# Patient Record
Sex: Female | Born: 1950 | Race: White | Hispanic: No | Marital: Married | Smoking: Never smoker
Health system: Southern US, Community
[De-identification: ages and names within clinical notes are randomized; demographics above are authoritative.]

## PROBLEM LIST (undated history)

## (undated) DIAGNOSIS — I4891 Unspecified atrial fibrillation: Secondary | ICD-10-CM

## (undated) DIAGNOSIS — I10 Essential (primary) hypertension: Secondary | ICD-10-CM

## (undated) DIAGNOSIS — I639 Cerebral infarction, unspecified: Secondary | ICD-10-CM

## (undated) DIAGNOSIS — C55 Malignant neoplasm of uterus, part unspecified: Secondary | ICD-10-CM

## (undated) DIAGNOSIS — E0590 Thyrotoxicosis, unspecified without thyrotoxic crisis or storm: Secondary | ICD-10-CM

## (undated) HISTORY — PX: CHOLECYSTECTOMY: SHX55

## (undated) HISTORY — PX: ABDOMINAL HYSTERECTOMY: SHX81

## (undated) HISTORY — DX: Thyrotoxicosis, unspecified without thyrotoxic crisis or storm: E05.90

## (undated) HISTORY — DX: Cerebral infarction, unspecified: I63.9

## (undated) HISTORY — PX: REPLACEMENT TOTAL KNEE BILATERAL: SUR1225

## (undated) SURGICAL SUPPLY — 1 items: CATH 8FR REPROCESSED SOUNDSTAR (CATHETERS) ×1 IMPLANT

---

## 2003-12-22 ENCOUNTER — Inpatient Hospital Stay (HOSPITAL_COMMUNITY): Admission: RE | Admit: 2003-12-22 | Discharge: 2003-12-26 | Payer: Self-pay | Admitting: Orthopedic Surgery

## 2004-07-14 ENCOUNTER — Encounter (INDEPENDENT_AMBULATORY_CARE_PROVIDER_SITE_OTHER): Payer: Self-pay | Admitting: Specialist

## 2004-07-14 ENCOUNTER — Ambulatory Visit (HOSPITAL_COMMUNITY): Admission: RE | Admit: 2004-07-14 | Discharge: 2004-07-15 | Payer: Self-pay | Admitting: Surgery

## 2004-07-27 ENCOUNTER — Inpatient Hospital Stay (HOSPITAL_COMMUNITY): Admission: EM | Admit: 2004-07-27 | Discharge: 2004-07-28 | Payer: Self-pay | Admitting: Emergency Medicine

## 2004-08-02 ENCOUNTER — Ambulatory Visit: Payer: Self-pay | Admitting: Internal Medicine

## 2004-08-14 ENCOUNTER — Ambulatory Visit: Payer: Self-pay | Admitting: Internal Medicine

## 2004-08-16 ENCOUNTER — Ambulatory Visit: Payer: Self-pay | Admitting: Endocrinology

## 2004-08-21 ENCOUNTER — Ambulatory Visit: Payer: Self-pay | Admitting: Internal Medicine

## 2004-08-30 ENCOUNTER — Ambulatory Visit: Payer: Self-pay

## 2004-09-12 ENCOUNTER — Ambulatory Visit: Payer: Self-pay | Admitting: Internal Medicine

## 2004-10-06 ENCOUNTER — Encounter: Admission: RE | Admit: 2004-10-06 | Discharge: 2004-10-06 | Payer: Self-pay | Admitting: Internal Medicine

## 2004-10-16 ENCOUNTER — Ambulatory Visit: Payer: Self-pay | Admitting: Internal Medicine

## 2004-10-18 ENCOUNTER — Ambulatory Visit: Payer: Self-pay | Admitting: Internal Medicine

## 2004-11-08 ENCOUNTER — Inpatient Hospital Stay (HOSPITAL_COMMUNITY): Admission: RE | Admit: 2004-11-08 | Discharge: 2004-11-12 | Payer: Self-pay | Admitting: Orthopedic Surgery

## 2004-12-27 ENCOUNTER — Ambulatory Visit: Payer: Self-pay | Admitting: Internal Medicine

## 2005-07-18 ENCOUNTER — Ambulatory Visit: Payer: Self-pay | Admitting: Internal Medicine

## 2005-11-20 ENCOUNTER — Ambulatory Visit: Payer: Self-pay | Admitting: Internal Medicine

## 2005-11-26 ENCOUNTER — Encounter (INDEPENDENT_AMBULATORY_CARE_PROVIDER_SITE_OTHER): Payer: Self-pay | Admitting: Specialist

## 2005-11-26 ENCOUNTER — Inpatient Hospital Stay (HOSPITAL_COMMUNITY): Admission: RE | Admit: 2005-11-26 | Discharge: 2005-11-30 | Payer: Self-pay | Admitting: Orthopedic Surgery

## 2006-06-27 ENCOUNTER — Ambulatory Visit: Payer: Self-pay | Admitting: Internal Medicine

## 2006-08-09 ENCOUNTER — Ambulatory Visit (HOSPITAL_COMMUNITY): Admission: RE | Admit: 2006-08-09 | Discharge: 2006-08-09 | Payer: Self-pay | Admitting: Internal Medicine

## 2006-08-28 ENCOUNTER — Ambulatory Visit: Payer: Self-pay | Admitting: Internal Medicine

## 2006-10-28 ENCOUNTER — Inpatient Hospital Stay (HOSPITAL_COMMUNITY): Admission: RE | Admit: 2006-10-28 | Discharge: 2006-10-31 | Payer: Self-pay | Admitting: Orthopedic Surgery

## 2006-10-28 ENCOUNTER — Encounter (INDEPENDENT_AMBULATORY_CARE_PROVIDER_SITE_OTHER): Payer: Self-pay | Admitting: Specialist

## 2007-02-13 ENCOUNTER — Ambulatory Visit: Payer: Self-pay | Admitting: Internal Medicine

## 2007-02-13 LAB — CONVERTED CEMR LAB
Free T4: 0.9 ng/dL (ref 0.6–1.6)
T3 Uptake Ratio: 37 % (ref 22.5–37.0)
T3, Free: 2.8 pg/mL (ref 2.3–4.2)
T4, Total: 7.9 ug/dL (ref 5.0–12.5)

## 2007-03-31 ENCOUNTER — Telehealth: Payer: Self-pay | Admitting: *Deleted

## 2007-05-16 DIAGNOSIS — M1990 Unspecified osteoarthritis, unspecified site: Secondary | ICD-10-CM | POA: Insufficient documentation

## 2007-05-16 DIAGNOSIS — I10 Essential (primary) hypertension: Secondary | ICD-10-CM | POA: Insufficient documentation

## 2007-05-16 DIAGNOSIS — Z8639 Personal history of other endocrine, nutritional and metabolic disease: Secondary | ICD-10-CM

## 2007-08-06 ENCOUNTER — Ambulatory Visit: Payer: Self-pay | Admitting: Internal Medicine

## 2007-08-06 LAB — CONVERTED CEMR LAB
CA 125: 12.3 units/mL (ref 0.0–30.2)
CEA: 0.8 ng/mL (ref 0.0–5.0)

## 2007-08-11 ENCOUNTER — Encounter: Payer: Self-pay | Admitting: *Deleted

## 2007-08-11 DIAGNOSIS — C549 Malignant neoplasm of corpus uteri, unspecified: Secondary | ICD-10-CM | POA: Insufficient documentation

## 2007-08-15 ENCOUNTER — Ambulatory Visit: Payer: Self-pay | Admitting: Internal Medicine

## 2007-08-15 LAB — CONVERTED CEMR LAB
BUN: 24 mg/dL — ABNORMAL HIGH (ref 6–23)
Creatinine, Ser: 1.3 mg/dL — ABNORMAL HIGH (ref 0.4–1.2)

## 2007-08-18 ENCOUNTER — Ambulatory Visit: Payer: Self-pay | Admitting: Cardiology

## 2007-08-20 ENCOUNTER — Ambulatory Visit: Admission: RE | Admit: 2007-08-20 | Discharge: 2007-08-20 | Payer: Self-pay | Admitting: Gynecologic Oncology

## 2007-08-22 ENCOUNTER — Ambulatory Visit: Payer: Self-pay | Admitting: Internal Medicine

## 2007-09-09 ENCOUNTER — Encounter (INDEPENDENT_AMBULATORY_CARE_PROVIDER_SITE_OTHER): Payer: Self-pay | Admitting: Gynecologic Oncology

## 2007-09-09 ENCOUNTER — Inpatient Hospital Stay (HOSPITAL_COMMUNITY): Admission: RE | Admit: 2007-09-09 | Discharge: 2007-09-11 | Payer: Self-pay | Admitting: Obstetrics and Gynecology

## 2007-10-29 ENCOUNTER — Ambulatory Visit: Admission: RE | Admit: 2007-10-29 | Discharge: 2007-10-29 | Payer: Self-pay | Admitting: Gynecologic Oncology

## 2008-02-11 ENCOUNTER — Ambulatory Visit: Admission: RE | Admit: 2008-02-11 | Discharge: 2008-02-11 | Payer: Self-pay | Admitting: Gynecologic Oncology

## 2008-02-11 ENCOUNTER — Other Ambulatory Visit: Admission: RE | Admit: 2008-02-11 | Discharge: 2008-02-11 | Payer: Self-pay | Admitting: Gynecologic Oncology

## 2008-02-11 ENCOUNTER — Encounter: Payer: Self-pay | Admitting: Gynecologic Oncology

## 2010-10-21 ENCOUNTER — Encounter: Payer: Self-pay | Admitting: Internal Medicine

## 2011-02-13 NOTE — Consult Note (Signed)
NAME:  Melissa, [name]             ACCOUNT NO.:  [id number]   MEDICAL RECORD NO.:  [id number]          PATIENT TYPE:  OUT   LOCATION:  GYN                          FACILITY:  [facility]   PHYSICIAN:  Paola A. Duard Brady, MD    DATE OF BIRTH:  01-Mar-1951   DATE OF CONSULTATION:  10/29/2007  DATE OF DISCHARGE:                                 CONSULTATION   REFERRING PHYSICIAN:  Paola A. Duard Brady, M.D.   The patient is a 60 year old gravida 1, para 1 who really never stopped  having her period.  She had 36 hours of heavy bleeding with clots in  September 2008.  She underwent an endometrial biopsy by Dr. Henderson Cloud that  revealed a grade 1 endometrioid adenocarcinoma.  On September 09, 2007 she  underwent diagnostic laparoscopy which was converted to exploratory  laparotomy as the patient could not tolerate deep Trendelenburg.  She  underwent a TAH-BSO and bilateral selective pelvic lymphadenectomy.  Frozen section revealed a grade 1 endometrioid adenocarcinoma with  minimal to no myometrial invasion.  Due to those findings and the  patient's obesity, it was elected to not perform a pelvic lymph node  dissection.  Final pathology was consistent with a superficially  invasive 5 cm grade 1 endometrioid adenocarcinoma with 0.2 cm out of 2.3  cm, or less than 10% myometrial invasion with no lymphovascular space  involvement.  Her cervix and bilateral tubes and ovaries were negative,  0/21 lymph nodes were involved, and her washings were negative.  She  comes in for her postoperative check.   She has been back at work for the past 3 weeks.  She did have a little  bit of vaginal drainage and discharge which was slightly pink but that  has now resolved.  She has no abdominal pain and is otherwise feeling  quite well.   PHYSICAL EXAMINATION:  Weight 270 pounds, well-nourished, well-developed  female in no acute distress.  ABDOMEN:  Shows well-healed vertical skin incision.  Abdomen is soft,  nontender,  nondistended, there are no palpable masses or  hepatosplenomegaly, though exam is limited by habitus.  PELVIC:  External genitalia is within normal limits.  The vagina is  slightly atrophic.  The vaginal cuff is visualized, somewhat limited due  to habitus, there are no gross visible lesions.  The vaginal cuff looks  well healed.  BIMANUAL EXAMINATION:  The vaginal cuff is nontender.   ASSESSMENT:  A 60 year old stage IB grade 1 endometrioid adenocarcinoma.  Preoperatively she had papillary features.  There was no evidence of a  serous component so she can be dispositioned to close followup.  She  will return to see [place] in 4 months for examination, Pap smear, interval  cancer surveillance.  We will then alternate with Dr. Henderson Cloud every 4  months.      Paola A. Duard Brady, MD  Electronically Signed     PAG/MEDQ  D:  10/29/2007  T:  10/30/2007  Job:  [id number]   cc:   Carrington Clamp, M.D.  Fax: [phone]   Stacie Glaze, MD  [address] [place]  [place]  [address]   Telford Nab, R.N.  [address]  [place], [place] [address]

## 2011-02-13 NOTE — Consult Note (Signed)
NAME:  Melissa [name], Melissa [name]             ACCOUNT NO.:  [id number]   MEDICAL RECORD NO.:  [id number]          PATIENT TYPE:  OUT   LOCATION:  GYN                          FACILITY:  [facility]   PHYSICIAN:  Paola A. Duard Brady, MD    DATE OF BIRTH:  July 13, 1951   DATE OF CONSULTATION:  02/11/2008  DATE OF DISCHARGE:                                 CONSULTATION   Melissa [name] is a very pleasant 60 year old gravida 1, para 1, who never  really went through menopause.  She had 36 hours of heavy bleeding in  September of 2008 that prompted an endometrial biopsy by Dr. Henderson [name]  that revealed a grade 1 endometrioid adenocarcinoma.  In December of  2008, she underwent laparoscopy which converted to exploratory  laparotomy, TAH/BSO and bilateral selective pelvic lymphadenectomy.  Final pathology was consistent with a superficially invasive grade 1  endometrioid adenocarcinoma with less than 10% myometrial invasion.  No  lymphovascular space involvement.  Cervix, adnexa washings were  negative.  Zero out of 21 lymph nodes were involved.  I saw her for a  postoperative check in January, and she comes in today for a follow up.  She is overall doing very well.  She is attempting weight loss efforts  with [employer].  She does not wish to have her weight done today.  She does occasionally have some twinging pains which happen about once a  month.  She never has them at night.  They are fleeting.  She denies any  significant change in her bowel habit.  She occasionally has some  episodes of stress urinary incontinence.  It sounds like if she will  empty her bladder may not empty it completely, then she gets up and  moves around and may dribble a small amount of urine.  She is attempting  to lose weight and thinks that that will help.  She has had both knees  replaced twice and that is limiting her ability to exercise, but she is  feeling better and better at this time.   MEDICATIONS:  Medication list reviewed  and is unchanged.   HEALTH MAINTENANCE:  She is up-to-date on her mammograms.   PHYSICAL EXAMINATION:  GENERAL:  Well-nourished, well-developed female  in no acute distress.  NECK:  Supple.  There is no lymphadenopathy, no thyromegaly.  LUNGS:  Clear to auscultation bilaterally.  CARDIOVASCULAR:  Regular rate and rhythm.  ABDOMEN:  A well-healed vertical midline skin incision.  Abdomen is  soft, nontender, nondistended.  There are no palpable masses or  hepatosplenomegaly.  There are no incisional hernias.  Groins are  negative for adenopathy.  EXTREMITIES:  She has got well-healed surgical incisions over bilateral  knees.  She has an area of a bug bite on her left anterior shin.  She  has 1+ nonpitting edema on the left leg greater than the right.  PELVIC:  External genitalia is within normal limits.  The vagina is  atrophic.  The vaginal cuff is visualized.  There are no visible  lesions.  ThinPrep Pap was submitted without difficulty.  Bimanual  examination was limited secondary  to habitus; however, there are no  masses or nodularity.  RECTAL:  Confirms she had significant perianal hemorrhoids.   ASSESSMENT:  A 60 year old with a stage IB, grade 1 endometrioid  adenocarcinoma who clinically has no evidence of recurrent disease.  Plan to follow up the results for Pap smear from today.  She will see  Dr. Henderson [name] in 4  months and return to see [place] in 8 months.  At that point, it will be her  1-year anniversary.  We can alternate to q6 month visits.  She knows to  contact us should she have any issues, particularly bleeding.  She was  given precautions and indications to contact us prior to her next visit.      Paola A. Duard Brady, MD  Electronically Signed     PAG/MEDQ  D:  02/11/2008  T:  02/11/2008  Job:  [id number]   cc:   Carrington Clamp, M.D.  Fax: [phone]   Stacie Glaze, MD  [address] [place]  [place] [address]   Telford Nab, R.N.  [address]  [place], [place] [address]

## 2011-02-13 NOTE — Consult Note (Signed)
NAME:  Melissa [name], Melissa [name]             ACCOUNT NO.:  [id number]   MEDICAL RECORD NO.:  [id number]          PATIENT TYPE:  OUT   LOCATION:  GYN                          FACILITY:  [facility]   PHYSICIAN:  Paola A. Duard Brady, MD    DATE OF BIRTH:  Aug 30, 1951   DATE OF CONSULTATION:  08/20/2007  DATE OF DISCHARGE:                                 CONSULTATION   Patient is seen today in consultation at the request of Dr. Henderson Cloud.  Melissa [name] is a 60 year old gravida 1, para 1 who has really never  stopped bleeding and has never fully gone to menopause.  In September  2008, she had about 36 hours of heavy bleeding and clots which prompted  a visit to see Dr. Henderson Cloud.  At that time, evaluation was performed.  Ultrasound revealed a thickened endometrial stripe, questionable polyp  and 2 simple cysts.  The right ovary measured 4 x 4 x 3.5 cm with a  simple cyst, and the left ovary measured 2 x 2 with a small  approximately 7-cm uterus.  Endometrial biopsy was performed November 5.  It revealed a grade 1 endometrioid adenocarcinoma.  There are few foci  of papillary architecture and a focal serous component could not be  ruled out.  For this reason, she was referred to [place] for further  evaluation.  CA-125 was performed at 12.3 and a CEA of 0.8.  The patient  did have a CT scan of the abdomen and pelvis ordered by her primary  physician.  Within the pelvis, there was a right adnexal cystic lesion  measuring 2.8 x 3.2 cm.  The uterus and left ovary were visualized and  were normal in appearance.  There was no enlarged lymphadenopathy.  The  liver and upper abdomen were unremarkable.  Since the episode of heavy  bleeding with clot, she has had some bleeding but no further clots.  She  does feel that she might be passing some tissue.  For about the past  week or so, she has had some lower abdominal pain and cramping that  happens about 9:30 to 10:30 every day.  She states this has been since  she has found  out about biopsy results.  The pain does decrease with  Tylenol.  She denies any significant change in her bowel or bladder  habits.  She denies any chest pain or shortness of breath.   PAST MEDICAL HISTORY:  Significant hypertension and osteoarthritis.   MEDICATIONS:  Micardis 80/25 one p.o. daily, atenolol 75 mg in the  morning 50 mg in the evening.   She has no known drug allergies.   PAST SURGICAL HISTORY:  She has had bilateral knee replacements done  twice in the past 4 years.  She had a laparoscopic cholecystectomy in  2005.   FAMILY HISTORY:  She has sisters and mother's both with hypertension.  She has a 49 year old sister who just recently undergone surgery,  radiation and chemotherapy for papillary serous carcinoma of the  endometrium in [place].   SOCIAL HISTORY:  She denies the use tobacco or alcohol.  She works as a  nurse.  She is married.   HEALTH MAINTENANCE:  She is up-to-date on her mammogram.  She never had  a colonoscopy but does fecal occult blood testing and they have been  negative.   PHYSICAL EXAMINATION:  VITAL SIGNS:  Weight 272 pounds, height 5 feet  10.  GENERAL:  Well-nourished, well-developed female in no acute distress.  NECK:  Supple.  There is no lymphadenopathy, no thyromegaly.  LUNGS:  Clear to auscultation bilaterally.  CARDIOVASCULAR EXAM:  Regular rate and rhythm.  ABDOMEN:  Morbidly obese, soft, nontender, nondistended.  There are no  palpable masses, but exam is limited secondary to morbid obesity.  She  does have a moderate pannus.  Groins are negative for adenopathy.  EXTREMITIES:  There is no edema.  She has well-healed surgical incisions  along the knees.  PELVIC:  External genitalia is within normal limits.  The vagina is  somewhat atrophic.  The cervix is visualized.  There is no gross visible  lesions.  There is no menstrual bleeding.  Bimanual examination is  limited secondary to obesity.  The corpus does not appear  appreciably  enlarged.  I could not appreciate any adnexal masses.   ASSESSMENT:  A 60 year old with an endometrioid adenocarcinoma on path  report described as well-differentiated, but they cannot rule out  papillary architecture versus a component of serous papillary carcinoma.  She has negative imaging and normal CA-125.   PLAN:  1. I will discuss with the patient and her husband the need for      surgical intervention.  She does have a preoperative workup with      her primary physicians at [facility] this coming Friday.  We will need      to coordinate surgical date with our office as well as Dr.      Kittie Plater office.  I think an attempt could be made for      laparoscopic surgery, but it may be fairly limited secondary to the      patient's obesity and the distribution of her body weight.  We will      discuss the option of needing to proceed with an exploratory      laparotomy for optimal staging.  Postoperative treatment will      depend on the results of her surgical staging and final histology,      particularly if there is a component of a serous carcinoma.  2. Risks, benefits of surgery were discussed with the patient and they      included, but were not limited to, bleeding, infection, injury to      surrounding organs, thromboembolic disease, and of course need for      adjuvant therapy.  These risks were discussed with the patient and      her husband.  They understand and wish to proceed.  They will be      contacted with the first available surgical date here in      [place].      Paola A. Duard Brady, MD  Electronically Signed     PAG/MEDQ  D:  08/20/2007  T:  08/20/2007  Job:  [id number]   cc:   Carrington Clamp, M.D.  Fax: [phone]   Stacie Glaze, MD  [address] [place]  [place] [address]   Telford Nab, R.N.  [address]  [place], [place] [address]

## 2011-02-13 NOTE — Discharge Summary (Signed)
NAME:  Melissa [name], Melissa [name]             ACCOUNT NO.:  [id number]   MEDICAL RECORD NO.:  [id number]          PATIENT TYPE:  INP   LOCATION:  1522                         FACILITY:  [facility]   PHYSICIAN:  Carrington Clamp, M.D. DATE OF BIRTH:  12-11-1950   DATE OF ADMISSION:  09/09/2007  DATE OF DISCHARGE:  09/11/2007                               DISCHARGE SUMMARY   ADMISSION DIAGNOSIS:  Endometrial cancer.   DISCHARGE DIAGNOSIS:  Endometrial cancer stage I.   PERTINENT PROCEDURES PERFORMED:  Total abdominal hysterectomy with  bilateral salpingo-oophorectomy and staging and pelvic lymph node  dissection.   PERTINENT TEST RESULTS:  A postop H&H of 10 and 20.   HISTORY AND PHYSICAL:  Please refer to dictated history and physical on  the chart from Dr. Cleda Mccreedy.  Briefly, this is a 60 year old G1, P1  who had been having regular periods and found to have low-grade  endometrial cancer on endometrial biopsy.  The patient was admitted on  September 09, 2007 for the above named procedures and underwent them  without complications.  By postoperative day #2, she was eating,  ambulating, voiding without complication and was discharged with the  following:   DISCHARGE MEDICATIONS:  Pain medication:  Percocet 1 p.o. q. 4-6 hours p.r.n. pain.   DISCHARGE DIET:  Increased water and fiber.   DISCHARGE ACTIVITIES:  Increase activity slowly.  No lifting for 6  weeks, no driving for 2 weeks.  No sexual activity for 6 weeks.  May  shower but no bath.   FOLLOWUP:  In 1 week for staples out.      Carrington Clamp, M.D.  Electronically Signed     MH/MEDQ  D:  10/15/2007  T:  10/15/2007  Job:  [id number]

## 2011-02-13 NOTE — Op Note (Signed)
NAME:  Melissa [name], Melissa [name]             ACCOUNT NO.:  [id number]   MEDICAL RECORD NO.:  [id number]          PATIENT TYPE:  INP   LOCATION:  0008                         FACILITY:  [facility]   PHYSICIAN:  John T. Kyla Balzarine, M.D.    DATE OF BIRTH:  02-05-1951   DATE OF PROCEDURE:  09/09/2007  DATE OF DISCHARGE:                               OPERATIVE REPORT   PREOPERATIVE DIAGNOSIS:  Clinical stage I endometrial cancer.   POSTOPERATIVE DIAGNOSIS:  Clinical stage I endometrial cancer, with  minimal invasion and low-grade histology on frozen section.   PROCEDURE:  Laparoscopy with conversion to total abdominal hysterectomy  with bilateral salpingo-oophorectomy, bilateral pelvic lymphadenopathy.   SURGEON:  John T. Kyla Balzarine, M.D.   ASSISTANT:  Carrington Clamp, M.D.   ANESTHESIA:  General endotracheal.   DESCRIPTION OF FINDINGS AND INDICATIONS FOR SURGERY:  This 60 year old  patient presented with an endometrial biopsy diagnosis of grade 1  endometrioid adenocarcinoma. She is morbidly obese with weight greater  than 270 pounds. Examination of anesthesia confirmed no cervical gross  involvement or parametrial nodularity. After the ZUMI manipulator and co-  ring were placed, the peritoneal cavity was accessed using the OptiVu  direct placement technique through a vertical periumbilical incision.  Unfortunately, the patient was unable to tolerate steep Trendelenburg,  and with the extent of Trendelenburg that could be accomplished, on a  small portion of the uterine fundus could be visualized. It was elected  to open the patient. TAH/BSO with bilateral selective pelvic  lymphadenectomy was performed. Frozen section of the uterine specimen  revealed a grade 1 endometrioid adenocarcinoma with minimal to no  myometrial invasion. On the basis of these findings and the patient's  obesity, we elected not to perform aortic node dissection.   DESCRIPTION OF PROCEDURE:  The patient was prepped and draped in  the low  lithotomy position after placement in the [facility] stirrups while the  patient was awake. Both arms were tucked in the military position with  appropriate padding and shoulder blocks placed. Foley catheter was  sterilely placed. The peritoneal cavity was accessed through a left  periumbilical incision using the direct insertion technique with an  OptiVu 10-mm scope. Pneumoperitoneum was established, and there was no  obvious evidence of metastatic disease in the upper abdomen. The patient  was placed in Trendelenburg. However, she was unable to be ventilated in  more than moderate Trendelenburg, and we were only able to expose the  fundus of the uterus. Therefore, we elected to perform an open  procedure.   The pneumoperitoneum was deflated, and a midline incision was carried  through skin and subcutaneous adipose tissue with a scalpel,  incorporating the left periumbilical incision. The fascia was incised  and abdominal wall elevated. The peritoneum was entered sharply and the  incision extended vertically with electrocautery. Bowel was manually and  visually inspected with no evidence of metastatic disease. Washings were  obtained from the pelvis with normal saline. Bookwalter retractor was  placed, and the bowel packed out of the pelvis. At this time, the co-  ring and the ZUMI uterine manipulator were removed.  Total abdominal hysterectomy with bilateral salpingo-oophorectomy was  performed using sharp and blunt dissection with electrocautery for  hemostasis. The infundibulopelvic ligaments were cauterized and divided  and using the harmonic scalpel after partially opening the perirectal  spaces and identifying the ureter below the IP ligaments. The medial  leaf of the broad ligament was opened and ovaries elevated. The anterior  peritoneal reflection of the uterus was opened and the uterine arteries  skeletonized bilaterally. These were cross clamped, divided and suture   ligated with all sutures 2-0 Vicryl unless other specified. Alternating  on the right and left side, additional pedicles were developed by  clamping adjacent uterus and cervix, dividing and suture ligating. The  final pedicles entered the vaginal fornices, and the uterine specimen  was amputated and handed off for frozen section histology. The vaginal  cuff was closed with interrupted and locked running figure-of-eight  suture of 0 Vicryl. Pelvis was irrigated.   Bilateral pelvic lymphadenectomies were performed in the following  manner:  Perirectal and perivesical spaces were completely developed  with sharp and blunt dissection. All lymph nodes and adipose tissue  anterior and medial to the external iliac vessels were removed using  sharp and blunt dissection from the region of the bifurcation of the  common iliac artery to the deep circumflex iliac vein. The dissection  continued posteriorly on each side into the obturator space. All  obturator lymph nodes medial to the psoas muscle and anterior to the  obturator nerve were removed in a similar fashion. Obturator nerves were  identified and preserved throughout. The operative sites were copiously  irrigated and additional hemostasis achieved where needed with  electrocautery. Sponges and retractors were removed. Then, the abdominal  wall was closed using a running masked closure of 0 PDS to close fascia,  rectus muscles and peritoneum, running 3-0 Vicryl to close the  subcutaneous adipose tissue and clips to close the skin, irrigating  between layers. The patient tolerated the procedure well and was  returned to the recovery room in stable condition.   ESTIMATED BLOOD LOSS:  400 mL.   TRANSFUSIONS:  None.   DRAINS, PACKS, ETC.:  Foley to independent drainage.   Sponge and instrument counts correct x2.   PATHOLOGY SPECIMENS:  Peritoneal washings for cytology, uterus with  tubes and ovaries for frozen section histology, bilateral  pelvic lymph  nodes for permanent pathology.      John T. Kyla Balzarine, M.D.  Electronically Signed     JTS/MEDQ  D:  09/09/2007  T:  09/09/2007  Job:  [id number]   cc:   Carrington Clamp, M.D.  Fax: [phone]   Stacie Glaze, MD  [address] [place]  [place] [address]   Telford Nab, R.N.  [address]  [place], [place] [address]

## 2011-02-16 NOTE — Op Note (Signed)
NAME:  Melissa [name], Melissa [name]             ACCOUNT NO.:  [id number]   MEDICAL RECORD NO.:  [id number]          PATIENT TYPE:  OIB   LOCATION:  2899                         FACILITY:  [facility]   PHYSICIAN:  Thornton Park. Daphine Deutscher, M.D.DATE OF BIRTH:  11/04/1950   DATE OF PROCEDURE:  07/14/2004  DATE OF DISCHARGE:                                 OPERATIVE REPORT   PREOPERATIVE DIAGNOSIS:  Chronic cholecystitis and cholelithiasis, possible  stone in common duct.   POSTOPERATIVE DIAGNOSIS:  Markedly shrunken gallbladder with stones and  marked chronic chronic cholecystitis, normal interoperative cholangiogram  except slight dilatation of intrahepatic ducts with nice tapering of the  distal duct and free flow into the duodenum.   PROCEDURE:  Laparoscopic cholecystectomy with interoperative cholangiogram.   SURGEON:  Thornton Park. Daphine Deutscher, M.D.   ASSISTANT:  Vikki Ports, M.D.   DESCRIPTION OF PROCEDURE:  [name] is a 60 year old lady taken to  room 16 at [facility] on October 14 and given general anesthesia.  The abdomen was  prepped with Betadine and draped sterilely.  A longitudinal incision was  made down to the umbilicus through which I was able to enter the abdomen and  insert the Hasson cannula without difficulty.  The abdomen was insufflated.  The other ports were injected with Marcaine initially and the Hasson site  injected again.  After these were placed, the gallbladder could not be  visualized as it was walled off.  I used sharp dissection to free up the  omental adhesions that were stuck to the gallbladder and then I grasped the  gallbladder, elevated it, and took sharp dissection to take it down and free  it up.  It was short, intrahepatic, and had some fatty infiltration about  it, as well.  I incised the fat overlying what appeared to be the cystic  duct and dissected these free.  I then identified the junction between the  gallbladder and the cystic duct and then saw the  cystic duct going down  toward the common duct.  I put a clip on the cystic duct and on the cystic  artery and took a cholangiogram after making an incision in the cystic duct  and putting a Reddick catheter in it.  This showed good filling of the  common bile duct with free flow into the duodenum.  The proximal proper  hepatic duct was somewhat more plump than the distal duct.  After triply  clipping the cystic duct and dividing it, the cystic artery was triply  clipped and divided and the gallbladder was removed from the gallbladder bed  without entering it, taking right along the gallbladder which was quite  sclerotic and, again, had a lot of fatty infiltration about it.  Hemostasis  was achieved in the gallbladder bed and the gallbladder was placed in a bag  and brought out through the umbilicus without  difficulty.  The umbilical defect was repaired with three sutures of 0  Vicryl.  The ports were all injected and then the skin was closed with 4-0  Vicryl, Benzoin, and Steri-Strips.  The patient tolerated the procedure well  and was taken to the recovery room in satisfactory condition.       MBM/MEDQ  D:  07/14/2004  T:  07/14/2004  Job:  [id number]   cc:   Stacie Glaze, M.D. [facility]

## 2011-02-16 NOTE — H&P (Signed)
NAME:  Melissa [name], Melissa [name]             ACCOUNT NO.:  [id number]   MEDICAL RECORD NO.:  [id number]          PATIENT TYPE:  INP   LOCATION:  NA                           FACILITY:  [facility]   PHYSICIAN:  Ollen Gross, M.D.    DATE OF BIRTH:  1951-01-18   DATE OF ADMISSION:  11/08/2004  DATE OF DISCHARGE:                                HISTORY & PHYSICAL   CHIEF COMPLAINT:  Left knee pain.   HISTORY OF PRESENT ILLNESS:  The patient is a 60 year old female seen by Dr.  Homero Fellers Aluisio for ongoing left knee pain. She has previously undergone a  right total knee arthroplasty and has done well with her right knee. She is  known to have significant arthritis with the left knee which has been  progressive in nature. She has reached a point where she would like to have  something done about it. Risks and benefits have been discussed with the  patient who elects to proceed with surgery.   ALLERGIES:  No known drug allergies.   CURRENT MEDICATIONS:  1.  Toprol XL 100 mg.  2.  Micardis 80/25 mg q.d.  3.  Tapazole 10 mg b.i.d.  4.  Tylenol p.r.n.  5.  Voltaren stop prior to surgery.   PAST MEDICAL HISTORY:  1.  Past history of MRSA infection.  2.  Hypertension.  3.  Osteoarthritis.  4.  Postmenopausal.  5.  Hyperthyroidism.   PAST SURGICAL HISTORY:  1.  Right total knee arthroplasty.  2.  Cholecystectomy.   FAMILY HISTORY:  Mother with a history of hypertension. She also has two  sisters with hypertension and one sister with rheumatoid arthritis.   SOCIAL HISTORY:  Married.  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  Nonsmoker. Social intake of alcohol. Has one child. Her husband will  assisting with care after surgery.   REVIEW OF SYMPTOMS:  GENERAL:  She has had some slight weight loss recently.  No fever, chills, night sweats. NEUROLOGICAL:  Recent sinus congestion which  has improved. No seizures, syncope, or paralysis. RESPIRATORY:  No shortness  of breath, productive cough, or  hemoptysis. CARDIOVASCULAR:  No chest pain,  angina, or orthopnea. GASTROINTESTINAL:  No nausea, vomiting, diarrhea, or  constipation. GENITOURINARY:  No dysuria, hematuria, or discharge.  MUSCULOSKELETAL:  Left knee found in the history of present illness.   PHYSICAL EXAMINATION:  VITAL SIGNS:  Pulse 80, respirations 12, blood  pressure 152/70.  GENERAL:  A 60 year old white female well-developed, well-nourished, in no  acute distress. She is alert, oriented and cooperative. Very pleasant at the  time of exam. Appears to be an excellent historian.  HEENT:  Normocephalic and atraumatic. Pupils are round and reactive.  Oropharynx clear. EOMs intact.  NECK:  Supple. No carotid bruits are appreciated.  CHEST:  Clear anterior and posterior chest wall. No rhonchi, rales, or  wheezing.  HEART:  Regular rate and rhythm without murmurs.  ABDOMEN:  Soft and nontender. Bowel sounds are present.  RECTAL:  Not done, not pertinent to present illness.  BREASTS:  Not done, not pertinent to present illness.  GENITALIA:  Not done, not pertinent to present illness.  EXTREMITIES:  Left knee shows a significant varus deformity malalignment.  Range of motion of 10 up to 110 degrees. Marked crepitus is noted. She is  moderately tender medially.   IMPRESSION:  1.  Osteoarthritis left knee.  2.  History of methicillin-resistant Staphylococcus positive infection.  3.  Hypertension.  4.  Hyperthyroidism.  5.  Osteoarthritis.  6.  Postmenopausal.  7.  Status post right total knee arthroplasty.   PLAN:  The patient will be admitted to [facility] to undergo a  left total knee arthroplasty, computer navigation assisted with her MRSA  infection. It has been discussed that we may use antibiotics in the cement.  Dr. Valetta Mole. Swords is her medical physician. He will be notified on the  room on admission and will be consulted if needed for any medical assistance  for the patient throughout the hospital  course. It was noted that  preoperatively she did have urinary tract infection. She was treated with  Septra DS prior to her surgery.      ALP/MEDQ  D:  11/07/2004  T:  11/07/2004  Job:  [id number]   cc:   Valetta Mole. Swords, M.D. [facility]   Ollen Gross, M.D.  [facility]  [address]  [address]  [place]  [place] [address]  Fax: [phone]

## 2011-02-16 NOTE — Op Note (Signed)
NAME:  Melissa [name], Melissa [name] NO.:  [id number]   MEDICAL RECORD NO.:  [id number]         PATIENT TYPE:  LINP   LOCATION:                               FACILITY:  [facility]   PHYSICIAN:  Ollen Gross, M.D.    DATE OF BIRTH:  January 22, 1951   DATE OF PROCEDURE:  10/28/2006  DATE OF DISCHARGE:                               OPERATIVE REPORT   PREOPERATIVE DIAGNOSIS:  Loose tibial component left total knee.   POSTOP DIAGNOSIS:  Loose tibial component left total knee.   PROCEDURE:  Left foot tibial revision.   SURGEON:  Ollen Gross, M.D.   ASSISTANT:  Alexzandrew L. Perkins, P.A.-C   ANESTHESIA:  General with postop Marcaine pain pump.   ESTIMATED BLOOD LOSS:  Minimal.   DRAIN:  Hemovac x 1.   TOURNIQUET TIME:  65 minutes at 300 mmHg.   COMPLICATIONS:  None.   CONDITION:  Stable to recovery.   BRIEF CLINICAL NOTE:  Melissa [name] is a 60 year old female who has had  multiple problems with regards to her knees.  She had an uncomplicated  total knee arthroplasty on the left; but then unfortunately has gone to  develop a stress fracture medially; and then the component has subsided  and loosened.  She presents now for left tibial revision.   PROCEDURE IN DETAIL:  After the successful administration of general  anesthetic, the tourniquet was placed high in the left thigh and the  left lower extremity prepped and draped in the usual sterile fashion.  Extremity was wrapped in Esmarch, knee flexed, tourniquet inflated to  300 mmHg.  A standard midline incision was made with a 10-blade through  subcutaneous tissue to the level of the extensor mechanism.  A fresh  blade was used to make a medial parapatellar arthrotomy.  Soft tissue  over the proximal medial tibia was subperiosteally elevated to the joint  line with the knife; and into the semimembranosus bursa with a Cobb  elevator.  Soft tissue laterally was elevated with attention being paid  to avoid the patellar tendon on  tibial tubercle.  I was able to sublux  the patella.  There was minimal joint fluid; we sent that for Gram stain  which was negative.  The tibia was then subluxed forward; and I removed  the tibial polyethylene.  There was evidence of a fracture of the medial  aspect of the proximal tibia.  A small amount of bone had fragmented  from the main portion of tibia and this was removed.  I was then easily  able to remove the tibial tray; it was grossly loose.  There was a fair  amount of bone loss on the medial side.  We removed all of the fibrous  tissue.  Synovium specimens x2 were sent for frozen section which showed  no acute inflammation.  Once we cleaned up the bone, then I placed the  introducer drill into the tibial canal and thoroughly irrigated the  canal.  I reamed up to 15 mm.  The 15 mm intramedullary cutting guide  was placed; and we did this to remove about 2  mm of bone off the lateral  side.  The resection was made with an oscillating saw.  There was still  a defect medial, but it was a contained defect.  I wanted to use a  proximal sleeve in order to gain more rotational stability.  We broached  for the 29 sleeve.  We then placed a trial together with the 29 sleeve.  A size 4 MVT revision tray with a 13 x 60 stem extension, and a 5 mm  augment laterally, and a 15 mm augment medially.  This actually had a  great fit on the bone surface.  We then placed a trial and a 12.5 to  allow for full extension with excellent balance throughout.   The components were then assembled on the back table with the size 4 MVT  revision tray, 29 sleeve, proximal porous coated, with a 15-mm, size 3  augment medial and a 5-mm, size 3 augment lateral.  I went to a 3 so  that it would taper down to a more normal tibial taper and avoid  overhang of the augment.  There were assembled on the back table; and we  sized for a cement restricter, and a size 4 was most appropriate.  Of  note, the femoral  component and patellar component were intact and  solid.  Then I mixed two batches of cement with 2 grams of vancomycin.  The bone was prepared with pulsatile lavage.  The cement was injected  into the canal and pressurized.  The component was then cemented into  position.  A 12.5 trial insert was placed, and the knee held in full  extension.  There was a tiny bit of varus-valgus placed.  I went to a  15, which allowed for full extension with excellent balance throughout.   Once the cement was hardened, then we put the 15 mm , posterior  stabilized, rotating platform inserted into the tibial tray.  The wound  was thoroughly irrigated with saline solution; and then the extensor  mechanism closed over a Hemovac drain with interrupted #1 PDS.  Flexion  against gravity was 130 degrees.  Tourniquet was released with a total  time of 65 minutes.  Subcu was closed with interrupted 2-0 Vicryl and  the skin with staples.  Catheter for the Marcaine pain pump is placed  and the pump initiated.  A drain was hooked to suction.  A bulky sterile  dressing applied; and she was awakened and transported to recovery in  stable condition.      Ollen Gross, M.D.  Electronically Signed     FA/MEDQ  D:  10/28/2006  T:  10/28/2006  Job:  [id number]

## 2011-02-16 NOTE — Discharge Summary (Signed)
NAME:  Melissa [name], Melissa [name]             ACCOUNT NO.:  [id number]   MEDICAL RECORD NO.:  [id number]          PATIENT TYPE:  INP   LOCATION:  0484                         [facility]ACILITY:  [facility]   PHYSICIAN:  Ollen Gross, M.D.    DATE OF BIRTH:  Aug 07, 1951   DATE OF ADMISSION:  11/08/2004  DATE OF DISCHARGE:  11/12/2004                                 DISCHARGE SUMMARY   ADMITTING DIAGNOSES:  1.  Osteoarthritis, left knee.  2.  History of methicillin-resistant Staphylococcal infection.  3.  Hypertension.  4.  Hyperthyroidism.  5.  Osteoarthritis.  6.  Postmenopausal.  7.  Status post right total knee.  8.  Postoperative blood loss anemia.  9.  Postoperative hyponatremia, improved.  10. Hyperkalemia, improved.  11. Hypokalemia, improved.  12. Preoperative urinary tract infection, treated preoperatively.  13. Blood group type O positive.   DISCHARGE DIAGNOSES:  1.  Osteoarthritis, left knee, status post left total knee arthroplasty,      computer navigation assisted.  2.  History of methicillin-resistant Staphylococcal infection.  3.  Hypertension.  4.  Hyperthyroidism.  5.  Osteoarthritis.  6.  Postmenopausal.  7.  Status post right total knee.  8.  Postoperative blood loss anemia.  9.  Postoperative hyponatremia, improved.  10. Hyperkalemia, improved.  11. Hypokalemia, improved.  12. Preoperative urinary tract infection, treated preoperatively.  13. Blood group type O positive.   An EKG dated 10/18/2004:  Normal sinus rhythm confirmed by Dr. Cato Mulligan.   HOSPITAL COURSE:  The patient was admitted to [facility], taken to  the OR, underwent the above procedure, utilized vancomycin in the cement due  to the previous history of infection.  The patient tolerated the procedure  well, was started on PCA and p.o. analgesics up on the orthopedic floor for  postoperative care.  Hemovac drain pulled on day #1 without difficulty.  She  had a little bit of slight bump in creatinine  from 1.1 preoperatively to  1.5, given small bolus of fluid, especially due to some decreased urinary  output since midnight of the evening of surgery.  Got up out of bed by day  #2.  She did experience some nausea on the evening of day #1 and in the  morning, but that was improved by day #2.  The PCA was discontinued.  The IV  was Hep-Locked.  Hemoglobin was a little bit lower, down to 8.1, felt to be  dilutional.  Sodium was down to 130, and the BMET was rechecked.  The  dressing was changed on day #2.  Incision was healing well.  She did get up  and ambulate approximately 40 feet by day #3.  Hemoglobin had dropped down  to 7.3 that afternoon on day #2, but it was back up to 8.  The anemia looked  like it was improving.  She did have it from postoperative blood loss and  also dilution.  She was asymptomatic with it, placed on trinsicon.  She did  not have any headache, no dyspnea, no shortness of breath, or  lightheadedness.  Her potassium initially went up postoperatively from  4.8  to 5.5, but it dropped back down with all the fluid bolus and dilution.  It  dropped down to 3.1.  It did come back up after supplementation back up to  4.9.  She continued to progress with physical therapy and was ambulating  approximately 80 feet on day #3.  She continued to improve overall and by  day #4, she was feeling better and wanting to go home.  Hemoglobin  stabilized at 7.9.  She was asymptomatic.  Sodium came back up to a normal  limit.  Potassium was back up to a normal limit.  She was tolerating her  medications and was discharged home.   DISCHARGE PLAN:  1.  The patient was discharged home on 11/12/2004.  2.  Discharge diagnoses:  Please see above.  3.  Discharge medications:  Coumadin, Percocet, Robaxin.  4.  Diet:  Resume previous home diet.  5.  Activity:  Total knee protocol, weightbearing as tolerated, home PT,      home health nursing through [facility].  6.  Followup:  Two  weeks from surgery.   DISPOSITION:  Home.   CONDITION UPON DISCHARGE:  Improved.      ALP/MEDQ  D:  12/06/2004  T:  12/06/2004  Job:  [id number]   cc:   Valetta Mole. Swords, M.D. [facility]

## 2011-02-16 NOTE — Op Note (Signed)
NAME:  Melissa, [name]                         ACCOUNT NO.:  [id number]   MEDICAL RECORD NO.:  [id number]                   PATIENT TYPE:  INP   LOCATION:  X002                                 FACILITY:  [facility]   PHYSICIAN:  Ollen Gross, M.D.                 DATE OF BIRTH:  1951/01/26   DATE OF PROCEDURE:  12/22/2003  DATE OF DISCHARGE:                                 OPERATIVE REPORT   PREOPERATIVE DIAGNOSIS:  Osteoarthritis of the right  knee.   POSTOPERATIVE DIAGNOSES:  Osteoarthritis of the right knee.   OPERATION/PROCEDURE:  Right total knee arthroplasty.   SURGEON:  Ollen Gross, M.D.   ASSISTANT:  Alexzandrew L. Perkins, P.A.-C.   ANESTHESIA:  General.  Postoperative pain pump.   ESTIMATED BLOOD LOSS:  Minimal.   DRAINS:  Hemovac x1.   COMPLICATIONS:  None.   TOURNIQUET TIME:  57 minutes at 300 mmHg.   DISPOSITION:  Stable to recovery.   BRIEF CLINICAL NOTE:  Melissa [name] is a 60 year old female with end-stage  osteoarthritis of the right knee with severe varus deformity and pain  refractory to nonoperative management.  She presents now for right total  knee arthroplasty.   DESCRIPTION OF PROCEDURE:  After the successful administration of general  anesthesia, tourniquet was placed high on the right thigh and right lower  extremity prepped and draped in the usual sterile fashion. The extremity was  wrapped in Esmarch, knee flexed, tourniquet inflated to 300 mmHg.   Standard midline incision was made with a 10-blade through the subcutaneous  tissue to the level of the extensor mechanism.  A fresh blade was used to  make a medial parapatellar arthrotomy and the soft tissue of proximal medial  tibia subperiosteally elevated to the joint line with the knife into the  semimembranosus bursa with a curved osteotome.  Soft tissue of the proximal  lateral tibia was also elevated with attention being paid to avoiding the  patellar tendon on the tibial tubercle.  Patella was  then everted and knee  flexed 90 degrees and the ACL and PCL removed.  Drill was used to create a  starting hole at the distal femur.  The canal was irrigated.  A five-degree  right valgus alignment guide was placed.  Referencing off the posterior  condyles, rotation was marked and the block pins were moved 10 mm off the  distal femur.  Distal femoral resection was made with an oscillating saw.  Size 4  was the most appropriate femoral size.  Rotation was marked off the  epicondylar axis.  The AB cutting block was placed for a size 4 and the  anterior and posterior cuts made.   Tibia was subluxed forward and the menisci removed.  Extra medullary tibial  alignment guide placed surfacing proximally at the medial aspect of the  tibial tubercle and distally along the second metatarsal axis and tibial  crest.  A block  was pinned to remove 10 mm of the nondeficient lateral side.  This would not take barely anything off the medial side; thus we readjusted  to take 2 mm off the medial side. Tibial resection was made with an  oscillating saw.  The size 3 was most appropriate tibial component.  Then  the proximal tibia was prepared with a modular drill and keel punch for a  size 3.  Femoral preparation was then completed with the intercondylar and  chamfer cuts.   Trial size 4 posterior stabilized femur, size 3 mobile bearing tibial tray  and a 15 mm posterior stabilized rotating platform insert were placed.  With  the 15, there was a tiny bit of play with the varus and valgus stressing  with the 17.5 and it was set with perfect balance varus and valgus  throughout the full range of motion.  The patella was then everted and its  thickness measured to be 24 mm, free hand resection taken down to 12 mm, a  41 template was placed, lug holes were drilled, trial patella was placed and  it tracks normally.  Osteophytes were removed off the posterior femur with  the trials in place.  All trials were  removed.  The cut bone surfaces were  then prepared with pulsatile lavage.  She had a cyst on the lateral femoral  condyle which I curetted out and then packed with bone graft from her  femoral cut.  We then mixed the cement and once ready for implantation, the  size 3 mobile bearing tibial tray, size 4 posterior stabilized femur and 41  patella cemented into place and patella held with a clamp.  The trial insert  was placed and the knee held in full extension and all extruded cement  removed.  Once the cement had fully hardened and the permanent size 17.5 mm  posterior stabilized rotating platform insert was placed into the tibial  tray.  Once again there was excellent varus and valgus balance throughout  full range of motion. The wounds were then copiously irrigated with  antibiotic solution and the extensor mechanism closed over a Hemovac drain  with interrupted #1 PDS.  The tourniquet released after a total time of 39  minutes.  Subcutaneous tissues were then closed with interrupted 2-0 Vicryl,  subcuticular running 4-0 Monocryl.  Incision was cleaned and dried and Steri-  Strips and a bulky sterile dressing applied.  Prior to placing the dressing,  the catheter for the Marcaine pain pump was placed and the pain pump was  initiated.  Once the dressing is applied, then the knee immobilizer was  placed and she was awakened and transported to recovery in stable condition.                                               Ollen Gross, M.D.    FA/MEDQ  D:  12/22/2003  T:  12/22/2003  Job:  [id number]

## 2011-02-16 NOTE — Consult Note (Signed)
NAME:  Melissa [name], Melissa [name]             ACCOUNT NO.:  [id number]   MEDICAL RECORD NO.:  [id number]          PATIENT TYPE:  INP   LOCATION:  0364                         FACILITY:  [facility]   PHYSICIAN:  Rene Paci, M.D. LHCDATE OF BIRTH:  [birth year]   DATE OF CONSULTATION:  07/28/2004  DATE OF DISCHARGE:  07/28/2004                                   CONSULTATION   I have been asked by GI to see patient regarding low TSH.   Melissa [name] is a 60 year old white female admitted yesterday by GI service  for evaluation of nausea, vomiting, and weight loss, persistent and  increasing over the past several weeks. CBC, CMET including LFTs, amylase,  lipase were negative. The patient had increased tremor with administration  of Reglan and is therefore referred at this time for further GI evaluation.  She underwent EGD the day of admission which showed nonspecific gastritis  without other changes. She also had a CT of abdomen and pelvis with contrast  which showed acute abnormalities. The patient is status post cholecystectomy  and has a 2 x 3 cm right ovarian simple cyst. Among other laboratory tests  done, a TSH was sent which returned abnormally low at 0.012. Because of this  and other symptoms concerning for hyperthyroidism, we have been asked to  further evaluate this patient.   The patient does have approximately 4 to 6 weeks history of weight loss  associated with anorexia, nausea, vomiting, and loose stools 3 to 4 daily.  She has been tremulous. She has had palpitations and difficulty sleeping,  increased level of anxiety. She does note approximately three weeks ago  perhaps a slight nodule type swelling in the right mid neck which has since  resolved. The patient denies fevers, chills. No family history of autoimmune  diseases. No sick contacts or neck soreness. No chest pain, shortness of  breath. No headaches or vision changes. No other noted neck swelling. No  edema.   PAST MEDICAL  HISTORY:  1.  Hypertension.  2.  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  3.  Cholecystectomy October 2005.   MEDICATIONS:  1.  Toprol-XL 100 mg daily.  2.  Micardis 80/12.5 q.d.   ALLERGIES:  None known.   FAMILY HISTORY:  Positive for hypertension.   SOCIAL HISTORY:  She is married. No tobacco or alcohol use. She is a Charity fundraiser at  [employer]   REVIEW OF SYSTEMS:  Positives described above, otherwise negative 9-point  review.   PHYSICAL EXAMINATION:  VITAL SIGNS:  Temperature 98.2, blood pressure  159/75, pulse 110 resting, respirations 20, saturating 97% on room air.  GENERAL:  Melissa [name] is a pleasant, slightly overweight, very tremulous  and anxious appearing white female that is in no acute distress.  HEENT:  Unremarkable. Wears corrective lenses.  NECK:  Shows no detectable goiter or nodules by palpation. No JVD. Supple.  LUNGS:  Clear to auscultation without wheeze or crackle.  CARDIOVASCULAR:  Tachy but regular. No murmurs.  ABDOMEN:  Soft, nontender, nondistended with good bowel sounds throughout.  No guarding or rebound.  EXTREMITIES:  Showed no edema,  swelling, or erythema.  NEUROLOGICAL:  Shows diffuse tremor, resting and active, with slightly  hyperactive reflexes, no focal deficits on cranial nerves or motor exam.  Gait not tested.   LABORATORY DATA:  Significant for TSH of 0.012. Sed rate of 52. Iron of 49.  Normal white count, hemoglobin of 9.9, normal platelet count. Potassium of  3.2, glucose 115, alkaline phosphatase 130. UA negative. EGD October 27:  Nonspecific gastritis. CT of abdomen and pelvis with contrast October 27  without acute abnormality status post cholecystectomy; right ovarian cyst is  noted.   ASSESSMENT/PLAN:  1.  Depressed TSH with symptoms consistent of hyperthyroid disease. Question      Grave's versus other thyroiditis. Will check free T4 and T3 to confirm      diagnosis and likely begin medication treatment with Tapazole 10  mg      t.i.d. until symptoms controlled and then continue 10 mg daily      maintanence dose or as per direction of endocrinologist, Dr. Romero Belling. The patient will schedule appointment with Dr. Everardo All in the      next 2 to 4 weeks to review this. The patient will also need scheduling      for a thyroid nuclear uptake scan to help determine cause of probable      hyperthyroidism; however, because of contrast received on the CAT scan      yesterday, this will need to be delayed 6 weeks, therefore scheduled for      the week of December 12. T3 and T4 will be drawn today. The patient is      anxious for discharge and will be given a prescription for Tapazole to      be begun once confirmation of elevated thyroid levels are received by me      in the next 24 to 48 hours. The patient understands these directions and      plans for follow up.  2.  Elevated sed rate. This is likely secondary to inflammatory process      related to hyperthyroidism. No further workup at this time.  3.  Normocytic anemia. No signs or symptoms of bleeding. Normal iron. No      further workup.  4.  History of hypertension. Continue home medications.  5.  Hypokalemia, likely secondary to diuretics. Replace prior to discharge.      Followup p.r.n.  6.  Nausea, vomiting, weight loss. All likely secondary to hyperthyroidism.      No GI abnormalities found. Continue Zofran p.r.n. post discharge.     [place]ale   VL/MEDQ  D:  07/28/2004  T:  07/28/2004  Job:  [id number]

## 2011-02-16 NOTE — Discharge Summary (Signed)
NAME:  Melissa [name], Melissa [name]             ACCOUNT NO.:  [id number]   MEDICAL RECORD NO.:  [id number]          PATIENT TYPE:  INP   LOCATION:  1511                         FACILITY:  [facility]   PHYSICIAN:  Ollen Gross, M.D.    DATE OF BIRTH:  [birth year]   DATE OF ADMISSION:  11/26/2005  DATE OF DISCHARGE:  11/30/2005                                 DISCHARGE SUMMARY   ADMISSION DIAGNOSES:  1.  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  2.  Past history of methicillin-resistant Staphylococcus aureus infection.  3.  Hypertension.  4.  Postmenopausal.  5.  Hypothyroidism.   DISCHARGE DIAGNOSES:  1.  Loose right tibial component total knee, status post right tibial      revision.  2.  Past history of methicillin-resistant Staphylococcus aureus infection.  3.  Hypertension.  4.  Postmenopausal.  5.  Hypothyroidism.   PROCEDURE:  November 26, 2005, right tibial component revision.   SURGEON:  Dr. Ollen Gross, M.D.   ASSISTANT:  Alexzandrew L. Perkins, P.A.C.   ANESTHESIA:  General.  Postoperative PCA pain pump.   CONSULTATIONS:  None.   BRIEF HISTORY:  The patient is a 60 year old female, well known to Dr. Ollen Gross, having done a right total knee approximately two years ago.  Did  well until a few weeks ago.  She had significant medial side pain.  X-rays  showed collapse of the tibial component medially with significant bone loss.  Preoperative CBC and sed rate were unremarkable.  Felt to be mechanical  failure.  Presented for revision.   Preoperative CBC had hemoglobin 13.9, hematocrit 40.3, white cell count  normal at 9.2.  Postoperative hemoglobin 12.2, white count went up to 13.3  but came back down to 9.4.  Postoperative hemoglobin down to 10.2 and back  up to 10.6.  PT/PTT preoperative 13.6 and 28 respectively.  INR 1.0.  Serial  pro times as follows PT/INR 26.5 and 2.4.  Chems done on admission elevated  BUN and creatinine of 28 and 1.4, elevated ALP of  221, remaining Chem panel  within normal limits.  Serial Chems were followed.  A BUN came down to 24,  creatinine came down 1.2.  Electrolytes remained within normal limits.  Preoperative UA 7 to 10 white cells, 0 to 2 red cells, moderate leukocyte  esterase, few epithelial cells, moderate hemoglobin.  Blood type O positive.  Fluid culture:  Stat Gram-stain no organisms seen.  Cultures showed no  growth.  Anaerobic culture no growth.  No anaerobes isolated.  EKG November 23, 2005 normal sinus rhythm, low voltage QRS, no previous EKGs, confirmed  by Dr. Nicki Guadalajara.  Right knee films November 26, 2005, postoperative  findings as above, status post revision of right total knee.  No  radiographic evidence of hardware failure.   HOSPITAL COURSE:  Admitted to [facility].  Tolerated all  procedures well.  Later transferred to recovery room and then orthopedic  floor.  Started on PCA and p.o. analgesics.  Day one, doing pretty well.  No  signs of infection in the knee.  Hemovac drain  was pulled.  Started getting  up with physical therapy.  Fluids were KVO.  Hemoglobin was 12.2.   By day two, doing a little bit better.  Pain continued each day.  Dressing  was changed.  The incision looked good.  Hemoglobin stabilized at 10.2.  Started getting up with physical therapy and ambulated approximately 55  feet.   By day three, still a little sore but continued improved pain control.  Once  she progressed well with therapy, she was ready to go home the following day  on November 30, 2005.   DISCHARGE PLAN:  1.  The patient was discharged home on November 30, 2005.  2.  Discharge diagnoses, please see above.  3.  Discharge medications:  Coumadin, Percocet, Robaxin, Zofran.  4.  Diet:  Resume previous home diet.  5.  Activity:  Weightbearing as tolerated.  Home PT and home health nursing.  6.  Follow up in two weeks.   DISPOSITION:  Home.   CONDITION ON DISCHARGE:  Improved.       Alexzandrew L. Julien Girt, P.A.      Ollen Gross, M.D.  Electronically Signed    ALP/MEDQ  D:  01/16/2006  T:  01/16/2006  Job:  [id number]   cc:   Ollen Gross, M.D.  Fax: [phone]   Valetta Mole. Swords, M.D. [facility]  [address] [place]  [place] [address]

## 2011-02-16 NOTE — Discharge Summary (Signed)
NAME:  Melissa [name], Melissa [name]                         ACCOUNT NO.:  [id number]   MEDICAL RECORD NO.:  [id number]                   PATIENT TYPE:  INP   LOCATION:  0480                                 FACILITY:  [facility]   PHYSICIAN:  Ollen Gross, M.D.                 DATE OF BIRTH:  1951-09-22   DATE OF ADMISSION:  12/22/2003  DATE OF DISCHARGE:  12/26/2003                                 DISCHARGE SUMMARY   ADMISSION DIAGNOSES:  1. Bilateral knee osteoarthritis, right more symptomatic than left.  2. Hypertension.  3. Postmenopausal.  4. History of methicillin-resistant Staphylococcus aureus infection.   DISCHARGE DIAGNOSES:  1. Osteoarthritis, right knee, status post right total knee arthroplasty.  2. Osteoarthritis, left knee.  3. Mild postoperative blood loss anemia.  4. Hypertension.  5. Postmenopausal.  6. History of methicillin-resistant Staphylococcus aureus infection.   PROCEDURE:  Date of surgery, December 22, 2003, right total knee arthroplasty;  surgeon, Ollen Gross, M.D.; assistant, Alexzandrew L. Perkins, PA-C;  anesthesia--general, postoperative Marcaine pain pump; blood loss minimal;  Hemovac drain times one; tourniquet time 57 minutes of 300 mmHg.   CONSULTATIONS:  None.   BRIEF HISTORY:  Justiss is a 59 year old female with end-stage osteoarthritis  of both knees, right more symptomatic than left. She has a severe varus  deformity on the right. Pain has been refractory to nonoperative management  and now presents for total knee arthroplasty.   LABORATORY DATA:  CBC on admission revealed hemoglobin of 14.5, hematocrit  42.4, white cell count 7.8, red cell count 4.77, and differential within  normal limits. Postoperative H&H 12.3 and 36.3; last known H&H 10.0 and  29.0. PT and PTT on admission 12.8 and 28 respectively and INR 0.9.  Serial  protimes followed. PT-INR 22.1 and 2.6. Chem panel on admission revealed  glucose of 101, elevated ALP of 123. Remaining chem panel  within normal  limits.  Serial BMETs were followed. Sodium dropped from to 143 down to 133  and back up to 135. The remaining electrolytes remained within normal  limits. Glucose went up from 101 to 167 and back down to 130. Urinalysis  revealed moderate leukocyte esterase, a few epithelial cells, 11-20 white  cells, 0-2 red cells, rare bacteria. This was treated preoperatively. Blood  group type O positive.   EKG dated December 09, 2003, sinus rhythm, poor R-wave progression, probable  normal variant, low QRS voltages, borderline EKG confirmed. Cannot read  signature. Chest x-ray dated December 15, 2003, revealed no acute  cardiopulmonary disease.   HOSPITAL COURSE:  The patient admitted to [facility], taken to the  operating room, and underwent the above-stated procedure without  complications. The patient tolerated the procedure well, later transferred  to the recovery room, and then to the orthopedic floor for continued  postoperative care. Vital signs were followed. The patient was placed on PCA  and p.o. analgesia for pain control following surgery,  given 24 hours of  postoperative antibiotics in the form of Ancef, Coumadin for DVT  prophylaxis. PT and OT were consulted, weightbearing as tolerated. Hemovac  drain placed at the time of surgery was removed on postoperative day one.  Hemoglobin was 12.3 on that day. Fluids were KVO. She did have a little bit  of nausea and vomiting, and low urinary output over that night. She  underwent a fluid bolus, given antiemetics for the nausea. By day two  urinary output improved.  PCA and IVs were discontinued. Dressings changed,  incision was healing well. Marcaine pain pump removed. She started getting  up with therapy. She was already ambulating approximately 80 feet by day  two. By day three she was doing much better, progressing well, felt she  would be ready to go home over the weekend. By day four she was doing quite  well,  progressing with therapy and was discharged home.   DISCHARGE MEDICATIONS AND PLAN:  1. The patient discharged home on December 26, 2003.  2. For discharge diagnoses, please see above.  3. Discharge medications are Percocet, Robaxin, and Coumadin.  4. Diet--low sodium diet.  5. Activity--weightbearing as tolerated; home health PT and home health     nursing; total knee protocol.  6. Followup--two weeks from surgery.   DISPOSITION:  Home.   CONDITION ON DISCHARGE:  Improved.     Alexzandrew L. Julien Girt, P.A.              Ollen Gross, M.D.    ALP/MEDQ  D:  01/26/2004  T:  01/26/2004  Job:  [id number]   cc:   Ollen Gross, M.D.  [facility]  [address]  [address]  [place]  [place] [address]  Fax: [phone]   Stacie Glaze, M.D. [facility]

## 2011-02-16 NOTE — Op Note (Signed)
NAME:  Melissa [name] NO.:  [id number]   MEDICAL RECORD NO.:  [id number]          PATIENT TYPE:  INP   LOCATION:  X002                         FACILITY:  [facility]   PHYSICIAN:  Ollen Gross, M.D.    DATE OF BIRTH:  Sep 14, 1951   DATE OF PROCEDURE:  11/26/2005  DATE OF DISCHARGE:                                 OPERATIVE REPORT   PREOPERATIVE DIAGNOSIS:  Loose right tibial component, total knee  arthroplasty.   POSTOPERATIVE DIAGNOSIS:  Loose right tibial component, total knee  arthroplasty.   PROCEDURE:  Right tibial revision.   SURGEON:  Ollen Gross, M.D.   ASSISTANT:  Alexzandrew L. Julien Girt, P.A.   ANESTHESIA:  General with postoperative Marcaine pain pump.   ESTIMATED BLOOD LOSS:  None.   DRAINS:  Hemovac x1.   TOURNIQUET TIME:  Seventy-five minutes at 300 mmHg.   COMPLICATIONS:  None.   CONDITION:  Stable to the recovery room.   CLINICAL NOTE:  Melissa [name] is a 59 year old female who had a right total knee  arthroplasty done approximately two years ago.  She did very well until the  past few weeks, when she has had significant medial-sided pain.  X-rays last  week in the office show collapse of the tibial component medially with  significant bone loss.  Her preoperative CBC and sed rate were unremarkable.  Given her significant pain and mechanical failure, she presents now for  tibial versus total knee revision with intraoperative cultures.   PROCEDURE IN DETAIL:  After successful administration of general anesthetic,  a tourniquet was placed high on her right thigh, and right lower extremity  prepped and draped in the usual sterile fashion.  Extremity was wrapped in  esmarch.  Knee flexed.  Tourniquet inflated to 300 mmHg.  A midline incision  was made with a 10 blade through the subcutaneous tissue through the level  of the extensor mechanism.  A fresh blade is used to make a medial  parapatellar arthrotomy.  We entered the joint.  There was  minimal clear  synovial fluid.  It was sent for a stat Gram's stain, which showed no  organisms and no white cells.  I then completed the arthrotomy.  She had  some scar tissue under the quadriceps tendon and that is removed.  There is  some area that appears inflamed, but it appears more consistent with  mechanical debris.  This is sent for frozen section, which showed no acute  inflammation.  The femoral components were inspected and appear solid.  No  evidence of any loosening.  I attempted to rotate it and move it, and it was  very solid.  The same was true for the patellar component.  On the tibial  side, however, it had collapsed into significant varus and was grossly  loose.  I removed any abnormal-appearing tissue.  There was no evidence of  any infection based on the gross appearance of the tissue.  The tibia was  then subluxed forward, and the tibial polyethylene is removed.  The tibial  component is easily removed.  All cement and tissue underlying the component  is removed.  I sent this tissue again for frozen section, and there is no  acute inflammation.  The bone had a somewhat of an osteonecrotic appearance  medially in the area where the collapse was.  I resected down to normal-  appearing bone.  On the lateral side, the bone looked normal.  The spacer  she had in previously was a 17.5.  We then removed all of the cement from  the tibial canal.  The canal was thoroughly irrigated.  I reamed the canal  to 15 mm.  We then did a proximal sleeve but broaching the sleeve up to a 29  mm sleeve.  We left the sleeve in and did our resection laterally off the  top of the sleeve.  Medially, I cut down to get to normal bone.  This was  about 5 mm below the resection level laterally.  Since she already had a  large spacer, I put a 5 mm augment laterally and a 10 mm augment medially to  raise the level of the tibia.  This got [place] to a level right at or slightly  above the fibular head.  A  trial was placed, size 4, with the augments, as  above.  We had a great fit with this.  We then placed a trial 17.5 spacer,  and there was a tiny bit of laxity with that, so we went up to a 20, which  had great stability with full extension through full range of motion.  The  trials were removed, and we sized the canal restrictor for the tibia, and  size 4 was most appropriate.  We thoroughly irrigated it again and mixed the  two bags of cement with 2 gm of vancomycin.  It was then injected into the  tibial canal, pressurized.  The tibial component was placed.  Once again, a  13 x 60 stone extension with a size 4 MBT revision tray.  We put the size 3  5 mm augment lateral, 10 mm augment medially.  We did the size 3 because of  the way the tibia had tapered down, and we did not want any overhang.  The  component is impacted, and all extruded cement removed.  The trial insert is  placed, and the knee held in full extension.  Further extruded cement  removed.  The permanent 20 mm posterior stabilized rotating platform insert  is then placed into the tibial tray.  We had excellent stability through a  full range of motion, and her alignment was corrected back to neutral.  The  wound is copiously irrigated with saline solution, and then we closed the  quadriceps snip with interrupted #1 PDS.  The tourniquet was then released  for a total time of 75 minutes.  Minor bleeding stopped with cautery.  The  extensor mechanism is closed over a Hemovac drain with interrupted #1 PDS.  Flexion against gravity was about 95 degrees.  Subcu was closed with  interrupted 2-0 Vicryl and the skin with staples.  The incision was cleaned  and dried.  A bulky sterile dressing applied.  The drain was hooked to  suction.  She was placed into a knee immobilizer, awakened, and transported  to recovery in stable condition.      Ollen Gross, M.D.  Electronically Signed    FA/MEDQ  D:  11/26/2005  T:  11/27/2005   Job:  [id number]

## 2011-02-16 NOTE — Op Note (Signed)
NAME:  Melissa [name], Melissa [name]             ACCOUNT NO.:  [id number]   MEDICAL RECORD NO.:  [id number]          PATIENT TYPE:  INP   LOCATION:  0005                         FACILITY:  [facility]   PHYSICIAN:  Ollen Gross, M.D.    DATE OF BIRTH:  [birth year]   DATE OF PROCEDURE:  11/08/2004  DATE OF DISCHARGE:                                 OPERATIVE REPORT   PREOPERATIVE DIAGNOSES:  Osteoarthritis of the left knee.   POSTOPERATIVE DIAGNOSES:  Osteoarthritis of the left knee.   PROCEDURE:  Left total knee arthroplasty computer navigated.   SURGEON:  Ollen Gross, M.D.   ASSISTANT:  Alexzandrew L. Julien Girt, P.A.   ANESTHESIA:  General with postop Marcaine pain pump.   ESTIMATED BLOOD LOSS:  Minimal.   DRAIN:  Hemovac x1.   TOURNIQUET TIME:  62 minutes at 300 mmHg.   COMPLICATIONS:  None.   CONDITION:  Stable to recovery.   BRIEF CLINICAL NOTE:  Melissa [name] is a 60 year old female with end-stage  arthritis of the left knee with intractable pain. She has had a previous  successful right total knee arthroplasty and presents now for left total  knee arthroplasty.   DESCRIPTION OF PROCEDURE:  After successful administration of general  anesthetic, a tourniquet was placed high on the left thigh and left lower  extremity prepped and draped in the usual sterile fashion.  Extremities  wrapped in Esmarch, knee flexed and tourniquet inflated to 300 mmHg. A  standard midline was made with a 10 blade through the subcutaneous tissue to  the level of the extensor mechanism. A fresh blade was used to make a medial  parapatellar arthrotomy and the soft tissue over the proximal medial tibia  subperiosteally elevated to the joint line with a knife and into the  semimembranosus bursa with a Cobb elevator. The soft tissue over the  proximal lateral tibia is also elevated with attention being paid to  avoiding the patellar tendon on tibial tubercle. The patella was then  everted, knee flexed to 90 degrees,  ACL and PCL removed. The 4 mm Schanz  pins were then placed, two in the tibia and two in the femur for the  computer navigation. The computer arrays are then hooked up and the anatomic  data entered into the computer to generate the computerized models of the  femur and tibia.  Her alignment preoperatively is approximately 7-8 degrees  of varus with about a 10 degree flexion contracture.   We then began preparing for the bone cuts. The distal femoral cuts made  first under computer guidance. We planned it for a neutral varus valgus and  to take 10 mm off the distal femur. Distal femoral resection is made with an  oscillating saw and then the verifying device is placed on the bone cut to  verify that the cut is made as planned.  The rotational guide is then placed  and we used the epicondylar axis as our reference point. The sizing is for a  size 4 and the rotation is off the epicondylar axis.  The size 4 cutting  block is placed and then the  anterior, posterior and chamfer cuts are made.  Again verification confirms that the cut was made as planned.   The tibia is then subluxed forward and the menisci are removed. The tibial  cutting guide is placed with the computer navigation to remove 10 mm off the  nondeficient lateral side.  This barely got down to the base of the tibial  defect thus we took an additional 2 mm.  The cut is made in neutral with  respect to varus and valgus and in about 2-3 degrees of posterior slope.  Resection is then made with the oscillating saw.  The verifier is placed and  confirms that the cut was made as planned. A size 3 is the most appropriate  tibial component and the proximal tibia is prepared with the modular drill  and the keel punch for a size 3.  Femoral preparation is completed with the  intercondylar cut for a size 4.  The trial size 3 mobile bearing tibial  tray, size 4 posterior stabilized femur and a 10 mm posterior stabilized  rotating platform  insert trial are placed. With a 10, there is a little bit  of varus and valgus placed so we went up to a 12.5 which allowed for full  extension with excellent varus and valgus balance throughout full range of  motion.  Overall alignment was about 1 degree varus and there was less than  3 degrees flexion contracture. Once I released the tissues and the  osteophytes from the posterior femur, we were able to get out to full  extension. The patella was then everted, thickness measured to be 24 mm.  Free hand resection is taken down to 13 mm, 41 template is placed, lug holes  are drilled, trial patella is placed and it tracks normally. All trials are  then removed and the cut bone surfaces are prepared with pulsatile lavage.  Cement is mixed and once ready for implantation, the size 3 mobile bearing  tibial tray, size 3 posterior stabilized femur and 41 patella are cemented  into place and the patella is held with the clamp. A trial 12.5 insert is  placed, knee held in full extension and all extruded cement removed. Once  the cement is fully hardened then the permanent 12.5 mm posterior stabilized  rotating platform insert is placed into the tibial tray. The wound is  copiously irrigated with saline solution and the extensor mechanism closed  over a Hemovac drain with interrupted #1 PDS.  Flexion against gravity is  140 degrees. The tourniquet is then released with a total time of 62  minutes. We had done a superficial lateral release during the procedure and  the lateral geniculate superiorly was not bleeding. The subcu was closed  with interrupted 2-0 Vicryl, subcuticular with running 4-0 Monocryl. The  catheter for the Marcaine pain pump is placed and the pump initiated.  Hemovac is hooked to suction, bulky sterile dressing applied, she is placed  into a knee immobilizer, awakened and transported to recovery in stable  condition.      FA/MEDQ  D:  11/08/2004  T:  11/08/2004  Job:   [id number]

## 2011-02-16 NOTE — Discharge Summary (Signed)
NAMEMarland [name]  MAGDELENE, RUARK             ACCOUNT NO.:  [id number]   MEDICAL RECORD NO.:  [id number]          PATIENT TYPE:  INP   LOCATION:  0364                         [facility]ACILITY:  [facility]   PHYSICIAN:  Malcolm T. Russella Dar, M.D. [facility] OF BIRTH:  [birth year]   DATE OF ADMISSION:  07/27/2004  DATE OF DISCHARGE:  07/28/2004                                 DISCHARGE SUMMARY   ADMITTING DIAGNOSES:  101.  A 60 year old white female with refractory nausea and vomiting x 5      weeks, status post laparoscopic cholecystectomy approximately 2 weeks      ago for same, with persistent symptoms.  Etiology not clear.  2.  Weight loss secondary to above.  3.  Jitteriness, possibly secondary to Reglan.  4.  Normocytic anemia.  5.  History of hypertension.  6.  Status post cholecystectomy, October 2005.  7.  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   DISCHARGE DIAGNOSES:  1.  Nausea, vomiting, and weight loss, likely secondary to hyperthyroidism,      rule out Graves versus other thyroiditis.  2.  Elevated sed rate, likely secondary to inflammatory process related to      hyperthyroidism.  3.  Normocytic anemia.  3.  Hypertension.  4.  Hypokalemia, resolved.  5.  Status post cholecystectomy, October 2005.  6.  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   CONSULTATIONS:  Dr. Felicity Coyer, internal medicine.   PROCEDURES:  CT scan of the abdomen and pelvis.   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  She is status post right knee  replacement in March 2005.  She has otherwise been in good health.  At this  time, she has had about a 4-6 week history of nausea and intermittent  vomiting which has been recurrent or relapsing in nature.  At this point,  she is having fairly constant nausea and also has had an associated weight  loss of almost 30 pounds in that time period.  She denies any fever or  chills, no headaches or  significant abdominal pain.  She had undergone upper  endoscopy with Dr. Russella Dar which did show some gastritis, and she was started  on Zegerid.  She then had abdominal ultrasound which showed cholelithiasis  and a slightly dilated bile duct.  She was referred to surgery, underwent  laparoscopic cholecystectomy with Dr. Daphine Deutscher on October 14.  She did have a  shrunken gallbladder with stones and chronic cholecystitis.  She says she  felt better for about a week postoperatively but then had recurrent nausea  and intermittent vomiting.  She was started on Reglan a couple of days ago  but says this is making her feel very jittery.  Today, she had tried to go  to work, had recurrent vomiting, generally feeling poorly and fatigued and  was sent to the ER for GI evaluation.  She is seen and evaluated and  admitted for further diagnostic work-up.  She was noted to have  hemoglobin  of 10.8, normocytic and sed rate of 52, white  count 6.2, liver function  studies normal with the exception of an alk phos at 130.   LABORATORY STUDIES ON ADMISSION:  Again WBC 6.2, hemoglobin 10.8, hematocrit  31.5, MCV 84, platelets 224.  Follow up on October 28, WBC 4.7, hemoglobin  9.9, hematocrit 29.2, MCV 84, sed rate 52, potassium was 3.2 on admission,  BUN 15, creatinine 0.9, liver functions normal with the exception of an alk  phos at 130, amylase and lipase both normal.  Albumin 3.4.  Serum iron was  49.  UA showed a small leukocyte esterase with 0-2 WBCs.  TSH was quite low  at 0.012, free T4 elevated at 5.21, and T3 elevated at 6221.8.   X-RAY STUDIES:  Plain abdominal films showed a mild ileus.  Chest x-ray was  negative.  CT of the abdomen and pelvis showed a 2 x 3 cm right ovarian  cyst.  Otherwise, unremarkable study.   HOSPITAL COURSE:  The patient was admitted to the service of Dr. Russella Dar, who  was covering the hospital, and she was started on IV fluids, antiemetics.  We obtained baseline labs in  addition to TSH stool for WBCs, C. difficile  iron studies, and then scheduled her for a CT of the abdomen and pelvis.  This was a negative study, and her labs returned showing an elevated sed  rate at 52 and abnormal thyroid studies consistent with a hyperthyroid  state.  It was felt that this could be the etiology of her symptoms, and Dr.  Felicity Coyer was contacted for internal medicine consult.  She also felt that  this likely could explain her symptoms.  She had also been having some  palpitations, difficulty sleeping, and an increased level of anxiety.  She  was started on Tapazole 10 mg t.i.d. until her symptoms were controlled and  then 10 mg daily maintenance or as directed by Dr. Everardo All.  She was  scheduled a follow up appointment with Dr. Everardo All in 2-4 weeks.  She was to  have an outpatient thyroid nuclear scan which was to be scheduled on an  outpatient basis.  The patient was anxious for discharge and therefore was  discharged on the 28th per Dr. Felicity Coyer in a stable condition.   MEDICATIONS ON DISCHARGE:  1.  Tapazole 10 mg t.i.d. initially and then as directed per internist/Dr.      Everardo All.  2.  Zofran 4 mg q.6-8h. p.r.n. nausea.  3.  She was to continue her Toprol and Micardis as previous and, again, to      arrange thyroid nuclear uptake scan in approximately 6 weeks.      AE/MEDQ  D:  08/09/2004  T:  08/09/2004  Job:  [id number]   cc:   Venita Lick. Russella Dar, M.D. [facility]   [place]. Daphine Deutscher, MD  [address], [address]  [place]  [place] [address]  Fax: [phone]   Gregary Signs A. Everardo All, M.D. [facility]   Dr. Lovell Sheehan

## 2011-02-16 NOTE — H&P (Signed)
NAME:  Melissa [name], Melissa [name]                         ACCOUNT NO.:  [id number]   MEDICAL RECORD NO.:  [id number]                   PATIENT TYPE:  INP   LOCATION:  0480                                 FACILITY:  [facility]   PHYSICIAN:  Ollen Gross, M.D.                 DATE OF BIRTH:  1951-03-24   DATE OF ADMISSION:  12/22/2003  DATE OF DISCHARGE:                                HISTORY & PHYSICAL   DATE OF OFFICE VISIT HISTORY AND PHYSICAL:  December 16, 2003   CHIEF COMPLAINT:  Right knee pain.   HISTORY OF PRESENT ILLNESS:  The patient is a 60 year old female known to  Dr. Homero Fellers Aluisio.  She has a longstanding history of bilateral knee pain  and the right is more symptomatic than the left.  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  This  does cause pain her knees and causes her to sit down and rest several times  throughout the day.  It is starting to interfere with her mobility and pain  is increasing.  X-rays in the office show severe end-stage arthritis of both  knees.  Medial compartments bilaterally are bone-on-bone.  The right seems  to be more symptomatic and problematic than the left.  It is felt she would  be an appropriate candidate for knee replacement.  Risks and benefits of the  procedure have been discussed with the patient and she elects to proceed  with the surgery.   ALLERGIES:  No known drug allergies.   CURRENT MEDICATIONS:  1. Toprol-XL 100 mg.  2. Micardis 80/12.5 mg.  3. Tylenol as needed.   PAST MEDICAL HISTORY:  1. Past history of MRSA infection.  2. Hypertension.  3. Osteoarthritis.  4. Postmenopausal.   PAST SURGICAL HISTORY:  Negative.   FAMILY HISTORY:  Mother living, age 68, with a history of hypertension.  She  also has two sisters with hypertension.  One of her sisters has rheumatoid  arthritis.   SOCIAL HISTORY:  Married.  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  Nonsmoker.  Social  intake of alcohol.  Has one child.  Her husband will be  assisting with her care after surgery.   REVIEW OF SYSTEMS:  GENERAL:  No fevers, chills, or night sweats.  NEURO:  No seizures, syncope, or paralysis.  RESPIRATORY:  No shortness of breath,  productive cough, or hemoptysis.  CARDIOVASCULAR:  No chest pain, angina, or  orthopnea.  GI:  No nausea, vomiting, diarrhea, or constipation.  GU:  No  dysuria, hematuria, or discharge.  MUSCULOSKELETAL:  Pertinent to the knee,  found in the history of present illness.   PHYSICAL EXAMINATION:  VITAL SIGNS:  Pulse 68, respirations 16, blood  pressure 154/92.  GENERAL:  A 60 year old female well-nourished, well-developed, no acute  distress.  She is alert, oriented, and cooperative.  Appears  to be an  excellent historian.  HEENT:  Normocephalic, atraumatic.  Pupils are round and reactive.  Oropharynx clear.  EOMs are intact.  NECK:  Supple.  No carotid bruits are appreciated.  CHEST:  Clear anterior posterior chest walls.  No rhonchi, rales, or  wheezing.  HEART:  Regular rate and rhythm, no murmurs.  ABDOMEN:  Soft, nontender, bowel sounds are present.  RECTAL, BREASTS, GENITALIA:  Not done, not pertinent to present illness.  EXTREMITIES:  Significant to the right knee.  Shows range of motion  passively lacking 5 degrees of full extension with flexion up to 115  degrees, moderate crepitus is noted.  She does ambulate with an antalgic  gait.   IMPRESSION:  1. Bilateral knee osteoarthritis, right more symptomatic than left.  2. Hypertension.  3. Postmenopausal.  4. History of methicillin-resistant Staphylococcus aureus infection.   PLAN:  The patient will be admitted to [facility] and undergo  right total knee arthroplasty.  The surgery will be performed by Dr. Ollen Gross.  Dr. Darryll Capers will be notified of the room number on admission  and will be consulted if needed for any medical assistance with the patient   throughout the hospital course.     Melissa [name], P.A.              Ollen Gross, M.D.    ALP/MEDQ  D:  12/25/2003  T:  12/25/2003  Job:  [id number]   cc:   Stacie Glaze, M.D. [facility]

## 2011-02-16 NOTE — H&P (Signed)
NAMEMarland Kitchen  Melissa, [name]             ACCOUNT NO.:  [id number]   MEDICAL RECORD NO.:  [id number]          PATIENT TYPE:  INP   LOCATION:  0364                         [facility]ACILITY:  [facility]   PHYSICIAN:  Malcolm T. Russella Dar, M.D. [facility] OF BIRTH:  [birth year]   DATE OF ADMISSION:  07/27/2004  DATE OF DISCHARGE:                                HISTORY & PHYSICAL   CHIEF COMPLAINT:  Recurrent nausea, vomiting, and weight loss.   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  She has a history of hypertension and is status post right knee  replacement in March 2005.  Otherwise, she has been generally healthy.  She  presents at this time with approximately one month to six-week history of  nausea and intermittent vomiting which has been recurrent or relapsing in  nature.  At this time, she says she is now having fairly constant nausea.  She has had an associated weight loss of approximately 30 pounds in that  time period.  She denies any fever or chills, has not been having any  headaches, and no significant abdominal pain.  She initially had undergone  upper endoscopy with Dr. Russella Dar on July 06, 2004, with findings of  gastritis, and was started on Zegerid b.i.d.  She then had undergone upper  abdominal ultrasound which did show evidence of cholelithiasis and slightly  dilated bile duct.  She was referred on to surgery and underwent  laparoscopic cholecystectomy with Dr. Daphine Deutscher on July 14, 2004.  She was  found to have a markedly shrunken gallbladder with stones and marked chronic  cholecystitis, with slight intrahepatic ductal dilation.  The patient says  she felt better for about a week but did vomit once during that period of  time.  She then started back with nausea and then vomiting off and on,  though not on a daily basis.  She has no current complaints of pain, says  her bowel movements have been loose, and is currently having three to four  stools per day, with no melena or hematochezia.  She was started on Reglan  two days ago, but says she feels jittery.  Today, she had tried to go to  work, had recurrent vomiting, stated that she had felt bad, and was sent to  the ER for evaluation.  She was seen and evaluated in the emergency room,  was noted to be hemodynamically stable, but with the persistent nature of  her symptoms she is admitted for further diagnostic evaluation.  Prior  laboratory studies from July 15, 2004 had shown a potassium of 2.9,  hemoglobin 9.2, hematocrit 25.7, MCV 8.4, and platelets 147.  Repeat today  shows WBC 6.2, hemoglobin 10.8, hematocrit 31.5, MCV 84, platelet 224.  Potassium 3.2, glucose 115, BUN 15, creatinine 0.9, amylase 116, lipase 40.  Alk phos is slightly elevated at 130.  Albumin is 3.4.  Sed rate is 52.   CURRENT MEDICATIONS:  1.  Toprol 100 mg daily.  2.  Micardis 80/12.5 daily.  3.  Tylenol p.r.n.   ALLERGIES:  No known  drug allergies.   PAST HISTORY:  1.  Hypertension.  2.  Right knee replacement in March 2005.   FAMILY HISTORY:  Negative, with the exception of hypertension.   SOCIAL HISTORY:  The patient is married.  She is employed as a Engineer, civil (consulting) at  [employer].  She has one grown daughter.  She is a nonsmoker,  nondrinker.   REVIEW OF SYSTEMS:  CARDIOVASCULAR:  Denies any chest pain or anginal  symptoms.  PULMONARY:  Negative for cough, shortness of breath, or sputum  production.  GENITOURINARY:  Denies any dysuria, urgency, or frequency.  GASTROINTESTINAL:  As outlined above.   PHYSICAL EXAMINATION:  GENERAL:  Well-developed white female in no acute  distress.  PULSE:  100 and regular.  RESPIRATIONS:  16.  TEMPERATURE:  She is afebrile.  HEENT:  Atraumatic, normocephalic.  EOMI.  PERRLA.  Sclerae anicteric.  Buccal mucosa is normal.  She does have darkened skin periorbitally.  CARDIOVASCULAR:  Slightly tachy.  Regular rhythm, with S1 and S2.  PULMONARY:  Clear  to A&P.  ABDOMEN:  Soft.  Bowel sounds are active.  She is tender in the right upper  quadrant, right midquadrant, right lower quadrant.  There is no guarding or  rebound, no mass or hepatosplenomegaly.  RECTAL:  Heme negative, and without masses.  EXTREMITIES:  Without clubbing, cyanosis, or edema.  NEUROLOGIC:  Grossly nonfocal.   IMPRESSION:  71.  A 60 year old white female with refractory nausea and intermittent      vomiting x 5 weeks, status post laparoscopic cholecystectomy two weeks      ago for same, with persistent symptoms.  Etiology is not clear.  2.  Weight loss secondary to above.  3.  Normocytic anemia.  4.  History of hypertension.  5.  Status post cholecystectomy in October 2005.  6.  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   PLAN:  The patient is admitted to the service of Dr. Claudette Head for IV  fluid hydration, IV PPI coverage, antiemetics, plain abdominal films, and  will proceed with CT scan of the abdomen and pelvis.  If this is  unrevealing, may need CT scan of the head.  Will also check iron studies.  For details, please see the orders.      AE/MEDQ  D:  07/27/2004  T:  07/27/2004  Job:  [id number]   cc:   Venita Lick. Russella Dar, M.D. [facility]   [place]. Daphine Deutscher, MD  [address]  [place]  [place] [address]  Fax: [phone]   Stacie Glaze, M.D. [facility]

## 2011-02-16 NOTE — H&P (Signed)
NAME:  Melissa [name], Melissa [name] NO.:  [id number]   MEDICAL RECORD NO.:  [id number]           PATIENT TYPE:   LOCATION:                               FACILITY:  [facility]   PHYSICIAN:  Ollen Gross, M.D.         DATE OF BIRTH:   DATE OF ADMISSION:  DATE OF DISCHARGE:                              HISTORY & PHYSICAL   CHIEF COMPLAINT:  Left knee pain.   HISTORY OF PRESENT ILLNESS:  The patient is a 60 year old female who has  been seen by Dr. Lequita Halt for progressive left knee pain.  She has  previously undergone a left total knee back in February of 2006.  Unfortunately, she started developing knee pain.  She was sent for a  bone scan which showed stress reaction in the medial proximal tibia.  It  is felt that she has started to have some loosening or failure of the  tibial component of the left knee, as it previously happened on the  right knee.  It is felt she would best be served by undergoing revision  of the tibial component.  Risks and benefits have been discussed and she  elects to proceed with surgery.   ALLERGIES:  No known drug allergies.   CURRENT MEDICATIONS:  1. Toprol XL.  2. Micardis 80/25.  3. Tapazole.  4. Tylenol.  5. Voltaren.   PAST MEDICAL HISTORY:  1. Past history of MRSA infection.  2. Hypertension.  3. Osteoarthritis.  4. Postmenopausal.  5. Hyperthyroidism.   PAST SURGICAL HISTORY:  1. Right total knee with subsequent right total knee tibial revision.  2. Left total knee.  3. Cholecystectomy.   FAMILY HISTORY:  Mother with history of hypertension, also two sisters  with hypertension, one sister with rheumatoid arthritis.   SOCIAL HISTORY:  Married.  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  Nonsmoker.  Social intake of alcohol.  One child.  Husband will be  assisting with care after surgery.   REVIEW OF SYSTEMS:  GENERAL:  No fevers, chills, or night sweats.  NEUROLOGIC:  No seizures, syncope, paralysis.  RESPIRATORY:  No  shortness  of breath, productive cough, or hemoptysis.  CARDIOVASCULAR:  No chest pain, angina, or orthopnea.  GASTROINTESTINAL:  No nausea,  vomiting, diarrhea, or constipation.  GENITOURINARY:  No dysuria,  hematuria, or discharge.  MUSCULOSKELETAL:  Left knee.   PHYSICAL EXAMINATION:  VITAL SIGNS:  Pulse 64, respirations 12, blood  pressure 142/82.  GENERAL:  A 60 year old female, well-nourished, well-developed, no acute  distress.  Anxious, slightly overweight.  She is alert and oriented,  cooperative, pleasant, accompanied by her husband.  HEENT:  Normocephalic, atraumatic.  Pupils are round and reactive.  Oropharynx clear.  EOMs intact.  NECK:  Supple.  No bruits.  CHEST:  Clear.  HEART:  Regular rhythm.  No murmurs.  S1/S2.  ABDOMEN:  Soft, slightly round.  Bowel sounds present.  RECTAL/BREASTS/GENITALIA:  Not done, not pertinent to present illness.  EXTREMITIES:  Left previous incision well-healed.  Range of motion 0-  120.  Painful medially.  There is malalignment deformity.  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   PLAN:  Patient is admitted to [facility] to undergo revision,  left tibial component of the left knee.  Surgery will be performed by  Dr. Ollen Gross.      Alexzandrew L. Julien Girt, P.A.      Ollen Gross, M.D.  Electronically Signed    ALP/MEDQ  D:  10/27/2006  T:  10/27/2006  Job:  [id number]   cc:   Ollen Gross, M.D.  Fax: [phone]   Valetta Mole. Swords, MD  [address] [place]  [place] [address]

## 2011-02-16 NOTE — H&P (Signed)
NAME:  Melissa [name], Melissa [name]             ACCOUNT NO.:  [id number]   MEDICAL RECORD NO.:  [id number]          PATIENT TYPE:  INP   LOCATION:  NA                           FACILITY:  [facility]   PHYSICIAN:  Ollen Gross, M.D.    DATE OF BIRTH:  1951-08-31   DATE OF ADMISSION:  11/26/2005  DATE OF DISCHARGE:                                HISTORY & PHYSICAL   CHIEF COMPLAINT:  Right knee pain.   PRIMARY CARE PHYSICIAN:  Valetta Mole. Swords, M.D.   HISTORY OF PRESENT ILLNESS:  The patient is a 60 year old female well known  to Dr. Ollen Gross, having previously undergone bilateral total knee  replacement arthroplasties in the past.  The left knee was done most  recently in February of 2006.  The right knee was put in a couple of years  ago (almost 2 years ago).  Unfortunately, she developed some increasing pain  over the past several weeks. She was brought in to the clinic and found that  she had some subsiding of the right tibial tray and it started to fall into  varus with notable loosening.  Due to these significant findings it was  discussed with her about revision procedure. She had some laboratory work  including CBC and sed rate done.  Her white count was normal and her sed  rate was only 24.  She did not have any clinical signs indicating infection.  It was felt to be just more of a loosening.  It was felt she would require  revision surgery.  The risks and benefits were discussed and the patient is  subsequently admitted to the hospital.   ALLERGIES:  No known drug allergies.   CURRENT MEDICATIONS:  1.  Toprol XL 100 mg.  2.  Micardis 80/25 daily.  3.  Tapazole 10 mg daily.  4.  Tylenol PRN.   PAST MEDICAL HISTORY:  Past history of methicillin resistant Staphylococcus  aureus infection, hypertension, osteoarthritis, postmenopausal,  hyperthyroidism.   PAST SURGICAL HISTORY:  Left total knee arthroplasty, right total knee  arthroplasty, cholecystectomy.   FAMILY HISTORY:  Mother  with history of hypertension, also two sisters with  hypertension and one sister with rheumatoid arthritis.   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  She is a nonsmoker.  Social intake of alcohol, one child, husband  will be assisting with her care after surgery.   REVIEW OF SYSTEMS:  GENERAL:  No fever, chills, night sweats. NEUROLOGICAL:  No seizure, syncope, paralysis.  RESPIRATORY:  No shortness of breath,  productive cough or hemoptysis.  CARDIOVASCULAR:  No chest pain, angina or  orthopnea.  GASTROINTESTINAL:  No nausea, vomiting, diarrhea or  constipation. GENITOURINARY:  No dysuria, hematuria or discharge.  MUSCULOSKELETAL:  Right knee as above.   PHYSICAL EXAMINATION:  VITAL SIGNS:  Pulse 76, respirations 12, blood  pressure 154/80.  GENERAL APPEARANCE:  60 year old white female well-nourished, well-  developed, mildly anxious in no acute distress.  She is accompanied by her  husband.  HEENT:  Normocephalic, atraumatic.  Pupils equal, round, reactive.  Oropharynx clear. Extraocular muscles intact.  NECK:  Supple.  CHEST:  Clear anterior and posterior chest with no rhonchi, rales or  wheezing.  HEART:  Regular rate and rhythm with no murmurs.  S1, S2 normal.  ABDOMEN:  Soft, slightly round, nontender.  Bowel sounds present.  RECTAL, BREASTS: GENITOURINARY:  Not done, not pertinent to present illness.  EXTREMITIES:  Right knee shows she does have some moderate tenderness noted  over the medial tibial plateau region with some pain over the lateral  structures of the knee.  She does have full extension, flexion back up to  about 110 to 115 degrees.  There is some swelling noted.   IMPRESSION:  Loose right total knee arthroplasty tibial component.   PLAN:  Patient is admitted to [facility] to undergo a right tibial  revision versus a right total knee arthroplasty revision.  Surgery will be  performed by Dr. Ollen Gross.      Alexzandrew L. Julien Girt, P.A.      Ollen Gross, M.D.  Electronically Signed    ALP/MEDQ  D:  11/25/2005  T:  11/26/2005  Job:  [id number]   cc:   Valetta Mole. Swords, M.D. [facility]  [address] [place]  [place] [address]

## 2011-07-09 LAB — CBC
HCT: 35.8 — ABNORMAL LOW
Hemoglobin: 12.2
MCHC: 34.1
MCHC: 34.5
MCV: 89.4
MCV: 90.1
Platelets: 214
Platelets: 217
RBC: 3.97
RDW: 13.4
WBC: 14.1 — ABNORMAL HIGH
WBC: 6.3

## 2011-07-09 LAB — COMPREHENSIVE METABOLIC PANEL
ALT: 16
AST: 19
Albumin: 3.6
Alkaline Phosphatase: 126 — ABNORMAL HIGH
BUN: 17
CO2: 31
Calcium: 9.6
Chloride: 100
Creatinine, Ser: 1.21 — ABNORMAL HIGH
GFR calc Af Amer: 55 — ABNORMAL LOW
GFR calc non Af Amer: 46 — ABNORMAL LOW
Glucose, Bld: 126 — ABNORMAL HIGH
Potassium: 3.9
Sodium: 139
Total Bilirubin: 1.1
Total Protein: 6.8

## 2011-07-09 LAB — BASIC METABOLIC PANEL
BUN: 14
Chloride: 108
Creatinine, Ser: 1.2

## 2011-07-09 LAB — DIFFERENTIAL
Basophils Absolute: 0
Basophils Relative: 1
Eosinophils Absolute: 0.2
Eosinophils Relative: 4
Lymphocytes Relative: 17
Lymphs Abs: 1.1
Monocytes Absolute: 0.5
Monocytes Relative: 8
Neutro Abs: 4.5
Neutrophils Relative %: 71

## 2011-07-09 LAB — URINALYSIS, DIPSTICK ONLY
Bilirubin Urine: NEGATIVE
Glucose, UA: 250 — AB
Ketones, ur: NEGATIVE
Leukocytes, UA: NEGATIVE
Nitrite: NEGATIVE
Protein, ur: 30 — AB
Specific Gravity, Urine: 1.029
Urobilinogen, UA: 0.2
pH: 5.5

## 2011-07-09 LAB — TYPE AND SCREEN
ABO/RH(D): O POS
Antibody Screen: NEGATIVE

## 2011-07-09 LAB — PREGNANCY, URINE: Preg Test, Ur: NEGATIVE

## 2013-10-01 LAB — HM MAMMOGRAPHY

## 2013-11-29 DIAGNOSIS — I639 Cerebral infarction, unspecified: Secondary | ICD-10-CM

## 2013-11-29 HISTORY — DX: Cerebral infarction, unspecified: I63.9

## 2013-12-02 ENCOUNTER — Emergency Department (HOSPITAL_COMMUNITY): Payer: 59

## 2013-12-02 ENCOUNTER — Inpatient Hospital Stay (HOSPITAL_COMMUNITY)
Admission: EM | Admit: 2013-12-02 | Discharge: 2013-12-08 | DRG: 064 | Disposition: A | Discharge status: Rehabilitation Facility | Payer: 59 | Attending: Neurology | Admitting: Neurology

## 2013-12-02 ENCOUNTER — Encounter (HOSPITAL_COMMUNITY): Payer: Self-pay | Admitting: Emergency Medicine

## 2013-12-02 DIAGNOSIS — Z79899 Other long term (current) drug therapy: Secondary | ICD-10-CM

## 2013-12-02 DIAGNOSIS — Z96659 Presence of unspecified artificial knee joint: Secondary | ICD-10-CM

## 2013-12-02 DIAGNOSIS — I509 Heart failure, unspecified: Secondary | ICD-10-CM | POA: Diagnosis present

## 2013-12-02 DIAGNOSIS — I10 Essential (primary) hypertension: Secondary | ICD-10-CM | POA: Diagnosis present

## 2013-12-02 DIAGNOSIS — I619 Nontraumatic intracerebral hemorrhage, unspecified: Principal | ICD-10-CM | POA: Diagnosis present

## 2013-12-02 DIAGNOSIS — R4701 Aphasia: Secondary | ICD-10-CM | POA: Diagnosis present

## 2013-12-02 DIAGNOSIS — G936 Cerebral edema: Secondary | ICD-10-CM | POA: Diagnosis present

## 2013-12-02 DIAGNOSIS — Z8542 Personal history of malignant neoplasm of other parts of uterus: Secondary | ICD-10-CM

## 2013-12-02 DIAGNOSIS — I639 Cerebral infarction, unspecified: Secondary | ICD-10-CM

## 2013-12-02 DIAGNOSIS — Z823 Family history of stroke: Secondary | ICD-10-CM

## 2013-12-02 DIAGNOSIS — Z6841 Body Mass Index (BMI) 40.0 and over, adult: Secondary | ICD-10-CM

## 2013-12-02 HISTORY — DX: Malignant neoplasm of uterus, part unspecified: C55

## 2013-12-02 HISTORY — DX: Essential (primary) hypertension: I10

## 2013-12-02 LAB — URINALYSIS, ROUTINE W REFLEX MICROSCOPIC
Glucose, UA: NEGATIVE mg/dL
KETONES UR: NEGATIVE mg/dL
Leukocytes, UA: NEGATIVE
Nitrite: NEGATIVE
Protein, ur: >300 mg/dL — AB
UROBILINOGEN UA: 0.2 mg/dL (ref 0.0–1.0)
pH: 5.5 (ref 5.0–8.0)

## 2013-12-02 LAB — URINE MICROSCOPIC-ADD ON

## 2013-12-02 LAB — CBC WITH DIFFERENTIAL/PLATELET
BASOS PCT: 0 % (ref 0–1)
Basophils Absolute: 0 10*3/uL (ref 0.0–0.1)
EOS ABS: 0.2 10*3/uL (ref 0.0–0.7)
EOS PCT: 3 % (ref 0–5)
HCT: 40.3 % (ref 36.0–46.0)
HEMOGLOBIN: 13.7 g/dL (ref 12.0–15.0)
LYMPHS ABS: 1.1 10*3/uL (ref 0.7–4.0)
Lymphocytes Relative: 16 % (ref 12–46)
MCH: 31.4 pg (ref 26.0–34.0)
MCHC: 34 g/dL (ref 30.0–36.0)
MCV: 92.4 fL (ref 78.0–100.0)
MONO ABS: 0.6 10*3/uL (ref 0.1–1.0)
MONOS PCT: 8 % (ref 3–12)
Neutro Abs: 5.1 10*3/uL (ref 1.7–7.7)
Neutrophils Relative %: 73 % (ref 43–77)
Platelets: 149 10*3/uL — ABNORMAL LOW (ref 150–400)
RBC: 4.36 MIL/uL (ref 3.87–5.11)
RDW: 12.7 % (ref 11.5–15.5)
WBC: 7 10*3/uL (ref 4.0–10.5)

## 2013-12-02 LAB — HEPATIC FUNCTION PANEL
ALK PHOS: 105 U/L (ref 39–117)
ALT: 21 U/L (ref 0–35)
AST: 20 U/L (ref 0–37)
Albumin: 3.5 g/dL (ref 3.5–5.2)
BILIRUBIN TOTAL: 0.8 mg/dL (ref 0.3–1.2)
Bilirubin, Direct: <0.2 mg/dL (ref 0.0–0.3)
TOTAL PROTEIN: 7.2 g/dL (ref 6.0–8.3)

## 2013-12-02 LAB — BASIC METABOLIC PANEL
BUN: 18 mg/dL (ref 6–23)
BUN: 19 mg/dL (ref 6–23)
CALCIUM: 7.3 mg/dL — AB (ref 8.4–10.5)
CALCIUM: 9.4 mg/dL (ref 8.4–10.5)
CO2: 32 mEq/L (ref 19–32)
CO2: 30 meq/L (ref 19–32)
CREATININE: 0.97 mg/dL (ref 0.50–1.10)
CREATININE: 1.08 mg/dL (ref 0.50–1.10)
Chloride: 99 mEq/L (ref 96–112)
Chloride: 100 mEq/L (ref 96–112)
GFR calc non Af Amer: 53 mL/min — ABNORMAL LOW (ref >90)
GFR, EST AFRICAN AMERICAN: 71 mL/min — AB (ref >90)
GFR, EST AFRICAN AMERICAN: 62 mL/min — AB (ref >90)
GFR, EST NON AFRICAN AMERICAN: 61 mL/min — AB (ref >90)
GLUCOSE: 101 mg/dL — AB (ref 70–99)
Glucose, Bld: 117 mg/dL — ABNORMAL HIGH (ref 70–99)
POTASSIUM: 4.1 meq/L (ref 3.7–5.3)
Potassium: 4 mEq/L (ref 3.7–5.3)
Sodium: 142 mEq/L (ref 137–147)
Sodium: 143 mEq/L (ref 137–147)

## 2013-12-02 LAB — RAPID URINE DRUG SCREEN, HOSP PERFORMED
Amphetamines: NOT DETECTED
BARBITURATES: NOT DETECTED
BENZODIAZEPINES: NOT DETECTED
COCAINE: NOT DETECTED
Opiates: NOT DETECTED
TETRAHYDROCANNABINOL: NOT DETECTED

## 2013-12-02 LAB — PROTIME-INR
INR: 1.01 (ref 0.00–1.49)
Prothrombin Time: 13.1 seconds (ref 11.6–15.2)

## 2013-12-02 LAB — PROTEIN / CREATININE RATIO, URINE
Creatinine, Urine: 221.82 mg/dL
Protein Creatinine Ratio: 0.18 — ABNORMAL HIGH (ref 0.00–0.15)
Total Protein, Urine: 39.4 mg/dL

## 2013-12-02 LAB — MRSA PCR SCREENING: MRSA BY PCR: NEGATIVE

## 2013-12-02 LAB — TROPONIN I: Troponin I: <0.3 ng/mL (ref <0.30)

## 2013-12-02 MED ORDER — LABETALOL HCL 5 MG/ML IV SOLN
INTRAVENOUS | Status: AC
  Filled 2013-12-02: qty 4

## 2013-12-02 MED ORDER — PANTOPRAZOLE SODIUM 40 MG IV SOLR
40.0000 mg | Freq: Every day | INTRAVENOUS | Status: DC
  Administered 2013-12-02 – 2013-12-03 (×2): 40 mg via INTRAVENOUS
  Filled 2013-12-02 (×3): qty 40

## 2013-12-02 MED ORDER — SODIUM CHLORIDE 0.9 % IV SOLN
INTRAVENOUS | Status: DC
  Administered 2013-12-02 – 2013-12-03 (×2): via INTRAVENOUS
  Administered 2013-12-04: 1 mL via INTRAVENOUS
  Administered 2013-12-05 – 2013-12-07 (×4): via INTRAVENOUS

## 2013-12-02 MED ORDER — ACETAMINOPHEN 650 MG RE SUPP: 650.0000 mg | RECTAL | Status: DC | PRN

## 2013-12-02 MED ORDER — LABETALOL HCL 5 MG/ML IV SOLN
20.0000 mg | Freq: Once | INTRAVENOUS | Status: AC
  Administered 2013-12-02: 20 mg via INTRAVENOUS

## 2013-12-02 MED ORDER — LABETALOL HCL 5 MG/ML IV SOLN
10.0000 mg | INTRAVENOUS | Status: DC | PRN
Start: 2013-12-02 — End: 2013-12-08
  Administered 2013-12-03 (×4): 20 mg via INTRAVENOUS
  Administered 2013-12-03: 10 mg via INTRAVENOUS
  Administered 2013-12-03 (×2): 20 mg via INTRAVENOUS
  Administered 2013-12-03: 10 mg via INTRAVENOUS
  Administered 2013-12-03 – 2013-12-06 (×13): 20 mg via INTRAVENOUS
  Administered 2013-12-06: 40 mg via INTRAVENOUS
  Administered 2013-12-06: 20 mg via INTRAVENOUS
  Administered 2013-12-07 (×2): 40 mg via INTRAVENOUS
  Administered 2013-12-08: 20 mg via INTRAVENOUS
  Filled 2013-12-02: qty 4
  Filled 2013-12-02: qty 8
  Filled 2013-12-02: qty 4
  Filled 2013-12-02: qty 8
  Filled 2013-12-02: qty 4
  Filled 2013-12-02: qty 8
  Filled 2013-12-02: qty 4
  Filled 2013-12-02: qty 8
  Filled 2013-12-02 (×4): qty 4
  Filled 2013-12-02: qty 8
  Filled 2013-12-02 (×2): qty 4
  Filled 2013-12-02: qty 8
  Filled 2013-12-02 (×2): qty 4
  Filled 2013-12-02: qty 8

## 2013-12-02 MED ORDER — SENNOSIDES-DOCUSATE SODIUM 8.6-50 MG PO TABS
1.0000 | ORAL_TABLET | Freq: Two times a day (BID) | ORAL | Status: DC
  Administered 2013-12-03 – 2013-12-06 (×5): 1 via ORAL
  Filled 2013-12-02 (×5): qty 1

## 2013-12-02 MED ORDER — ACETAMINOPHEN 325 MG PO TABS
650.0000 mg | ORAL_TABLET | ORAL | Status: DC | PRN
  Filled 2013-12-02: qty 2

## 2013-12-02 MED ORDER — LABETALOL HCL 5 MG/ML IV SOLN
40.0000 mg | Freq: Once | INTRAVENOUS | Status: AC
  Administered 2013-12-02: 40 mg via INTRAVENOUS
  Filled 2013-12-02: qty 8

## 2013-12-02 NOTE — ED Provider Notes (Addendum)
CSN: [id number]     Arrival date & time 12/02/13  1610 History  This chart was scribed for Maudry Diego, MD by Rolanda Lundborg, ED Scribe. This patient was seen in room APA02/APA02 and the patient's care was started at 4:35 PM.    Chief Complaint  Patient presents with  . Near Syncope  . Altered Mental Status   Level 5 Caveat - condition of the pt  The history is provided by the EMS personnel. The history is limited by the condition of the patient. No language interpreter was used.   HPI Comments: Melissa [name] is a 63 y.o. female who presents to the Emergency Department complaining of altered mental status. Per EMS, pt left the dentist at 3:30 and went into [employer] to use the restroom. She came back out and was trying to get in her car. Per [facility] staff, pt fell to a seated position. Upon EMS arrival, pt was confused and having slurred speech. Per dentist, pt was fine when she left the office. They did not give any medications.    No past medical history on file. No past surgical history on file. No family history on file. History  Substance Use Topics  . Smoking status: Not on file  . Smokeless tobacco: Not on file  . Alcohol Use: Not on file   OB History   No data available     Review of Systems  Unable to perform ROS: Mental status change      Allergies  Review of patient's allergies indicates not on file.  Home Medications  No current outpatient prescriptions on file. BP 161/75  Pulse 57  Temp(Src) 97.6 F (36.4 C) (Oral)  Resp 18  Ht 5\' 10"  (1.778 m)  Wt 200 lb (90.719 kg)  BMI 28.70 kg/m2  SpO2 93% Physical Exam  Constitutional: She appears well-developed.  HENT:  Head: Normocephalic.  Eyes: Conjunctivae and EOM are normal. No scleral icterus.  Neck: Neck supple. No thyromegaly present.  Cardiovascular: Normal rate and regular rhythm.  Exam reveals no gallop and no friction rub.   No murmur heard. Pulmonary/Chest: No stridor. She has no wheezes.  She has no rales. She exhibits no tenderness.  Abdominal: She exhibits no distension. There is no tenderness. There is no rebound.  Musculoskeletal: Normal range of motion. She exhibits no edema.  Lymphadenopathy:    She has no cervical adenopathy.  Neurological: She is alert. She exhibits normal muscle tone. Coordination normal.  Profound weakness in the right arm. Will follow some commands. Alert but not oriented to anything.  Skin: No rash noted. No erythema.  Psychiatric: She has a normal mood and affect. Her behavior is normal.    ED Course  Procedures (including critical care time) Medications - No data to display  DIAGNOSTIC STUDIES: Oxygen Saturation is 93% on RA, adequate by my interpretation.    COORDINATION OF CARE: 4:40 PM- Discussed treatment plan with pt. Pt agrees to plan.    Labs Review Labs Reviewed  CBC WITH DIFFERENTIAL  BASIC METABOLIC PANEL   Imaging Review No results found.   EKG Interpretation   Date/Time:  Wednesday December 02 2013 16:17:22 EST Ventricular Rate:  56 PR Interval:  148 QRS Duration: 76 QT Interval:  446 QTC Calculation: 430 R Axis:   -9 Text Interpretation:  Sinus bradycardia Low voltage QRS Septal infarct  (cited on or before 23-Nov-2005) Inferior infarct , age undetermined  Abnormal ECG When compared with ECG of 23-Nov-2005 14:33, Questionable  change in initial forces of Anteroseptal leads Confirmed by Jhamal Plucinski  MD,  Churchill Grimsley [phone]) on 12/02/2013 5:27:25 PM     .edcr CRITICAL CARE Performed by: Adali Pennings L Total critical care time: 40 Critical care time was exclusive of separately billable procedures and treating other patients. Critical care was necessary to treat or prevent imminent or life-threatening deterioration. Critical care was time spent personally by me on the following activities: development of treatment plan with patient and/or surrogate as well as nursing, discussions with consultants, evaluation of patient's  response to treatment, examination of patient, obtaining history from patient or surrogate, ordering and performing treatments and interventions, ordering and review of laboratory studies, ordering and review of radiographic studies, pulse oximetry and re-evaluation of patient's condition.  MDM  I spoke with neurology.  Will transfer pt to [place] Final diagnoses:  None   The chart was scribed for me under my direct supervision.  I personally performed the history, physical, and medical decision making and all procedures in the evaluation of this patient.Maudry Diego, MD 12/10/13 1538  Maudry Diego, MD 12/10/13 [phone]

## 2013-12-02 NOTE — H&P (Addendum)
Neurology H&P  CC: Confusion  History is obtained from:PAtient, husband  HPI: Melissa [name] is a 63 y.o. female who was in her normal state of health earlier today. She had a dentist appt at 2:30 pm and then left to go home. She stopped by [employer] to use the restorrom, and after using it, sat down and became confused. EMS was called and she was taken to [facility] where a CT head showed a left thalamic hemorrhage with IVH.   Of note, she has also noticed swelling in her legs for the past couple of days and her urine has proteinuria.    LKW: 2:30 pm tpa given?: no, hemorrhage  ROS: Difficult to obtain due to limited responses.    Past Medical History  Diagnosis Date  . Hypertension   . Uterine cancer     Family History: Grandfather - stroke  Social History: Tob: denies  Exam: Current vital signs: BP 144/60  Pulse 62  Temp(Src) 97.6 F (36.4 C) (Oral)  Resp 13  Ht 5\' 10"  (1.778 m)  Wt 133.2 kg (293 lb 10.4 oz)  BMI 42.13 kg/m2  SpO2 98% Vital signs in last 24 hours: Temp:  [97.6 F (36.4 C)] 97.6 F (36.4 C) (03/04 1613) Pulse Rate:  [42-67] 62 (03/04 1900) Resp:  [12-19] 13 (03/04 1900) BP: (123-164)/(50-79) 144/60 mmHg (03/04 1900) SpO2:  [93 %-99 %] 98 % (03/04 1900) Weight:  [90.719 kg (200 lb)-133.2 kg (293 lb 10.4 oz)] 133.2 kg (293 lb 10.4 oz) (03/04 1830)  General: in bed, NAD CV: RRR Resp: non-laboured breathing.  Abd: NT, ND Ext: 1+ ptting edema Mental Status: Patient is awake, alert, oriented to person, place, month, year,  She has difficulty followign commands and needs prompting with physical cues. She has difficulty with repetition, and makes frequent word substitutions.  Cranial Nerves: II: Visual Fields are full. Pupils are equal, round, and reactive to light.   III,IV, VI: EOMI without ptosis or diploplia.  V: Facial sensation is symmetric to temperature VII: Facial movement is notable for mild right weakness. VIII: hearing is intact  to voice X: Uvula elevates symmetrically XI: Shoulder shrug is symmetric. XII: tongue is midline without atrophy or fasciculations.  Motor: Tone is normal. Bulk is normal. 5/5 strength on the left. 4/5 in the right arm and leg.  Sensory: Sensation is decreased on the right arm to temp, intact in legs and left arm.  Deep Tendon Reflexes: Depressed throughout Cerebellar: FNF and HKS are intact on right, consistetn with weakness on left.    I have reviewed labs in epic and the results pertinent to this consultation are: INR 1 nml albumin.  Elevated urine protein  I have reviewed the images obtained:CT head - left thalamic hemorrhage.   Impression: 63 yo F with thalamic hemorrhage. I suspect hypertensive hemorrhage given the appearance. She will need close ICU observation and hypertension control. No indication for surgical consultation at this time.   I suspect her proteinuria is new given that she has only had leg swelling for a couple of days.   Recommendations: 1) ICH - BP goal < 269 systolic, will use IV labetalol. Repeat head CT in the AM or earlier if any decline.  Avoid anticoagulatn/antithrombotics.  2) Proteinuria - could consider nephrology consult in the AM. Will send urine protein/creatinien ratio. Check A1C. 3) Hypocalcemia - will recheck given multiple lab inconsistencies at the time of that lab check, if continues to be low will replete.  4)  Will need PT, OT, ST evaluation. NPO pending eval.  5) SCDs for prophylaxis.    This patient is critically ill and at significant risk of neurological worsening, death and care requires constant monitoring of vital signs, hemodynamics,respiratory and cardiac monitoring, neurological assessment, discussion with family, other specialists and medical decision making of high complexity. I spent 60 minutes of neurocritical care time  in the care of  this patient.  Roland Rack, MD [facility] [phone]  If 7pm-  7am, please page neurology on call at [phone].  12/02/2013  9:06 PM

## 2013-12-02 NOTE — ED Provider Notes (Deleted)
CSN: [id number]     Arrival date & time 12/02/13  1610 History   First MD Initiated Contact with Patient 12/02/13 1635     Chief Complaint  Patient presents with  . Near Syncope  . Altered Mental Status     (Consider location/radiation/quality/duration/timing/severity/associated sxs/prior Treatment) Patient is a 63 y.o. female presenting with altered mental status. The history is provided by the EMS personnel (pt found confused and brought to er).  Altered Mental Status Severity:  Severe Most recent episode:  Today Timing:  Constant Chronicity:  New Context: not alcohol use and not dementia     No past medical history on file. No past surgical history on file. No family history on file. History  Substance Use Topics  . Smoking status: Not on file  . Smokeless tobacco: Not on file  . Alcohol Use: Not on file   OB History   No data available     Review of Systems    Allergies  Review of patient's allergies indicates not on file.  Home Medications  No current outpatient prescriptions on file. BP 154/79  Pulse 58  Temp(Src) 97.6 F (36.4 C) (Oral)  Resp 14  Ht 5\' 10"  (1.778 m)  Wt 200 lb (90.719 kg)  BMI 28.70 kg/m2  SpO2 97% Physical Exam  ED Course  Procedures (including critical care time) Labs Review Labs Reviewed  CBC WITH DIFFERENTIAL - Abnormal; Notable for the following:    Platelets 149 (*)    All other components within normal limits  BASIC METABOLIC PANEL - Abnormal; Notable for the following:    Chloride 114 (*)    Glucose, Bld 101 (*)    Calcium 7.3 (*)    GFR calc non Af Amer 61 (*)    GFR calc Af Amer 71 (*)    All other components within normal limits  PROTIME-INR  HEPATIC FUNCTION PANEL  TROPONIN I  URINALYSIS, ROUTINE W REFLEX MICROSCOPIC  URINE RAPID DRUG SCREEN (HOSP PERFORMED)   Imaging Review No results found.   EKG Interpretation   Date/Time:  Wednesday December 02 2013 16:17:22 EST Ventricular Rate:  56 PR Interval:   148 QRS Duration: 76 QT Interval:  446 QTC Calculation: 430 R Axis:   -9 Text Interpretation:  Sinus bradycardia Low voltage QRS Septal infarct  (cited on or before 23-Nov-2005) Inferior infarct , age undetermined  Abnormal ECG When compared with ECG of 23-Nov-2005 14:33, Questionable  change in initial forces of Anteroseptal leads Confirmed by Nataliah Hatlestad  MD,  Lissete Maestas [phone]) on 12/02/2013 5:27:25 PM     CRITICAL CARE Performed by: Addam Goeller L Total critical care time: 45 Critical care time was exclusive of separately billable procedures and treating other patients. Critical care was necessary to treat or prevent imminent or life-threatening deterioration. Critical care was time spent personally by me on the following activities: development of treatment plan with patient and/or surrogate as well as nursing, discussions with consultants, evaluation of patient's response to treatment, examination of patient, obtaining history from patient or surrogate, ordering and performing treatments and interventions, ordering and review of laboratory studies, ordering and review of radiographic studies, pulse oximetry and re-evaluation of patient's condition.   MDM   Final diagnoses:  Stroke    Cerebral hem.   I spoke with dr. Nicole Kindred and [facility] The chart was scribed for me under my direct supervision.  I personally performed the history, physical, and medical decision making and all procedures in the  evaluation of this patient.Maudry Diego, MD 12/02/13 1728

## 2013-12-02 NOTE — ED Notes (Signed)
Per [employer], keep pt BP below 098 systolic. Pt bp not below 119 systolic after 20mg  of labetolol admin. EDP aware and give verbal order to admin 40 mg of labetolol.

## 2013-12-02 NOTE — ED Notes (Addendum)
Critical Value Received after pt left [facility] ED. Receiving RN,Sydney, at 17M-04 called and notified of labs (Potassium 4.1, Chloride 99)

## 2013-12-02 NOTE — ED Notes (Signed)
Could not finish triage. Pt not able to answer questions at this time.

## 2013-12-02 NOTE — ED Notes (Addendum)
Per EMS, pt left her dentist and went in to [employer] to use the restroom. Pt came back out and was trying to get in her car. Per [facility] staff, pt fell to a sitted position. Upon EMS arrival, pt confused,slurred speech. EMS reports pt CBG en route 103. Pt lethargic, easily arousable but not answering orientation questions appropriately.

## 2013-12-03 ENCOUNTER — Inpatient Hospital Stay (HOSPITAL_COMMUNITY): Payer: 59

## 2013-12-03 DIAGNOSIS — I6350 Cerebral infarction due to unspecified occlusion or stenosis of unspecified cerebral artery: Secondary | ICD-10-CM

## 2013-12-03 LAB — HEMOGLOBIN A1C
HEMOGLOBIN A1C: 6.9 % — AB (ref <5.7)
Mean Plasma Glucose: 151 mg/dL — ABNORMAL HIGH (ref <117)

## 2013-12-03 MED ORDER — HYDROCHLOROTHIAZIDE 12.5 MG PO CAPS
12.5000 mg | ORAL_CAPSULE | Freq: Every day | ORAL | Status: DC
  Administered 2013-12-03 – 2013-12-08 (×6): 12.5 mg via ORAL
  Filled 2013-12-03 (×6): qty 1

## 2013-12-03 MED ORDER — OLMESARTAN MEDOXOMIL-HCTZ 40-12.5 MG PO TABS: 1.0000 | ORAL_TABLET | Freq: Every day | ORAL | Status: DC

## 2013-12-03 MED ORDER — IRBESARTAN 300 MG PO TABS
300.0000 mg | ORAL_TABLET | Freq: Every day | ORAL | Status: DC
  Administered 2013-12-03 – 2013-12-08 (×6): 300 mg via ORAL
  Filled 2013-12-03 (×6): qty 1

## 2013-12-03 MED ORDER — METOPROLOL TARTRATE 100 MG PO TABS
100.0000 mg | ORAL_TABLET | Freq: Two times a day (BID) | ORAL | Status: DC
  Administered 2013-12-03 – 2013-12-08 (×11): 100 mg via ORAL
  Filled 2013-12-03 (×13): qty 1

## 2013-12-03 MED ORDER — BIOTENE DRY MOUTH MT LIQD
15.0000 mL | Freq: Two times a day (BID) | OROMUCOSAL | Status: DC
  Administered 2013-12-03 – 2013-12-08 (×11): 15 mL via OROMUCOSAL

## 2013-12-03 NOTE — Evaluation (Signed)
Speech Language Pathology Evaluation Patient Details Name: Melissa [name] MRN: [id number] DOB: [birth year] Today's Date: 12/03/2013 Time: 0940-1000 SLP Time Calculation (min): 20 min  Problem List:  Patient Active Problem List   Diagnosis Date Noted  . Cerebral hemorrhage, acute 12/02/2013  . 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 ADENOCARCINOMA, ENDOMETRIUM 08/11/2007  . HYPERTHYROIDISM 05/16/2007  . HYPERTENSION 05/16/2007  . OSTEOARTHRITIS 05/16/2007   Past Medical History:  Past Medical History  Diagnosis Date  . Hypertension   . Uterine cancer    Past Surgical History:  Past Surgical History  Procedure Laterality Date  . Replacement total knee bilateral    . Cholecystectomy    . Abdominal hysterectomy     HPI:  63 y.o. female who was in her normal state of health earlier today. She had a dentist appt at 2:30 pm and then left to go home. She stopped by a [employer] to use the restroom, and after using it, sat down and became confused. EMS was called and she was taken to [place] where a CT head showed a left thalamic hemorrhage with IVH.  CXR findings consistent with low-grade CHF with mild pulmonary interstitial edema.    Assessment / Plan / Recommendation Clinical Impression  Pt. exhibiting moderate aphasia (expressive >receptive) with transient emergent awareness of errors.  Expression consists of semantic paraphasias, peseveration and occassional neologisms.  Pt. lethargic with challenges with sustained attention, higher level problem solving.  SLP demonstrated use of strategies for dysnomia and ways to facilitate verbal expression.  ST will continue to work with pt. to facilitate communication and cognition.     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 office)   Cognition  Overall Cognitive Status: Within Functional Limits for tasks assessed Arousal/Alertness: Lethargic Orientation Level: Oriented to place;Oriented to time;Oriented to situation (disoriented to birthdate) Attention: Sustained Sustained Attention: Impaired Sustained Attention Impairment: Verbal basic;Functional basic Memory: Impaired Memory Impairment: Prospective memory;Decreased short term memory Awareness: Impaired Awareness Impairment: Emergent impairment;Anticipatory impairment Problem Solving: Impaired Problem Solving Impairment: Verbal basic Safety/Judgment:  (TBA)    Comprehension  Auditory Comprehension Overall Auditory Comprehension: Impaired Yes/No Questions: Impaired Basic Biographical Questions: 26-50% accurate Commands: Impaired Two Step Basic Commands: 25-49% accurate Interfering Components: Attention Visual Recognition/Discrimination Discrimination: Not tested Reading Comprehension Reading Status: Impaired (visualizes words, decr comprehension) Interfering Components: Attention    Expression Expression Primary Mode of Expression: Verbal Verbal Expression Overall Verbal Expression: Impaired Initiation: Impaired Automatic Speech: Month of year (cues required for months) Level of Generative/Spontaneous Verbalization: Sentence Repetition:  (TBA) Naming: Impairment Confrontation: Impaired Verbal Errors: Semantic paraphasias;Not aware of errors;Aware of errors;Neologisms;Perseveration Pragmatics: No impairment Written Expression Dominant Hand: Right Written Expression: Not tested (TBA)   Oral / Motor Oral Motor/Sensory Function Overall Oral Motor/Sensory Function: Impaired Labial ROM: Reduced  right Labial Symmetry: Abnormal symmetry right Labial Strength: Reduced Lingual ROM: Within Functional Limits Lingual Symmetry: Within Functional Limits Lingual Strength: Reduced Facial ROM: Reduced right Facial Symmetry: Within Functional Limits Facial Strength: Reduced Velum: Within Functional Limits Mandible: Within Functional Limits Motor Speech Overall Motor Speech: Impaired Respiration: Impaired  Level of Impairment: Sentence Phonation: Low vocal intensity Resonance: Within functional limits Articulation: Within functional limitis Intelligibility: Intelligibility reduced Word: 75-100% accurate Motor Planning: Witnin functional limits   GO     Melissa [name] [employer] [phone]  12/03/2013

## 2013-12-03 NOTE — Evaluation (Signed)
Clinical/Bedside Swallow Evaluation Patient Details  Name: Melissa [name] MRN: [id number] Date of Birth: [birth year]  Today's Date: 12/03/2013 Time: 0940-1000 SLP Time Calculation (min): 20 min  Past Medical History:  Past Medical History  Diagnosis Date  . Hypertension   . Uterine cancer    Past Surgical History:  Past Surgical History  Procedure Laterality Date  . Replacement total knee bilateral    . Cholecystectomy    . Abdominal hysterectomy     HPI:  63 y.o. female who was in her normal state of health earlier today. She had a dentist appt at 2:30 pm and then left to go home. She stopped by a [employer] to use the restroom, and after using it, sat down and became confused. EMS was called and she was taken to [place] where a CT head showed a left thalamic hemorrhage with IVH.  CXR findings consistent with low-grade CHF with mild pulmonary interstitial edema.    Assessment / Plan / Recommendation Clinical Impression  Pt. with mild lethargy who consumed varoius consistencies with suspected mild sensory deficits and delayed swallow initiation.  No indications of inadequate pharyngeal mobility appearing safe with small cup sips thin liquids.  Recommend Dys 3 diet texture due to intermittent lethargy and generalized weakness with likely upgrade to regular soon, thin liquids, no straws, small sips, pills whole in applesauce.  ST to follow up for safety.    Aspiration Risk  Mild (mild-mod)    Diet Recommendation Dysphagia 3 (Mechanical Soft);Thin liquid   Liquid Administration via: Cup;No straw Medication Administration: Whole meds with puree Supervision: Patient able to self feed;Full supervision/cueing for compensatory strategies Compensations: Slow rate;Small sips/bites Postural Changes and/or Swallow Maneuvers: Seated upright 90 degrees    Other  Recommendations Oral Care Recommendations: Oral care BID   Follow Up Recommendations  Inpatient Rehab    Frequency and Duration  min 2x/week  2 weeks   Pertinent Vitals/Pain WDL         Swallow Study          Oral/Motor/Sensory Function Overall Oral Motor/Sensory Function: Impaired Labial ROM: Reduced right Labial Symmetry: Abnormal symmetry right Labial Strength: Reduced Lingual ROM: Within Functional Limits Lingual Symmetry: Within Functional Limits Lingual Strength: Reduced Facial ROM: Reduced right Facial Symmetry: Within Functional Limits Facial Strength: Reduced Velum: Within Functional Limits Mandible: Within Functional Limits   Ice Chips Ice chips: Within functional limits   Thin Liquid Thin Liquid: Impaired Pharyngeal  Phase Impairments: Suspected delayed Swallow    Nectar Thick Nectar Thick Liquid: Not tested   Honey Thick Honey Thick Liquid: Not tested   Puree     Solid       Solid: Within functional limits       Orbie Pyo [employer] [phone]  12/03/2013

## 2013-12-03 NOTE — Progress Notes (Signed)
PT Cancellation Note  Patient Details Name: Melissa [name] MRN: [id number] DOB: 02-01-1951   Cancelled Treatment:    Reason Eval/Treat Not Completed: Medical issues which prohibited therapy;Patient not medically ready. Pt on bedrest, will re-attempt to evaluate pt when activity orders are updated.    Elie Confer Melbeta , [place]  12/03/2013, 9:46 AM

## 2013-12-03 NOTE — Progress Notes (Signed)
Stroke Team Progress Note  HISTORY Melissa [name] is a 63 y.o. female who was in her normal state of health earlier today 12/02/2013. She had a dentist appt at 2:30 pm and then left to go home. She stopped by a [employer] to use the restorrom, and after using it, sat down and became confused. EMS was called and she was taken to [place] where a CT head showed a left thalamic hemorrhage with IVH. Of note, she has also noticed swelling in her legs for the past couple of days and her urine has proteinuria. Patient was not administerd TPA secondary to hemorrhage. She was admitted to the neuro ICU for further evaluation and treatment.  SUBJECTIVE Her husband is at the bedside.     OBJECTIVE Most recent Vital Signs: Filed Vitals:   12/03/13 0745 12/03/13 0800 12/03/13 0815 12/03/13 0821  BP: 165/63 170/73 168/66 166/61  Pulse: 78 77 76 74  Temp:  98.7 F (37.1 C)    TempSrc:  Oral    Resp: 18 18 18 19   Height:      Weight:      SpO2: 95% 94% 100% 100%   CBG (last 3)  No results found for this basename: GLUCAP,  in the last 72 hours  IV Fluid Intake:   . sodium chloride 75 mL/hr at 12/03/13 0800    MEDICATIONS  . pantoprazole (PROTONIX) IV  40 mg Intravenous QHS  . senna-docusate  1 tablet Oral BID   PRN:  acetaminophen, acetaminophen, labetalol  Diet:  NPO  Activity:  Bedrest DVT Prophylaxis:  SCDs   CLINICALLY SIGNIFICANT STUDIES Basic Metabolic Panel:   Recent Labs Lab 12/02/13 1735 12/02/13 2033  NA 142 143  K 4.1 4.0  CL 99 100  CO2 32 30  GLUCOSE 101* 117*  BUN 18 19  CREATININE 0.97 1.08  CALCIUM 7.3* 9.4   Liver Function Tests:   Recent Labs Lab 12/02/13 1637  AST 20  ALT 21  ALKPHOS 105  BILITOT 0.8  PROT 7.2  ALBUMIN 3.5   CBC:   Recent Labs Lab 12/02/13 1637  WBC 7.0  NEUTROABS 5.1  HGB 13.7  HCT 40.3  MCV 92.4  PLT 149*   Coagulation:   Recent Labs Lab 12/02/13 1637  LABPROT 13.1  INR 1.01   Cardiac Enzymes:   Recent  Labs Lab 12/02/13 1637  TROPONINI <0.30   Urinalysis:   Recent Labs Lab 12/02/13 1743  COLORURINE YELLOW  LABSPEC >1.030*  PHURINE 5.5  GLUCOSEU NEGATIVE  HGBUR MODERATE*  BILIRUBINUR SMALL*  KETONESUR NEGATIVE  PROTEINUR >300*  UROBILINOGEN 0.2  NITRITE NEGATIVE  LEUKOCYTESUR NEGATIVE   Lipid Panel No results found for this basename: chol,  trig,  hdl,  cholhdl,  vldl,  ldlcalc   HgbA1C  No results found for this basename: HGBA1C    Urine Drug Screen:     Component Value Date/Time   LABOPIA NONE DETECTED 12/02/2013 1743   COCAINSCRNUR NONE DETECTED 12/02/2013 1743   LABBENZ NONE DETECTED 12/02/2013 1743   AMPHETMU NONE DETECTED 12/02/2013 1743   THCU NONE DETECTED 12/02/2013 1743   LABBARB NONE DETECTED 12/02/2013 1743    Alcohol Level: No results found for this basename: ETH,  in the last 168 hours   CT of the brain   12/03/2013    1. No significant change in acute intracranial hemorrhage within the left lentiform nucleus. 2. Mild rightward midline shift is stable. 3. Small amount of intraventricular hemorrhage is stable.  12/02/2013    Study is positive for a left laminectomy scratch the study is positive for acute left limit hemorrhage with associated intraventricular blood. Negative for hydrocephalus or midline shift. Mild prominence of left temporal tip is noted.    MRI of the brain    MRA of the brain    2D Echocardiogram    Carotid Doppler    CXR  12/02/2013   The findings are consistent with low-grade CHF with mild pulmonary interstitial edema.     EKG  sinus bradycardia. For complete results please see formal report.   Therapy Recommendations   Physical Exam   Pleasant middle aged obese caucasian lady not in distress.Awake alert. Afebrile. Head is nontraumatic. Neck is supple without bruit. Hearing is normal. Cardiac exam no murmur or gallop. Lungs are clear to auscultation. Distal pulses are well felt. Neurological Exam :  Awake alert oriented x3. Nonfocal and  hesitant speech which marked dysarthria and word finding difficulties. Good comprehension. Impaired naming and repetition. Extraocular moments are full range without nystagmus. Blinks to threat bilaterally. Fundi were not visualized. Vision recent adequate. There is mild right lower facial weakness. Tongue midline. Portal system exam no upper or lower eczema to drift. Mild weakness of right grip and intrinsic hand muscles. Fine finger movements are diminished right. Orbits lefty right upper extremity. The temperature sensation are preserved. Gait was not tested. Plantars are both downgoing. ASSESSMENT Ms. Melissa [name] is a 63 y.o. female presenting with sudden onset confusion. Imaging confirms a left lentiform nucleus hemorrhage with intraventricular extension and cytotoxic cerebral edema. Hemorrhage felt to be secondary to possible accelerated hypertension, unable to confirm BPs obtained by EMS,  workup underway.  On no antithrombotics prior to admission. Patient with resultant expressive aphasia. Stroke work up underway.  Accelerated Hypertension, controlled using prn labetalol for now, hope to resume home meds today, cardene prn proteinuria Uterine cancer Family hx stroke (grandfather)  Hospital day # 1  TREATMENT/PLAN  Continue ICU level care  Maintain SBP < 160 with prn labetolol. Will raise goal to 180 at 24h  ST to check swallow. If passes, will resume home medications. Consider cardene if needed.   MRI and MRA head  Keep in bed today, OOB in am  Discussed with patient and husband and answered questions  Burnetta Sabin, MSN, RN, ANVP-BC, AGPCNP-BC [facility] Pager: [phone] 12/03/2013 8:49 AM This patient is critically ill and at significant risk of neurological worsening, death and care requires constant monitoring of vital signs, hemodynamics,respiratory and cardiac monitoring,review of multiple databases, neurological assessment, discussion with family, other  specialists and medical decision making of high complexity. I spent 30 minutes of neurocritical care time  in the care of  this patient. I have personally obtained a history, examined the patient, evaluated imaging results, and formulated the assessment and plan of care. I agree with the above. Antony Contras, MD

## 2013-12-03 NOTE — Progress Notes (Signed)
UR completed.  Larri Brewton, RN BSN [facility] CCM Trauma/Neuro ICU Case Manager [phone]  

## 2013-12-03 NOTE — Progress Notes (Signed)
Pt's blood pressure remains greater than 160 despite giving labetalol. Notified Burnetta Sabin, NP. Order to change blood pressure goal to Systolic <696. Will continue to monitor.

## 2013-12-03 NOTE — Progress Notes (Signed)
Gave the patient two pills in apple sauce and a few minutes later the patient vomited a small amount. The vomit appeared to be the apple sauce she had previously eaten. Pt's neuro status has not changed. Pt is alert, pupils 2 round reactive and oriented x4 (Yes or no questions due to aphasia). Speech therapist suggested to crush pills next time.  Notified Burnetta Sabin, NP. No new orders received. Will continue to monitor.

## 2013-12-04 ENCOUNTER — Inpatient Hospital Stay (HOSPITAL_COMMUNITY): Payer: 59

## 2013-12-04 LAB — GLUCOSE, CAPILLARY: Glucose-Capillary: 129 mg/dL — ABNORMAL HIGH (ref 70–99)

## 2013-12-04 MED ORDER — PANTOPRAZOLE SODIUM 40 MG PO TBEC
40.0000 mg | DELAYED_RELEASE_TABLET | Freq: Every day | ORAL | Status: DC
  Administered 2013-12-04 – 2013-12-06 (×3): 40 mg via ORAL
  Filled 2013-12-04 (×3): qty 1

## 2013-12-04 NOTE — Progress Notes (Signed)
Rehab Admissions Coordinator Note:  Patient was screened by Leonidus Rowand L for appropriateness for an Inpatient Acute Rehab Consult.  At this time, we are recommending Inpatient Rehab consult.  Fantasha Daniele L 12/04/2013, 9:44 AM  I can be reached at [phone].

## 2013-12-04 NOTE — Progress Notes (Signed)
Stroke Team Progress Note  HISTORY Melissa [name] is a 63 y.o. female who was in her normal state of health earlier today 12/02/2013. She had a dentist appt at 2:30 pm and then left to go home. She stopped by a [employer] to use the restorrom, and after using it, sat down and became confused. EMS was called and she was taken to [place] where a CT head showed a left thalamic hemorrhage with IVH. Of note, she has also noticed swelling in her legs for the past couple of days and her urine has proteinuria. Patient was not administerd TPA secondary to hemorrhage. She was admitted to the neuro ICU for further evaluation and treatment.  SUBJECTIVE Her husband is at the bedside.   Speech yet hesistant. Passed swallow eval  OBJECTIVE Most recent Vital Signs: Filed Vitals:   12/04/13 0500 12/04/13 0600 12/04/13 0700 12/04/13 0800  BP: 164/94 160/86 143/90 168/53  Pulse: 65 69 63 68  Temp:   97.8 F (36.6 C)   TempSrc:   Oral   Resp: 16 16 15 16   Height:      Weight:      SpO2: 100% 100% 92% 98%   CBG (last 3)  No results found for this basename: GLUCAP,  in the last 72 hours  IV Fluid Intake:   . sodium chloride 75 mL/hr at 12/04/13 0800    MEDICATIONS  . antiseptic oral rinse  15 mL Mouth Rinse BID  . irbesartan  300 mg Oral Daily   And  . hydrochlorothiazide  12.5 mg Oral Daily  . metoprolol  100 mg Oral BID  . pantoprazole (PROTONIX) IV  40 mg Intravenous QHS  . senna-docusate  1 tablet Oral BID   PRN:  acetaminophen, acetaminophen, labetalol  Diet:  Dysphagia  Activity:  Bedrest DVT Prophylaxis:  SCDs   CLINICALLY SIGNIFICANT STUDIES Basic Metabolic Panel:   Recent Labs Lab 12/02/13 1735 12/02/13 2033  NA 142 143  K 4.1 4.0  CL 99 100  CO2 32 30  GLUCOSE 101* 117*  BUN 18 19  CREATININE 0.97 1.08  CALCIUM 7.3* 9.4   Liver Function Tests:   Recent Labs Lab 12/02/13 1637  AST 20  ALT 21  ALKPHOS 105  BILITOT 0.8  PROT 7.2  ALBUMIN 3.5   CBC:   Recent  Labs Lab 12/02/13 1637  WBC 7.0  NEUTROABS 5.1  HGB 13.7  HCT 40.3  MCV 92.4  PLT 149*   Coagulation:   Recent Labs Lab 12/02/13 1637  LABPROT 13.1  INR 1.01   Cardiac Enzymes:   Recent Labs Lab 12/02/13 1637  TROPONINI <0.30   Urinalysis:   Recent Labs Lab 12/02/13 1743  COLORURINE YELLOW  LABSPEC >1.030*  PHURINE 5.5  GLUCOSEU NEGATIVE  HGBUR MODERATE*  BILIRUBINUR SMALL*  KETONESUR NEGATIVE  PROTEINUR >300*  UROBILINOGEN 0.2  NITRITE NEGATIVE  LEUKOCYTESUR NEGATIVE   Lipid Panel No results found for this basename: chol,  trig,  hdl,  cholhdl,  vldl,  ldlcalc   HgbA1C  Lab Results  Component Value Date   HGBA1C 6.9* 12/03/2013    Urine Drug Screen:     Component Value Date/Time   LABOPIA NONE DETECTED 12/02/2013 1743   COCAINSCRNUR NONE DETECTED 12/02/2013 1743   LABBENZ NONE DETECTED 12/02/2013 1743   AMPHETMU NONE DETECTED 12/02/2013 1743   THCU NONE DETECTED 12/02/2013 1743   LABBARB NONE DETECTED 12/02/2013 1743    Alcohol Level: No results found for this basename: ETH,  in the last 168 hours   CT of the brain   12/03/2013    1. No significant change in acute intracranial hemorrhage within the left lentiform nucleus. 2. Mild rightward midline shift is stable. 3. Small amount of intraventricular hemorrhage is stable.    12/02/2013    Study is positive for a left laminectomy scratch the study is positive for acute left limit hemorrhage with associated intraventricular blood. Negative for hydrocephalus or midline shift. Mild prominence of left temporal tip is noted.    MRI of the brain    MRA of the brain    2D Echocardiogram    Carotid Doppler    CXR  12/02/2013   The findings are consistent with low-grade CHF with mild pulmonary interstitial edema.     EKG  sinus bradycardia. For complete results please see formal report.   Therapy Recommendations   Physical Exam   Pleasant middle aged obese caucasian lady not in distress.Awake alert. Afebrile. Head  is nontraumatic. Neck is supple without bruit. Hearing is normal. Cardiac exam no murmur or gallop. Lungs are clear to auscultation. Distal pulses are well felt. Neurological Exam :  Awake alert oriented x3. Nonfluent and hesitant speech which marked dysarthria and word finding difficulties. Good comprehension. Impaired naming and repetition.Able to read well. Extraocular moments are full range without nystagmus. Blinks to threat bilaterally. Fundi were not visualized. Vision  seems adequate. There is mild right lower facial weakness. Tongue midline.Motor system exam no upper or lower extremity drift. Mild weakness of right grip and intrinsic hand muscles. Fine finger movements are diminished right. Orbits left over right upper extremity. Touch, pinprick sensation are preserved. Gait was not tested. Plantars are both downgoing. ASSESSMENT Melissa [name] is a 63 y.o. female presenting with sudden onset confusion. Imaging confirms a left lentiform nucleus hemorrhage with intraventricular extension and cytotoxic cerebral edema. Hemorrhage felt to be secondary to possible accelerated hypertension, unable to confirm BPs obtained by EMS,  workup underway.  On no antithrombotics prior to admission. Patient with resultant expressive aphasia. Stroke work up underway.  Accelerated Hypertension, controlled using prn labetalol for now, hope to resume home meds today, cardene prn proteinuria Uterine cancer Family hx stroke (grandfather)  Hospital day # 2  TREATMENT/PLAN  Resume home medications  Maintain SBP < 180 with prn labetolol. Will raise goal to 180    Mobilize out of bed, PT/OT   MRI and MRA head  Transfer to floor bed  Discussed with patient and husband and answered questions  Burnetta Sabin, MSN, RN, ANVP-BC, AGPCNP-BC [facility] Pager: [phone] 12/04/2013 8:50 AM  . I have personally obtained a history, examined the patient, evaluated imaging results, and formulated  the assessment and plan of care. I agree with the above. Antony Contras, MD

## 2013-12-04 NOTE — Progress Notes (Signed)
Inpatient Diabetes Program Recommendations  AACE/ADA: New Consensus Statement on Inpatient Glycemic Control (2013)  Target Ranges:  Prepandial:   less than 140 mg/dL      Peak postprandial:   less than 180 mg/dL (1-2 hours)      Critically ill patients:  140 - 180 mg/dL     Results for CRISOL, MUECKE (MRN [id number]) as of 12/04/2013 07:15  Ref. Range 12/03/2013 02:15  Hemoglobin A1C Latest Range: <5.7 % 6.9 (H)     **Patient admitted with left thalamic hemorrhage.  History of HTN.  No documented history of DM.  **Patient is obese.  Current weight 133 kg.  **Current A1c is 6.9%.  According to ADA Standards of Care, A1c of 6.5% or greater is indicative of positive diagnosis of DM.  Do not see that the MD has addressed patient's A1c with her as of yet.   **MD- Is this a new diagnosis of DM?  If so, please have RNs begin basic DM education with patient before d/c.   Will follow. Wyn Quaker RN, MSN, CDE Diabetes Coordinator Inpatient Diabetes Program Team Pager: [phone] (8a-10p)

## 2013-12-04 NOTE — Evaluation (Signed)
Occupational Therapy Evaluation Patient Details Name: Melissa [name] MRN: [id number] DOB: 03-Sep-1951 Today's Date: 12/04/2013 Time: 2725-3664 OT Time Calculation (min): 42 min  OT Assessment / Plan / Recommendation History of present illness Melissa [name] is a 63 y.o. female who was in her normal state of health earlier today 12/02/2013. She had a dentist appt at 2:30 pm and then left to go home. She stopped by a [employer] to use the restorrom, and after using it, sat down and became confused. EMS was called and she was taken to  where a CT head showed a left thalamic hemorrhage with IVH.    Clinical Impression   Pt admitted with the above diagnosis and has the deficits listed below. Pt would benefit from intense continued OT to increase I with basic adls, increase I with functional mobility and attempt to restore function into the RUE so she can use it to assist with adls and eventually return home with her husband.     OT Assessment  Patient needs continued OT Services    Follow Up Recommendations  CIR;Supervision/Assistance - 24 hour    Barriers to Discharge   husband available 24/7  Equipment Recommendations  3 in 1 bedside comode;Tub/shower bench    Recommendations for Other Services Rehab consult  Frequency  Min 3X/week    Precautions / Restrictions Precautions Precautions: Fall Precaution Comments: difficulty motor planning Rt LE and UE; expressive aphasia  Restrictions Weight Bearing Restrictions: No   Pertinent Vitals/Pain Pt did not c/o pain.  Vitals were stable during OT session    ADL  Eating/Feeding: Performed;Minimal assistance Where Assessed - Eating/Feeding: Chair Grooming: Performed;Minimal assistance Where Assessed - Grooming: Supported sitting Upper Body Bathing: Simulated;Minimal assistance Where Assessed - Upper Body Bathing: Supported sitting Lower Body Bathing: Performed;Maximal assistance Where Assessed - Lower Body Bathing: Supported sit  to stand Upper Body Dressing: Simulated;Minimal assistance Where Assessed - Upper Body Dressing: Supported sitting Lower Body Dressing: Performed;Maximal assistance Where Assessed - Lower Body Dressing: Supported sit to Pharmacist, hospital: Performed;Maximal Dentist Method: Surveyor, minerals: Materials engineer and Hygiene: Performed;+1 Total assistance Where Assessed - Engineer, mining and Hygiene: Standing Transfers/Ambulation Related to ADLs: Pt with R lean when standing and very ataxic with RLE therefore requiring +2 for safety and assist guiding and moving RLE. ADL Comments: Pt using LUE for most adls.  Talked to husband about encouraging pt to use RUE as much as she can for adls if it is safe for her to do so.  Pt with decreased insight to severity of what is going on.  Feel pt is excellent rehab candidate.    OT Diagnosis: Generalized weakness;Cognitive deficits;Hemiplegia dominant side;Ataxia;Apraxia  OT Problem List: Impaired balance (sitting and/or standing);Decreased coordination;Decreased cognition;Decreased safety awareness;Decreased knowledge of use of DME or AE;Impaired sensation;Impaired UE functional use OT Treatment Interventions: Self-care/ADL training;DME and/or AE instruction;Therapeutic activities   OT Goals(Current goals can be found in the care plan section) Acute Rehab OT Goals Patient Stated Goal: none stated OT Goal Formulation: With patient/family Time For Goal Achievement: 12/18/13 Potential to Achieve Goals: Good ADL Goals Pt Will Perform Grooming: with min guard assist;standing (using RUE as much as possible) Pt Will Perform Upper Body Bathing: with supervision;sitting Pt Will Perform Lower Body Bathing: with mod assist;sit to/from stand (assist only when standing) Pt Will Perform Upper Body Dressing: with set-up;sitting Pt Will Perform Lower Body Dressing: with min  assist;sit to/from stand;with caregiver independent in  assisting Pt Will Transfer to Toilet: with min assist;ambulating;bedside commode;regular height toilet Pt Will Perform Toileting - Clothing Manipulation and hygiene: with min assist;sit to/from stand Additional ADL Goal #1: Pt will feed self 50% of meal using RUE with S. Additional ADL Goal #2: Pt will attempt to use RUE during all basic adls tasks with min cues.  Visit Information  Last OT Received On: 12/04/13 Assistance Needed: +2 History of Present Illness: Melissa [name] is a 63 y.o. female who was in her normal state of health earlier today 12/02/2013. She had a dentist appt at 2:30 pm and then left to go home. She stopped [facility] a [employer] to use the restorrom, and after using it, sat down and became confused. EMS was called and she was taken to [place] where a CT head showed a left thalamic hemorrhage with IVH.        Prior [place] expects to be discharged to:: Private residence Living Arrangements: Spouse/significant other Available Help at Discharge: Family;Available 24 hours/day Type of Home: House Home Access: Stairs to enter [employer] of Steps: 2 Entrance Stairs-Rails: None Home Layout: One level Home Equipment: None Additional Comments: pt has walk in shower; has had previous bil TKAs; husband does not believe they have any equipment left from it at this time   Lives With: Spouse Prior Function Level of Independence: Independent Comments: Pt works full time as a Research scientist (life sciences): Receptive difficulties;Expressive difficulties Dominant Hand: Right         Vision/Perception Vision - History Baseline Vision: No visual deficits Patient Visual Report: No change from baseline Vision - Assessment Eye Alignment: Within Functional Limits Vision Assessment: Vision tested Ocular Range of Motion: Within Functional Limits Alignment/Gaze Preference:  Within Defined Limits Tracking/Visual Pursuits: Able to track stimulus in all quads without difficulty Visual Fields: No apparent deficits Perception Perception: Not tested Praxis Praxis: Impaired Praxis Impairment Details: Motor planning   Cognition  Cognition Arousal/Alertness: Awake/alert Behavior During Therapy: Impulsive Overall Cognitive Status: Impaired/Different from baseline Area of Impairment: Following commands;Safety/judgement;Problem solving Following Commands: Follows one step commands inconsistently;Follows one step commands with increased time Safety/Judgement: Decreased awareness of deficits;Decreased awareness of safety Problem Solving: Slow processing;Decreased initiation;Difficulty sequencing;Requires verbal cues;Requires tactile cues General Comments: pt inconsistent with yes/no quesitons.   Difficult to assess due to: Impaired communication    Extremity/Trunk Assessment Upper Extremity Assessment Upper Extremity Assessment: RUE deficits/detail RUE Deficits / Details: Full ROM and strenght WFL.  RUE very ataxic RUE Sensation:  (unable to assess.  LT appears decreased by observation) RUE Coordination: decreased fine motor;decreased gross motor Lower Extremity Assessment Lower Extremity Assessment: Defer to PT evaluation Cervical / Trunk Assessment Cervical / Trunk Assessment: Kyphotic     Mobility Bed Mobility Overal bed mobility: Needs Assistance;+2 for physical assistance Bed Mobility: Supine to Sit Supine to sit: +2 for physical assistance;Min assist;HOB elevated General bed mobility comments: ataxic like movements noted in Rt LE/UE; (A) to bring hips around to EOB and to elevate trunk; pt attempting to (A) with Rt UE but has dificulty grabbing handrail, could grip handrail with hand over hand (A)  Transfers Overall transfer level: Needs assistance Equipment used: 2 person hand held assist Transfers: Sit to/from Stand Sit to Stand: +2 physical  assistance;Mod assist Stand pivot transfers: +2 physical assistance;Max assist General transfer comment: Pt has hard time coordinating any movement in RLE.  Pt with mild R lean when standing.     Exercise  Balance Balance Overall balance assessment: Needs assistance Sitting-balance support: Bilateral upper extremity supported;Feet supported Sitting balance-Leahy Scale: Fair Sitting balance - Comments: initial posterior lean; progressed to sitting EOB at S level Standing balance support: Bilateral upper extremity supported;During functional activity Standing balance-Leahy Scale: Zero Standing balance comment: required max assist to maintain standing.   End of Session OT - End of Session Activity Tolerance: Patient tolerated treatment well Patient left: in chair;with call bell/phone within reach;with family/visitor present Nurse Communication: Mobility status  GO     Glenford Peers 12/04/2013, 1:21 PM [phone]

## 2013-12-04 NOTE — Evaluation (Signed)
Physical Therapy Evaluation Patient Details Name: Melissa [name] MRN: [id number] DOB: [birth year] Today's Date: 12/04/2013 Time: 9371-6967 PT Time Calculation (min): 20 min  PT Assessment / Plan / Recommendation History of Present Illness  RUDI KNIPPENBERG is a 63 y.o. female who was in her normal state of health earlier today 12/02/2013. She had a dentist appt at 2:30 pm and then left to go home. She stopped [facility] a [employer] to use the restorrom, and after using it, sat down and became confused. EMS was called and she was taken to [place] where a CT head showed a left thalamic hemorrhage with IVH.   Clinical Impression  Pt adm due to the above. Presents with limitations in functional mobility secondary to deficits indicated below. Pt to benefit from skilled acute PT to address deficits listed below and maximize functional mobility. Pt to be a great candidate for CIR. Pt with great family support, husband at bedside during PT evaluation. 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     PT Assessment  Patient needs continued PT services    Follow Up Recommendations  CIR    Does the patient have the potential to tolerate intense rehabilitation      Barriers to Discharge        Equipment Recommendations  Rolling walker with 5" wheels    Recommendations for Other Services Rehab consult;OT consult   Frequency Min 4X/week    Precautions / Restrictions Precautions Precautions: Fall Precaution Comments: difficulty motor planning Rt LE and UE; expressive aphasia  Restrictions Weight Bearing Restrictions: No   Pertinent Vitals/Pain Vitals stable t/o session      Mobility  Bed Mobility Overal bed mobility: Needs Assistance;+2 for physical assistance Bed Mobility: Supine to Sit Supine to sit: +2 for physical assistance;Min assist;HOB elevated General bed mobility comments: ataxic like movements noted in Rt LE/UE; (A) to bring hips around to EOB and to elevate trunk;  pt attempting to (A) with Rt UE but has dificulty grabbing handrail, could grip handrail with hand over hand (A)  Transfers Overall transfer level: Needs assistance Equipment used: 2 person hand held assist Transfers: Sit to/from [facility] Sit to Stand: +2 physical assistance;Mod assist Stand pivot transfers: +2 physical assistance;Max assist General transfer comment: performed sit to stand from EOB x2; pt had +loose BM upon initial stand; pt with ataxic movements in Rt LE and UE that prevented her from being able to properly use/sequencing with RW: 2 person (A) to maintain balance and perform pivotal steps; requires max facilitation to advance Rt LE in pivotal steps towards chair Ambulation/Gait General Gait Details: not assessed at this time  Modified Rankin (Stroke Patients Only) Pre-Morbid Rankin Score: No symptoms Modified Rankin: Severe disability    Exercises General Exercises - Lower Extremity Ankle Circles/Pumps: AROM;Both;10 reps;Supine   PT Diagnosis: Difficulty walking  PT Problem List: Decreased activity tolerance;Decreased balance;Decreased mobility;Decreased cognition;Decreased knowledge of use of DME;Decreased safety awareness;Obesity PT Treatment Interventions: DME instruction;Gait training;Functional mobility training;Therapeutic activities;Therapeutic exercise;Balance training;Neuromuscular re-education;Patient/family education     PT Goals(Current goals can be found in the care plan section) Acute Rehab PT Goals Patient Stated Goal: none stated PT Goal Formulation: With patient Time For Goal Achievement: 12/18/13 Potential to Achieve Goals: Good  Visit Information  Last PT Received On: 12/04/13 Assistance Needed: +2 History of Present Illness: Melissa [name] is a 63 y.o. female who was in her normal state of health earlier today 12/02/2013. She had a dentist appt  at 2:30 pm and then left to go home. She stopped by a [facility] to use the restorrom,  and after using it, sat down and became confused. EMS was called and she was taken to [place] where a CT head showed a left thalamic hemorrhage with IVH.        Prior [place] expects to be discharged to:: Private residence Living Arrangements: Spouse/significant other Available Help at Discharge: Family;Available 24 hours/day Type of Home: House Home Access: Stairs to enter [employer] of Steps: 2 Entrance Stairs-Rails: None Home Layout: One level Home Equipment: None Additional Comments: pt has walk in shower; has had previous bil TKAs; husband does not believe they have any equipment left from it at this time   Lives With: Spouse Prior Function Level of Independence: Independent Comments: Pt works full time as a Research scientist (life sciences): Expressive difficulties Dominant Hand: Right    Cognition  Cognition Arousal/Alertness: Awake/alert Behavior During Therapy: Impulsive Overall Cognitive Status: Difficult to assess Area of Impairment: Safety/judgement;Problem solving;Following commands Following Commands: Follows one step commands inconsistently;Follows one step commands with increased time Safety/Judgement: Decreased awareness of deficits;Decreased awareness of safety Problem Solving: Slow processing;Decreased initiation;Difficulty sequencing;Requires verbal cues;Requires tactile cues Difficult to assess due to: Impaired communication    Extremity/Trunk Assessment Upper Extremity Assessment Upper Extremity Assessment: RUE deficits/detail RUE Deficits / Details: Rt UE ataxia noted  RUE Coordination: decreased gross motor Lower Extremity Assessment Lower Extremity Assessment: RLE deficits/detail RLE Deficits / Details: strength is WFL; ataxic like movements   RLE Coordination: decreased gross motor Cervical / Trunk Assessment Cervical / Trunk Assessment: Kyphotic   Balance Balance Overall balance assessment: Needs  assistance Sitting-balance support: Feet supported;Bilateral upper extremity supported Sitting balance-Leahy Scale: Fair Sitting balance - Comments: initial posterior lean; progressed to sitting EOB at S level Postural control: Posterior lean;Right lateral lean Standing balance support: During functional activity;Bilateral upper extremity supported Standing balance-Leahy Scale: Zero Standing balance comment: pt leaning to Rt; requires 2 person (A) to maintain balance  General Comments General comments (skin integrity, edema, etc.): pt wears reading glasses; pt able to track pen movement; no saccades noted   End of Session PT - End of Session Equipment Utilized During Treatment: Gait belt;Oxygen Activity Tolerance: Patient tolerated treatment well Patient left: in chair;with call bell/phone within reach;with family/visitor present Nurse Communication: Mobility status;Precautions  GP     Gustavus Bryant, [place] 12/04/2013, 9:36 AM

## 2013-12-05 MED ORDER — AMLODIPINE BESYLATE 2.5 MG PO TABS
2.5000 mg | ORAL_TABLET | Freq: Every day | ORAL | Status: DC
  Administered 2013-12-06: 2.5 mg via ORAL
  Filled 2013-12-05: qty 1

## 2013-12-05 MED ORDER — AMLODIPINE BESYLATE 5 MG PO TABS
5.0000 mg | ORAL_TABLET | Freq: Every day | ORAL | Status: DC
  Administered 2013-12-05: 5 mg via ORAL
  Filled 2013-12-05: qty 1

## 2013-12-05 NOTE — Progress Notes (Signed)
Physical Therapy Treatment Patient Details Name: Melissa [name] MRN: [id number] DOB: 05/01/1951 Today's Date: 12/05/2013 Time: 8119-1478 PT Time Calculation (min): 24 min  PT Assessment / Plan / Recommendation  History of Present Illness Melissa [name] is a 63 y.o. female who was in her normal state of health earlier today 12/02/2013. She had a dentist appt at 2:30 pm and then left to go home. She stopped by a [employer] to use the restorrom, and after using it, sat down and became confused. EMS was called and she was taken to [place] where a CT head showed a left thalamic hemorrhage with IVH.    PT Comments   Pt progressing with mobility today and able to ambulate short distance with 2 person handheld (A). Pt continues to have some Rt side neglect and leans to the Rt with transfers and mobility. Pt husband present during session and continues to be great support. Pt is a fall risk due to impulsiveness and decr safety awareness. Continue to recommend CIR.   Follow Up Recommendations  CIR     Does the patient have the potential to tolerate intense rehabilitation     Barriers to Discharge        Equipment Recommendations  Rolling walker with 5" wheels    Recommendations for Other Services Rehab consult;OT consult  Frequency Min 4X/week   Progress towards PT Goals Progress towards PT goals: Progressing toward goals  Plan Current plan remains appropriate    Precautions / Restrictions Precautions Precautions: Fall Precaution Comments: difficulty motor planning Rt LE and UE; expressive aphasia  Restrictions Weight Bearing Restrictions: No   Pertinent Vitals/Pain No complaints.     Mobility  Bed Mobility Overal bed mobility: Needs Assistance Bed Mobility: Supine to Sit Supine to sit: Mod assist General bed mobility comments: pt impulsive with supine to sit and tends to neglect Rt UE during bed mobility; max multidirectional cues for sequencing and safety; (A) to achieve full  upright siting position  Transfers Overall transfer level: Needs assistance Equipment used: 2 person hand held assist Transfers: Sit to/from Stand Sit to Stand: +2 physical assistance;Min assist General transfer comment: pt has difficulty coordinating movement with Rt LE/UE; leans to Rt with transfers; cues for sequencing and upright posture; pt very impulsive with transfers and demo decr safety awareness   Ambulation/Gait Ambulation/Gait assistance: +2 physical assistance;Min assist Ambulation Distance (Feet): 12 Feet Assistive device: 2 person hand held assist Gait Pattern/deviations: Shuffle;Staggering right;Narrow base of support Gait velocity: tries to impulsively increase speed and is unsafe  General Gait Details: pt very impulsive and demo decreased safety awareness with ambulating; pt heavily leaning to Rt and unaware during ambulating; cues for safety throughout ambulating; pt fatigues quickly  Modified Rankin (Stroke Patients Only) Pre-Morbid Rankin Score: No symptoms Modified Rankin: Moderately severe disability    Exercises General Exercises - Lower Extremity Ankle Circles/Pumps: AROM;Both;10 reps;Seated Long Arc Quad: AROM;Both;Strengthening;10 reps   PT Diagnosis:    PT Problem List:   PT Treatment Interventions:     PT Goals (current goals can now be found in the care plan section) Acute Rehab PT Goals Patient Stated Goal: none stated PT Goal Formulation: With patient Time For Goal Achievement: 12/18/13 Potential to Achieve Goals: Good  Visit Information  Last PT Received On: 12/05/13 Assistance Needed: +2 History of Present Illness: Melissa [name] is a 63 y.o. female who was in her normal state of health earlier today 12/02/2013. She had a dentist appt at 2:30 pm and  then left to go home. She stopped by a [employer] to use the restorrom, and after using it, sat down and became confused. EMS was called and she was taken to [place] where a CT head showed a left  thalamic hemorrhage with IVH.     Subjective Data  Subjective: pt lying supine sleeping with husband in room; agreeable to therapy Patient Stated Goal: none stated   Cognition  Cognition Arousal/Alertness: Awake/alert Behavior During Therapy: Impulsive Overall Cognitive Status: Impaired/Different from baseline Area of Impairment: Orientation;Following commands;Safety/judgement;Problem solving Orientation Level: Disoriented to;Situation;Place Following Commands: Follows one step commands inconsistently;Follows one step commands with increased time Safety/Judgement: Decreased awareness of deficits;Decreased awareness of safety Problem Solving: Slow processing;Decreased initiation;Difficulty sequencing;Requires verbal cues;Requires tactile cues General Comments: pt inconsistent with yes/no quesitons.   Difficult to assess due to: Impaired communication    Balance  Balance Overall balance assessment: Needs assistance Sitting-balance support: Feet supported;Single extremity supported Sitting balance-Leahy Scale: Fair Sitting balance - Comments: pt at S level; has slight lean to Rt; correctable with verbal cues  Postural control: Right lateral lean Standing balance support: During functional activity;Bilateral upper extremity supported Standing balance-Leahy Scale: Poor Standing balance comment: leaning to Rt during standing; 2 person HHA to maintain balance   End of Session PT - End of Session Equipment Utilized During Treatment: Gait belt Activity Tolerance: Patient tolerated treatment well Patient left: in chair;with call bell/phone within reach;with family/visitor present;with nursing/sitter in room Nurse Communication: Mobility status;Precautions   GP     Gustavus Bryant, [place] 12/05/2013, 12:52 PM

## 2013-12-05 NOTE — Progress Notes (Signed)
Stroke Team Progress Note  HISTORY Melissa [name] is a 63 y.o. female who was in her normal state of health on 12/02/2013. She had a dentist appt at 2:30 pm and then left to go home. She stopped by a [employer] to use the restorrom, and after using it, sat down and became confused. EMS was called and she was taken to [facility] where a CT head showed a left thalamic hemorrhage with IVH. Of note, she has also noticed swelling in her legs for the past couple of days and her urine has proteinuria. Patient was not administerd TPA secondary to hemorrhage. She was admitted to the neuro ICU for further evaluation and treatment.  SUBJECTIVE Family members present. They feel she is making progress.  OBJECTIVE Most recent Vital Signs: Filed Vitals:   12/05/13 0142 12/05/13 0351 12/05/13 0552 12/05/13 0641  BP: 180/81 190/79 191/79 143/74  Pulse: 72  76 72  Temp: 97.4 F (36.3 C)  99.4 F (37.4 C)   TempSrc: Oral  Axillary   Resp: 18  18   Height:      Weight:      SpO2: 92%  94%    CBG (last 3)   Recent Labs  12/04/13 1153  GLUCAP 129*    IV Fluid Intake:   . sodium chloride 75 mL/hr at 12/05/13 0605    MEDICATIONS  . antiseptic oral rinse  15 mL Mouth Rinse BID  . irbesartan  300 mg Oral Daily   And  . hydrochlorothiazide  12.5 mg Oral Daily  . metoprolol  100 mg Oral BID  . pantoprazole  40 mg Oral QHS  . senna-docusate  1 tablet Oral BID   PRN:  acetaminophen, acetaminophen, labetalol  Diet:  Dysphagia  Activity:  Bedrest DVT Prophylaxis:  SCDs   CLINICALLY SIGNIFICANT STUDIES Basic Metabolic Panel:   Recent Labs Lab 12/02/13 1735 12/02/13 2033  NA 142 143  K 4.1 4.0  CL 99 100  CO2 32 30  GLUCOSE 101* 117*  BUN 18 19  CREATININE 0.97 1.08  CALCIUM 7.3* 9.4   Liver Function Tests:   Recent Labs Lab 12/02/13 1637  AST 20  ALT 21  ALKPHOS 105  BILITOT 0.8  PROT 7.2  ALBUMIN 3.5   CBC:   Recent Labs Lab 12/02/13 1637  WBC 7.0  NEUTROABS 5.1  HGB  13.7  HCT 40.3  MCV 92.4  PLT 149*   Coagulation:   Recent Labs Lab 12/02/13 1637  LABPROT 13.1  INR 1.01   Cardiac Enzymes:   Recent Labs Lab 12/02/13 1637  TROPONINI <0.30   Urinalysis:   Recent Labs Lab 12/02/13 1743  COLORURINE YELLOW  LABSPEC >1.030*  PHURINE 5.5  GLUCOSEU NEGATIVE  HGBUR MODERATE*  BILIRUBINUR SMALL*  KETONESUR NEGATIVE  PROTEINUR >300*  UROBILINOGEN 0.2  NITRITE NEGATIVE  LEUKOCYTESUR NEGATIVE   Lipid Panel No results found for this basename: chol,  trig,  hdl,  cholhdl,  vldl,  ldlcalc   HgbA1C  Lab Results  Component Value Date   HGBA1C 6.9* 12/03/2013    Urine Drug Screen:     Component Value Date/Time   LABOPIA NONE DETECTED 12/02/2013 1743   COCAINSCRNUR NONE DETECTED 12/02/2013 1743   LABBENZ NONE DETECTED 12/02/2013 1743   AMPHETMU NONE DETECTED 12/02/2013 1743   THCU NONE DETECTED 12/02/2013 1743   LABBARB NONE DETECTED 12/02/2013 1743    Alcohol Level: No results found for this basename: ETH,  in the last 168 hours  CT of the brain   12/03/2013     1. No significant change in acute intracranial hemorrhage within the left lentiform nucleus.  2. Mild rightward midline shift is stable.  3. Small amount of intraventricular hemorrhage is stable.    12/02/2013     Study is positive for a left laminectomy scratch the study is positive for acute left limit hemorrhage with associated intraventricular blood. Negative for hydrocephalus or midline shift. Mild prominence of left temporal tip is noted.    MRI of the brain  12/03/2013 Unchanged size of acute hemorrhage centered in the left thalamus with extension into the left lateral ventricle. No hydrocephalus.    MRA of the brain   Moderate chronic small vessel ischemic disease.  No evidence of major intracranial arterial occlusion or high-grade stenosis. Mild bilateral carotid siphon atherosclerosis.  2D Echocardiogram  not indicated  Carotid Doppler  not indicated  CXR  12/02/2013    The findings are consistent with low-grade CHF with mild pulmonary interstitial edema.     EKG  sinus bradycardia. For complete results please see formal report.   Therapy Recommendations - inpatient rehabilitation recommended  Physical Exam   Pleasant middle aged obese caucasian lady not in distress.Awake alert. Afebrile. Head is nontraumatic. Neck is supple without bruit. Hearing is normal. Cardiac exam no murmur or gallop. Lungs are clear to auscultation. Distal pulses are well felt. Neurological Exam :  Awake alert oriented x3. Nonfluent and hesitant speech which marked dysarthria and word finding difficulties. Good comprehension. Impaired naming and repetition.Able to read well. Extraocular moments are full range without nystagmus. Blinks to threat bilaterally. Fundi were not visualized. Vision  seems adequate. There is mild right lower facial weakness. Tongue midline.Motor system exam no upper or lower extremity drift. Mild weakness of right grip and intrinsic hand muscles. Fine finger movements are diminished right. Orbits left over right upper extremity. Touch, pinprick sensation are preserved. Gait was not tested. Plantars are both downgoing.  ASSESSMENT Melissa [name] is a 63 y.o. female presenting with sudden onset confusion. Imaging confirms a left lentiform nucleus hemorrhage with intraventricular extension and cytotoxic cerebral edema. Hemorrhage felt to be secondary to possible accelerated hypertension, unable to confirm BPs obtained by EMS,  workup underway.  On no antithrombotics prior to admission. Patient with resultant expressive aphasia. Stroke work up underway.  Accelerated Hypertension - home meds restarted. Proteinuria Uterine cancer Family hx stroke (grandfather) Hemoglobin A1c 6.9 CHF by admission chest x-ray  Hospital day # 3  TREATMENT/PLAN  Monitor blood pressure - currently on Avapro, hydrochlorothiazide, and metoprolol. Labetalol when necessary. Will add  amlodipine.  Maintain SBP < 180 with prn labetolol. Will raise goal to 180    Mobilize out of bed, PT/OT. Inpatient rehabilitation recommended. Will order consult.  Repeat chest x-ray for CHF. May need 2-D echo.   Mikey Bussing PA-C [facility] Pager [phone] 12/05/2013, 8:13 AM  . I have personally obtained a history, examined the patient, evaluated imaging results, and formulated the assessment and plan of care. I agree with the above.  HTN, added metoprolol. PT recomends CIR. D/C planning

## 2013-12-06 ENCOUNTER — Inpatient Hospital Stay (HOSPITAL_COMMUNITY): Payer: 59

## 2013-12-06 MED ORDER — AMLODIPINE BESYLATE 5 MG PO TABS
5.0000 mg | ORAL_TABLET | Freq: Every day | ORAL | Status: DC
  Administered 2013-12-07 – 2013-12-08 (×2): 5 mg via ORAL
  Filled 2013-12-06 (×2): qty 1

## 2013-12-06 MED ORDER — AMLODIPINE BESYLATE 5 MG PO TABS
5.0000 mg | ORAL_TABLET | Freq: Once | ORAL | Status: AC
  Administered 2013-12-06: 5 mg via ORAL
  Filled 2013-12-06: qty 1

## 2013-12-06 NOTE — Progress Notes (Signed)
Dr. Nicole Kindred aware of pt's recheck BP being 168/65, instructed no interventions at this time d/t BP possibly continuing to decrease.

## 2013-12-06 NOTE — Progress Notes (Signed)
Dr. Nicole Kindred aware of pt's BP 176/61 after Norvasc 5mg  po given.  Instructed to wait another hour and recheck BP.

## 2013-12-06 NOTE — Progress Notes (Signed)
Stroke Team Progress Note  HISTORY Melissa [name] is a 63 y.o. female who was in her normal state of health on 12/02/2013. She had a dentist appt at 2:30 pm and then left to go home. She stopped by a [employer] to use the restorrom, and after using it, sat down and became confused. EMS was called and she was taken to [facility] where a CT head showed a left thalamic hemorrhage with IVH. Of note, she has also noticed swelling in her legs for the past couple of days and her urine has proteinuria. Patient was not administerd TPA secondary to hemorrhage. She was admitted to the neuro ICU for further evaluation and treatment.  SUBJECTIVE Multiple family members present. They feel patient is improving. Inpatient rehabilitation briefly discussed.  OBJECTIVE Most recent Vital Signs: Filed Vitals:   12/05/13 1800 12/05/13 2155 12/06/13 0104 12/06/13 0501  BP: 165/83 169/77 184/66 174/64  Pulse: 68 75 70 65  Temp: 98.8 F (37.1 C) 99.1 F (37.3 C) 98.5 F (36.9 C) 98.6 F (37 C)  TempSrc: Oral Oral Oral Oral  Resp: 18 18 18 18   Height:      Weight:      SpO2: 98% 99% 93% 95%   CBG (last 3)   Recent Labs  12/04/13 1153  GLUCAP 129*    IV Fluid Intake:   . sodium chloride 75 mL/hr at 12/06/13 0111    MEDICATIONS  . amLODipine  2.5 mg Oral Daily  . antiseptic oral rinse  15 mL Mouth Rinse BID  . irbesartan  300 mg Oral Daily   And  . hydrochlorothiazide  12.5 mg Oral Daily  . metoprolol  100 mg Oral BID  . pantoprazole  40 mg Oral QHS  . senna-docusate  1 tablet Oral BID   PRN:  acetaminophen, acetaminophen, labetalol  Diet:  Dysphagia III diet with thin liquids Activity: Up with assistance.  DVT Prophylaxis:  SCDs   CLINICALLY SIGNIFICANT STUDIES Basic Metabolic Panel:   Recent Labs Lab 12/02/13 1735 12/02/13 2033  NA 142 143  K 4.1 4.0  CL 99 100  CO2 32 30  GLUCOSE 101* 117*  BUN 18 19  CREATININE 0.97 1.08  CALCIUM 7.3* 9.4   Liver Function Tests:   Recent  Labs Lab 12/02/13 1637  AST 20  ALT 21  ALKPHOS 105  BILITOT 0.8  PROT 7.2  ALBUMIN 3.5   CBC:   Recent Labs Lab 12/02/13 1637  WBC 7.0  NEUTROABS 5.1  HGB 13.7  HCT 40.3  MCV 92.4  PLT 149*   Coagulation:   Recent Labs Lab 12/02/13 1637  LABPROT 13.1  INR 1.01   Cardiac Enzymes:   Recent Labs Lab 12/02/13 1637  TROPONINI <0.30   Urinalysis:   Recent Labs Lab 12/02/13 1743  COLORURINE YELLOW  LABSPEC >1.030*  PHURINE 5.5  GLUCOSEU NEGATIVE  HGBUR MODERATE*  BILIRUBINUR SMALL*  KETONESUR NEGATIVE  PROTEINUR >300*  UROBILINOGEN 0.2  NITRITE NEGATIVE  LEUKOCYTESUR NEGATIVE   Lipid Panel No results found for this basename: chol,  trig,  hdl,  cholhdl,  vldl,  ldlcalc   HgbA1C  Lab Results  Component Value Date   HGBA1C 6.9* 12/03/2013    Urine Drug Screen:     Component Value Date/Time   LABOPIA NONE DETECTED 12/02/2013 1743   COCAINSCRNUR NONE DETECTED 12/02/2013 1743   LABBENZ NONE DETECTED 12/02/2013 1743   AMPHETMU NONE DETECTED 12/02/2013 1743   THCU NONE DETECTED 12/02/2013 1743  LABBARB NONE DETECTED 12/02/2013 1743    Alcohol Level: No results found for this basename: ETH,  in the last 168 hours   CT of the brain   12/03/2013     1. No significant change in acute intracranial hemorrhage within the left lentiform nucleus.  2. Mild rightward midline shift is stable.  3. Small amount of intraventricular hemorrhage is stable.    12/02/2013     Study is positive for a left laminectomy scratch the study is positive for acute left limit hemorrhage with associated intraventricular blood. Negative for hydrocephalus or midline shift. Mild prominence of left temporal tip is noted.    MRI of the brain  12/03/2013 Unchanged size of acute hemorrhage centered in the left thalamus with extension into the left lateral ventricle. No hydrocephalus.    MRA of the brain   Moderate chronic small vessel ischemic disease.  No evidence of major intracranial  arterial occlusion or high-grade stenosis. Mild bilateral carotid siphon atherosclerosis.  2D Echocardiogram  not indicated  Carotid Doppler  not indicated  CXR  12/02/2013   The findings are consistent with low-grade CHF with mild pulmonary interstitial edema.     EKG  sinus bradycardia. For complete results please see formal report.   Therapy Recommendations - inpatient rehabilitation recommended  Physical Exam   Pleasant middle aged obese caucasian lady not in distress.Awake alert. Afebrile. Head is nontraumatic. Neck is supple without bruit. Hearing is normal. Cardiac exam no murmur or gallop. Lungs are clear to auscultation. Distal pulses are well felt. Neurological Exam :  Awake alert oriented x3. Nonfluent and hesitant speech which marked dysarthria and word finding difficulties. Good comprehension. Impaired naming and repetition.Able to read well. Extraocular moments are full range without nystagmus. Blinks to threat bilaterally. Fundi were not visualized. Vision  seems adequate. There is mild right lower facial weakness. Tongue midline.Motor system exam no upper or lower extremity drift. Mild weakness of right grip and intrinsic hand muscles. Fine finger movements are diminished right. Orbits left over right upper extremity. Touch, pinprick sensation are preserved. Gait was not tested. Plantars are both downgoing.  ASSESSMENT Ms. Melissa [name] is a 63 y.o. female presenting with sudden onset confusion. Imaging confirms a left lentiform nucleus hemorrhage with intraventricular extension and cytotoxic cerebral edema. Hemorrhage felt to be secondary to possible accelerated hypertension, unable to confirm BPs obtained by EMS,  workup underway.  On no antithrombotics prior to admission. Patient with resultant expressive aphasia. Stroke work up underway.  Accelerated Hypertension - home meds restarted. Amlodipine added. Increased to 5 mg daily. Proteinuria Uterine cancer Family hx stroke  (grandfather) Hemoglobin A1c 6.9 CHF by admission chest x-ray - repeat chest x-ray today shows resolution of CHF.  Hospital day # 4  TREATMENT/PLAN  Monitor blood pressure - currently on Avapro, hydrochlorothiazide, and metoprolol. Labetalol when necessary. Amlodipine added.  Maintain SBP < 180 with prn labetolol. Will raise goal to 180    Mobilize out of bed, PT/OT. Inpatient rehabilitation recommended. Rehabilitation M.D. consult pending.   Mikey Bussing PA-C [facility] Pager [phone] 12/06/2013, 8:19 AM  . I have personally obtained a history, examined the patient, evaluated imaging results, and formulated the assessment and plan of care. I agree with the above.  Awaiting rehab evaluation. Leotis Pain

## 2013-12-07 DIAGNOSIS — I619 Nontraumatic intracerebral hemorrhage, unspecified: Secondary | ICD-10-CM

## 2013-12-07 MED ORDER — CLONIDINE HCL 0.1 MG PO TABS
0.1000 mg | ORAL_TABLET | Freq: Once | ORAL | Status: AC
  Administered 2013-12-07: 0.1 mg via ORAL
  Filled 2013-12-07: qty 1

## 2013-12-07 NOTE — Progress Notes (Signed)
Stroke Team Progress Note  HISTORY Melissa [name] is a 63 y.o. female who was in her normal state of health on 12/02/2013. She had a dentist appt at 2:30 pm and then left to go home. She stopped by a [employer] to use the restroom, and after using it, sat down and became confused. EMS was called and she was taken to [facility] where a CT head showed a left thalamic hemorrhage with IVH. Of note, she has also noticed swelling in her legs for the past couple of days and her urine has proteinuria. Patient was not administerd TPA secondary to hemorrhage. She was admitted to the neuro ICU for further evaluation and treatment.  SUBJECTIVE Her husband is at the bedside.   OBJECTIVE Most recent Vital Signs: Filed Vitals:   12/07/13 0051 12/07/13 0141 12/07/13 0252 12/07/13 0555  BP: 154/68 178/75 183/78 164/68  Pulse: 69 68 73 73  Temp:  97.7 F (36.5 C)  98.1 F (36.7 C)  TempSrc:  Oral  Oral  Resp:  18  18  Height:      Weight:      SpO2:  90%  96%   CBG (last 3)   Recent Labs  12/04/13 1153  GLUCAP 129*    IV Fluid Intake:   . sodium chloride 75 mL/hr at 12/07/13 0501    MEDICATIONS  . amLODipine  5 mg Oral Daily  . antiseptic oral rinse  15 mL Mouth Rinse BID  . irbesartan  300 mg Oral Daily   And  . hydrochlorothiazide  12.5 mg Oral Daily  . metoprolol  100 mg Oral BID  . pantoprazole  40 mg Oral QHS  . senna-docusate  1 tablet Oral BID   PRN:  acetaminophen, acetaminophen, labetalol  Diet:  Dysphagia III diet with thin liquids Activity: Up with assistance.  DVT Prophylaxis:  SCDs   CLINICALLY SIGNIFICANT STUDIES Basic Metabolic Panel:   Recent Labs Lab 12/02/13 1735 12/02/13 2033  NA 142 143  K 4.1 4.0  CL 99 100  CO2 32 30  GLUCOSE 101* 117*  BUN 18 19  CREATININE 0.97 1.08  CALCIUM 7.3* 9.4   Liver Function Tests:   Recent Labs Lab 12/02/13 1637  AST 20  ALT 21  ALKPHOS 105  BILITOT 0.8  PROT 7.2  ALBUMIN 3.5   CBC:   Recent Labs Lab  12/02/13 1637  WBC 7.0  NEUTROABS 5.1  HGB 13.7  HCT 40.3  MCV 92.4  PLT 149*   Coagulation:   Recent Labs Lab 12/02/13 1637  LABPROT 13.1  INR 1.01   Cardiac Enzymes:   Recent Labs Lab 12/02/13 1637  TROPONINI <0.30   Urinalysis:   Recent Labs Lab 12/02/13 1743  COLORURINE YELLOW  LABSPEC >1.030*  PHURINE 5.5  GLUCOSEU NEGATIVE  HGBUR MODERATE*  BILIRUBINUR SMALL*  KETONESUR NEGATIVE  PROTEINUR >300*  UROBILINOGEN 0.2  NITRITE NEGATIVE  LEUKOCYTESUR NEGATIVE   Lipid Panel No results found for this basename: chol,  trig,  hdl,  cholhdl,  vldl,  ldlcalc   HgbA1C  Lab Results  Component Value Date   HGBA1C 6.9* 12/03/2013    Urine Drug Screen:     Component Value Date/Time   LABOPIA NONE DETECTED 12/02/2013 1743   COCAINSCRNUR NONE DETECTED 12/02/2013 1743   LABBENZ NONE DETECTED 12/02/2013 1743   AMPHETMU NONE DETECTED 12/02/2013 1743   THCU NONE DETECTED 12/02/2013 1743   LABBARB NONE DETECTED 12/02/2013 1743    Alcohol Level: No results  found for this basename: ETH,  in the last 168 hours   CT of the brain   12/03/2013     1. No significant change in acute intracranial hemorrhage within the left lentiform nucleus.  2. Mild rightward midline shift is stable.  3. Small amount of intraventricular hemorrhage is stable.    12/02/2013     Study is positive for a left laminectomy scratch the study is positive for acute left limit hemorrhage with associated intraventricular blood. Negative for hydrocephalus or midline shift. Mild prominence of left temporal tip is noted.    MRI of the brain  12/03/2013 Unchanged size of acute hemorrhage centered in the left thalamus with extension into the left lateral ventricle. No hydrocephalus.   MRA of the brain   Moderate chronic small vessel ischemic disease. No evidence of major intracranial arterial occlusion or high-grade stenosis. Mild bilateral carotid siphon atherosclerosis.  2D Echocardiogram  not indicated  Carotid  Doppler  not indicated  CXR   12/06/2013. Resolution of previously noted mild congestive heart failure. study demonstrates only borderline cardiomegaly with mild  pulmonary venous congestion, but no frank pulmonary edema.  12/02/2013   The findings are consistent with low-grade CHF with mild pulmonary interstitial edema.     EKG  sinus bradycardia. For complete results please see formal report.   Therapy Recommendations - inpatient rehabilitation   Physical Exam   Pleasant middle aged obese caucasian lady not in distress.Awake alert. Afebrile. Head is nontraumatic. Neck is supple without bruit. Hearing is normal. Cardiac exam no murmur or gallop. Lungs are clear to auscultation. Distal pulses are well felt. Neurological Exam :  Awake alert oriented x3. Nonfluent and hesitant speech which marked dysarthria and word finding difficulties. Good comprehension. Impaired naming and repetition.Able to read well. Extraocular moments are full range without nystagmus. Blinks to threat bilaterally. Fundi were not visualized. Vision  seems adequate. There is mild right lower facial weakness. Tongue midline.Motor system exam no upper or lower extremity drift. Mild weakness of right grip and intrinsic hand muscles. Fine finger movements are diminished right. Orbits left over right upper extremity. Touch, pinprick sensation are preserved. Gait was not tested. Plantars are both downgoing.  ASSESSMENT Melissa [name] is a 63 y.o. female presenting with sudden onset confusion. Imaging confirms a left lentiform nucleus hemorrhage with intraventricular extension and cytotoxic cerebral edema. Hemorrhage felt to be secondary to accelerated hypertension, unable to confirm BPs obtained by EMS but they have been high and difficult to control in the hospital. On no antithrombotics prior to admission. Patient with resultant expressive aphasia and right hemiparesis. Stroke work up completed.  Accelerated Hypertension -  home meds restarted.  currently on Avapro, hydrochlorothiazide, and metoprolol  Amlodipine added/ Increased to 5 mg daily. BP remains elevated, receiving prn labetolol, though has not gone over 190. Proteinuria Uterine cancer Family hx stroke (grandfather) Hemoglobin A1c 6.9 CHF by admission chest x-ray, repeat CXR shows resolution of CHF.  Hospital day # 5  TREATMENT/PLAN  Continue to monitor blood pressure. Do not lower too fast. Use prn labetolol. Clarified systolic blood pressure less than 180.  Rehabilitation M.D. consult ordered. Medically ready for transfer to rehabilitation  Burnetta Sabin, MSN, RN, ANVP-BC, ANP-BC, [facility] Pager: [phone] 12/07/2013 10:42 AM  I have personally obtained a history, examined the patient, evaluated imaging results, and formulated the assessment and plan of care. I agree with the above. Antony Contras, MD

## 2013-12-07 NOTE — Progress Notes (Signed)
Physical Therapy Treatment Patient Details Name: Melissa [name] MRN: [id number] DOB: 04/23/1951 Today's Date: 12/07/2013 Time: 4270-6237 PT Time Calculation (min): 25 min  PT Assessment / Plan / Recommendation  History of Present Illness Melissa [name] is a 63 y.o. female who was in her normal state of health earlier today 12/02/2013. She had a dentist appt at 2:30 pm and then left to go home. She stopped by a [employer] to use the restorrom, and after using it, sat down and became confused. EMS was called and she was taken to [name] where a CT head showed a left thalamic hemorrhage with IVH.    PT Comments   Pt continues to need Max cueing for attending to R side and to slow down during mobility.  Pt impulsive and continues to require 2nd person A for balance and safety.    Follow Up Recommendations  CIR     Does the patient have the potential to tolerate intense rehabilitation     Barriers to Discharge        Equipment Recommendations  Rolling walker with 5" wheels    Recommendations for Other Services Rehab consult  Frequency Min 4X/week   Progress towards PT Goals Progress towards PT goals: Progressing toward goals  Plan Current plan remains appropriate    Precautions / Restrictions Precautions Precautions: Fall Precaution Comments: difficulty motor planning Rt LE and UE; expressive aphasia  Restrictions Weight Bearing Restrictions: No   Pertinent Vitals/Pain Denied pain.      Mobility  Bed Mobility Overal bed mobility: Needs Assistance Bed Mobility: Supine to Sit Supine to sit: Mod assist General bed mobility comments: pt impulsive with supine to sit and tends to neglect Rt UE during bed mobility; max multidirectional cues for sequencing and safety; (A) to achieve full upright siting position  Transfers Overall transfer level: Needs assistance Equipment used: Rolling walker (2 wheeled) Transfers: Sit to/from Stand Sit to Stand: Min assist;+2 physical  assistance General transfer comment: pt needs hand over hand cueing for use of UEs and positioning R LE.  cues for use of UEs and controlling descent to sitting.   Ambulation/Gait Ambulation/Gait assistance: Min assist;+2 physical assistance Ambulation Distance (Feet): 6 Feet (and 8) Assistive device: Rolling walker (2 wheeled) Gait Pattern/deviations: Step-to pattern;Decreased step length - right;Decreased stance time - right General Gait Details: pt impulsive and attempts to ambulate when R LE is flexed and behind L LE.  cues for movement of R LE, however pt not following all cueing for R LE.   Modified Rankin (Stroke Patients Only) Pre-Morbid Rankin Score: No symptoms Modified Rankin: Moderately severe disability    Exercises     PT Diagnosis:    PT Problem List:   PT Treatment Interventions:     PT Goals (current goals can now be found in the care plan section) Acute Rehab PT Goals Patient Stated Goal: none stated Time For Goal Achievement: 12/18/13 Potential to Achieve Goals: Good  Visit Information  Last PT Received On: 12/07/13 Assistance Needed: +2 History of Present Illness: Melissa [name] is a 63 y.o. female who was in her normal state of health earlier today 12/02/2013. She had a dentist appt at 2:30 pm and then left to go home. She stopped by a [employer] to use the restorrom, and after using it, sat down and became confused. EMS was called and she was taken to [place] where a CT head showed a left thalamic hemorrhage with IVH.     Subjective Data  Patient Stated Goal: none stated   Cognition  Cognition Arousal/Alertness: Awake/alert Behavior During Therapy: Impulsive Overall Cognitive Status: Impaired/Different from baseline Area of Impairment: Orientation;Following commands;Safety/judgement;Problem solving Orientation Level: Disoriented to;Situation Following Commands: Follows one step commands inconsistently Safety/Judgement: Decreased awareness of  safety;Decreased awareness of deficits Problem Solving: Slow processing;Decreased initiation;Difficulty sequencing;Requires verbal cues;Requires tactile cues Difficult to assess due to: Impaired communication    Balance  Balance Overall balance assessment: Needs assistance Postural control: Right lateral lean Standing balance support: Bilateral upper extremity supported Standing balance-Leahy Scale: Poor Standing balance comment: pt with R lateral lean requiring 2 person A for safety.  Stood Tax inspector for balance and visual imput to attend to R side.    End of Session PT - End of Session Equipment Utilized During Treatment: Gait belt Activity Tolerance: Patient tolerated treatment well Patient left: in chair;with call bell/phone within reach;with chair alarm set;with family/visitor present Nurse Communication: Mobility status   GP     Catarina Hartshorn, [place] 12/07/2013, 12:12 PM

## 2013-12-07 NOTE — Consult Note (Signed)
Physical Medicine and Rehabilitation Consult Reason for Consult: Left lentiform nucleus hemorrhage Referring Physician: Dr. Leonie Man   HPI: Melissa [name] is a 63 y.o. right-handed female with history of hypertension. 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 CT and MRI imaging revealed acute hemorrhage centered in the left thalamus with extension into the left lateral ventricle no hydrocephalus. MRA of the head with no major occlusion or stenosis. Hemorrhage felt to be secondary to possible accelerated hypertension. TPA was not administered secondary to hemorrhage. Neurology services consulted with conservative care. Currently on a dysphagia 3 and liquid diet. Close monitoring of blood pressure. Physical and occupational therapy evaluations completed with recommendations for physical medicine rehabilitation consult.   Review of Systems  Unable to perform ROS: language   Past Medical History  Diagnosis Date  . Hypertension   . Uterine cancer    Past Surgical History  Procedure Laterality Date  . Replacement total knee bilateral    . Cholecystectomy    . Abdominal hysterectomy     History reviewed. No pertinent family history. Social History:  reports that she has never smoked. She does not have any smokeless tobacco history on file. She reports that she does not drink alcohol or use illicit drugs. Allergies: No Known Allergies Medications Prior to Admission  Medication Sig Dispense Refill  . diclofenac (VOLTAREN) 75 MG EC tablet Take 75 mg by mouth 2 (two) times daily.      . metoprolol (LOPRESSOR) 100 MG tablet Take 100 mg by mouth 2 (two) times daily.      Marland Kitchen olmesartan-hydrochlorothiazide (BENICAR HCT) 40-12.5 MG per tablet Take 1 tablet by mouth daily as needed.       Marland Kitchen amoxicillin (AMOXIL) 500 MG capsule Take 2,000 mg by mouth once.        Home: Home  Living Family/patient expects to be discharged to:: Private residence Living Arrangements: Spouse/significant other Available Help at Discharge: Family;Available 24 hours/day Type of Home: House Home Access: Stairs to enter [employer] of Steps: 2 Entrance Stairs-Rails: None Home Layout: One level Home Equipment: None Additional Comments: pt has walk in shower; has had previous bil TKAs; husband does not believe they have any equipment left from it at this time   Lives With: Spouse  Functional History: Prior Function Vocation: Full time employment Investment banker, 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: Pt works full time as a Pharmacologist Status:  Mobility:     Ambulation/Gait Ambulation Distance (Feet): 12 Feet Gait velocity: tries to impulsively increase speed and is unsafe  General Gait Details: pt very impulsive and demo decreased safety awareness with ambulating; pt heavily leaning to Rt and unaware during ambulating; cues for safety throughout ambulating; pt fatigues quickly     ADL: ADL Eating/Feeding: Performed;Minimal assistance Where Assessed - Eating/Feeding: Chair Grooming: Performed;Minimal assistance Where Assessed - Grooming: Supported sitting Upper Body Bathing: Simulated;Minimal assistance Where Assessed - Upper Body Bathing: Supported sitting Lower Body Bathing: Performed;Maximal assistance Where Assessed - Lower Body Bathing: Supported sit to stand Upper Body Dressing: Simulated;Minimal assistance Where Assessed - Upper Body Dressing: Supported sitting Lower Body Dressing: Performed;Maximal assistance Where Assessed - Lower Body Dressing: Supported sit to Lobbyist: Performed;Maximal Print production planner Method: Arts development officer: Bedside commode Transfers/Ambulation Related to ADLs: Pt with R lean when standing and very ataxic with RLE therefore requiring +2 for safety and assist  guiding and moving RLE. ADL Comments:  Pt using LUE for most adls.  Talked to husband about encouraging pt to use RUE as much as she can for adls if it is safe for her to do so.  Pt with decreased insight to severity of what is going on.  Feel pt is excellent rehab candidate.  Cognition: Cognition Overall Cognitive Status: Impaired/Different from baseline Arousal/Alertness: Lethargic Orientation Level: Oriented to person;Disoriented to time;Disoriented to place;Disoriented to situation Attention: Sustained Sustained Attention: Impaired Sustained Attention Impairment: Verbal basic;Functional basic Memory: Impaired Memory Impairment: Prospective memory;Decreased short term memory Awareness: Impaired Awareness Impairment: Emergent impairment;Anticipatory impairment Problem Solving: Impaired Problem Solving Impairment: Verbal basic Safety/Judgment:  (TBA) Cognition Arousal/Alertness: Awake/alert Behavior During Therapy: Impulsive Overall Cognitive Status: Impaired/Different from baseline Area of Impairment: Orientation;Following commands;Safety/judgement;Problem solving Orientation Level: Disoriented to;Situation;Place Following Commands: Follows one step commands inconsistently;Follows one step commands with increased time Safety/Judgement: Decreased awareness of deficits;Decreased awareness of safety Problem Solving: Slow processing;Decreased initiation;Difficulty sequencing;Requires verbal cues;Requires tactile cues General Comments: pt inconsistent with yes/no quesitons.   Difficult to assess due to: Impaired communication  Blood pressure 164/68, pulse 73, temperature 98.1 F (36.7 C), temperature source Oral, resp. rate 18, height 5\' 10"  (1.778 m), weight 132.7 kg (292 lb 8.8 oz), SpO2 96.00%. Physical Exam  Vitals reviewed. Constitutional:  63 year old obese female  HENT:  Head: Normocephalic.  Eyes: EOM are normal.  Neck: Normal range of motion. Neck supple. No thyromegaly present.  Cardiovascular: Normal rate  and regular rhythm.   Respiratory: Effort normal and breath sounds normal. No respiratory distress.  GI: Soft. Bowel sounds are normal. She exhibits no distension.  Neurological:  Patient is lethargic but arousable. She will make eye contact with examiner. She would state her name. She needed ongoing cues for a, place as well to provide name of her husband . She did follow simple verbal one-step commands. Difficulty with processing and completing thoughts. Some apraxia and motor planning issues noted. Strength appears to be 4/5 UE and 3- HF , 3+ knees, 4/5 at ankles. Little eye contact. Could answer some biographical questions    No results found for this or any previous visit (from the past 24 hour(s)). Dg Chest 2 View  12/06/2013   CLINICAL DATA:  Followup evaluation for congestive heart failure.  EXAM: CHEST  2 VIEW  COMPARISON:  Chest x-ray 12/02/2013.  FINDINGS: Lung volumes are low. Mild pulmonary venous congestion, without frank pulmonary edema. Heart size appears borderline enlarged, improved compared to prior study. Upper mediastinal contours are within normal limits. No pleural effusions.  IMPRESSION: 1. Resolution of previously noted mild congestive heart failure. Today's study demonstrates only borderline cardiomegaly with mild pulmonary venous congestion, but no frank pulmonary edema.   Electronically Signed   By: Vinnie Langton M.D.   On: 12/06/2013 11:04    Assessment/Plan: Diagnosis: left thalamic hemorrhage 1. Does the need for close, 24 hr/day medical supervision in concert with the patient's rehab needs make it unreasonable for this patient to be served in a less intensive setting? Yes 2. Co-Morbidities requiring supervision/potential complications: htn, morbid obesity, OA 3. Due to bladder management, bowel management, safety, skin/wound care, disease management, medication administration, pain management and patient education, does the patient require 24 hr/day rehab nursing?  Yes 4. Does the patient require coordinated care of a physician, rehab nurse, PT (1-2 hrs/day, 5 days/week), OT (1-2 hrs/day, 5 days/week) and SLP (1-2 hrs/day, 5 days/week) to address physical and functional deficits in the context of the  above medical diagnosis(es)? Yes Addressing deficits in the following areas: balance, endurance, locomotion, strength, transferring, bowel/bladder control, bathing, dressing, feeding, grooming, toileting, cognition, language, swallowing and psychosocial support 5. Can the patient actively participate in an intensive therapy program of at least 3 hrs of therapy per day at least 5 days per week? Yes 6. The potential for patient to make measurable gains while on inpatient rehab is excellent 7. Anticipated functional outcomes upon discharge from inpatient rehab are supervision with PT, supervision with OT, supervision to min assist with SLP. 8. Estimated rehab length of stay to reach the above functional goals is: 8-12 days 9. Does the patient have adequate social supports to accommodate these discharge functional goals? Yes 10. Anticipated D/C setting: Home 11. Anticipated post D/C treatments: [facility] therapy 12. Overall Rehab/Functional Prognosis: excellent  RECOMMENDATIONS: This patient's condition is appropriate for continued rehabilitative care in the following setting: CIR Patient has agreed to participate in recommended program. Yes Note that insurance prior authorization may be required for reimbursement for recommended care.  Comment: Rehab Admissions Coordinator to follow up.  Thanks,  Meredith Staggers, MD, Mellody Drown     12/07/2013

## 2013-12-07 NOTE — Progress Notes (Signed)
UR complete.  Lynx Goodrich RN, MSN 

## 2013-12-07 NOTE — Progress Notes (Signed)
Occupational Therapy Treatment Patient Details Name: [name] MRN: [id number] DOB: [birth year] Today's Date: 12/07/2013 Time: 7829-5621 OT Time Calculation (min): 20 min  OT Assessment / Plan / Recommendation  History of present illness Melissa [name] is a 63 y.o. female who was in her normal state of health earlier today 12/02/2013. She had a dentist appt at 2:30 pm and then left to go home. She stopped by a [employer] to use the restorrom, and after using it, sat down and became confused. EMS was called and she was taken to [place] where a CT head showed a left thalamic hemorrhage with IVH.    OT comments  Pt progressing, but limited by fatigue during session. Continue to recommend CIR for additional rehab prior to d/c home.  Follow Up Recommendations  CIR;Supervision/Assistance - 24 hour    Barriers to Discharge       Equipment Recommendations  3 in 1 bedside comode;Tub/shower bench    Recommendations for Other Services Rehab consult  Frequency Min 3X/week   Progress towards OT Goals Progress towards OT goals: Progressing toward goals  Plan Discharge plan remains appropriate    Precautions / Restrictions Precautions Precautions: Fall Precaution Comments: difficulty motor planning Rt LE and UE; expressive aphasia  Restrictions Weight Bearing Restrictions: No   Pertinent Vitals/Pain No pain reported.     ADL  Grooming: Teeth care;Minimal assistance Where Assessed - Grooming: Unsupported sitting Upper Body Dressing: Minimal assistance Where Assessed - Upper Body Dressing: Supported sitting Lower Body Dressing: Moderate assistance Where Assessed - Lower Body Dressing: Unsupported sit to stand Toilet Transfer: +2 Total assistance;Minimal assistance (+2 for safety) Toilet Transfer Method: Sit to stand Toilet Transfer Equipment:  (from bed and chair) Toileting - Clothing Manipulation and Hygiene: Minimal assistance Where Assessed - Camera operator Manipulation and  Hygiene: Other (comment) (sit to stand from chair) Equipment Used: Gait belt;Rolling walker Transfers/Ambulation Related to ADLs: +2 Total A/Min-Mod A for ambulation.  ADL Comments: Encouraged pt to be using RUE and spouse states she has been. Practiced UB dressing as well as donning underwear. Educated on dressing technique. Pt brushed teeth sitting EOB.     OT Diagnosis:    OT Problem List:   OT Treatment Interventions:     OT Goals(current goals can now be found in the care plan section) Acute Rehab OT Goals Patient Stated Goal: none stated OT Goal Formulation: With patient/family Time For Goal Achievement: 12/18/13 Potential to Achieve Goals: Good ADL Goals Pt Will Perform Grooming: with min guard assist;standing (using RUE as much as possible) Pt Will Perform Upper Body Bathing: with supervision;sitting Pt Will Perform Lower Body Bathing: with mod assist;sit to/from stand (assist only when standing) Pt Will Perform Upper Body Dressing: with set-up;sitting Pt Will Perform Lower Body Dressing: with min assist;sit to/from stand;with caregiver independent in assisting Pt Will Transfer to Toilet: with min assist;ambulating;bedside commode;regular height toilet Pt Will Perform Toileting - Clothing Manipulation and hygiene: with min assist;sit to/from stand Additional ADL Goal #1: Pt will feed self 50% of meal using RUE with S. Additional ADL Goal #2: Pt will attempt to use RUE during all basic adls tasks with min cues.  Visit Information  Last OT Received On: 12/07/13 Assistance Needed: +2 History of Present Illness: Melissa [name] is a 63 y.o. female who was in her normal state of health earlier today 12/02/2013. She had a dentist appt at 2:30 pm and then left to go home. She stopped by a [employer] to use the  restorrom, and after using it, sat down and became confused. EMS was called and she was taken to [place] where a CT head showed a left thalamic hemorrhage with IVH.      Subjective Data      Prior Functioning       Cognition  Cognition Arousal/Alertness: Awake/alert Behavior During Therapy: WFL for tasks assessed/performed Overall Cognitive Status: Impaired/Different from baseline Area of Impairment: Orientation;Following commands;Safety/judgement;Problem solving Orientation Level: Disoriented to;Place;Situation Following Commands: Follows one step commands inconsistently Safety/Judgement: Decreased awareness of safety Problem Solving: Slow processing Difficult to assess due to: Impaired communication    Mobility  Bed Mobility Overal bed mobility: Needs Assistance Bed Mobility: Supine to Sit;Sit to Supine Supine to sit: Mod assist Sit to supine: Min guard General bed mobility comments: Assistance with trunk. Cues for technique. Transfers Overall transfer level: Needs assistance Equipment used: Rolling walker (2 wheeled) Transfers: Sit to/from Stand Sit to Stand: +2 safety/equipment;Min assist General transfer comment: Cues for hand placement. Assist to control descent.    Exercises         End of Session OT - End of Session Equipment Utilized During Treatment: Gait belt;Rolling walker Activity Tolerance: Patient limited by fatigue Patient left: in bed;with call bell/phone within reach;with bed alarm set;with family/visitor present  GO     Benito Mccreedy OTR/L [phone] 12/07/2013, 3:10 PM

## 2013-12-07 NOTE — Progress Notes (Signed)
Pt's BP uncontrolled by PRN medication. Paged on call physician and order given. Will continue to monitor.

## 2013-12-08 ENCOUNTER — Inpatient Hospital Stay (HOSPITAL_COMMUNITY)
Admission: RE | Admit: 2013-12-08 | Discharge: 2013-12-24 | DRG: 945 | Disposition: A | Discharge status: Home / Self Care | Payer: 59 | Source: Intra-hospital | Attending: Physical Medicine & Rehabilitation | Admitting: Physical Medicine & Rehabilitation

## 2013-12-08 ENCOUNTER — Encounter (HOSPITAL_COMMUNITY): Payer: Self-pay | Admitting: *Deleted

## 2013-12-08 DIAGNOSIS — I10 Essential (primary) hypertension: Secondary | ICD-10-CM

## 2013-12-08 DIAGNOSIS — R1310 Dysphagia, unspecified: Secondary | ICD-10-CM

## 2013-12-08 DIAGNOSIS — Z5189 Encounter for other specified aftercare: Principal | ICD-10-CM

## 2013-12-08 DIAGNOSIS — I619 Nontraumatic intracerebral hemorrhage, unspecified: Secondary | ICD-10-CM | POA: Diagnosis present

## 2013-12-08 DIAGNOSIS — R4701 Aphasia: Secondary | ICD-10-CM | POA: Diagnosis present

## 2013-12-08 DIAGNOSIS — K5900 Constipation, unspecified: Secondary | ICD-10-CM | POA: Diagnosis not present

## 2013-12-08 DIAGNOSIS — Z8542 Personal history of malignant neoplasm of other parts of uterus: Secondary | ICD-10-CM

## 2013-12-08 DIAGNOSIS — Z79899 Other long term (current) drug therapy: Secondary | ICD-10-CM

## 2013-12-08 DIAGNOSIS — I959 Hypotension, unspecified: Secondary | ICD-10-CM | POA: Diagnosis not present

## 2013-12-08 MED ORDER — ONDANSETRON HCL 4 MG PO TABS
4.0000 mg | ORAL_TABLET | Freq: Four times a day (QID) | ORAL | Status: DC | PRN
  Filled 2013-12-08: qty 1

## 2013-12-08 MED ORDER — SORBITOL 70 % SOLN: 30.0000 mL | Freq: Every day | Status: DC | PRN

## 2013-12-08 MED ORDER — ACETAMINOPHEN 325 MG PO TABS
650.0000 mg | ORAL_TABLET | ORAL | Status: DC | PRN
  Administered 2013-12-18: 650 mg via ORAL
  Filled 2013-12-08: qty 2

## 2013-12-08 MED ORDER — AMLODIPINE BESYLATE 5 MG PO TABS
5.0000 mg | ORAL_TABLET | Freq: Every day | ORAL | Status: DC
  Administered 2013-12-09 – 2013-12-10 (×2): 5 mg via ORAL
  Filled 2013-12-08 (×4): qty 1

## 2013-12-08 MED ORDER — ACETAMINOPHEN 650 MG RE SUPP: 650.0000 mg | RECTAL | Status: DC | PRN

## 2013-12-08 MED ORDER — HYDROCHLOROTHIAZIDE 12.5 MG PO CAPS
12.5000 mg | ORAL_CAPSULE | Freq: Every day | ORAL | Status: DC
  Administered 2013-12-09 – 2013-12-24 (×16): 12.5 mg via ORAL
  Filled 2013-12-08 (×17): qty 1

## 2013-12-08 MED ORDER — METOPROLOL TARTRATE 100 MG PO TABS
100.0000 mg | ORAL_TABLET | Freq: Two times a day (BID) | ORAL | Status: DC
  Administered 2013-12-08 – 2013-12-24 (×32): 100 mg via ORAL
  Filled 2013-12-08 (×35): qty 1

## 2013-12-08 MED ORDER — BIOTENE DRY MOUTH MT LIQD
15.0000 mL | Freq: Two times a day (BID) | OROMUCOSAL | Status: DC
  Administered 2013-12-08 – 2013-12-24 (×26): 15 mL via OROMUCOSAL

## 2013-12-08 MED ORDER — IRBESARTAN 300 MG PO TABS
300.0000 mg | ORAL_TABLET | Freq: Every day | ORAL | Status: DC
  Administered 2013-12-09 – 2013-12-24 (×16): 300 mg via ORAL
  Filled 2013-12-08 (×17): qty 1

## 2013-12-08 MED ORDER — ONDANSETRON HCL 4 MG/2ML IJ SOLN: 4.0000 mg | Freq: Four times a day (QID) | INTRAMUSCULAR | Status: DC | PRN

## 2013-12-08 MED ORDER — SENNOSIDES-DOCUSATE SODIUM 8.6-50 MG PO TABS
1.0000 | ORAL_TABLET | Freq: Two times a day (BID) | ORAL | Status: DC
  Administered 2013-12-09 – 2013-12-20 (×7): 1 via ORAL
  Filled 2013-12-08 (×30): qty 1

## 2013-12-08 NOTE — Discharge Summary (Signed)
Stroke Discharge Summary  Patient ID: Melissa [name]   MRN: [id number]      DOB: Feb 08, 1951  Date of Admission: 12/02/2013 Date of Discharge: 12/08/2013  Attending Physician:  Suzzanne Cloud, MD, Stroke MD  Consulting Physician(s):  Treatment Team:  Md Stroke, MD, Alger Simons, MD (Physical Medicine & Rehabtilitation) Patient's PCP:  Chancy Hurter, MD  Discharge Diagnoses:    Cerebral hemorrhage, acute - left lentiform nucleus with intraventricular extension and cytotoxic cerebral edema secondary to accelerated hypertension   Accelerated hypertension    Proteinuria   CHF, resolved   Family hx stroke (grandfather)    Morbid Obesity, BMI  Body mass index is 41.98 kg/(m^2).   Past Medical History  Diagnosis Date  . Hypertension   . Uterine cancer    Past Surgical History  Procedure Laterality Date  . Replacement total knee bilateral    . Cholecystectomy    . Abdominal hysterectomy      Medications to be continued on Rehab . amLODipine  5 mg Oral Daily  . antiseptic oral rinse  15 mL Mouth Rinse BID  . irbesartan  300 mg Oral Daily   And  . hydrochlorothiazide  12.5 mg Oral Daily  . metoprolol  100 mg Oral BID  . senna-docusate  1 tablet Oral BID    LABORATORY STUDIES CBC    Component Value Date/Time   WBC 7.0 12/02/2013 1637   RBC 4.36 12/02/2013 1637   HGB 13.7 12/02/2013 1637   HCT 40.3 12/02/2013 1637   PLT 149* 12/02/2013 1637   MCV 92.4 12/02/2013 1637   MCH 31.4 12/02/2013 1637   MCHC 34.0 12/02/2013 1637   RDW 12.7 12/02/2013 1637   LYMPHSABS 1.1 12/02/2013 1637   MONOABS 0.6 12/02/2013 1637   EOSABS 0.2 12/02/2013 1637   BASOSABS 0.0 12/02/2013 1637   CMP    Component Value Date/Time   NA 143 12/02/2013 2033   K 4.0 12/02/2013 2033   CL 100 12/02/2013 2033   CO2 30 12/02/2013 2033   GLUCOSE 117* 12/02/2013 2033   BUN 19 12/02/2013 2033   CREATININE 1.08 12/02/2013 2033   CALCIUM 9.4 12/02/2013 2033   PROT 7.2 12/02/2013 1637   ALBUMIN 3.5 12/02/2013 1637   AST 20  12/02/2013 1637   ALT 21 12/02/2013 1637   ALKPHOS 105 12/02/2013 1637   BILITOT 0.8 12/02/2013 1637   GFRNONAA 53* 12/02/2013 2033   GFRAA 62* 12/02/2013 2033   COAGS Lab Results  Component Value Date   INR 1.01 12/02/2013   Lipid Panel No results found for this basename: chol, trig, hdl, cholhdl, vldl, ldlcalc   HgbA1C  Lab Results  Component Value Date   HGBA1C 6.9* 12/03/2013   Cardiac Panel (last 3 results) No results found for this basename: CKTOTAL, CKMB, TROPONINI, RELINDX,  in the last 72 hours Urinalysis    Component Value Date/Time   COLORURINE YELLOW 12/02/2013 1743   APPEARANCEUR CLOUDY* 12/02/2013 1743   LABSPEC >1.030* 12/02/2013 1743   PHURINE 5.5 12/02/2013 1743   GLUCOSEU NEGATIVE 12/02/2013 1743   HGBUR MODERATE* 12/02/2013 1743   BILIRUBINUR SMALL* 12/02/2013 1743   KETONESUR NEGATIVE 12/02/2013 1743   PROTEINUR >300* 12/02/2013 1743   UROBILINOGEN 0.2 12/02/2013 1743   NITRITE NEGATIVE 12/02/2013 1743   LEUKOCYTESUR NEGATIVE 12/02/2013 1743   Urine Drug Screen     Component Value Date/Time   LABOPIA NONE DETECTED 12/02/2013 1743   COCAINSCRNUR NONE DETECTED 12/02/2013 1743   LABBENZ NONE  DETECTED 12/02/2013 1743   AMPHETMU NONE DETECTED 12/02/2013 1743   THCU NONE DETECTED 12/02/2013 1743   LABBARB NONE DETECTED 12/02/2013 1743    Alcohol Level No results found for this basename: eth     SIGNIFICANT DIAGNOSTIC STUDIES CT of the brain  12/03/2013 1. No significant change in acute intracranial hemorrhage within the left lentiform nucleus.  2. Mild rightward midline shift is stable. 3. Small amount of intraventricular hemorrhage is stable.  12/02/2013 Study is positive for a left laminectomy scratch the study is positive for acute left limit hemorrhage with associated intraventricular blood. Negative for hydrocephalus or midline shift. Mild prominence of left temporal tip is noted.  MRI of the brain 12/03/2013 Unchanged size of acute hemorrhage centered in the left thalamus with extension into the  left lateral ventricle. No hydrocephalus.  MRA of the brain 12/03/2013 Moderate chronic small vessel ischemic disease. No evidence of major intracranial arterial occlusion or high-grade stenosis. Mild bilateral carotid siphon atherosclerosis.  CXR  12/06/2013. Resolution of previously noted mild congestive heart failure. study demonstrates only borderline cardiomegaly with mild  pulmonary venous congestion, but no frank pulmonary edema.  12/02/2013 The findings are consistent with low-grade CHF with mild pulmonary interstitial edema.  EKG sinus bradycardia. For complete results please see formal report.    History of Present Illness   Melissa [name] is a 63 y.o. female who was in her normal state of health on 12/02/2013. She had a dentist appt at 2:30 pm and then left to go home. She stopped by a [employer] to use the restroom, and after using it, sat down and became confused. EMS was called and she was taken to [facility] where a CT head showed a left thalamic hemorrhage with IVH. Of note, she has also noticed swelling in her legs for the past couple of days and her urine has proteinuria. On no antithrombotics prior to admission. Patient was not administerd TPA secondary to hemorrhage. She was admitted to the neuro ICU for further evaluation and treatment.   Hospital Course Imaging did confirm a left lentiform nucleus hemorrhage with intraventricular extension and cytotoxic cerebral edema. Hemorrhage felt to be secondary to accelerated hypertension, unable to confirm BPs obtained by EMS but they have been high and difficult to control in the hospital. She was initially on cardene. It was weaned and home meds restarted - Avapro, hydrochlorothiazide, and metoprolol. Amlodipine was added, then Increased to 5 mg daily. BP remains elevated, though is normalizing, receiving prn labetolol, goal < 180.   Patient with vascular risk factors of:   Accelerated Hypertension  Family hx stroke (grandfather)    Morbid obesity  Patient also with:  Proteinuria   CHF by admission chest x-ray, repeat CXR shows resolution of CHF.  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 Physical therapy, occupational therapy and speech therapy evaluated patient. All agreed inpatient rehab is needed. Patient's husband is/are supportive and can provide care at discharge. CIR bed is available today and patient will be transferred there.  Discharge Exam  Blood pressure 160/78, pulse 64, temperature 97.8 F (36.6 C), temperature source Oral, resp. rate 18, height 5\' 10"  (1.778 m), weight 132.7 kg (292 lb 8.8 oz), SpO2 95.00%. Pleasant middle aged obese caucasian lady not in distress.Awake alert. Afebrile. Head is nontraumatic. Neck is supple without bruit. Hearing is normal. Cardiac exam no murmur or gallop. Lungs are clear to auscultation. Distal pulses are well felt.  Neurological Exam : Awake alert oriented x3.  Nonfluent and hesitant speech which marked dysarthria and word finding difficulties. Good comprehension. Impaired naming and repetition.Able to read well. Extraocular moments are full range without nystagmus. Blinks to threat bilaterally. Fundi were not visualized. Vision seems adequate. There is mild right lower facial weakness. Tongue midline.Motor system exam no upper or lower extremity drift. Mild weakness of right grip and intrinsic hand muscles. Fine finger movements are diminished right. Orbits left over right upper extremity. Touch, pinprick sensation are preserved. Gait was not tested. Plantars are both downgoing.   Discharge Diet  Dysphagia 3 thin liquids liquids  Discharge Plan  Disposition:  Transfer to [place] for ongoing PT, OT and ST  Do not take aspirin, aspirin-containing medications, or ibuprofen products   Recommend ongoing risk factor control by Primary Care Physician at time of discharge from inpatient rehabilitation. Risk factor  recommendations:  Hypertension target range 130-140/70-80   Follow-up Chancy Hurter, MD in 1 month following discharge from rehab.  Follow-up with Dr. Antony Contras, Stroke Clinic in 2 months.  35 minutes were spent preparing discharge.  Signed Burnetta Sabin, MSN, RN, ANVP-BC, AGPCNP-BC St[facility] Center Nurse Practitioner 12/08/2013, 3:03 PM  I have personally examined this patient, reviewed pertinent data and developed the plan of care. I agree with above.  Antony Contras, MD

## 2013-12-08 NOTE — Progress Notes (Signed)
Came to visit patient on behalf of [facility] Care Management program for [facility] employees/dependents. She works at one of the [employer]. Spoke with patient and husband at bedside. Patient to transfer to inpatient rehab. Will continue to follow. Consents signed. Will follow up with patient post hospital discharge as well. Confirmed best contact numbers for husband and son and patient's home number. Left packet and contact information at bedside. Appreciative of visit.  Marthenia Rolling, MSN- [facility] [phone]

## 2013-12-08 NOTE — PMR Pre-admission (Signed)
PMR Admission Coordinator Pre-Admission Assessment  Patient: Melissa [name] is an 63 y.o., female MRN: [id number] DOB: [birth year] Height: _0  (177.8 cm) Weight: 132.7 kg (292 lb 8.8 oz)              Insurance Information HMO:      PPO:       PCP:       IPA:       80/20:       OTHER:  Group # [id number] PRIMARY: [facility]      Policy#: [id number]      Subscriber: Carylon Perches CM Name:  Anda Kraft      Phone#: [phone]     Fax#: [phone] Pre-Cert#: [id number]      Employer: Jacklynn Ganong MD office Benefits:  Phone #: [phone]     Name: Jari Favre. Date: 10/01/09     Deduct: $0      Out of Pocket Max: $4000($0 met)      Life Max: unlimited CIR: $500 copay and then 80%      SNF: 80% with 120 visit max Outpatient: 60 visit limit     Co-Pay: $20/visit Home Health: 80% w/auth      Co-Pay: 20% DME: 80%     Co-Pay: 20% Providers: in network   Emergency Contact Information Contact Information   Name Relation Home Work [place] [phone]     Current Medical History  Patient Admitting Diagnosis: Left thalamic hemorrhage   History of Present Illness: A 63 y.o. right-handed female with history of hypertension. 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 CT and MRI imaging revealed acute hemorrhage centered in the left thalamus with extension into the left lateral ventricle no hydrocephalus. MRA of the head with no major occlusion or stenosis. Hemorrhage felt to be secondary to possible accelerated hypertension. TPA was not administered secondary to hemorrhage. Neurology services consulted with conservative care. Currently on a dysphagia 3 and liquid diet. Close monitoring of blood pressure. Physical and occupational therapy evaluations completed with recommendations for physical medicine rehabilitation consult.     Total: 5=NIH  Past Medical History  Past Medical History  Diagnosis Date  . Hypertension   . Uterine cancer     Family History  family history is not on file.  Prior Rehab/Hospitalizations:  Had [facility] therapies after TKR 5-6 yrs ago with [place] and then outpatient therapy in [place].   Current Medications  Current facility-administered medications:acetaminophen (TYLENOL) suppository 650 mg, 650 mg, Rectal, Q4H PRN, Wallie Char;  acetaminophen (TYLENOL) tablet 650 mg, 650 mg, Oral, Q4H PRN, Wallie Char;  amLODipine (NORVASC) tablet 5 mg, 5 mg, Oral, Daily, David L Rinehuls, PA-C, 5 mg at 12/08/13 1058;  antiseptic oral rinse (BIOTENE) solution 15 mL, 15 mL, Mouth Rinse, BID, Garvin Fila, MD, 15 mL at 12/08/13 0900 hydrochlorothiazide (MICROZIDE) capsule 12.5 mg, 12.5 mg, Oral, Daily, Donzetta Starch, NP, 12.5 mg at 12/08/13 1058;  irbesartan (AVAPRO) tablet 300 mg, 300 mg, Oral, Daily, Donzetta Starch, NP, 300 mg at 12/08/13 1058;  labetalol (NORMODYNE,TRANDATE) injection 10-40 mg, 10-40 mg, Intravenous, Q10 min PRN, Wallie Char, 20 mg at 12/08/13 0216;  metoprolol (LOPRESSOR) tablet 100 mg, 100 mg, Oral, BID, Donzetta Starch, NP, 100 mg at 12/08/13 1058 senna-docusate (Senokot-S) tablet 1 tablet, 1 tablet, Oral, BID, Wallie Char, 1 tablet at 12/06/13 7680  Patients Current  Diet: Dysphagia  Precautions / Restrictions Precautions Precautions: Fall Precaution Comments: difficulty motor planning Rt LE and UE; expressive aphasia  Restrictions Weight Bearing Restrictions: No   Prior Activity Level Community (5-7x/wk): Worked FT.  Went out daily.  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  Home Assistive Devices / Equipment Home Assistive Devices/Equipment: None Home Equipment: None  Prior Functional Level Prior Function Level of Independence: Independent Comments: Pt works full time as a Counselling psychologist  Arousal/Alertness: Lethargic Overall  Cognitive Status: Impaired/Different from baseline Difficult to assess due to: Impaired communication Current Attention Level: Sustained Orientation Level: Oriented to person;Oriented to place;Oriented to time Following Commands: Follows one step commands inconsistently Safety/Judgement: Decreased awareness of safety;Decreased awareness of deficits General Comments: pt does best with step-by-step cueing and pt attemptsto answer questions, though answers are incorrect per husband.   Attention: Sustained Sustained Attention: Impaired Sustained Attention Impairment: Verbal basic;Functional basic Memory: Impaired Memory Impairment: Prospective memory;Decreased short term memory Awareness: Impaired Awareness Impairment: Emergent impairment;Anticipatory impairment Problem Solving: Impaired Problem Solving Impairment: Verbal basic Safety/Judgment:  (TBA)    Extremity Assessment (includes Sensation/Coordination)          ADLs   Grooming: Teeth care;Minimal assistance  Where Assessed - Grooming: Unsupported sitting  Upper Body Dressing: Minimal assistance  Where Assessed - Upper Body Dressing: Supported sitting  Lower Body Dressing: Moderate assistance  Where Assessed - Lower Body Dressing: Unsupported sit to stand  Toilet Transfer: +2 Total assistance;Minimal assistance (+2 for safety)  Toilet Transfer Method: Sit to stand  Toilet Transfer Equipment: (from bed and chair)  Toileting - Clothing Manipulation and Hygiene: Minimal assistance  Where Assessed - Camera operator Manipulation and Hygiene: Other (comment) (sit to stand from chair)  Equipment Used: Gait belt;Rolling walker  Transfers/Ambulation Related to ADLs: +2 Total A/Min-Mod A for ambulation.  ADL Comments: Encouraged pt to be using RUE and spouse states she has been. Practiced UB dressing as well as donning underwear. Educated on dressing technique. Pt brushed teeth sitting EOB.     Mobility  Overal bed mobility: Needs  Assistance Bed Mobility: Supine to Sit Supine to sit: Mod assist Sit to supine: Min guard General bed mobility comments: pt slightly impulsive and attempts to get OOB once PT mentions trying to amb.  cues to slow down and wait for A.      Transfers  Overall transfer level: Needs assistance Equipment used: Rolling walker (2 wheeled) Transfers: Sit to/from Stand Sit to Stand: Min assist;+2 physical assistance Stand pivot transfers: +2 physical assistance;Max assist General transfer comment: cues for UE use and controlling descent to sitting.  Facilitation for positioning LEs prior to standing.      Ambulation / Gait / Stairs / Wheelchair Mobility  Ambulation/Gait Ambulation/Gait assistance: Min assist;+2 physical assistance Ambulation Distance (Feet): 15 Feet (x2) Assistive device: Rolling walker (2 wheeled) Gait Pattern/deviations: Step-to pattern;Decreased step length - left;Decreased stance time - right;Shuffle;Narrow base of support;Staggering right Gait velocity: tries to impulsively increase speed and is unsafe  General Gait Details: Provided step-by-step cues for pt to sequence gait and to maintain slow speed as pt tends to rush causing decreased balance and R lateral LOB.      Posture / Balance Dynamic Sitting Balance Sitting balance - Comments: pt at S level; has slight lean to Rt; correctable with verbal cues     Special needs/care consideration BiPAP/CPAP No CPM No Continuous Drip IV No Dialysis No  Life Vest No Oxygen No Special Bed No Trach Size No Wound Vac (area) No      Skin No                               Bowel mgmt: Had BM today, 12/08/13 Bladder mgmt: Using bedpan, incontinent at times Diabetic mgmt No    Previous Home Environment Living Arrangements: Spouse/significant other  Lives With: Spouse Available Help at Discharge: Family;Available 24 hours/day Type of Home: House Home Layout: One level Home Access: Stairs to enter Entrance  Stairs-Rails: None Entrance Stairs-Number of Steps: 2 Bathroom Shower/Tub: Walk-in shower;Tub/shower unit Biochemist, clinical: Standard Home Care Services: No Additional Comments: pt has walk in shower; has had previous bil TKAs; husband does not believe they have any equipment left from it at this time   Discharge Living Setting Plans for Discharge Living Setting: Patient's home;House;Lives with (comment) (Lives with husband.) Type of Home at Discharge: House Discharge Home Layout: One level Discharge Home Access: Stairs to enter Entrance Stairs-Number of Steps: 2 Does the patient have any problems obtaining your medications?: No  Social/Family/Support Systems Patient Roles: Spouse;Parent (Has a son locally who works days.) Contact Information: Kensli Bowley - spouse Anticipated Caregiver: Husband Anticipated Caregiver's Contact Information: Joneen Caraway - husband [phone] Ability/Limitations of Caregiver: Husband has his own business.  Son works with husband.  Husband will get help as needed to provide supervision at home. Caregiver Availability: 24/7 Discharge Plan Discussed with Primary Caregiver: Yes Is Caregiver In Agreement with Plan?: Yes Does Caregiver/Family have Issues with Lodging/Transportation while Pt is in Rehab?: No  Goals/Additional Needs Patient/Family Goal for Rehab: PT/OT S, ST S/Min assist Expected length of stay: 8-12 days Cultural Considerations: [employer] Dietary Needs: Dys 3, thin liquids Equipment Needs: TBD Pt/Family Agrees to Admission and willing to participate: Yes Program Orientation Provided & Reviewed with Pt/Caregiver Including Roles  & Responsibilities: Yes  Decrease burden of Care through IP rehab admission: N/A  Possible need for SNF placement upon discharge: Not planned  Patient Condition: This patient's condition remains as documented in the consult dated 12/07/13, in which the Rehabilitation Physician determined and documented that the  patient's condition is appropriate for intensive rehabilitative care in an inpatient rehabilitation facility. Will admit to inpatient rehab today.  Preadmission Screen Completed By:  Retta Diones, 12/08/2013 1:43 PM ______________________________________________________________________   Discussed status with Dr. Naaman Plummer on 12/08/13 at 1358 and received telephone approval for admission today.  Admission Coordinator:  Retta Diones, time1358/Date03/10/15

## 2013-12-08 NOTE — Progress Notes (Signed)
Pt transferred to [place] room 4006 along with all pt belongings  Report called to [facility] .condition  At discharge was stable. Pt spouse with  her during transfer.

## 2013-12-08 NOTE — H&P (View-Only) (Signed)
  Physical Medicine and Rehabilitation Admission H&P    Chief Complaint  Patient presents with  . Near Syncope  . Altered Mental Status    Chief complaint: Headache   HPI: Melissa [name] is a 62 y.o. right-handed female with history of hypertension. 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 CT and MRI imaging revealed acute hemorrhage centered in the left thalamus with extension into the left lateral ventricle no hydrocephalus. MRA of the head with no major occlusion or stenosis. Hemorrhage felt to be secondary to possible accelerated hypertension. TPA was not administered secondary to hemorrhage. Neurology services consulted with conservative care. Currently on a dysphagia 3 and liquid diet. Close monitoring of blood pressure. Physical and occupational therapy evaluations completed with recommendations for physical medicine rehabilitation consult   ROS Review of Systems  Unable to perform ROS: language   Past Medical History  Diagnosis Date  . Hypertension   . Uterine cancer    Past Surgical History  Procedure Laterality Date  . Replacement total knee bilateral    . Cholecystectomy    . Abdominal hysterectomy     History reviewed. No pertinent family history. Social History:  reports that she has never smoked. She does not have any smokeless tobacco history on file. She reports that she does not drink alcohol or use illicit drugs. Allergies: No Known Allergies Medications Prior to Admission  Medication Sig Dispense Refill  . diclofenac (VOLTAREN) 75 MG EC tablet Take 75 mg by mouth 2 (two) times daily.      . metoprolol (LOPRESSOR) 100 MG tablet Take 100 mg by mouth 2 (two) times daily.      . olmesartan-hydrochlorothiazide (BENICAR HCT) 40-12.5 MG per tablet Take 1 tablet by mouth daily as needed.       . amoxicillin (AMOXIL) 500 MG capsule Take 2,000 mg by mouth  once.        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  cues for movement of R LE, however pt not following all cueing for R LE.      ADL: ADL Eating/Feeding: Performed;Minimal assistance Where Assessed - Eating/Feeding: Chair Grooming: Teeth care;Minimal assistance Where Assessed - Grooming: Unsupported sitting Upper Body Bathing: Simulated;Minimal assistance Where Assessed - Upper Body Bathing: Supported sitting Lower Body Bathing: Performed;Maximal assistance Where Assessed - Lower Body Bathing: Supported sit to stand Upper Body Dressing: Minimal assistance Where Assessed - Upper Body Dressing: Supported sitting Lower Body Dressing: Moderate assistance Where Assessed - Lower Body Dressing: Unsupported sit to stand Toilet Transfer: +2 Total assistance;Minimal assistance (+2 for safety) Toilet Transfer Method: Sit   to Loss adjuster, chartered:  (from bed and chair) Equipment Used: Gait belt;Rolling walker Transfers/Ambulation Related to ADLs: +2 Total A/Min-Mod A for ambulation.  ADL Comments: Encouraged pt to be  using RUE and spouse states she has been. Practiced UB dressing as well as donning underwear. Educated on dressing technique. Pt brushed teeth sitting EOB.   Cognition: Cognition Overall Cognitive Status: Impaired/Different from baseline Arousal/Alertness: Lethargic Orientation Level: Oriented to person;Disoriented to time;Disoriented to place;Disoriented to situation Attention: Sustained Sustained Attention: Impaired Sustained Attention Impairment: Verbal basic;Functional basic Memory: Impaired Memory Impairment: Prospective memory;Decreased short term memory Awareness: Impaired Awareness Impairment: Emergent impairment;Anticipatory impairment Problem Solving: Impaired Problem Solving Impairment: Verbal basic Safety/Judgment:  (TBA) Cognition Arousal/Alertness: Awake/alert Behavior During Therapy: WFL for tasks assessed/performed Overall Cognitive Status: Impaired/Different from baseline Area of Impairment: Orientation;Following commands;Safety/judgement;Problem solving Orientation Level: Disoriented to;Place;Situation Following Commands: Follows one step commands inconsistently Safety/Judgement: Decreased awareness of safety Problem Solving: Slow processing General Comments: pt inconsistent with yes/no quesitons.   Difficult to assess due to: Impaired communication  Physical Exam: Blood pressure 178/72, pulse 65, temperature 98.5 F (36.9 C), temperature source Oral, resp. rate 20, height 5\' 10"  (1.778 m), weight 132.7 kg (292 lb 8.8 oz), SpO2 95.00%. Physical Exam Constitutional:  63 year old obese female, NAD. Lies flat in bed looking straight ahead HENT: oral mucosa pink and moist Head: Normocephalic.  Eyes: EOM are normal.  Neck: Normal range of motion. Neck supple. No thyromegaly present.  Cardiovascular: Normal rate and regular rhythm. No murmurs, rubs, or gallops Respiratory: Effort normal and breath sounds normal. No respiratory distress. No wheezes, rales, or  rhonchi GI: Soft. Bowel sounds are normal. She exhibits no distension.  Neurological:  Patient is arousable and fairly alert. She needed ongoing cues for a, place as well to provide name of her husband .  She could not tell me the date. Able to recall month given choices. She did follow simple verbal one-step commands. Difficulty with processing and completing thoughts. Some apraxia and motor planning issues noted. Echolalic and often perseverative on certain tasks.  Strength appears to be 4/5 UE proximal to distally.  LE 3- HF , 3+ KE, 4/5 at ankles.    No results found for this or any previous visit (from the past 48 hour(s)). Dg Chest 2 View  12/06/2013   CLINICAL DATA:  Followup evaluation for congestive heart failure.  EXAM: CHEST  2 VIEW  COMPARISON:  Chest x-ray 12/02/2013.  FINDINGS: Lung volumes are low. Mild pulmonary venous congestion, without frank pulmonary edema. Heart size appears borderline enlarged, improved compared to prior study. Upper mediastinal contours are within normal limits. No pleural effusions.  IMPRESSION: 1. Resolution of previously noted mild congestive heart failure. Today's study demonstrates only borderline cardiomegaly with mild pulmonary venous congestion, but no frank pulmonary edema.   Electronically Signed   By: Vinnie Langton M.D.   On: 12/06/2013 11:04    Post Admission Physician Evaluation: 1. Functional deficits secondary  to left thalamic hemorrhage with extension into the lat ventricle. 2. Patient is admitted to receive collaborative, interdisciplinary care between the physiatrist, rehab nursing staff, and therapy team. 3. Patient's level of medical complexity and substantial therapy needs in context of that medical necessity cannot be provided at a lesser intensity of care such as a SNF. 4. Patient has experienced substantial functional loss from his/her baseline which was documented above under the "Functional History" and "Functional Status" headings.   Judging by the patient's diagnosis, physical exam, and functional history, the  patient has potential for functional progress which will result in measurable gains while on inpatient rehab.  These gains will be of substantial and practical use upon discharge  in facilitating mobility and self-care at the household level. 5. Physiatrist will provide 24 hour management of medical needs as well as oversight of the therapy plan/treatment and provide guidance as appropriate regarding the interaction of the two. 6. 24 hour rehab nursing will assist with bladder management, bowel management, safety, skin/wound care, disease management, medication administration, pain management and patient education  and help integrate therapy concepts, techniques,education, etc. 7. PT will assess and treat for/with: Lower extremity strength, range of motion, stamina, balance, functional mobility, safety, adaptive techniques and equipment, processing, cognitive perceptual rx, apraxia mgt, family education, NMR.   Goals are: supervision to mod I . 8. OT will assess and treat for/with: ADL's, functional mobility, safety, upper extremity strength, adaptive techniques and equipment, NMR, processing, apraxia, cognitive perceptual awareness.   Goals are: supervision to mod I. 9. SLP will assess and treat for/with: cognition, processing, communication, education for family.  Goals are: supervision. 10. Case Management and Social Worker will assess and treat for psychological issues and discharge planning. 11. Team conference will be held weekly to assess progress toward goals and to determine barriers to discharge. 12. Patient will receive at least 3 hours of therapy per day at least 5 days per week. 13. ELOS: 12-17 days       14. Prognosis:  excellent   Medical Problem List and Plan: 1. 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 DVT Prophylaxis/Anticoagulation: SCDs. Monitor for any signs of DVT  3. Pain  Management: Tylenol as needed. Monitor with increased mobility 4. Neuropsych: This patient is not yet capable of making decisions on her own behalf. 5. Hypertension.   -remains poorly controlled  -increase Norvasc to 5mg  BID  -continue Avapro 300 mg daily, hydrochlorothiazide 12.5 mg daily, Lopressor 100 mg twice a day. Monitor with increased mobility   Meredith Staggers, MD, [facility]   12/08/2013

## 2013-12-08 NOTE — Progress Notes (Signed)
Rehab admissions - Evaluated for possible admission.  I met with patient and her husband.  They would like inpatient rehab and then home with husband.  I will contact [employer] insurance carrier and hope to admit to inpatient rehab today.  Call me for questions.  #[phone]

## 2013-12-08 NOTE — H&P (Signed)
Physical Medicine and Rehabilitation Admission H&P    Chief Complaint  Patient presents with  . Near Syncope  . Altered Mental Status    Chief complaint: Headache   HPI: Melissa [name] is a 63 y.o. right-handed female with history of hypertension. 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 CT and MRI imaging revealed acute hemorrhage centered in the left thalamus with extension into the left lateral ventricle no hydrocephalus. MRA of the head with no major occlusion or stenosis. Hemorrhage felt to be secondary to possible accelerated hypertension. TPA was not administered secondary to hemorrhage. Neurology services consulted with conservative care. Currently on a dysphagia 3 and liquid diet. Close monitoring of blood pressure. Physical and occupational therapy evaluations completed with recommendations for physical medicine rehabilitation consult   ROS Review of Systems  Unable to perform ROS: language   Past Medical History  Diagnosis Date  . Hypertension   . Uterine cancer    Past Surgical History  Procedure Laterality Date  . Replacement total knee bilateral    . Cholecystectomy    . Abdominal hysterectomy     History reviewed. No pertinent family history. Social History:  reports that she has never smoked. She does not have any smokeless tobacco history on file. She reports that she does not drink alcohol or use illicit drugs. Allergies: No Known Allergies Medications Prior to Admission  Medication Sig Dispense Refill  . diclofenac (VOLTAREN) 75 MG EC tablet Take 75 mg by mouth 2 (two) times daily.      . metoprolol (LOPRESSOR) 100 MG tablet Take 100 mg by mouth 2 (two) times daily.      Marland Kitchen olmesartan-hydrochlorothiazide (BENICAR HCT) 40-12.5 MG per tablet Take 1 tablet by mouth daily as needed.       Marland Kitchen amoxicillin (AMOXIL) 500 MG capsule Take 2,000 mg by mouth  once.        Home: Home Living Family/patient expects to be discharged to:: Private residence Living Arrangements: Spouse/significant other Available Help at Discharge: Family;Available 24 hours/day Type of Home: House Home Access: Stairs to enter [employer] of Steps: 2 Entrance Stairs-Rails: None Home Layout: One level Home Equipment: None Additional Comments: pt has walk in shower; has had previous bil TKAs; husband does not believe they have any equipment left from it at this time   Lives With: Spouse   Functional History: Prior Function Vocation: Full time employment Investment banker, 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  cues for movement of R LE, however pt not following all cueing for R LE.      ADL: ADL Eating/Feeding: Performed;Minimal assistance Where Assessed - Eating/Feeding: Chair Grooming: Teeth care;Minimal assistance Where Assessed - Grooming: Unsupported sitting Upper Body Bathing: Simulated;Minimal assistance Where Assessed - Upper Body Bathing: Supported sitting Lower Body Bathing: Performed;Maximal assistance Where Assessed - Lower Body Bathing: Supported sit to stand Upper Body Dressing: Minimal assistance Where Assessed - Upper Body Dressing: Supported sitting Lower Body Dressing: Moderate assistance Where Assessed - Lower Body Dressing: Unsupported sit to stand Toilet Transfer: +2 Total assistance;Minimal assistance (+2 for safety) Toilet Transfer Method: Sit  to Loss adjuster, chartered:  (from bed and chair) Equipment Used: Gait belt;Rolling walker Transfers/Ambulation Related to ADLs: +2 Total A/Min-Mod A for ambulation.  ADL Comments: Encouraged pt to be  using RUE and spouse states she has been. Practiced UB dressing as well as donning underwear. Educated on dressing technique. Pt brushed teeth sitting EOB.   Cognition: Cognition Overall Cognitive Status: Impaired/Different from baseline Arousal/Alertness: Lethargic Orientation Level: Oriented to person;Disoriented to time;Disoriented to place;Disoriented to situation Attention: Sustained Sustained Attention: Impaired Sustained Attention Impairment: Verbal basic;Functional basic Memory: Impaired Memory Impairment: Prospective memory;Decreased short term memory Awareness: Impaired Awareness Impairment: Emergent impairment;Anticipatory impairment Problem Solving: Impaired Problem Solving Impairment: Verbal basic Safety/Judgment:  (TBA) Cognition Arousal/Alertness: Awake/alert Behavior During Therapy: WFL for tasks assessed/performed Overall Cognitive Status: Impaired/Different from baseline Area of Impairment: Orientation;Following commands;Safety/judgement;Problem solving Orientation Level: Disoriented to;Place;Situation Following Commands: Follows one step commands inconsistently Safety/Judgement: Decreased awareness of safety Problem Solving: Slow processing General Comments: pt inconsistent with yes/no quesitons.   Difficult to assess due to: Impaired communication  Physical Exam: Blood pressure 178/72, pulse 65, temperature 98.5 F (36.9 C), temperature source Oral, resp. rate 20, height 5\' 10"  (1.778 m), weight 132.7 kg (292 lb 8.8 oz), SpO2 95.00%. Physical Exam Constitutional:  63 year old obese female, NAD. Lies flat in bed looking straight ahead HENT: oral mucosa pink and moist Head: Normocephalic.  Eyes: EOM are normal.  Neck: Normal range of motion. Neck supple. No thyromegaly present.  Cardiovascular: Normal rate and regular rhythm. No murmurs, rubs, or gallops Respiratory: Effort normal and breath sounds normal. No respiratory distress. No wheezes, rales, or  rhonchi GI: Soft. Bowel sounds are normal. She exhibits no distension.  Neurological:  Patient is arousable and fairly alert. She needed ongoing cues for a, place as well to provide name of her husband .  She could not tell me the date. Able to recall month given choices. She did follow simple verbal one-step commands. Difficulty with processing and completing thoughts. Some apraxia and motor planning issues noted. Echolalic and often perseverative on certain tasks.  Strength appears to be 4/5 UE proximal to distally.  LE 3- HF , 3+ KE, 4/5 at ankles.    No results found for this or any previous visit (from the past 48 hour(s)). Dg Chest 2 View  12/06/2013   CLINICAL DATA:  Followup evaluation for congestive heart failure.  EXAM: CHEST  2 VIEW  COMPARISON:  Chest x-ray 12/02/2013.  FINDINGS: Lung volumes are low. Mild pulmonary venous congestion, without frank pulmonary edema. Heart size appears borderline enlarged, improved compared to prior study. Upper mediastinal contours are within normal limits. No pleural effusions.  IMPRESSION: 1. Resolution of previously noted mild congestive heart failure. Today's study demonstrates only borderline cardiomegaly with mild pulmonary venous congestion, but no frank pulmonary edema.   Electronically Signed   By: Vinnie Langton M.D.   On: 12/06/2013 11:04    Post Admission Physician Evaluation: 1. Functional deficits secondary  to left thalamic hemorrhage with extension into the lat ventricle. 2. Patient is admitted to receive collaborative, interdisciplinary care between the physiatrist, rehab nursing staff, and therapy team. 3. Patient's level of medical complexity and substantial therapy needs in context of that medical necessity cannot be provided at a lesser intensity of care such as a SNF. 4. Patient has experienced substantial functional loss from his/her baseline which was documented above under the "Functional History" and "Functional Status" headings.   Judging by the patient's diagnosis, physical exam, and functional history, the  patient has potential for functional progress which will result in measurable gains while on inpatient rehab.  These gains will be of substantial and practical use upon discharge  in facilitating mobility and self-care at the household level. 5. Physiatrist will provide 24 hour management of medical needs as well as oversight of the therapy plan/treatment and provide guidance as appropriate regarding the interaction of the two. 6. 24 hour rehab nursing will assist with bladder management, bowel management, safety, skin/wound care, disease management, medication administration, pain management and patient education  and help integrate therapy concepts, techniques,education, etc. 7. PT will assess and treat for/with: Lower extremity strength, range of motion, stamina, balance, functional mobility, safety, adaptive techniques and equipment, processing, cognitive perceptual rx, apraxia mgt, family education, NMR.   Goals are: supervision to mod I . 8. OT will assess and treat for/with: ADL's, functional mobility, safety, upper extremity strength, adaptive techniques and equipment, NMR, processing, apraxia, cognitive perceptual awareness.   Goals are: supervision to mod I. 9. SLP will assess and treat for/with: cognition, processing, communication, education for family.  Goals are: supervision. 10. Case Management and Social Worker will assess and treat for psychological issues and discharge planning. 11. Team conference will be held weekly to assess progress toward goals and to determine barriers to discharge. 12. Patient will receive at least 3 hours of therapy per day at least 5 days per week. 13. ELOS: 12-17 days       14. Prognosis:  excellent   Medical Problem List and Plan: 1. 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 DVT Prophylaxis/Anticoagulation: SCDs. Monitor for any signs of DVT  3. Pain  Management: Tylenol as needed. Monitor with increased mobility 4. Neuropsych: This patient is not yet capable of making decisions on her own behalf. 5. Hypertension.   -remains poorly controlled  -increase Norvasc to 5mg  BID  -continue Avapro 300 mg daily, hydrochlorothiazide 12.5 mg daily, Lopressor 100 mg twice a day. Monitor with increased mobility   Meredith Staggers, MD, [facility]   12/08/2013

## 2013-12-08 NOTE — Therapy (Signed)
Speech Language Pathology Treatment: Dysphagia;Cognitive-Linquistic  Patient Details Name: Melissa [name] MRN: [id number] DOB: March 26, 1951 Today's Date: 12/08/2013 Time: 1010-1038 SLP Time Calculation (min): 28 min  Assessment / Plan / Recommendation Clinical Impression  Pt consumed D3 consistencies with thin via small sips with minimal verbal cueing needed; skilled intervention for po intake with oral holding and suspected delay in the initiation of the swallow and impaired (slow but thorough) mastication with solids; no s/s of aspiration noted with any consumption of liquids or D3 consistencies. Verbal expression techniques utilized and discussed with husband including using synonyms, describing items and using phonemic cues and carrier phrases during tx session; pt had difficulty retrieving personal information without moderate verbal cueing, but was able to complete automatic responses (ie: months of year, days of week, etc.) with minimal verbal cueing needed; increased frustration voiced by husband during speaking interactions intermittently.   HPI HPI: 63 y.o. female who was in her normal state of health earlier today. She had a dentist appt at 2:30 pm and then left to go home. She stopped by a [employer] to use the restroom, and after using it, sat down and became confused. EMS was called and she was taken to [facility] where a CT head showed a left thalamic hemorrhage with IVH.  CXR findings consistent with low-grade CHF with mild pulmonary interstitial edema.    Pertinent Vitals BP increased over last 24 hrs; no temp spikes noted  SLP Plan  Continue with current plan of care    Recommendations Diet recommendations: Dysphagia 3 (mechanical soft);Thin liquid (small sips; no straws) Liquids provided via: Cup (small sips) Medication Administration: Whole meds with puree Supervision: Patient able to self feed;Full supervision/cueing for compensatory strategies Compensations: Slow rate;Small  sips/bites Postural Changes and/or Swallow Maneuvers: Seated upright 90 degrees              Oral Care Recommendations: Oral care BID Follow up Recommendations: Inpatient Rehab Plan: Continue with current plan of care         Earnesteen Birnie,PAT, CCC-SLP 12/08/2013, 12:30 PM

## 2013-12-08 NOTE — Progress Notes (Signed)
Pt arrived to unit at 1755 with husband at bedside. Reviewed rehab process and booklet with family and pt. Pt aware of safety precautions. Bed alarm on, call bell within reach, SRx3 up and husband at bedside. No complaints of pain

## 2013-12-08 NOTE — Progress Notes (Signed)
Stroke Team Progress Note  HISTORY Melissa [name] is a 63 y.o. female who was in her normal state of health on 12/02/2013. She had a dentist appt at 2:30 pm and then left to go home. She stopped by a [employer] to use the restroom, and after using it, sat down and became confused. EMS was called and she was taken to [facility] where a CT head showed a left thalamic hemorrhage with IVH. Of note, she has also noticed swelling in her legs for the past couple of days and her urine has proteinuria. Patient was not administerd TPA secondary to hemorrhage. She was admitted to the neuro ICU for further evaluation and treatment.  SUBJECTIVE Her husband is at the bedside. RN reports she is having loose stools, not watery.   OBJECTIVE Most recent Vital Signs: Filed Vitals:   12/08/13 0204 12/08/13 0330 12/08/13 0507 12/08/13 0922  BP: 188/77 166/61 178/72 178/68  Pulse: 71 70 65 71  Temp: 98.5 F (36.9 C)  98.5 F (36.9 C) 97.6 F (36.4 C)  TempSrc: Oral  Oral Oral  Resp: 20  20 18   Height:      Weight:      SpO2: 93%  95% 93%   CBG (last 3)  No results found for this basename: GLUCAP,  in the last 72 hours  IV Fluid Intake:      MEDICATIONS  . amLODipine  5 mg Oral Daily  . antiseptic oral rinse  15 mL Mouth Rinse BID  . irbesartan  300 mg Oral Daily   And  . hydrochlorothiazide  12.5 mg Oral Daily  . metoprolol  100 mg Oral BID  . senna-docusate  1 tablet Oral BID   PRN:  acetaminophen, acetaminophen, labetalol  Diet:  Dysphagia III diet with thin liquids Activity: Up with assistance.  DVT Prophylaxis:  SCDs   CLINICALLY SIGNIFICANT STUDIES Basic Metabolic Panel:   Recent Labs Lab 12/02/13 1735 12/02/13 2033  NA 142 143  K 4.1 4.0  CL 99 100  CO2 32 30  GLUCOSE 101* 117*  BUN 18 19  CREATININE 0.97 1.08  CALCIUM 7.3* 9.4   Liver Function Tests:   Recent Labs Lab 12/02/13 1637  AST 20  ALT 21  ALKPHOS 105  BILITOT 0.8  PROT 7.2  ALBUMIN 3.5   CBC:   Recent  Labs Lab 12/02/13 1637  WBC 7.0  NEUTROABS 5.1  HGB 13.7  HCT 40.3  MCV 92.4  PLT 149*   Coagulation:   Recent Labs Lab 12/02/13 1637  LABPROT 13.1  INR 1.01   Cardiac Enzymes:   Recent Labs Lab 12/02/13 1637  TROPONINI <0.30   Urinalysis:   Recent Labs Lab 12/02/13 1743  COLORURINE YELLOW  LABSPEC >1.030*  PHURINE 5.5  GLUCOSEU NEGATIVE  HGBUR MODERATE*  BILIRUBINUR SMALL*  KETONESUR NEGATIVE  PROTEINUR >300*  UROBILINOGEN 0.2  NITRITE NEGATIVE  LEUKOCYTESUR NEGATIVE   Lipid Panel No results found for this basename: chol,  trig,  hdl,  cholhdl,  vldl,  ldlcalc   HgbA1C  Lab Results  Component Value Date   HGBA1C 6.9* 12/03/2013    Urine Drug Screen:     Component Value Date/Time   LABOPIA NONE DETECTED 12/02/2013 1743   COCAINSCRNUR NONE DETECTED 12/02/2013 1743   LABBENZ NONE DETECTED 12/02/2013 1743   AMPHETMU NONE DETECTED 12/02/2013 1743   THCU NONE DETECTED 12/02/2013 1743   LABBARB NONE DETECTED 12/02/2013 1743    Alcohol Level: No results found for  this basename: ETH,  in the last 168 hours   CT of the brain   12/03/2013     1. No significant change in acute intracranial hemorrhage within the left lentiform nucleus.  2. Mild rightward midline shift is stable.  3. Small amount of intraventricular hemorrhage is stable.    12/02/2013     Study is positive for a left laminectomy scratch the study is positive for acute left limit hemorrhage with associated intraventricular blood. Negative for hydrocephalus or midline shift. Mild prominence of left temporal tip is noted.    MRI of the brain  12/03/2013 Unchanged size of acute hemorrhage centered in the left thalamus with extension into the left lateral ventricle. No hydrocephalus.   MRA of the brain   Moderate chronic small vessel ischemic disease. No evidence of major intracranial arterial occlusion or high-grade stenosis. Mild bilateral carotid siphon atherosclerosis.  2D Echocardiogram  not  indicated  Carotid Doppler  not indicated  CXR   12/06/2013. Resolution of previously noted mild congestive heart failure. study demonstrates only borderline cardiomegaly with mild  pulmonary venous congestion, but no frank pulmonary edema.  12/02/2013   The findings are consistent with low-grade CHF with mild pulmonary interstitial edema.     EKG  sinus bradycardia. For complete results please see formal report.   Therapy Recommendations - inpatient rehabilitation   Physical Exam   Pleasant middle aged obese caucasian lady not in distress.Awake alert. Afebrile. Head is nontraumatic. Neck is supple without bruit. Hearing is normal. Cardiac exam no murmur or gallop. Lungs are clear to auscultation. Distal pulses are well felt. Neurological Exam :  Awake alert oriented x3. Nonfluent and hesitant speech which marked dysarthria and word finding difficulties. Good comprehension. Impaired naming and repetition.Able to read well. Extraocular moments are full range without nystagmus. Blinks to threat bilaterally. Fundi were not visualized. Vision  seems adequate. There is mild right lower facial weakness. Tongue midline.Motor system exam no upper or lower extremity drift. Mild weakness of right grip and intrinsic hand muscles. Fine finger movements are diminished right. Orbits left over right upper extremity. Touch, pinprick sensation are preserved. Gait was not tested. Plantars are both downgoing.  ASSESSMENT Ms. MIKELLA LINSLEY is a 63 y.o. female presenting with sudden onset confusion. Imaging confirms a left lentiform nucleus hemorrhage with intraventricular extension and cytotoxic cerebral edema. Hemorrhage felt to be secondary to accelerated hypertension, unable to confirm BPs obtained by EMS but they have been high and difficult to control in the hospital. On no antithrombotics prior to admission. 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 Stroke work up  completed.  Accelerated Hypertension - home meds restarted.  currently on Avapro, hydrochlorothiazide, and metoprolol  Amlodipine added/ Increased to 5 mg daily. BP remains elevated, receiving prn labetolol, though has not gone over 190. Proteinuria Uterine cancer Family hx stroke (grandfather) Hemoglobin A1c 6.9 CHF by admission chest x-ray, repeat CXR shows resolution of CHF.  Hospital day # 6  TREATMENT/PLAN  No further stroke workup  Await rehab bed  Medically ready for transfer to rehabilitation when bed available.  Burnetta Sabin, MSN, RN, ANVP-BC, ANP-BC, [facility] Pager: [phone] 12/08/2013 12:13 PM  I have personally obtained a history, examined the patient, evaluated imaging results, and formulated the assessment and plan of care. I agree with the above.  Antony Contras, MD

## 2013-12-08 NOTE — H&P (Deleted)
Pt arrived to unit at 1755 with husband at bedside. Reviewed rehab process and booklet with family and pt. Pt aware of safety precautions. Bed alarm on, call bell within reach, SRx3 up and husband at bedside. No complaints of pain  

## 2013-12-08 NOTE — Progress Notes (Signed)
Physical Therapy Treatment Patient Details Name: Melissa [name] MRN: [id number] DOB: March 04, 1951 Today's Date: 12/08/2013 Time: 1131-1150 PT Time Calculation (min): 19 min  PT Assessment / Plan / Recommendation  History of Present Illness Melissa [name] is a 63 y.o. female who was in her normal state of health earlier today 12/02/2013. She had a dentist appt at 2:30 pm and then left to go home. She stopped by a [employer] to use the restorrom, and after using it, sat down and became confused. EMS was called and she was taken to [place] where a CT head showed a left thalamic hemorrhage with IVH.    PT Comments   Pt with increased ability to ambulate, though continues to be impulsive and needs consistent cueing.  Still feel CIR is safest D/C plan.    Follow Up Recommendations  CIR     Does the patient have the potential to tolerate intense rehabilitation     Barriers to Discharge        Equipment Recommendations  Rolling walker with 5" wheels    Recommendations for Other Services    Frequency Min 4X/week   Progress towards PT Goals Progress towards PT goals: Progressing toward goals  Plan Current plan remains appropriate    Precautions / Restrictions Precautions Precautions: Fall Precaution Comments: difficulty motor planning Rt LE and UE; expressive aphasia  Restrictions Weight Bearing Restrictions: No   Pertinent Vitals/Pain Denied pain.      Mobility  Bed Mobility Overal bed mobility: Needs Assistance Bed Mobility: Supine to Sit Supine to sit: Mod assist General bed mobility comments: pt slightly impulsive and attempts to get OOB once PT mentions trying to amb.  cues to slow down and wait for A.   Transfers Overall transfer level: Needs assistance Equipment used: Rolling walker (2 wheeled) Transfers: Sit to/from Stand Sit to Stand: Min assist;+2 physical assistance General transfer comment: cues for UE use and controlling descent to sitting.  Facilitation for  positioning LEs prior to standing.   Ambulation/Gait Ambulation/Gait assistance: Min assist;+2 physical assistance Ambulation Distance (Feet): 15 Feet (x2) Assistive device: Rolling walker (2 wheeled) Gait Pattern/deviations: Step-to pattern;Decreased step length - left;Decreased stance time - right;Shuffle;Narrow base of support;Staggering right General Gait Details: Provided step-by-step cues for pt to sequence gait and to maintain slow speed as pt tends to rush causing decreased balance and R lateral LOB.   Modified Rankin (Stroke Patients Only) Pre-Morbid Rankin Score: No symptoms Modified Rankin: Moderately severe disability    Exercises     PT Diagnosis:    PT Problem List:   PT Treatment Interventions:     PT Goals (current goals can now be found in the care plan section) Acute Rehab PT Goals Patient Stated Goal: none stated Time For Goal Achievement: 12/18/13 Potential to Achieve Goals: Good  Visit Information  Last PT Received On: 12/08/13 Assistance Needed: +2 History of Present Illness: Melissa [name] is a 63 y.o. female who was in her normal state of health earlier today 12/02/2013. She had a dentist appt at 2:30 pm and then left to go home. She stopped by a [employer] to use the restorrom, and after using it, sat down and became confused. EMS was called and she was taken to [place] where a CT head showed a left thalamic hemorrhage with IVH.     Subjective Data  Patient Stated Goal: none stated   Cognition  Cognition Arousal/Alertness: Awake/alert Behavior During Therapy: Impulsive Overall Cognitive Status: Impaired/Different from baseline Area  of Impairment: Orientation;Attention;Memory;Following commands;Safety/judgement;Awareness;Problem solving Orientation Level: Disoriented to;Situation;Time Current Attention Level: Sustained Following Commands: Follows one step commands inconsistently Safety/Judgement: Decreased awareness of safety;Decreased awareness of  deficits Awareness: Emergent;Anticipatory Problem Solving: Slow processing;Decreased initiation;Difficulty sequencing;Requires verbal cues;Requires tactile cues General Comments: pt does best with step-by-step cueing and pt attemptsto answer questions, though answers are incorrect per husband.   Difficult to assess due to: Impaired communication    Balance  Balance Overall balance assessment: Needs assistance Postural control: Right lateral lean Standing balance support: Bilateral upper extremity supported Standing balance-Leahy Scale: Poor  End of Session PT - End of Session Equipment Utilized During Treatment: Gait belt Activity Tolerance: Patient tolerated treatment well Patient left: in chair;with call bell/phone within reach;with chair alarm set;with family/visitor present Nurse Communication: Mobility status   GP     Catarina Hartshorn, [place] 12/08/2013, 12:05 PM

## 2013-12-08 NOTE — Interval H&P Note (Signed)
Melissa [name] was admitted today to Inpatient Rehabilitation with the diagnosis of left thalamic hemorrhage.  The patient's history has been reviewed, patient examined, and there is no change in status.  Patient continues to be appropriate for intensive inpatient rehabilitation.  I have reviewed the patient's chart and labs.  Questions were answered to the patient's satisfaction.  SWARTZ,ZACHARY T 12/08/2013, 8:45 PM

## 2013-12-09 ENCOUNTER — Inpatient Hospital Stay (HOSPITAL_COMMUNITY): Payer: 59

## 2013-12-09 ENCOUNTER — Inpatient Hospital Stay (HOSPITAL_COMMUNITY): Payer: 59 | Admitting: Speech Pathology

## 2013-12-09 ENCOUNTER — Inpatient Hospital Stay (HOSPITAL_COMMUNITY): Payer: 59 | Admitting: Physical Therapy

## 2013-12-09 LAB — CBC WITH DIFFERENTIAL/PLATELET
Basophils Absolute: 0 10*3/uL (ref 0.0–0.1)
Basophils Relative: 0 % (ref 0–1)
EOS ABS: 0.3 10*3/uL (ref 0.0–0.7)
EOS PCT: 3 % (ref 0–5)
HEMATOCRIT: 37.9 % (ref 36.0–46.0)
HEMOGLOBIN: 13.1 g/dL (ref 12.0–15.0)
LYMPHS ABS: 1 10*3/uL (ref 0.7–4.0)
Lymphocytes Relative: 9 % — ABNORMAL LOW (ref 12–46)
MCH: 32 pg (ref 26.0–34.0)
MCHC: 34.6 g/dL (ref 30.0–36.0)
MCV: 92.7 fL (ref 78.0–100.0)
MONO ABS: 0.9 10*3/uL (ref 0.1–1.0)
MONOS PCT: 9 % (ref 3–12)
Neutro Abs: 8.4 10*3/uL — ABNORMAL HIGH (ref 1.7–7.7)
Neutrophils Relative %: 79 % — ABNORMAL HIGH (ref 43–77)
Platelets: 159 10*3/uL (ref 150–400)
RBC: 4.09 MIL/uL (ref 3.87–5.11)
RDW: 12.8 % (ref 11.5–15.5)
WBC: 10.6 10*3/uL — ABNORMAL HIGH (ref 4.0–10.5)

## 2013-12-09 LAB — COMPREHENSIVE METABOLIC PANEL
ALT: 17 U/L (ref 0–35)
AST: 15 U/L (ref 0–37)
Albumin: 3.1 g/dL — ABNORMAL LOW (ref 3.5–5.2)
Alkaline Phosphatase: 94 U/L (ref 39–117)
BUN: 12 mg/dL (ref 6–23)
CO2: 32 mEq/L (ref 19–32)
CREATININE: 0.82 mg/dL (ref 0.50–1.10)
Calcium: 9.4 mg/dL (ref 8.4–10.5)
Chloride: 97 mEq/L (ref 96–112)
GFR calc non Af Amer: 75 mL/min — ABNORMAL LOW (ref >90)
GFR, EST AFRICAN AMERICAN: 86 mL/min — AB (ref >90)
GLUCOSE: 139 mg/dL — AB (ref 70–99)
POTASSIUM: 3.9 meq/L (ref 3.7–5.3)
Sodium: 137 mEq/L (ref 137–147)
TOTAL PROTEIN: 7.2 g/dL (ref 6.0–8.3)
Total Bilirubin: 1.7 mg/dL — ABNORMAL HIGH (ref 0.3–1.2)

## 2013-12-09 NOTE — Evaluation (Signed)
Occupational Therapy Assessment and Plan  Patient Details  Name: Melissa [name] MRN: [id number] Date of Birth: 1951-06-02  OT Diagnosis: apraxia, cognitive deficits, hemiplegia affecting dominant side and muscle weakness (generalized) Rehab Potential: Rehab Potential: Good ELOS: 17-21 days   Today's Date: 12/09/2013 Time: 0730-0830 and [id number] Time Calculation (min): 60 min and 28 min   Problem List:  Patient Active Problem List   Diagnosis Date Noted  . Thalamic hemorrhage 12/08/2013  . Cerebral hemorrhage, acute 12/02/2013  . 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 ADENOCARCINOMA, ENDOMETRIUM 08/11/2007  . HYPERTHYROIDISM 05/16/2007  . HYPERTENSION 05/16/2007  . OSTEOARTHRITIS 05/16/2007    Past Medical History:  Past Medical History  Diagnosis Date  . Hypertension   . Uterine cancer    Past Surgical History:  Past Surgical History  Procedure Laterality Date  . Replacement total knee bilateral    . Cholecystectomy    . Abdominal hysterectomy      Assessment & Plan Clinical Impression: Patient is a 63 y.o. right-handed female with history of hypertension. 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 CT and MRI imaging revealed acute hemorrhage centered in the left thalamus with extension into the left lateral ventricle no hydrocephalus. MRA of the head with no major occlusion or stenosis. Hemorrhage felt to be secondary to possible accelerated hypertension. TPA was not administered secondary to hemorrhage. Neurology services consulted with conservative care. Currently on a dysphagia 3 and liquid diet. Close monitoring of blood pressure. Patient transferred to CIR on 12/08/2013 .    Patient currently requires total-max with basic self-care skills secondary to muscle weakness, decreased cardiorespiratoy endurance, impaired timing and sequencing, motor apraxia,  decreased coordination and decreased motor planning, decreased motor planning, decreased initiation, decreased attention, decreased awareness, decreased problem solving, decreased safety awareness, decreased memory and delayed processing and decreased standing balance, decreased postural control and decreased balance strategies.  Prior to hospitalization, patient could complete BADLs and IADLs with independent .  Patient will benefit from skilled intervention to increase independence with basic self-care skills prior to discharge home with care partner.  Anticipate patient will require 24 hour supervision and follow up home health.  OT - End of Session Activity Tolerance: Decreased this session Endurance Deficit: Yes OT Assessment Rehab Potential: Good OT Patient demonstrates impairments in the following area(s): Balance;Cognition;Endurance;Motor;Perception;Safety;Sensory;Vision OT Basic ADL's Functional Problem(s): Grooming;Eating;Bathing;Dressing;Toileting OT Transfers Functional Problem(s): Toilet;Tub/Shower OT Additional Impairment(s): Fuctional Use of Upper Extremity OT Plan OT Intensity: Minimum of 1-2 x/day, 45 to 90 minutes OT Frequency: 5 out of 7 days OT Duration/Estimated Length of Stay: 17-21 days OT Treatment/Interventions: Balance/vestibular training;Cognitive remediation/compensation;Discharge planning;DME/adaptive equipment instruction;Functional mobility training;Patient/family education;Psychosocial support;Self Care/advanced ADL retraining;Therapeutic Activities;Therapeutic Exercise;UE/LE Strength taining/ROM;UE/LE Coordination activities;Visual/perceptual remediation/compensation OT Self Feeding Anticipated Outcome(s): supervision OT Basic Self-Care Anticipated Outcome(s): supervision OT Toileting Anticipated Outcome(s): supervison OT Bathroom Transfers Anticipated Outcome(s): supervision OT Recommendation Patient destination: Home Follow Up Recommendations: Home health  OT Equipment Recommended: 3 in 1 bedside comode;Tub/shower seat   Skilled Therapeutic Intervention Session 1: Pt seen for 1:1 OT session with focus on functional transfers, sit<>stand, attention to task, and safety awareness. Pt received supine in bed. Attempted to completed squat pivot transfer bed>w/c after therapist providing demonstration and verbal instruction however pt began attempting to scoot forward off bed despite cues from therapist. Practiced sit<>stand with max assist for initiation and mod assist once in standing. With increased time pt completed max assist  stand pivot transfer bed>w/c with max assist. Pt very impulsive throughout. Competed bathing and dressing from w/c at sink. Pt with impaired sustained attention requiring mod cues for redirection to task and for sequencing. Pt required max assist for initiation of sit>stand at sink and mod assist for balance at sink as therapist assisted. Pt with impaired motor planning and apraxia impacting sit<>stand. Presented pt with clothing items and she would giggle each time and require increased time before initiation of donning. Pt attempting to don clothing correct so no dressing apraxia noted at this time. Pt with yes/no accuracy approx 50% of therapy session. At end of session pt left sitting in w/c with NT present and QRB d/t impaired safety awareness. All needs in reach.   Session 2: Pt seen for 1:1 OT session with focus on toilet transfers and overall cognition. Pt received sitting in w/c. Completed squat pivot transfer w/c<>BSC with total assist and max cues. Pt required manual facilitation for anterior weight shift as pt attempting to go into extension with each movement. Pt with good strength to complete task however required total assist secondary to motor planning deficits. Completed oral care at sink. Presented toothbrush and toothpaste and pt acknowledging both and attempting to verbalize items. Cued pt to brush teeth. Pt required cues  to apply toothpaste as she initially began brushing teeth without toothpaste. After increased time pt applied toothpaste and completed task. Pt left sitting in w/c with all needs in reach. QRB donned and son present.   OT Evaluation Precautions/Restrictions  Precautions Precautions: Fall Precaution Comments: Impaired motor planning RUE/LE. 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); yes/no questions and demonstration most effective Restrictions Weight Bearing Restrictions: No General   Vital Signs   Pain Pain Assessment Pain Assessment: No/denies pain Pain Score: 0-No pain Home Living/Prior Functioning Home Living Living Arrangements: Spouse/significant other;Children Available Help at Discharge: Family;Available 24 hours/day Type of Home: House Home Access: Stairs to enter [employer] of Steps: 2 Entrance Stairs-Rails: None Home Layout: One level  Lives With: Spouse Prior Function Level of Independence: Independent with basic ADLs;Independent with homemaking with ambulation;Independent with gait;Independent with transfers  Able to Take Stairs?: Yes Driving: Yes Vocation: Full time employment 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, mental health - History Baseline Vision: No visual deficits Patient Visual Report: No change from baseline Vision - Assessment Eye Alignment: Within Functional Limits Vision Assessment: Vision tested Ocular Range of Motion: Within Functional Limits Alignment/Gaze Preference: Within Defined Limits Tracking/Visual Pursuits: Able to track stimulus in all quads without difficulty Visual Fields: No apparent deficits Perception Perception: Within Functional Limits Praxis Praxis: Impaired Praxis Impairment Details: Motor planning;Perseveration  Cognition Overall Cognitive Status: Impaired/Different from baseline Arousal/Alertness: Lethargic Orientation Level: Other (comment);Oriented to  person (expressive aphasia; oriented to time with yes/no) Attention: Sustained Sustained Attention: Impaired Memory: Impaired Awareness: Impaired Problem Solving: Impaired Behaviors: Impulsive;Perseveration Safety/Judgment: Impaired Sensation Sensation Light Touch: Appears Intact (difficult to assess d/t global aphasia) Coordination Gross Motor Movements are Fluid and Coordinated: No Fine Motor Movements are Fluid and Coordinated: No Finger Nose Finger Test: decreased speed with RUE Motor  Motor Motor: Abnormal postural alignment and control;Other (comment) Motor - Skilled Clinical Observations: Impaired motor planning. With dynamic sitting, pt exhibits posterior, R-sided trunk lean.  Mobility  Bed Mobility Bed Mobility: Supine to Sit Supine to Sit: 3: Mod assist;HOB flat Supine to Sit Details: Tactile cues for initiation;Verbal cues for precautions/safety Supine to Sit Details (indicate cue type and reason):  Mod A to lift trunk Sitting - Scoot to Edge of Bed: 2: Max assist Sitting - Scoot to [employer] of Bed Details: Tactile cues for initiation;Manual facilitation for weight shifting;Visual cues/gestures for sequencing Sit to Supine: 3: Mod assist;HOB flat Sit to Supine - Details: Visual cues/gestures for precautions/safety;Verbal cues for precautions/safety Sit to Supine - Details (indicate cue type and reason): Mod A for bilat LE management; multimodal cueing for technique, to prevent pt from pushing straight backward   Trunk/Postural Assessment     Balance   Extremity/Trunk Assessment RUE Assessment RUE Assessment:  (coordination deficits) LUE Assessment LUE Assessment: Within Functional Limits  FIM:  FIM - Grooming Grooming Steps: Wash, rinse, dry face;Wash, rinse, dry hands Grooming: 5: Set-up assist to obtain items FIM - Bathing Bathing Steps Patient Completed: Chest;Right Arm;Left Arm;Abdomen;Right upper leg;Left upper leg Bathing: 3: Mod-Patient completes 5-7 [id number]  parts or 50-74% FIM - Upper Body Dressing/Undressing Upper body dressing/undressing steps patient completed: Thread/unthread left sleeve of pullover shirt/dress;Put head through opening of pull over shirt/dress;Pull shirt over trunk Upper body dressing/undressing: 2: Max-Patient completed 25-49% of tasks FIM - Lower Body Dressing/Undressing Lower body dressing/undressing: 1: Total-Patient completed less than 25% of tasks FIM - BControl and instrumentation engineerDevices: Arm rests Bed/Chair Transfer: 3: Supine > Sit: Mod A (lifting assist/Pt. 50-74%/lift 2 legs;2: Chair or W/C > Bed: Max A (lift and lower assist);3: Sit > Supine: Mod A (lifting assist/Pt. 50-74%/lift 2 legs);1: Bed > Chair or W/C: Total A (helper does all/Pt. < 25%)   Refer to Care Plan for Long Term Goals  Recommendations for other services: None  Discharge Criteria: Patient will be discharged from OT if patient refuses treatment 3 consecutive times without medical reason, if treatment goals not met, if there is a change in medical status, if patient makes no progress towards goals or if patient is discharged from hospital.  The above assessment, treatment plan, treatment alternatives and goals were discussed and mutually agreed upon: by patient  PDuayne Cal3/08/2014, 12:34 PM

## 2013-12-09 NOTE — Patient Care Conference (Signed)
Inpatient RehabilitationTeam Conference and Plan of Care Update Date: 12/09/2013   Time: 10:30 AM    Patient Name: Melissa [name]      Medical Record Number: [id number]  Date of Birth: [birth year] Sex: Female         Room/Bed: 4W06C/4W06C-01 Payor Info: Payor: [employer] / Plan: [place] [employer] / Product Type: *No Product type* /    Admitting Diagnosis: L THALAMIC HEMORRHAGE  Admit Date/Time:  12/08/2013  5:55 PM Admission Comments: No comment available   Primary Diagnosis:  <principal problem not specified> Principal Problem: <principal problem not specified>  Patient Active Problem List   Diagnosis Date Noted  . Thalamic hemorrhage 12/08/2013  . Cerebral hemorrhage, acute 12/02/2013  . 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 ADENOCARCINOMA, ENDOMETRIUM 08/11/2007  . HYPERTHYROIDISM 05/16/2007  . HYPERTENSION 05/16/2007  . OSTEOARTHRITIS 05/16/2007    Expected Discharge Date: Expected Discharge Date: 12/29/13  Team Members Present: Physician leading conference: Dr. Alysia Penna Social Worker Present: Ovidio Kin, LCSW;Jenny Prevatt, LCSW Nurse Present: Junius Creamer, RN PT Present: Georjean Mode, PT;Blair Hobble, PT OT Present: Gareth Morgan, Lorelee Cover, OT PPS Coordinator present : Ileana Ladd, Lelan Pons, RN, CRRN     Current Status/Progress Goal Weekly Team Focus  Medical   severe aphasia, heavy physical assist  improve communication and ability to follow verbal commands  gestural cuing   Bowel/Bladder   Inc of bowel and bladder. LBM 12/08/2013  Min assist  Up to bathroom during the day; bedpan at night   Swallow/Nutrition/ Hydration   eval pending         ADL's   total assist LB dressing, max assist UB dressing, max assist functional transfers, mod assist bathing   supervision overall   functional transfers, cognition, activity tolerance, FM coordination R, sit<>stand, family education   Mobility   Max-[place] A with functional  transfers; max A with w/c mobility  Supervision overall with exception of Min A with gait and stairs  Functional transfers, bed mobility, activity tolerance, initiate gait and stairs (if safe), initiate family education   Communication   eval pending         Safety/Cognition/ Behavioral Observations  eval pending          Pain   Denied pain  Pain managed at 2 or less  Assess for pain q shift   Skin   Reddened, blanchable area to buttocks  No new skin breakdown  Assess skin q shift; educate patient and family on the importance of skin care      *See Care Plan and progress notes for long and short-term goals.  Barriers to Discharge: heavy assist    Possible Resolutions to Barriers:  see above cont rehab CIR level    Discharge Planning/Teaching Needs:  HOme with husband and son arrainging 24 hr care while work and then provide care at night.      Team Discussion:  MD feels once swelling goes down will make good progress.  Impulsive and aphasic.  Goals-supervision/min level  Revisions to Treatment Plan:  New eval   Continued Need for Acute Rehabilitation Level of Care: The patient requires daily medical management by a physician with specialized training in physical medicine and rehabilitation for the following conditions: Daily direction of a multidisciplinary physical rehabilitation program to ensure safe treatment while eliciting the highest outcome that is of practical value to the patient.: Yes Daily medical management of patient stability for increased activity during participation in an intensive rehabilitation regime.:  Yes Daily analysis of laboratory values and/or radiology reports with any subsequent need for medication adjustment of medical intervention for : Neurological problems;Other  Susie Pousson, Gardiner Rhyme 12/10/2013, 8:22 AM

## 2013-12-09 NOTE — Care Management Note (Signed)
Inpatient Rehabilitation Center Individual Statement of Services  Patient Name:  Melissa [name]  Date:  12/09/2013  Welcome to the [facility].  Our goal is to provide you with an individualized program based on your diagnosis and situation, designed to meet your specific needs.  With this comprehensive rehabilitation program, you will be expected to participate in at least 3 hours of rehabilitation therapies Monday-Friday, with modified therapy programming on the weekends.  Your rehabilitation program will include the following services:  Physical Therapy (PT), Occupational Therapy (OT), Speech Therapy (ST), 24 hour per day rehabilitation nursing, Therapeutic Recreaction (TR), Neuropsychology, Case Management (Social Worker), Rehabilitation Medicine, Nutrition Services and Pharmacy Services  Weekly team conferences will be held on Wednesday to discuss your progress.  Your Social Worker will talk with you frequently to get your input and to update you on team discussions.  Team conferences with you and your family in attendance may also be held.  Expected length of stay: 3 weeks Overall anticipated outcome: supervision/min level  Depending on your progress and recovery, your program may change. Your Social Worker will coordinate services and will keep you informed of any changes. Your Social Worker's name and contact numbers are listed  below.  The following services may also be recommended but are not provided by the [facility] will be made to provide these services after discharge if needed.  Arrangements include referral to agencies that provide these services.  Your insurance has been verified to be:  [employer] Your primary doctor is:  Dr Phoebe Sharps  Pertinent information will be shared with your doctor and  your insurance company.  Social Worker:  Ovidio Kin, [place] or ([phone]  Information discussed with and copy given to patient by: Elease Hashimoto, 12/09/2013, 9:28 AM

## 2013-12-09 NOTE — Progress Notes (Signed)
Patient information reviewed and entered into eRehab system by Charisa Twitty, RN, CRRN, PPS Coordinator.  Information including medical coding and functional independence measure will be reviewed and updated through discharge.    

## 2013-12-09 NOTE — Evaluation (Signed)
Physical Therapy Assessment and Plan  Patient Details  Name: Melissa [name] MRN: [id number] Date of Birth: [birth year]  PT Diagnosis: Abnormal posture, Abnormality of gait, Cognitive deficits, Impaired sensation and Muscle weakness Rehab Potential: Good ELOS: 17-20   Today's Date: 12/09/2013 Time: 0901-1000 Time Calculation (min): 59 min  Problem List:  Patient Active Problem List   Diagnosis Date Noted  . Thalamic hemorrhage 12/08/2013  . Cerebral hemorrhage, acute 12/02/2013  . 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 ADENOCARCINOMA, ENDOMETRIUM 08/11/2007  . HYPERTHYROIDISM 05/16/2007  . HYPERTENSION 05/16/2007  . OSTEOARTHRITIS 05/16/2007    Past Medical History:  Past Medical History  Diagnosis Date  . Hypertension   . Uterine cancer    Past Surgical History:  Past Surgical History  Procedure Laterality Date  . Replacement total knee bilateral    . Cholecystectomy    . Abdominal hysterectomy      Assessment & Plan Clinical Impression: Melissa [name] is a 63 y.o. right-handed female with history of hypertension. 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 CT and MRI imaging revealed acute hemorrhage centered in the left thalamus with extension into the left lateral ventricle no hydrocephalus. MRA of the head with no major occlusion or stenosis. Hemorrhage felt to be secondary to possible accelerated hypertension. TPA was not administered secondary to hemorrhage. Neurology services consulted with conservative care. Currently on a dysphagia 3 and liquid diet. Close monitoring of blood pressure. Patient transferred to CIR on 12/08/2013 .   Patient currently requires max-total A with mobility secondary to muscle weakness, decreased cardiorespiratoy endurance, impaired timing and sequencing, unbalanced muscle activation, motor apraxia, decreased coordination and  decreased motor planning, decreased initiation, decreased attention, decreased awareness, decreased problem solving, decreased safety awareness, decreased memory and delayed processing and decreased sitting balance, decreased standing balance, decreased postural control and decreased balance strategies.  Prior to hospitalization, patient was independent  with mobility and lived with Spouse in a House home.  Home access is 2Stairs to enter.  Patient will benefit from skilled PT intervention to maximize safe functional mobility and minimize fall risk for planned discharge home with 24 hour assist.  Anticipate patient will benefit from follow up [facility] at discharge.  PT - End of Session Activity Tolerance: Tolerates 30+ min activity with multiple rests Endurance Deficit: Yes PT Assessment Rehab Potential: Good Barriers to Discharge: Other (comment) (Per son, 24/7 supervision/assist will be available at D/C) PT Patient demonstrates impairments in the following area(s): Balance;Endurance;Motor;Safety;Sensory;Other (comment) (Cognition) PT Transfers Functional Problem(s): Bed Mobility;Bed to Chair;Car;Furniture PT Locomotion Functional Problem(s): Ambulation;Wheelchair Mobility;Stairs PT Plan PT Intensity: Minimum of 1-2 x/day ,45 to 90 minutes PT Frequency: 5 out of 7 days PT Duration Estimated Length of Stay: 17-20 PT Treatment/Interventions: Ambulation/gait training;Balance/vestibular training;Cognitive remediation/compensation;Discharge planning;Disease management/prevention;Functional mobility training;Pain management;Patient/family education;Neuromuscular re-education;DME/adaptive equipment instruction;Wheelchair propulsion/positioning;UE/LE Coordination activities;UE/LE Strength taining/ROM;Stair training;Splinting/orthotics;Therapeutic Exercise;Therapeutic Activities PT Transfers Anticipated Outcome(s): Supervision to Min A PT Locomotion Anticipated Outcome(s): Min A to Mod A PT  Recommendation Recommendations for Other Services: Other (comment) (None at this time) Follow Up Recommendations: 24 hour supervision/assistance;Home health PT Equipment Recommended: To be determined Equipment Details: Pt does not own any personal assistive devices.  Skilled Therapeutic Intervention PT evaluation performed. See below for detailed findings. Treatment initiated. Session focused on bed mobility, w/c mobility, functional transfers, and static/dynamic sitting balance. Pt performed supine<>sit with mod A, multimodal cueing for safe/proper technique; see below for further  detail. Pt performed w/c mobility x25' in controlled environment with max A, manual facilitation of bilat UE propulsion technique for initial 10'; however, pt with difficulty performing technique. Performed remainder of trial using bilat LE's with mod A. Pt performed scoot pivot from w/c>bed (to L side) with max A; manual facilitation and cueing as described below. Sit<>stand with max A from w/c. Static standing with mod A for stability/balance. Stand pivot from bed>w/c (to R side) with Total A secondary to pt difficulty pivoting. Therapist departed with pt seated in w/c with son present, quick release belt on for safety, and all needs within reach.   PT Evaluation Precautions/Restrictions Precautions Precautions: Fall Precaution Comments: Impaired motor planning RUE/LE. 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); yes/no questions and demonstration most effective Restrictions Weight Bearing Restrictions: No General   Vital SignsTherapy Vitals Temp: 98.6 F (37 C) Temp src: Oral Pulse Rate: 71 Resp: 18 BP: 145/83 mmHg Patient Position, if appropriate: Sitting Oxygen Therapy SpO2: 93 % O2 Device: None (Room air) Pain Pain Assessment Pain Assessment: No/denies pain Home Living/Prior Functioning Home Living Living Arrangements: Spouse/significant other;Children Available Help at Discharge: Family;Available 24  hours/day Type of Home: House Home Access: Stairs to enter [employer] of Steps: 2 Entrance Stairs-Rails: None Home Layout: One level  Lives With: Spouse Prior Function Level of Independence: Independent with basic ADLs;Independent with homemaking with ambulation;Independent with gait;Independent with transfers  Able to Take Stairs?: Yes Driving: Yes Vocation: Full time employment Vocation Requirements: LPN Comments: Pt works full time as a Scientist, physiological - History Baseline Vision: No visual deficits Patient Visual Report: No change from baseline Vision - Assessment Eye Alignment: Within Functional Limits Vision Assessment: Vision tested Ocular Range of Motion: Within Functional Limits Alignment/Gaze Preference: Within Defined Limits Tracking/Visual Pursuits: Able to track stimulus in all quads without difficulty Visual Fields: No apparent deficits Perception Perception: Within Functional Limits Praxis Praxis: Impaired Praxis Impairment Details: Motor planning;Perseveration  Cognition Overall Cognitive Status: Impaired/Different from baseline Arousal/Alertness: Lethargic Orientation Level: Oriented to person;Oriented to place;Oriented to time;Disoriented to situation Attention: Sustained Sustained Attention: Impaired Sustained Attention Impairment: Verbal basic;Functional basic Memory: Impaired Memory Impairment: Decreased short term memory;Decreased recall of new information;Prospective memory Decreased Short Term Memory: Verbal basic;Functional basic Awareness: Impaired Awareness Impairment: Emergent impairment;Anticipatory impairment Problem Solving: Impaired Problem Solving Impairment: Verbal basic;Functional basic Executive Function:  (all impaired due to lower level deficits) Behaviors: Impulsive;Perseveration Safety/Judgment: Impaired Comments: cognitive deficits impacted by aphasia Sensation Sensation Light Touch: Appears Intact  (difficult to assess d/t global aphasia) Coordination Gross Motor Movements are Fluid and Coordinated: No Fine Motor Movements are Fluid and Coordinated: No Finger Nose Finger Test: decreased speed with RUE Motor  Motor Motor: Abnormal postural alignment and control;Other (comment) Motor - Skilled Clinical Observations: Impaired motor planning. With dynamic sitting, pt exhibits posterior, R-sided trunk lean.   Mobility Bed Mobility Bed Mobility: Supine to Sit Supine to Sit: 3: Mod assist;HOB flat Supine to Sit Details: Tactile cues for initiation;Verbal cues for precautions/safety Sitting - Scoot to Edge of Bed: 2: Max assist Sitting - Scoot to [employer] of Bed Details: Tactile cues for initiation;Manual facilitation for weight shifting;Visual cues/gestures for sequencing Sit to Supine: 3: Mod assist;HOB flat Sit to Supine - Details: Visual cues/gestures for precautions/safety;Verbal cues for precautions/safety Transfers Transfers: Yes Sit to Stand: 2: Max assist;From chair/3-in-1;With armrests Sit to Stand Details: Manual facilitation for weight shifting;Visual cues/gestures for sequencing;Tactile cues for sequencing Stand to Sit: 2: Max assist;With armrests;To chair/3-in-1 Stand to Sit Details (indicate cue  type and reason): Manual facilitation for weight shifting;Visual cues/gestures for sequencing;Tactile cues for sequencing Lateral/Scoot Transfers: With armrests removed;2: Max assist Lateral/Scoot Transfer Details: Tactile cues for initiation;Manual facilitation for weight shifting;Manual facilitation for placement;Tactile cues for sequencing;Visual cues/gestures for sequencing Locomotion  Ambulation Ambulation: No (Unsafe at this time secondary to [facility] for stand pivot transfer, pt impulsivity) Stairs / Additional Locomotion Stairs:  (Not assessed; unsafe at this time) Product manager Mobility: Yes Wheelchair Assistance: 2: Max Technical sales engineer Details:  Tactile cues for initiation;Visual cues/gestures for sequencing;Manual facilitation for weight bearing;Tactile cues for sequencing Wheelchair Propulsion: Both lower extermities (Attempted w/c propulsion with bilat UE's x10'; however, pt with difficulty with technique. Performed remainder of trial using bilat LE's) Wheelchair Parts Management: Needs assistance Distance: 25  Trunk/Postural Assessment  Cervical Assessment Cervical Assessment: Within Functional Limits Thoracic Assessment Thoracic Assessment: Within Functional Limits Lumbar Assessment Lumbar Assessment: Within Functional Limits Postural Control Postural Control: Deficits on evaluation Trunk Control: With dynamic sitting, pt exhibits lateral trunk lean to R side, posterior trunk lean. Righting Reactions: Ineffective with posterior LOB in seated. Postural Limitations: In static sitting, pt demonstrates posterior pelvic tilt and majority of weightbearing at L hip. R hip tends to drift forward in seated.  Balance Balance Balance Assessed: Yes Static Sitting Balance Static Sitting - Balance Support: No upper extremity supported;Feet supported Static Sitting - Level of Assistance: 5: Stand by assistance Static Sitting - Comment/# of Minutes: >5 minutes without LOB; SBA secondary to trunk lean to R, intermittent posterior trunk lean Dynamic Sitting Balance Dynamic Sitting - Balance Support: Feet supported Dynamic Sitting - Level of Assistance: 4: Min assist Dynamic Standing Balance Dynamic Standing - Balance Support: No upper extremity supported;During functional activity Dynamic Standing - Level of Assistance: 3: Mod assist Extremity Assessment  RUE Assessment RUE Assessment:  (coordination deficits) LUE Assessment LUE Assessment: Within Functional Limits RLE Assessment RLE Assessment: Within Functional Limits LLE Assessment LLE Assessment: Within Functional Limits  FIM:  FIM - Buyer, retail Devices: Arm rests Bed/Chair Transfer: 3: Supine > Sit: Mod A (lifting assist/Pt. 50-74%/lift 2 legs;2: Chair or W/C > Bed: Max A (lift and lower assist);3: Sit > Supine: Mod A (lifting assist/Pt. 50-74%/lift 2 legs);1: Bed > Chair or W/C: Total A (helper does all/Pt. < 25%) FIM - Locomotion: Wheelchair Distance: 25 Locomotion: Wheelchair: 1: Travels less than 50 ft with maximal assistance (Pt: 25 - 49%) FIM - Locomotion: Ambulation Locomotion: Ambulation: 0: Activity did not occur (Not assessed; unsafe at the time) FIM - Locomotion: Stairs Locomotion: Stairs:  (Not assessed; unsafe at the time)   Refer to Care Plan for Long Term Goals  Recommendations for other services: None  Discharge Criteria: Patient will be discharged from PT if patient refuses treatment 3 consecutive times without medical reason, if treatment goals not met, if there is a change in medical status, if patient makes no progress towards goals or if patient is discharged from hospital.  The above assessment, treatment plan, treatment alternatives and goals were discussed and mutually agreed upon: by patient and by family  Stefano Gaul 12/09/2013, 5:17 PM

## 2013-12-09 NOTE — Progress Notes (Signed)
Social Work Assessment and Plan Social Work Assessment and Plan  Patient Details  Name: Melissa [name] MRN: [id number] Date of Birth: [birth year]  Today's Date: 12/09/2013  Problem List:  Patient Active Problem List   Diagnosis Date Noted  . Thalamic hemorrhage 12/08/2013  . Cerebral hemorrhage, acute 12/02/2013  . 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 ADENOCARCINOMA, ENDOMETRIUM 08/11/2007  . HYPERTHYROIDISM 05/16/2007  . HYPERTENSION 05/16/2007  . OSTEOARTHRITIS 05/16/2007   Past Medical History:  Past Medical History  Diagnosis Date  . Hypertension   . Uterine cancer    Past Surgical History:  Past Surgical History  Procedure Laterality Date  . Replacement total knee bilateral    . Cholecystectomy    . Abdominal hysterectomy     Social History:  reports that she has never smoked. She does not have any smokeless tobacco history on file. She reports that she does not drink alcohol or use illicit drugs.  Family / Support Systems Marital Status: Married Patient Roles: Spouse;Parent;Other (Comment) Scientist, clinical (histocompatibility and immunogenetics)) Spouse/Significant Other: [name]  [phone] Children: Son loyal and supportive Other Supports: Friends and colleagues Anticipated Caregiver: Husband and son Ability/Limitations of Caregiver: They will work on a 24 hr discharge plan.  Both work for [employer] Caregiver Availability: Other (Comment) (Working on 24 hr plan) Family Dynamics: Close knit family who are willing to assist and will be here to provide support to pt while here and observe her in therapies.  Husband and son are very committed to pt and will make sure she has what she needs.  Social History Preferred language: English Religion: Baptist Cultural Background: No issues Education: LPN Read: Yes Write: Yes Employment Status: Employed Name of Employer: [employer] Return to Work Plans: Unsure at this time Freight forwarder Issues: No  issues Guardian/Conservator: None-according to MD pt is not capable of making her decisions while here.  Will look toward husband to make any decisions while here   Abuse/Neglect Physical Abuse: Denies Verbal Abuse: Denies Sexual Abuse: Denies Exploitation of patient/patient's resources: Denies Self-Neglect: Denies  Emotional Status Pt's affect, behavior adn adjustment status: Pt is motivated to improve and recover from this stroke.  She has always been independent and able to provide for herself.  Her son reports: " She is usually the one taking care of others."   Her family is a source of strength for her. Recent Psychosocial Issues: Other medical issues-that are managed Pyschiatric History: No history deferred depression screen due to eating breakfast and having speech issues.  Will reassess and have Neuro-psych intervene due to pt's young age and independence PTA Substance Abuse History: No issues  Patient / Family Perceptions, Expectations & Goals Pt/Family understanding of illness & functional limitations: Pt and son can explain her stroke and deficits, have spoken with MD and feel their questions and concerns are addressed.  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, [place] member, etc Anticipated changes in roles/activities/participation: resume Pt/family expectations/goals: Pt states; " I need to get better."  Son states; " She is so independent, it is hard to see her like this, but we are hopeful she will do well here."  [employer]: None Premorbid Home Care/DME Agencies: None Transportation available at discharge: [employer] referrals recommended: Support group (specify) ([facility])  Discharge Planning Living Arrangements: Spouse/significant other;Children Support Systems: Spouse/significant other;Children;Friends/neighbors;Church/faith community Type of  Residence: Private residence Insurance Resources: Multimedia programmer (specify) Pharmacologist) Financial Resources: Family Support;Employment  Financial Screen Referred: No Living Expenses: Lives with family Money Management: Patient;Spouse Does the patient have any problems obtaining your medications?: No Home Management: Patient, now husband will assist Patient/Family Preliminary Plans: Return home with husband and son who will come up with a plan to provide 24 hour care if this is needed.  Son is here to observe today in therapies and husband will be here later.  See how evaluations go today.  Clinical Impression Pleasant female who is motivated to do well here.  She has always been one to do for herself and others.  Son here to provide support and observe in therapies.  Husband and son to come up with A 24 hour care plan.  See how evaluations today and update with goals and targeted discharge date.  Elease Hashimoto 12/09/2013, 9:51 AM

## 2013-12-09 NOTE — Evaluation (Signed)
Speech Language Pathology Assessment and Plan  Patient Details  Name: Melissa [name] MRN: [id number] Date of Birth: May 08, 1951  SLP Diagnosis: Aphasia;Cognitive Impairments  Rehab Potential: Good ELOS: 17-21 days   Today's Date: 12/09/2013 Time: 3716-9678 Time Calculation (min): 55 min  Problem List:  Patient Active Problem List   Diagnosis Date Noted  . Thalamic hemorrhage 12/08/2013  . Cerebral hemorrhage, acute 12/02/2013  . 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 ADENOCARCINOMA, ENDOMETRIUM 08/11/2007  . HYPERTHYROIDISM 05/16/2007  . HYPERTENSION 05/16/2007  . OSTEOARTHRITIS 05/16/2007   Past Medical History:  Past Medical History  Diagnosis Date  . Hypertension   . Uterine cancer    Past Surgical History:  Past Surgical History  Procedure Laterality Date  . Replacement total knee bilateral    . Cholecystectomy    . Abdominal hysterectomy      Assessment / Plan / Recommendation Clinical Impression Melissa [name] is a 63 y.o. right-handed female with history of hypertension. Patient independent prior to admission.  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 CT and MRI imaging revealed acute hemorrhage centered in the left thalamus with extension into the left lateral ventricle no hydrocephalus. MRA of the head with no major occlusion or stenosis. Hemorrhage felt to be secondary to possible accelerated hypertension; close monitoring of blood pressure. Patient on a dysphagia 3 and thin liquid diet. Physical and occupational therapy evaluations completed with recommendations for physical medicine rehabilitation consult; patient admitted 12/08/13.  Order receive and cognitive-linguistic and bedside swallow evaluations completed.  Patient exhibits moderately severe global aphasia (expressive >receptive) with intermittent emergent awareness of errors. Expression consists of semantic paraphasias, perseveration and occasional neologisms. SLP suspects that  lethargy and impaired sustained attention also impact overall cognitive-linguistic abilities at a basic level to include safety with self-feeding and as a result requires full supervision with PO.  It is recommended that this patient receive skilled SLP services during CIR stay to maximize functional communication abilities as well as to maximize safety during basic self-care tasks.    Skilled Therapeutic Interventions          See SLE and BSE for full details  SLP Assessment  Patient will need skilled Speech Lanaguage Pathology Services during CIR admission    Recommendations  Diet Recommendations: Regular;Thin liquid Liquid Administration via: Cup;Straw Medication Administration: Whole meds with puree Supervision: Patient able to self feed;Full supervision/cueing for compensatory strategies Compensations: Slow rate;Small sips/bites Postural Changes and/or Swallow Maneuvers: Seated upright 90 degrees Oral Care Recommendations: Oral care BID Patient destination: Home Follow up Recommendations: Outpatient SLP;24 hour supervision/assistance Equipment Recommended: None recommended by SLP    SLP Frequency 5 out of 7 days   SLP Treatment/Interventions Cueing hierarchy;Cognitive remediation/compensation;Environmental controls;Functional tasks;Internal/external aids;Patient/family education;Multimodal communication approach;Speech/Language facilitation;Therapeutic Activities    Pain Pain Assessment Pain Assessment: No/denies pain Prior Functioning Cognitive/Linguistic Baseline: Within functional limits Type of Home: House  Lives With: Spouse Available Help at Discharge: Family;Available 24 hours/day Education: retired Therapist, sports Vocation: Retired  Industrial/product designer Term Goals: Week 1: SLP Short Term Goal 1 (Week 1): Patient will name basic ADL objects with Mod assist semantic and phonemic cues SLP Short Term Goal 2 (Week 1): Patient will match word to object in a field of 3 with Mod assist clinician cues SLP  Short Term Goal 3 (Week 1): Patient will express basic wants and needs with Max cues to utilize multi-modal communcation strategies SLP Short Term Goal 4 (Week 1): Patient will follow 2-step directions with Mod vebral and visual cues  See FIM for current functional status Refer to Care Plan for Long Term Goals  Recommendations for other services: None  Discharge Criteria: Patient will be discharged from SLP if patient refuses treatment 3 consecutive times without medical reason, if treatment goals not met, if there is a change in medical status, if patient makes no progress towards goals or if patient is discharged from hospital.  The above assessment, treatment plan, treatment alternatives and goals were discussed and mutually agreed upon: by patient and by family  Carmelia Roller., CCC-SLP [phone]  Gregory 12/09/2013, 4:35 PM

## 2013-12-09 NOTE — Progress Notes (Signed)
Subjective/Complaints: 63 y.o. right-handed female with history of hypertension. 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 CT and MRI imaging revealed acute hemorrhage centered in the left thalamus with extension into the left lateral ventricle no hydrocephalus. MRA of the head with no major occlusion or stenosis. Hemorrhage felt to be secondary to possible accelerated hypertension  Review of Systems - cannot obtain secondary to severe aphasia  Objective: Vital Signs: Blood pressure 145/83, pulse 71, temperature 98.6 F (37 C), temperature source Oral, resp. rate 18, weight 131.5 kg (289 lb 14.5 oz), SpO2 93.00%. No results found. Results for orders placed during the hospital encounter of 12/08/13 (from the past 72 hour(s))  CBC WITH DIFFERENTIAL     Status: Abnormal   Collection Time    12/09/13  6:27 AM      Result Value Ref Range   WBC 10.6 (*) 4.0 - 10.5 K/uL   RBC 4.09  3.87 - 5.11 MIL/uL   Hemoglobin 13.1  12.0 - 15.0 g/dL   HCT 37.9  36.0 - 46.0 %   MCV 92.7  78.0 - 100.0 fL   MCH 32.0  26.0 - 34.0 pg   MCHC 34.6  30.0 - 36.0 g/dL   RDW 12.8  11.5 - 15.5 %   Platelets 159  150 - 400 K/uL   Neutrophils Relative % 79 (*) 43 - 77 %   Neutro Abs 8.4 (*) 1.7 - 7.7 K/uL   Lymphocytes Relative 9 (*) 12 - 46 %   Lymphs Abs 1.0  0.7 - 4.0 K/uL   Monocytes Relative 9  3 - 12 %   Monocytes Absolute 0.9  0.1 - 1.0 K/uL   Eosinophils Relative 3  0 - 5 %   Eosinophils Absolute 0.3  0.0 - 0.7 K/uL   Basophils Relative 0  0 - 1 %   Basophils Absolute 0.0  0.0 - 0.1 K/uL  COMPREHENSIVE METABOLIC PANEL     Status: Abnormal   Collection Time    12/09/13  6:27 AM      Result Value Ref Range   Sodium 137  137 - 147 mEq/L   Potassium 3.9  3.7 - 5.3 mEq/L   Chloride 97  96 - 112 mEq/L   CO2 32  19 - 32 mEq/L   Glucose, Bld 139 (*) 70 - 99 mg/dL   BUN 12  6 - 23 mg/dL   Creatinine,  Ser 0.82  0.50 - 1.10 mg/dL   Calcium 9.4  8.4 - 10.5 mg/dL   Total Protein 7.2  6.0 - 8.3 g/dL   Albumin 3.1 (*) 3.5 - 5.2 g/dL   AST 15  0 - 37 U/L   ALT 17  0 - 35 U/L   Alkaline Phosphatase 94  39 - 117 U/L   Total Bilirubin 1.7 (*) 0.3 - 1.2 mg/dL   GFR calc non Af Amer 75 (*) >90 mL/min   GFR calc Af Amer 86 (*) >90 mL/min   Comment: (NOTE)     The eGFR has been calculated using the CKD EPI equation.     This calculation has not been validated in all clinical situations.     eGFR's persistently <90 mL/min signify possible Chronic Kidney     Disease.     HEENT: normal Cardio: RRR and no murmur Resp: CTA B/L and unlabored GI: BS positive and NT,ND Extremity:  Pulses positive and  No Edema Skin:   Intact Neuro: Flat, Cranial Nerve Abnormalities R central VII, Abnormal Sensory Unable to assess secondary to aphasia, Abnormal Motor 4/5 R delt bi, tri, grip, HF, KE, ADF and Abnormal FMC Ataxic/ dec FMC Musc/Skel:  Normal Gen NAD   Assessment/Plan: 1. Functional deficits secondary to Left Thalamic ICH which require 3+ hours per day of interdisciplinary therapy in a comprehensive inpatient rehab setting. Physiatrist is providing close team supervision and 24 hour management of active medical problems listed below. Physiatrist and rehab team continue to assess barriers to discharge/monitor patient progress toward functional and medical goals. FIM: FIM - Bathing Bathing Steps Patient Completed: Chest;Right Arm;Left Arm;Abdomen;Right upper leg;Left upper leg Bathing: 3: Mod-Patient completes 5-7 70f10 parts or 50-74%  FIM - Upper Body Dressing/Undressing Upper body dressing/undressing steps patient completed: Thread/unthread left sleeve of pullover shirt/dress;Put head through opening of pull over shirt/dress;Pull shirt over trunk Upper body dressing/undressing: 2: Max-Patient completed 25-49% of tasks FIM - Lower Body Dressing/Undressing Lower body dressing/undressing: 1:  Total-Patient completed less than 25% of tasks     FIM - TRadio producerDevices: Bedside commode Toilet Transfers: 1-To toilet/BSC: Total A (helper does all/Pt. < 25%);2-From toilet/BSC: Max A (lift and lower assist)  FIM - BEngineer, siteAssistive Devices: Arm rests Bed/Chair Transfer: 3: Supine > Sit: Mod A (lifting assist/Pt. 50-74%/lift 2 legs;2: Chair or W/C > Bed: Max A (lift and lower assist);3: Sit > Supine: Mod A (lifting assist/Pt. 50-74%/lift 2 legs);1: Bed > Chair or W/C: Total A (helper does all/Pt. < 25%)  FIM - Locomotion: Wheelchair Distance: 25 Locomotion: Wheelchair: 1: Travels less than 50 ft with maximal assistance (Pt: 25 - 49%) FIM - Locomotion: Ambulation Locomotion: Ambulation: 0: Activity did not occur (Not assessed; unsafe at the time)  Comprehension Comprehension Mode: Auditory Comprehension: 2-Understands basic 25 - 49% of the time/requires cueing 51 - 75% of the time  Expression Expression Mode: Verbal Expression: 1-Expresses basis less than 25% of the time/requires cueing greater than 75% of the time.  Social Interaction Social Interaction: 2-Interacts appropriately 25 - 49% of time - Needs frequent redirection.  Problem Solving Problem Solving: 1-Solves basic less than 25% of the time - needs direction nearly all the time or does not effectively solve problems and may need a restraint for safety  Memory Memory: 1-Recognizes or recalls less than 25% of the time/requires cueing greater than 75% of the time  Medical Problem List and Plan:  1. 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 DVT Prophylaxis/Anticoagulation: SCDs. Monitor for any signs of DVT  3. Pain Management: Tylenol as needed. Monitor with increased mobility  4. Neuropsych: This patient is not yet capable of making decisions on her own behalf.  5. Hypertension.  -remains poorly controlled  -increase Norvasc  to 525mBID  -continue Avapro 300 mg daily, hydrochlorothiazide 12.5 mg daily, Lopressor 100 mg twice a day. Monitor with increased mobility   LOS (Days) 1 A FACE TO FACE EVALUATION WAS PERFORMED  Sindee Stucker E 12/09/2013, 4:44 PM

## 2013-12-09 NOTE — Progress Notes (Signed)
Social Work Patient ID: Melissa [name], female   DOB: 10/13/1950, 63 y.o.   MRN: [id number] Met with pt and son to inform team conference goals-supervision/min level and discharge 3/31 target.  Both are pleased and are hopeful pt will do well here. Pt becomes frustrated with her speech issues, but hope it will come in time.  Will work discharge needs, pt would benefit from Neuro-psych when able to Communicate better.

## 2013-12-10 ENCOUNTER — Inpatient Hospital Stay (HOSPITAL_COMMUNITY): Payer: 59 | Admitting: Physical Therapy

## 2013-12-10 ENCOUNTER — Inpatient Hospital Stay (HOSPITAL_COMMUNITY): Payer: 59 | Admitting: Occupational Therapy

## 2013-12-10 ENCOUNTER — Inpatient Hospital Stay (HOSPITAL_COMMUNITY): Payer: 59

## 2013-12-10 ENCOUNTER — Inpatient Hospital Stay (HOSPITAL_COMMUNITY): Payer: 59 | Admitting: Speech Pathology

## 2013-12-10 DIAGNOSIS — R4701 Aphasia: Secondary | ICD-10-CM

## 2013-12-10 DIAGNOSIS — I619 Nontraumatic intracerebral hemorrhage, unspecified: Secondary | ICD-10-CM

## 2013-12-10 DIAGNOSIS — I10 Essential (primary) hypertension: Secondary | ICD-10-CM

## 2013-12-10 DIAGNOSIS — R1310 Dysphagia, unspecified: Secondary | ICD-10-CM

## 2013-12-10 LAB — URINE MICROSCOPIC-ADD ON

## 2013-12-10 LAB — URINALYSIS, ROUTINE W REFLEX MICROSCOPIC
Glucose, UA: NEGATIVE mg/dL
Hgb urine dipstick: NEGATIVE
KETONES UR: 15 mg/dL — AB
NITRITE: NEGATIVE
PH: 6 (ref 5.0–8.0)
Protein, ur: 100 mg/dL — AB
Specific Gravity, Urine: 1.025 (ref 1.005–1.030)
Urobilinogen, UA: 2 mg/dL — ABNORMAL HIGH (ref 0.0–1.0)

## 2013-12-10 NOTE — Progress Notes (Signed)
Subjective/Complaints: 63 y.o. right-handed female with history of hypertension. 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 CT and MRI imaging revealed acute hemorrhage centered in the left thalamus with extension into the left lateral ventricle no hydrocephalus. MRA of the head with no major occlusion or stenosis. Hemorrhage felt to be secondary to possible accelerated hypertension  Review of Systems - cannot obtain secondary to severe aphasia  Objective: Vital Signs: Blood pressure 171/75, pulse 71, temperature 97.8 F (36.6 C), temperature source Oral, resp. rate 17, weight 131.5 kg (289 lb 14.5 oz), SpO2 94.00%. No results found. Results for orders placed during the hospital encounter of 12/08/13 (from the past 72 hour(s))  CBC WITH DIFFERENTIAL     Status: Abnormal   Collection Time    12/09/13  6:27 AM      Result Value Ref Range   WBC 10.6 (*) 4.0 - 10.5 K/uL   RBC 4.09  3.87 - 5.11 MIL/uL   Hemoglobin 13.1  12.0 - 15.0 g/dL   HCT 37.9  36.0 - 46.0 %   MCV 92.7  78.0 - 100.0 fL   MCH 32.0  26.0 - 34.0 pg   MCHC 34.6  30.0 - 36.0 g/dL   RDW 12.8  11.5 - 15.5 %   Platelets 159  150 - 400 K/uL   Neutrophils Relative % 79 (*) 43 - 77 %   Neutro Abs 8.4 (*) 1.7 - 7.7 K/uL   Lymphocytes Relative 9 (*) 12 - 46 %   Lymphs Abs 1.0  0.7 - 4.0 K/uL   Monocytes Relative 9  3 - 12 %   Monocytes Absolute 0.9  0.1 - 1.0 K/uL   Eosinophils Relative 3  0 - 5 %   Eosinophils Absolute 0.3  0.0 - 0.7 K/uL   Basophils Relative 0  0 - 1 %   Basophils Absolute 0.0  0.0 - 0.1 K/uL  COMPREHENSIVE METABOLIC PANEL     Status: Abnormal   Collection Time    12/09/13  6:27 AM      Result Value Ref Range   Sodium 137  137 - 147 mEq/L   Potassium 3.9  3.7 - 5.3 mEq/L   Chloride 97  96 - 112 mEq/L   CO2 32  19 - 32 mEq/L   Glucose, Bld 139 (*) 70 - 99 mg/dL   BUN 12  6 - 23 mg/dL   Creatinine, Ser 0.82  0.50 - 1.10 mg/dL   Calcium 9.4  8.4 - 10.5 mg/dL   Total Protein 7.2  6.0 - 8.3 g/dL   Albumin 3.1 (*) 3.5 - 5.2 g/dL   AST 15  0 - 37 U/L   ALT 17  0 - 35 U/L   Alkaline Phosphatase 94  39 - 117 U/L   Total Bilirubin 1.7 (*) 0.3 - 1.2 mg/dL   GFR calc non Af Amer 75 (*) >90 mL/min   GFR calc Af Amer 86 (*) >90 mL/min   Comment: (NOTE)     The eGFR has been calculated using the CKD EPI equation.     This calculation has not been validated in all clinical situations.     eGFR's persistently <90 mL/min signify possible Chronic Kidney     Disease.     HEENT: normal Cardio: RRR and no murmur Resp: CTA B/L and unlabored GI: BS positive and NT,ND Extremity:  Pulses positive and No  Edema Skin:   Intact Neuro: Flat, Cranial Nerve Abnormalities R central VII, Abnormal Sensory Unable to assess secondary to aphasia, Abnormal Motor 4/5 R delt bi, tri, grip, HF, KE, ADF and Abnormal FMC Ataxic/ dec FMC Musc/Skel:  Normal Gen NAD   Assessment/Plan: 1. Functional deficits secondary to Left Thalamic ICH which require 3+ hours per day of interdisciplinary therapy in a comprehensive inpatient rehab setting. Physiatrist is providing close team supervision and 24 hour management of active medical problems listed below. Physiatrist and rehab team continue to assess barriers to discharge/monitor patient progress toward functional and medical goals. FIM: FIM - Bathing Bathing Steps Patient Completed: Chest;Right Arm;Left Arm;Abdomen;Right upper leg;Left upper leg Bathing: 3: Mod-Patient completes 5-7 63f10 parts or 50-74%  FIM - Upper Body Dressing/Undressing Upper body dressing/undressing steps patient completed: Thread/unthread left sleeve of pullover shirt/dress;Put head through opening of pull over shirt/dress;Pull shirt over trunk Upper body dressing/undressing: 2: Max-Patient completed 25-49% of tasks FIM - Lower Body Dressing/Undressing Lower body dressing/undressing:  1: Total-Patient completed less than 25% of tasks     FIM - TRadio producerDevices: Bedside commode Toilet Transfers: 1-To toilet/BSC: Total A (helper does all/Pt. < 25%);2-From toilet/BSC: Max A (lift and lower assist)  FIM - BEngineer, siteAssistive Devices: Arm rests Bed/Chair Transfer: 3: Supine > Sit: Mod A (lifting assist/Pt. 50-74%/lift 2 legs;2: Chair or W/C > Bed: Max A (lift and lower assist);3: Sit > Supine: Mod A (lifting assist/Pt. 50-74%/lift 2 legs);1: Bed > Chair or W/C: Total A (helper does all/Pt. < 25%)  FIM - Locomotion: Wheelchair Distance: 25 Locomotion: Wheelchair: 1: Travels less than 50 ft with maximal assistance (Pt: 25 - 49%) FIM - Locomotion: Ambulation Locomotion: Ambulation: 0: Activity did not occur (Not assessed; unsafe at the time)  Comprehension Comprehension Mode: Auditory Comprehension: 2-Understands basic 25 - 49% of the time/requires cueing 51 - 75% of the time  Expression Expression Mode: Verbal Expression: 1-Expresses basis less than 25% of the time/requires cueing greater than 75% of the time.  Social Interaction Social Interaction: 2-Interacts appropriately 25 - 49% of time - Needs frequent redirection.  Problem Solving Problem Solving: 1-Solves basic less than 25% of the time - needs direction nearly all the time or does not effectively solve problems and may need a restraint for safety  Memory Memory: 1-Recognizes or recalls less than 25% of the time/requires cueing greater than 75% of the time  Medical Problem List and Plan:  1. 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 DVT Prophylaxis/Anticoagulation: SCDs. Monitor for any signs of DVT  3. Pain Management: Tylenol as needed. Monitor with increased mobility  4. Neuropsych: This patient is not yet capable of making decisions on her own behalf.  5. Hypertension.  -remains poorly controlled  -increase  Norvasc to 52mBID  - Avapro 300 mg daily, hydrochlorothiazide 12.5 mg daily, Lopressor 100 mg twice a day. Monitor with increased mobility, titrate as needed   LOS (Days) 2 A FACE TO FACE EVALUATION WAS PERFORMED  Desirey Keahey E 12/10/2013, 8:00 AM

## 2013-12-10 NOTE — Progress Notes (Signed)
Occupational Therapy Session Note  Patient Details  Name: Melissa [name] MRN: [id number] Date of Birth: 1951/05/24  Today's Date: 12/10/2013 Time: 4098-1191 Time Calculation (min): 50 min  Short Term Goals: Week 1:  OT Short Term Goal 1 (Week 1): Pt will complete toilet transfer with mod assist OT Short Term Goal 2 (Week 1): Pt will verbally identify 50% of clothing items by name  OT Short Term Goal 3 (Week 1): Pt will complete LB dressing with max assist  OT Short Term Goal 4 (Week 1): Pt will complete shower transfer with mod assist  Skilled Therapeutic Interventions/Progress Updates:   Patient seen this am to address neuromuscular re-education.  Patient sits with posterior rotation of pelvis, and flexed trunk.  Patient has the ability to activate trunk extension, and beginning of pelvic roll forward, although lacks the ability to sustain this activation for more than 1-2 seconds.  Will need to further enhance this ability to increase her independence with transition from sit to stand as needed for functional transfers and lower body dressing tasks.  Patient with pattern of breath holding with movements involving effort / right upper extremity.  Patient needed frequent cues, gestural / visual more effective than verbal cueing to breathe through moments of exertion.   Addressed weight bearing through extended right arm, with weight biased toward right side while seated.  Emphasis here on increasing ability to sustain controlled elbow extension.   Followed with active assisted guided movement of right upper extremity.  Patient apraxic, and did best with guided movement to limit atypical compensatory movements.   Patient has good strength in right lower extremity during standing, and is limited by motor impersistence.  She did well with physical prompt to activate hip and knee extension during standing.  Patient fearful of movement in standing, breathing pattern changes, and patient appears more  anxious.  Patient responded well to timed based activity, and rhythmic counting to define end point of stand task.  Worked with patient to improve her ability to activate all extremities simultaneously, e.g. Standing and pressing up a weighted all with both hands.  Finished session with fine motor activities in right hand, grasp, release, and beginning of in-hand manipulation of objects.  Husband Joneen Caraway present throughout this session.  Therapy Documentation Precautions:  Precautions Precautions: Fall Precaution Comments: Impaired motor planning RUE/LE. 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); yes/no questions and demonstration most effective Restrictions Weight Bearing Restrictions: No   Vital Signs: Patient reports feeling dizzy. BP seated immediately after standing 163/88  Pain:  Denies pain  See FIM for current functional status  Therapy/Group: Individual Therapy  Mariah Milling 12/10/2013, 11:40 AM

## 2013-12-10 NOTE — IPOC Note (Signed)
Overall Plan of Care [facility]) Patient Details Name: BRYNLYNN WALKO MRN: [id number] DOB: 1951/03/28  Admitting Diagnosis: L THALAMIC HEMORRHAGE  Hospital Problems: Active Problems:   Thalamic hemorrhage     Functional Problem List: Nursing Bladder;Bowel;Medication Management;Nutrition;Safety;Skin Integrity  PT Balance;Endurance;Motor;Safety;Sensory;Other (comment) (Cognition)  OT Balance;Cognition;Endurance;Motor;Perception;Safety;Sensory;Vision  SLP Cognition;Linguistic;Safety  TR         Basic ADL's: OT Grooming;Eating;Bathing;Dressing;Toileting     Advanced  ADL's: OT       Transfers: PT Bed Mobility;Bed to Chair;Car;Furniture  OT Toilet;Tub/Shower     Locomotion: PT Ambulation;Wheelchair Mobility;Stairs     Additional Impairments: OT Fuctional Use of Upper Extremity  SLP Communication;Social Cognition comprehension;expression Social Interaction;Problem Solving;Memory;Attention;Awareness  TR      Anticipated Outcomes Item Anticipated Outcome  Self Feeding supervision  Swallowing  Supervision due to cognitive-linguistic deficits   Basic self-care  supervision  Toileting  supervison   Bathroom Transfers supervision  Bowel/Bladder  Continent of bowel and bladder with minimal assist  Transfers  Supervision to Min A  Locomotion  Min A to Mod A  Communication  Mod assist   Cognition  Min assist   Pain  3 or less out of 10  Safety/Judgment  Minimal assist   Therapy Plan: PT Intensity: Minimum of 1-2 x/day ,45 to 90 minutes PT Frequency: 5 out of 7 days PT Duration Estimated Length of Stay: 17-20 OT Intensity: Minimum of 1-2 x/day, 45 to 90 minutes OT Frequency: 5 out of 7 days OT Duration/Estimated Length of Stay: 17-21 days SLP Intensity: Minumum of 1-2 x/day, 30 to 90 minutes SLP Frequency: 5 out of 7 days SLP Duration/Estimated Length of Stay: 17-21 days       Team Interventions: Nursing Interventions Patient/Family Education;Bowel  Management;Bladder Management;Disease Management/Prevention;Medication Management;Skin Care/Wound Management;Cognitive Remediation/Compensation;Dysphagia/Aspiration Precaution Training  PT interventions Ambulation/gait training;Balance/vestibular training;Cognitive remediation/compensation;Discharge planning;Disease management/prevention;Functional mobility training;Pain management;Patient/family education;Neuromuscular re-education;DME/adaptive equipment instruction;Wheelchair propulsion/positioning;UE/LE Coordination activities;UE/LE Strength taining/ROM;Stair training;Splinting/orthotics;Therapeutic Exercise;Therapeutic Activities  OT Interventions Balance/vestibular training;Cognitive remediation/compensation;Discharge planning;DME/adaptive equipment instruction;Functional mobility training;Patient/family education;Psychosocial support;Self Care/advanced ADL retraining;Therapeutic Activities;Therapeutic Exercise;UE/LE Strength taining/ROM;UE/LE Coordination activities;Visual/perceptual remediation/compensation  SLP Interventions Cueing hierarchy;Cognitive remediation/compensation;Environmental controls;Functional tasks;Internal/external aids;Patient/family education;Multimodal communication approach;Speech/Language facilitation;Therapeutic Activities  TR Interventions    SW/CM Interventions Discharge Planning;Psychosocial Support;Patient/Family Education    Team Discharge Planning: Destination: PT-  ,OT- Home , SLP-Home Projected Follow-up: PT-24 hour supervision/assistance;Home health PT, OT-  Home health OT, SLP-Outpatient SLP;24 hour supervision/assistance Projected Equipment Needs: PT-To be determined, OT- 3 in 1 bedside comode;Tub/shower seat, SLP-None recommended by SLP Equipment Details: PT-Pt does not own any personal assistive devices., OT-  Patient/family involved in discharge planning: PT- Patient;Family member/caregiver,  OT-Patient, SLP-Patient;Family member/caregiver  MD ELOS:  12-17 Medical Rehab Prognosis:  Good Assessment: 63 y.o. right-handed female with history of hypertension. 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 CT and MRI imaging revealed acute hemorrhage centered in the left thalamus with extension into the left lateral ventricle no hydrocephalus. MRA of the head with no major occlusion or stenosis. Hemorrhage felt to be secondary to possible accelerated hypertension. TPA was not administered secondary to hemorrhage. Neurology services consulted with conservative care. Currently on a dysphagia 3 and liquid diet  Now requiring 24/7 Rehab RN,MD, as well as CIR level PT, OT and SLP.  Treatment team will focus on ADLs and mobility with goals set at sup/min    See Team Conference Notes for weekly updates to the plan of care

## 2013-12-10 NOTE — Progress Notes (Signed)
Speech Language Pathology Daily Session Note  Patient Details  Name: Melissa [name] MRN: [id number] Date of Birth: [birth year]  Today's Date: 12/10/2013 Time: 7616-0737 Time Calculation (min): 35 min  Short Term Goals: Week 1: SLP Short Term Goal 1 (Week 1): Patient will name basic ADL objects with Mod assist semantic and phonemic cues SLP Short Term Goal 2 (Week 1): Patient will match word to object in a field of 3 with Mod assist clinician cues SLP Short Term Goal 3 (Week 1): Patient will express basic wants and needs with Max cues to utilize multi-modal communcation strategies SLP Short Term Goal 4 (Week 1): Patient will follow 2-step directions with Mod vebral and visual cues  Skilled Therapeutic Interventions: Treatment session focused on diagnostic treatment of effective cuing during naming tasks and multi-modal communication tools.  SLP facilitated session with structured object naming task; patient 100% accurate with written cues or with semantic and phonemic cues.  When cues were faded to semantic level only patient 50% accurate and difficult to redirect with repetition due to verbal perseveration.  Automatic verbal sequences also revealed perseverative errors.  Per husband's request pictures were assessed to determine accuracy as a means to support expression of wants and needs; patient 70% accurate from a field of three; nurse tech notified.  Continue with current plan of care.      FIM:  Comprehension Comprehension Mode: Auditory Comprehension: 3-Understands basic 50 - 74% of the time/requires cueing 25 - 50%  of the time Expression Expression Mode: Verbal Expression: 2-Expresses basic 25 - 49% of the time/requires cueing 50 - 75% of the time. Uses single words/gestures. Social Interaction Social Interaction: 2-Interacts appropriately 25 - 49% of time - Needs frequent redirection. Problem Solving Problem Solving: 1-Solves basic less than 25% of the time - needs direction  nearly all the time or does not effectively solve problems and may need a restraint for safety Memory Memory: 2-Recognizes or recalls 25 - 49% of the time/requires cueing 51 - 75% of the time FIM - Eating Eating Activity: 5: Set-up assist for open containers  Pain Pain Assessment Pain Assessment: No/denies pain  Therapy/Group: Individual Therapy  Carmelia Roller., [place] [phone]  [place] 12/10/2013, 5:09 PM

## 2013-12-10 NOTE — Progress Notes (Signed)
Occupational Therapy Session Note  Patient Details  Name: Melissa [name] MRN: [id number] Date of Birth: 10-31-1950  Today's Date: 12/10/2013 Time: 0930-1030 Time Calculation (min): 60 min  Short Term Goals: Week 1:  OT Short Term Goal 1 (Week 1): Pt will complete toilet transfer with mod assist OT Short Term Goal 2 (Week 1): Pt will verbally identify 50% of clothing items by name  OT Short Term Goal 3 (Week 1): Pt will complete LB dressing with max assist  OT Short Term Goal 4 (Week 1): Pt will complete shower transfer with mod assist  Skilled Therapeutic Interventions/Progress Updates:    Pt seen for ADL retraining with focus on sit<>stand, functional transfers, anterior weight shift, and word finding. Pt received supine in bed. Completed bathing and dressing sitting EOB with supervision for dynamic sitting balance and 1 occasion of min assist. Pt required cues for breathing technique during LB bathing and dressing as tends to hold breath. Therapist providing visual demonstration. Focused on anterior weight shift during LB bathing in dressing in preparation for sit>stand. Pt fearful of standing so therapist provided RW, decreasing some fear in pt. Pt progressed to completing sit<>stand x2 with mod assist and cues for anterior weight shift and manual facilitation at trunk. During dressing, encouraged pt to identify colors and name of clothing. Pt successful with colors approx 70% of time and self-correcting without cues. Completed stand pivot transfers bed>w/c and w/c<>toilet with min-mod assist and increased time. Pt with carryover of hand placement after min cues initially. Completed oral care at sink. Pt utilized bil hands for applying toothpaste and cues for using L hand some as pt demonstrating difficulty with use of R hand to complete task. Pt required max gestural and visual cues for termination of task to rinse toothbrush and turn water off. At end of session pt left sitting in w/c with all  needs in reach and husband present.   Therapy Documentation Precautions:  Precautions Precautions: Fall Precaution Comments: Impaired motor planning RUE/LE. 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   See FIM for current functional status  Therapy/Group: Individual Therapy  Duayne Cal 12/10/2013, 12:15 PM

## 2013-12-10 NOTE — Progress Notes (Signed)
Physical Therapy Session Note  Patient Details  Name: Melissa [name] MRN: [id number] Date of Birth: [birth year]  Today's Date: 12/11/2013 Time:  5638-7564 46 min  Short Term Goals: Week 1:  PT Short Term Goal 1 (Week 1): Pt will consistently perform supine <>sit with min A with HOB flat using bed rail. PT Short Term Goal 2 (Week 1): Pt will consistently perform bed<>chair transfer with mod A. PT Short Term Goal 3 (Week 1): Pt will perform w/c mobility x50' in controlled environment with mod A, mod cueing. PT Short Term Goal 4 (Week 1): Pt will perform gait x10' in controlled environment with Total A using LRAD. PT Short Term Goal 5 (Week 1): Pt to negotiate 3 steps with bilat rails requiring +2Total A.  Skilled Therapeutic Interventions/Progress Updates:    2:1. Pt received seated in w/c with husband present. Pt reports feeling "very tired," but agreeable to PT. Session focused on initiation of gait, transfers with rolling walker. Pt performed stand pivot from w/c<>bed with rolling walker, mod A, manual facilitation of bilat weight shifting during pivoting; multimodal cueing for safe hand placement.  Performed w/c mobility x75' in controlled environment, x50' in controlled and home environments with bilat LE's and mod A; initially required manual facilitation of reciprocal flexion/extension of bilat LE's. Tactile cueing at R knee to increase weight bearing. Subtle R-sided inattention noted during w/c mobility trials.  Performed sit<>stand from w/c with rolling walker with max A. Gait x46' in controlled environment with rolling walker and mod A (+2A for w/c follow secondary to pt tendency to sit unexpectedly). Verbal cueing ("Bring your left shoulder closer to the wall") effective in addressing lateral trunk lean to R during gait. Following gait trial, attempted sit>stand from w/c after seated rest break >5 minutes; however, pt unable to achieve stand, then reports inability to continue  secondary to fatigue.   Transported pt to room, where pt performed stand-pivot from bed>chair with max A (for sit>stand) and multimodal cueing for setup, technique. Pt performed sit>supine with mod A for bilat LE management. Overall, session significantly limited by decreased activity tolerance. During rest breaks, pt educated husband on techniques for increasing pt attention to R side. Husband verbalized understanding. Therapist departed with pt semi-reclined in bed with 3 bed rails up, bed alarm on, and husband present.   Therapy Documentation Precautions:  Precautions Precautions: Fall Precaution Comments: Impaired motor planning RUE/LE. 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  Pain: Pt reports no pain during PT session.  See FIM for current functional status  Therapy/Group: Individual Therapy  Syrai Gladwin, Malva Cogan 12/11/2013, 7:40 AM

## 2013-12-11 ENCOUNTER — Inpatient Hospital Stay (HOSPITAL_COMMUNITY): Payer: 59

## 2013-12-11 ENCOUNTER — Inpatient Hospital Stay (HOSPITAL_COMMUNITY): Payer: 59 | Admitting: Physical Therapy

## 2013-12-11 MED ORDER — AMLODIPINE BESYLATE 10 MG PO TABS
10.0000 mg | ORAL_TABLET | Freq: Every day | ORAL | Status: DC
  Administered 2013-12-13 – 2013-12-24 (×10): 10 mg via ORAL
  Filled 2013-12-11 (×16): qty 1

## 2013-12-11 NOTE — Progress Notes (Signed)
Occupational Therapy Session Note  Patient Details  Name: Melissa [name] MRN: [id number] Date of Birth: Oct 18, 1950  Today's Date: 12/11/2013 Time: 0900-1000 Time Calculation (min): 60 min  Short Term Goals: Week 1:  OT Short Term Goal 1 (Week 1): Pt will complete toilet transfer with mod assist OT Short Term Goal 2 (Week 1): Pt will verbally identify 50% of clothing items by name  OT Short Term Goal 3 (Week 1): Pt will complete LB dressing with max assist  OT Short Term Goal 4 (Week 1): Pt will complete shower transfer with mod assist  Skilled Therapeutic Interventions/Progress Updates:    Pt seen for ADL retraining with focus on activity tolerance, anterior weight shift, sit<>stand, and functional use of RUE. Pt received supine in bed and eager for shower. Ambulated to walk-in shower with mod assist for sit>stand from bed and min assist with RW and min cues for attention to right. Completed bathing with mod assist and mod cues for sequencing. Completed dressing in w/c with focus on anterior weight shift for LB dressing and increasing independence with sit>stand. Pt required max visual, gestural, and verbal cues for breathing technique as she tends to hold breath with anterior weight shift and with sit>stand. Provided HOH assist for use of RUE to sustain grasp of comb and reach hair. Pt limited by grip strength to 3-4 sec during this task. Completed oral care with min cues for sequencing of task. Pt used R hand to twist off top toothpaste cap. At end of session pt left sitting in w/c with QRB donned and all needs in reach. Son present at end of therapy session.   Therapy Documentation Precautions:  Precautions Precautions: Fall Precaution Comments: Impaired motor planning RUE/LE. 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   See FIM for current functional status  Therapy/Group: Individual Therapy  Duayne Cal 12/11/2013, 10:19 AM

## 2013-12-11 NOTE — Progress Notes (Addendum)
Physical Therapy Session Note  Patient Details  Name: Melissa [name] MRN: [id number] Date of Birth: [birth year]  Today's Date: 12/11/2013 Time: 1115-1205 and 2706-2376 Time Calculation (min): 50 min and 48 min  Short Term Goals: Week 1:  PT Short Term Goal 1 (Week 1): Pt will consistently perform supine <>sit with min A with HOB flat using bed rail. PT Short Term Goal 2 (Week 1): Pt will consistently perform bed<>chair transfer with mod A. PT Short Term Goal 3 (Week 1): Pt will perform w/c mobility x50' in controlled environment with mod A, mod cueing. PT Short Term Goal 4 (Week 1): Pt will perform gait x10' in controlled environment with Total A using LRAD. PT Short Term Goal 5 (Week 1): Pt to negotiate 3 steps with bilat rails requiring +2Total A.  Skilled Therapeutic Interventions/Progress Updates:    Treatment Session 1: Pt received seated in w/c with son present; agreeable to session. Performed w/c mobility x110' in controlled and home environments with bilat LE's for initial 2', with bilat UE's/LE's for final 19' requiring min A, increased time, and demonstration cueing initially for technique.  Negotiated 3 (4") stairs with bilat rails, forward-facing with step-to pattern and +2A, mod multimodal cueing for technique, sequencing, and to ensure R heel was completely on step prior to continuing descent. In rehab apartment, performed stand pivot from w/c<>bed with rolling walker; demonstration/verbal cueing for setup, technique; manual facilitation of bilat weight shifting while pivoting. Blocked practice of supine>sit on apartment bed with focus on supine>R side lying, then R side lying to sit. Pt with effective within-session carryover.  Performed w/c mobility x50' in controlled and home environments with bilat UE/LE's with mod multimodal cueing for R-sided obstacle negotiation. Therapist transported pt remainder of distance to pt room, where pt was left semi-reclined in bed (per pt  request) with 3 bed rails up, bed alarm on, son present, and all needs within reach. Therapist educated son on importance of raising HOB to full sitting position prior to pt eating/drinking. Son verbalized understanding.  Treatment Session 2: Pt received seated in w/c with son, nurse techs present to transfer pt to toilet, per pt request. Pt performed toilet transfer with rolling walker and mod A. Static/dynamic sitting balance on toilet x5 minutes with SBA, single UE support at grab bar. Pt continent of bladder; nursing notified.  Addendum: While washing hands, pt attempted to utilize toothpaste as soap. After therapist pointed to soap dispenser, pt attempted to wash mouth with soap.  Sit>stand from w/c>rolling walker with mod A. Performed gait x75' in controlled environment with rolling walker and mod A; w/c follow not necessary due to decreased impulsivity with stand>sit. Midline orientation improved during standing/gait as compared with previous session. Pt noted to be breathing infrequently during gait. PT explained, demonstrated diaphragmatic breathing. In seated, provided tactile cueing (by placing pt's hands on chest and stomach) to illustrate concept. Pt required frequent reinforcement throughout session. In future sessions, will progress to paced breathing with functional mobility.  Pt performed w/c mobility x115' (single rest break) in controlled environment with bilat LE's, min A for R-sided obstacle negotiation; multimodal cueing for compensatory strategies for inattention to RUE, to R side of surroundings. Therapist educated pt's son on subtle to min cueing for R-sided attention during mobility. Son with effective return demonstration. Returned to pt room, where pt performed stand pivot transfer from w/c>bed with rolling walker and mod A, manual facilitation of anterior weight shift and timing of hip extension with standing. Supine>sit with supervision,  cueing for attention to RLE. Pt left  semi-reclined in bed with 3 bed rails up, bed alarm on, son present, and all needs within reach  Therapy Documentation Precautions:  Precautions Precautions: Fall Precaution Comments: Impaired motor planning RUE/LE. 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

## 2013-12-11 NOTE — Progress Notes (Signed)
Speech Language Pathology Daily Session Note  Patient Details  Name: Melissa [name] MRN: [id number] Date of Birth: 1950-10-11  Today's Date: 12/11/2013 Time: 1002-1044 Time Calculation (min): 42 min  Short Term Goals: Week 1: SLP Short Term Goal 1 (Week 1): Patient will name basic ADL objects with Mod assist semantic and phonemic cues SLP Short Term Goal 2 (Week 1): Patient will match word to object in a field of 3 with Mod assist clinician cues SLP Short Term Goal 3 (Week 1): Patient will express basic wants and needs with Max cues to utilize multi-modal communcation strategies SLP Short Term Goal 4 (Week 1): Patient will follow 2-step directions with Mod vebral and visual cues  Skilled Therapeutic Interventions: Treatment session focused on continued diagnostic treatment of effective cueing during naming tasks and multi-modal communication tools.  SLP facilitated session with simple reading tasks at the word to phrase level. Pt matched written words to their corresponding objects from a field of three with 90% accuracy, with errors due to perseveration requiring Max A to assist. Pt followed one-step commands from written instructions with 100% accuracy with extra time. When provided with two-step directions from written instructions, pt required Mod cues to initiate task (suspect due to sustained attention), but carried out instructions with Min cues. Pt wrote her name when provided with the first initial written on paper. Pt's son Elenore Rota was present and engaged in session, asking questions and ways to assist his mother; SLP provided review of cueing attempts so far.  Continue with current plan of care.      FIM:  Comprehension Comprehension Mode: Auditory Comprehension: 4-Understands basic 75 - 89% of the time/requires cueing 10 - 24% of the time Expression Expression Mode: Verbal Expression: 2-Expresses basic 25 - 49% of the time/requires cueing 50 - 75% of the time. Uses single  words/gestures. Social Interaction Social Interaction: 2-Interacts appropriately 25 - 49% of time - Needs frequent redirection. Problem Solving Problem Solving: 2-Solves basic 25 - 49% of the time - needs direction more than half the time to initiate, plan or complete simple activities Memory Memory: 2-Recognizes or recalls 25 - 49% of the time/requires cueing 51 - 75% of the time  Pain Pain Assessment Pain Assessment: No/denies pain  Therapy/Group: Individual Therapy   Germain Osgood, M.A. CCC-SLP [phone]  Germain Osgood 12/11/2013, 12:12 PM

## 2013-12-11 NOTE — Progress Notes (Signed)
Subjective/Complaints: 63 y.o. right-handed female with history of hypertension. 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 CT and MRI imaging revealed acute hemorrhage centered in the left thalamus with extension into the left lateral ventricle no hydrocephalus. MRA of the head with no major occlusion or stenosis. Hemorrhage felt to be secondary to possible accelerated hypertension  Pt oriented to selp, sleeping but arouses to voice Review of Systems - cannot obtain secondary to severe aphasia  Objective: Vital Signs: Blood pressure 178/92, pulse 67, temperature 97.8 F (36.6 C), temperature source Oral, resp. rate 18, weight 131.5 kg (289 lb 14.5 oz), SpO2 93.00%. No results found. Results for orders placed during the hospital encounter of 12/08/13 (from the past 72 hour(s))  CBC WITH DIFFERENTIAL     Status: Abnormal   Collection Time    12/09/13  6:27 AM      Result Value Ref Range   WBC 10.6 (*) 4.0 - 10.5 K/uL   RBC 4.09  3.87 - 5.11 MIL/uL   Hemoglobin 13.1  12.0 - 15.0 g/dL   HCT 37.9  36.0 - 46.0 %   MCV 92.7  78.0 - 100.0 fL   MCH 32.0  26.0 - 34.0 pg   MCHC 34.6  30.0 - 36.0 g/dL   RDW 12.8  11.5 - 15.5 %   Platelets 159  150 - 400 K/uL   Neutrophils Relative % 79 (*) 43 - 77 %   Neutro Abs 8.4 (*) 1.7 - 7.7 K/uL   Lymphocytes Relative 9 (*) 12 - 46 %   Lymphs Abs 1.0  0.7 - 4.0 K/uL   Monocytes Relative 9  3 - 12 %   Monocytes Absolute 0.9  0.1 - 1.0 K/uL   Eosinophils Relative 3  0 - 5 %   Eosinophils Absolute 0.3  0.0 - 0.7 K/uL   Basophils Relative 0  0 - 1 %   Basophils Absolute 0.0  0.0 - 0.1 K/uL  COMPREHENSIVE METABOLIC PANEL     Status: Abnormal   Collection Time    12/09/13  6:27 AM      Result Value Ref Range   Sodium 137  137 - 147 mEq/L   Potassium 3.9  3.7 - 5.3 mEq/L   Chloride 97  96 - 112 mEq/L   CO2 32  19 - 32 mEq/L   Glucose, Bld 139  (*) 70 - 99 mg/dL   BUN 12  6 - 23 mg/dL   Creatinine, Ser 0.82  0.50 - 1.10 mg/dL   Calcium 9.4  8.4 - 10.5 mg/dL   Total Protein 7.2  6.0 - 8.3 g/dL   Albumin 3.1 (*) 3.5 - 5.2 g/dL   AST 15  0 - 37 U/L   ALT 17  0 - 35 U/L   Alkaline Phosphatase 94  39 - 117 U/L   Total Bilirubin 1.7 (*) 0.3 - 1.2 mg/dL   GFR calc non Af Amer 75 (*) >90 mL/min   GFR calc Af Amer 86 (*) >90 mL/min   Comment: (NOTE)     The eGFR has been calculated using the CKD EPI equation.     This calculation has not been validated in all clinical situations.     eGFR's persistently <90 mL/min signify possible Chronic Kidney     Disease.  URINALYSIS, ROUTINE W REFLEX MICROSCOPIC     Status: Abnormal   Collection Time  12/10/13  6:29 PM      Result Value Ref Range   Color, Urine AMBER (*) YELLOW   Comment: BIOCHEMICALS MAY BE AFFECTED BY COLOR   APPearance CLOUDY (*) CLEAR   Specific Gravity, Urine 1.025  1.005 - 1.030   pH 6.0  5.0 - 8.0   Glucose, UA NEGATIVE  NEGATIVE mg/dL   Hgb urine dipstick NEGATIVE  NEGATIVE   Bilirubin Urine SMALL (*) NEGATIVE   Ketones, ur 15 (*) NEGATIVE mg/dL   Protein, ur 100 (*) NEGATIVE mg/dL   Urobilinogen, UA 2.0 (*) 0.0 - 1.0 mg/dL   Nitrite NEGATIVE  NEGATIVE   Leukocytes, UA TRACE (*) NEGATIVE  URINE MICROSCOPIC-ADD ON     Status: Abnormal   Collection Time    12/10/13  6:29 PM      Result Value Ref Range   Squamous Epithelial / LPF MANY (*) RARE   WBC, UA 0-2  <3 WBC/hpf   RBC / HPF 0-2  <3 RBC/hpf   Bacteria, UA FEW (*) RARE   Casts HYALINE CASTS (*) NEGATIVE     HEENT: normal Cardio: RRR and no murmur Resp: CTA B/L and unlabored GI: BS positive and NT,ND Extremity:  Pulses positive and No Edema Skin:   Intact Neuro: Flat, Cranial Nerve Abnormalities R central VII, Abnormal Sensory Unable to assess secondary to aphasia, Abnormal Motor 4/5 R delt bi, tri, grip, HF, KE, ADF and Abnormal FMC Ataxic/ dec FMC Musc/Skel:  Normal Gen  NAD   Assessment/Plan: 1. Functional deficits secondary to Left Thalamic ICH which require 3+ hours per day of interdisciplinary therapy in a comprehensive inpatient rehab setting. Physiatrist is providing close team supervision and 24 hour management of active medical problems listed below. Physiatrist and rehab team continue to assess barriers to discharge/monitor patient progress toward functional and medical goals. FIM: FIM - Bathing Bathing Steps Patient Completed: Chest;Right Arm;Left Arm;Abdomen;Right upper leg;Left upper leg Bathing: 3: Mod-Patient completes 5-7 20f10 parts or 50-74%  FIM - Upper Body Dressing/Undressing Upper body dressing/undressing steps patient completed: Thread/unthread left sleeve of pullover shirt/dress;Put head through opening of pull over shirt/dress;Pull shirt over trunk;Thread/unthread right sleeve of pullover shirt/dresss Upper body dressing/undressing: 5: Set-up assist to: Obtain clothing/put away FIM - Lower Body Dressing/Undressing Lower body dressing/undressing: 1: Total-Patient completed less than 25% of tasks     FIM - TRadio producerDevices: WInsurance account managerTransfers: 3-From toilet/BSC: Mod A (lift or lower assist);4-To toilet/BSC: Min A (steadying Pt. > 75%)  FIM - Bed/Chair Transfer Bed/Chair Transfer Assistive Devices: Arm rests Bed/Chair Transfer: 3: Supine > Sit: Mod A (lifting assist/Pt. 50-74%/lift 2 legs;4: Bed > Chair or W/C: Min A (steadying Pt. > 75%)  FIM - Locomotion: Wheelchair Distance: 25 Locomotion: Wheelchair: 1: Travels less than 50 ft with maximal assistance (Pt: 25 - 49%) FIM - Locomotion: Ambulation Locomotion: Ambulation: 0: Activity did not occur (Not assessed; unsafe at the time)  Comprehension Comprehension Mode: Auditory Comprehension: 3-Understands basic 50 - 74% of the time/requires cueing 25 - 50%  of the time  Expression Expression Mode: Verbal Expression: 2-Expresses basic 25  - 49% of the time/requires cueing 50 - 75% of the time. Uses single words/gestures.  Social Interaction Social Interaction: 2-Interacts appropriately 25 - 49% of time - Needs frequent redirection.  Problem Solving Problem Solving: 1-Solves basic less than 25% of the time - needs direction nearly all the time or does not effectively solve problems and may need a restraint for safety  Memory Memory: 2-Recognizes or recalls 25 - 49% of the time/requires cueing 51 - 75% of the time  Medical Problem List and Plan:  1. 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 DVT Prophylaxis/Anticoagulation: SCDs. Monitor for any signs of DVT  3. Pain Management: Tylenol as needed. Monitor with increased mobility  4. Neuropsych: This patient is not yet capable of making decisions on her own behalf.  5. Hypertension.  -remains poorly controlled  -increase Norvasc to 75m qd  - Avapro 300 mg daily, hydrochlorothiazide 12.5 mg daily, Lopressor 100 mg twice a day. Monitor with increased mobility, titrate as needed   LOS (Days) 3 A FACE TO FACE EVALUATION WAS PERFORMED  Jonus Coble E 12/11/2013, 7:18 AM

## 2013-12-11 NOTE — IPOC Note (Addendum)
Overall Plan of Care [facility]) Patient Details Name: Melissa [name] MRN: [id number] DOB: 1951-02-14  Admitting Diagnosis: L THALAMIC HEMORRHAGE  Hospital Problems: Active Problems:   Thalamic hemorrhage     Functional Problem List: Nursing Bladder;Bowel;Medication Management;Nutrition;Safety;Skin Integrity  PT Balance;Endurance;Motor;Safety;Sensory;Other (comment) (Cognition)  OT Balance;Cognition;Endurance;Motor;Perception;Safety;Sensory;Vision  SLP Cognition;Linguistic;Safety  TR  activity tolerance, balance, cognition       Basic ADL's: OT Grooming;Eating;Bathing;Dressing;Toileting     Advanced  ADL's: OT       Transfers: PT Bed Mobility;Bed to Chair;Car;Furniture  OT Toilet;Tub/Shower     Locomotion: PT Ambulation;Wheelchair Mobility;Stairs     Additional Impairments: OT Fuctional Use of Upper Extremity  SLP Communication;Social Cognition comprehension;expression Social Interaction;Problem Solving;Memory;Attention;Awareness  TR      Anticipated Outcomes Item Anticipated Outcome  Self Feeding supervision  Swallowing  Supervision due to cognitive-linguistic deficits   Basic self-care  supervision  Toileting  supervison   Bathroom Transfers supervision  Bowel/Bladder  Continent of bowel and bladder with minimal assist  Transfers  Supervision to Min A  Locomotion  Min A to Mod A  Communication  Mod assist   Cognition  Min assist   Pain  3 or less out of 10  Safety/Judgment  Minimal assist   Therapy Plan: PT Intensity: Minimum of 1-2 x/day ,45 to 90 minutes PT Frequency: 5 out of 7 days PT Duration Estimated Length of Stay: 17-20 OT Intensity: Minimum of 1-2 x/day, 45 to 90 minutes OT Frequency: 5 out of 7 days OT Duration/Estimated Length of Stay: 17-21 days SLP Intensity: Minumum of 1-2 x/day, 30 to 90 minutes SLP Frequency: 5 out of 7 days SLP Duration/Estimated Length of Stay: 17-21 days       Team Interventions: Nursing Interventions  Patient/Family Education;Bowel Management;Bladder Management;Disease Management/Prevention;Medication Management;Skin Care/Wound Management;Cognitive Remediation/Compensation;Dysphagia/Aspiration Precaution Training  PT interventions Ambulation/gait training;Balance/vestibular training;Cognitive remediation/compensation;Discharge planning;Disease management/prevention;Functional mobility training;Pain management;Patient/family education;Neuromuscular re-education;DME/adaptive equipment instruction;Wheelchair propulsion/positioning;UE/LE Coordination activities;UE/LE Strength taining/ROM;Stair training;Splinting/orthotics;Therapeutic Exercise;Therapeutic Activities  OT Interventions Balance/vestibular training;Cognitive remediation/compensation;Discharge planning;DME/adaptive equipment instruction;Functional mobility training;Patient/family education;Psychosocial support;Self Care/advanced ADL retraining;Therapeutic Activities;Therapeutic Exercise;UE/LE Strength taining/ROM;UE/LE Coordination activities;Visual/perceptual remediation/compensation  SLP Interventions Cueing hierarchy;Cognitive remediation/compensation;Environmental controls;Functional tasks;Internal/external aids;Patient/family education;Multimodal communication approach;Speech/Language facilitation;Therapeutic Activities  TR Interventions  rec/leisure participation, therapeutic activities, functional mobility, UE/LE srtength/coordination,  Psychosocial support, discharge planning, pt/family education, adaptive equipment use/education, community reintegration.  SW/CM Interventions Discharge Planning;Psychosocial Support;Patient/Family Education    Team Discharge Planning: Destination: PT-  ,OT- Home , SLP-Home Projected Follow-up: PT-24 hour supervision/assistance;Home health PT, OT-  Home health OT, SLP-Outpatient SLP;24 hour supervision/assistance Projected Equipment Needs: PT-To be determined, OT- 3 in 1 bedside comode;Tub/shower seat,  SLP-None recommended by SLP Equipment Details: PT-Pt does not own any personal assistive devices., OT-  Patient/family involved in discharge planning: PT- Patient;Family member/caregiver,  OT-Patient, SLP-Patient;Family member/caregiver  MD ELOS: 22-28d Medical Rehab Prognosis:  Good Assessment: 63 y.o. right-handed female with history of hypertension. 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 CT and MRI imaging revealed acute hemorrhage centered in the left thalamus with extension into the left lateral ventricle no hydrocephalus. MRA of the head with no major occlusion or stenosis. Hemorrhage felt to be secondary to possible accelerated hypertension  Now requiring 24/7 Rehab RN,MD, as well as CIR level PT, OT and SLP.  Treatment team will focus on ADLs and mobility with goals set at sup    See Team Conference Notes for weekly updates to the plan of care

## 2013-12-12 ENCOUNTER — Inpatient Hospital Stay (HOSPITAL_COMMUNITY): Payer: 59 | Admitting: Speech Pathology

## 2013-12-12 ENCOUNTER — Inpatient Hospital Stay (HOSPITAL_COMMUNITY): Payer: 59 | Admitting: Physical Therapy

## 2013-12-12 ENCOUNTER — Inpatient Hospital Stay (HOSPITAL_COMMUNITY): Payer: 59 | Admitting: Occupational Therapy

## 2013-12-12 DIAGNOSIS — I6350 Cerebral infarction due to unspecified occlusion or stenosis of unspecified cerebral artery: Secondary | ICD-10-CM

## 2013-12-12 NOTE — Progress Notes (Signed)
Physical Therapy Session Note  Patient Details  Name: Melissa [name] MRN: [id number] Date of Birth: Dec 26, 1950  Today's Date: 12/12/2013 Time: 0900-1000 Time Calculation (min): 60 min  Short Term Goals: Week 1:  PT Short Term Goal 1 (Week 1): Pt will consistently perform supine <>sit with min A with HOB flat using bed rail. PT Short Term Goal 2 (Week 1): Pt will consistently perform bed<>chair transfer with mod A. PT Short Term Goal 3 (Week 1): Pt will perform w/c mobility x50' in controlled environment with mod A, mod cueing. PT Short Term Goal 4 (Week 1): Pt will perform gait x10' in controlled environment with Total A using LRAD. PT Short Term Goal 5 (Week 1): Pt to negotiate 3 steps with bilat rails requiring +2Total A.  Therapy Documentation Precautions:  Precautions Precautions: Fall Precaution Comments: Impaired motor planning RUE/LE. 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); yes/no questions and demonstration most effective Restrictions Weight Bearing Restrictions: No Vital Signs: Therapy Vitals Temp: 98 F (36.7 C) Temp src: Oral Pulse Rate: 83 Resp: 20 BP: 143/76 mmHg Patient Position, if appropriate: Lying Oxygen Therapy SpO2: 92 % O2 Device: None (Room air) Pain:denies pain  Therapeutic Activity:(15') Transfers supine to sit via R log roll with Mod-assist for B LE's Therapeutic Exercise:(15') B LE's in supine including ankle pumps, quad sets, gluteal sets Gait Training:(15') using RW with min-assist 2 x 60' with seated rest. Patient drifts/leans to the right needing min-assist on occasion. W/C Management/Mobility:(15') propelling w/c with B LE's in sitting with min-assist 2 x 100'. Tends to drift toward the right.  Therapy/Group: Individual Therapy  Terralyn Matsumura J 12/12/2013, 9:08 AM

## 2013-12-12 NOTE — Progress Notes (Signed)
Speech Language Pathology Daily Session Note  Patient Details  Name: Melissa [name] MRN: [id number] Date of Birth: Jun 16, 1951  Today's Date: 12/12/2013 Time: 0800-0842 Time Calculation (min): 42 min  Short Term Goals: Week 1: SLP Short Term Goal 1 (Week 1): Patient will name basic ADL objects with Mod assist semantic and phonemic cues SLP Short Term Goal 2 (Week 1): Patient will match word to object in a field of 3 with Mod assist clinician cues SLP Short Term Goal 3 (Week 1): Patient will express basic wants and needs with Max cues to utilize multi-modal communcation strategies SLP Short Term Goal 4 (Week 1): Patient will follow 2-step directions with Mod vebral and visual cues  Skilled Therapeutic Interventions: Skilled treatment session focused on cognitive-linguistic goals. Upon arrival, pt sitting up in bed, eating breakfast. Pt without overt s/s of aspiration with regular textures and thin liquids but required supervision verbal cues for utilization of small bites. SLP facilitated session by providing Max A multimodal cueing to name functional items in picture cards. Pt identified the correct item that didn't belong from a field of four with 100% accuracy. Pt demonstrated intermittent perseveration throughout the session and also demonstrated intermittent decreased frustration tolerance, however, patient was easily redirected.  Pt required Min verbal and question cues to self-monitor verbal errors and total A to self-correct. Patient engaged in functional conversation and verbally reported biographical information with 90% accuracy with Mod multimodal cues and extra time. Patient's husband present throughout the session.    FIM:  Comprehension Comprehension Mode: Auditory Comprehension: 4-Understands basic 75 - 89% of the time/requires cueing 10 - 24% of the time Expression Expression Mode: Verbal Expression: 2-Expresses basic 25 - 49% of the time/requires cueing 50 - 75% of the time.  Uses single words/gestures. Social Interaction Social Interaction: 3-Interacts appropriately 50 - 74% of the time - May be physically or verbally inappropriate. Problem Solving Problem Solving: 2-Solves basic 25 - 49% of the time - needs direction more than half the time to initiate, plan or complete simple activities Memory Memory: 2-Recognizes or recalls 25 - 49% of the time/requires cueing 51 - 75% of the time FIM - Eating Eating Activity: 5: Set-up assist for open containers;5: Supervision/cues  Pain Pain Assessment Pain Assessment: No/denies pain  Therapy/Group: Individual Therapy  Vara Mairena 12/12/2013, 11:31 AM

## 2013-12-12 NOTE — Progress Notes (Signed)
Occupational Therapy Session Note  Patient Details  Name: Melissa [name] MRN: [id number] Date of Birth: 10-Nov-1950  Today's Date: 12/12/2013 Time: 1030-1130 Time Calculation (min): 60 min  Skilled Therapeutic Interventions/Progress Updates: Upon approach for ADL, patient in need for toileting.  This clinician and patient's husband assisted her with Max A with 2nd person standing by transfer to patient' left to 3:1.   Patient c/o much fatigue after just having completed PT.  She was dependent for pericleansing and donning and pulling up pants leaned on sink to rest.    She completed the rest of the ADL in her w/c at the sink with overall min A and extra time for rest breaks as she c/o fatigue.  At the end of the session, she asked this clinician to assist her to bed for rest break.  She went right to sleep.  Call bell and phone were placed within patient reach.  Husband stated he would set bed alarm or ask staff to do when he left so that alarm would not go off when his wife moved in bed).    Therapy Documentation Precautions:  Precautions Precautions: Fall Precaution Comments: Impaired motor planning RUE/LE. 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); yes/no questions and demonstration most effective Restrictions Weight Bearing Restrictions: No  Pain:denied    See FIM for current functional status  Therapy/Group: Individual Therapy  Alfredia Ferguson [facility] 12/12/2013, 3:58 PM

## 2013-12-12 NOTE — Plan of Care (Signed)
Problem: RH BOWEL ELIMINATION Goal: RH STG MANAGE BOWEL WITH ASSISTANCE STG Manage Bowel with min Assistance.  Outcome: Not Progressing Patient has some urgency ,incontinence- incont

## 2013-12-12 NOTE — Progress Notes (Signed)
Physical Therapy Session Note  Patient Details  Name: Melissa [name] MRN: [id number] Date of Birth: 03-13-1951  Today's Date: 12/12/2013 Time: 1430-1500 Time Calculation (min): 30 min  Short Term Goals: Week 1:  PT Short Term Goal 1 (Week 1): Pt will consistently perform supine <>sit with min A with HOB flat using bed rail. PT Short Term Goal 2 (Week 1): Pt will consistently perform bed<>chair transfer with mod A. PT Short Term Goal 3 (Week 1): Pt will perform w/c mobility x50' in controlled environment with mod A, mod cueing. PT Short Term Goal 4 (Week 1): Pt will perform gait x10' in controlled environment with Total A using LRAD. PT Short Term Goal 5 (Week 1): Pt to negotiate 3 steps with bilat rails requiring +2Total A.  Therapy Documentation Precautions:  Precautions Precautions: Fall Precaution Comments: Impaired motor planning RUE/LE. 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); yes/no questions and demonstration most effective Restrictions Weight Bearing Restrictions: No Pain: denies pain  Therapeutic Exercise;(15') B LE's with 3# ankle weights with Long Arc Quads and marching bent knees in sitting. (Multiple sets of 10 with rest breaks) Gait Training:(15') using RW 1 x 75' and 1 x 9' with min-assist. RW with min-assist with walking inside room short turning/pivoting to get BTB at end of session.    Therapy/Group: Individual Therapy  Clearence Ped 12/12/2013, 5:12 PM

## 2013-12-12 NOTE — Progress Notes (Signed)
Subjective/Complaints: 63 y.o. right-handed female with history of hypertension. 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 CT and MRI imaging revealed acute hemorrhage centered in the left thalamus with extension into the left lateral ventricle no hydrocephalus. MRA of the head with no major occlusion or stenosis. Hemorrhage felt to be secondary to possible accelerated hypertension  Patient denies complaints. She is a Data processing manager in my practice. She knows who I am. She is able to recall coworkers. Her speech is a face 60 takes quite some time for her to recall names  Objective: Vital Signs: Blood pressure 137/77, pulse 80, temperature 98 F (36.7 C), temperature source Oral, resp. rate 20, weight 289 lb 14.5 oz (131.5 kg), SpO2 92.00%.  Overweight female in no acute distress. Chest clear to auscultation. Cardiac exam S1-S2 are regular. Abdominal exam active bowel sounds, soft. Extremities no edema.  Assessment/Plan: 1. Functional deficits secondary to Left Thalamic ICH  Medical Problem List and Plan:  1. 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 DVT Prophylaxis/Anticoagulation: SCDs. Monitor for any signs of DVT  3. Pain Management: Tylenol as needed. Monitor with increased mobility  4. Neuropsych: This patient is not yet capable of making decisions on her own behalf.  Husband is in the room with her. 5. Hypertension.  Better controlled today. -increase Norvasc to 10mg  qd  - Avapro 300 mg daily, hydrochlorothiazide 12.5 mg daily, Lopressor 100 mg twice a day. Monitor with increased mobility, titrate as needed   LOS (Days) 4 A FACE TO FACE EVALUATION WAS PERFORMED  Melissa [name] HENRY 12/12/2013, 10:43 AM

## 2013-12-13 MED ORDER — HYDRALAZINE HCL 25 MG PO TABS
25.0000 mg | ORAL_TABLET | Freq: Three times a day (TID) | ORAL | Status: DC
  Administered 2013-12-13 – 2013-12-15 (×7): 25 mg via ORAL
  Filled 2013-12-13 (×11): qty 1

## 2013-12-13 NOTE — Progress Notes (Signed)
Subjective/Complaints: 63 y.o. right-handed female with history of hypertension. 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 CT and MRI imaging revealed acute hemorrhage centered in the left thalamus with extension into the left lateral ventricle no hydrocephalus. MRA of the head with no major occlusion or stenosis. Hemorrhage felt to be secondary to possible accelerated hypertension  No complaints this morning. Her speech seems somewhat improved when she speaks slowly.  Objective: Vital Signs: Blood pressure 157/81, pulse 65, temperature 97.3 F (36.3 C), temperature source Oral, resp. rate 17, weight 289 lb 14.5 oz (131.5 kg), SpO2 98.00%.  No acute distress. No increased work of breathing. Cardiac exam: S1-S2 are regular. Abdominal exam: Overweight Extremities: No edema Speech: She has word finding difficulty. When she is prompted to slow down her speech is significantly improved.  Assessment/Plan: 1. Functional deficits secondary to Left Thalamic ICH  Medical Problem List and Plan:  1. 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 DVT Prophylaxis/Anticoagulation: SCDs. Monitor for any signs of DVT  3. Pain Management: Tylenol as needed. Monitor with increased mobility  4. Neuropsych: This patient is not yet capable of making decisions on her own behalf.  Husband is in the room with her. 5. Hypertension.  Blood pressure ranging 135-176/70-78. I am going to add a fifth blood pressure medication: Hydralazine 25 mg by mouth 3 times a day  Norvasc to 10mg  qd  - Avapro 300 mg daily, hydrochlorothiazide 12.5 mg daily, Lopressor 100 mg twice a day.   LOS (Days) 5 A [place] 12/13/2013, 8:39 AM

## 2013-12-14 ENCOUNTER — Inpatient Hospital Stay (HOSPITAL_COMMUNITY): Payer: 59

## 2013-12-14 ENCOUNTER — Inpatient Hospital Stay (HOSPITAL_COMMUNITY): Payer: 59 | Admitting: Physical Therapy

## 2013-12-14 ENCOUNTER — Inpatient Hospital Stay (HOSPITAL_COMMUNITY): Payer: 59 | Admitting: Occupational Therapy

## 2013-12-14 DIAGNOSIS — R4701 Aphasia: Secondary | ICD-10-CM

## 2013-12-14 DIAGNOSIS — I619 Nontraumatic intracerebral hemorrhage, unspecified: Secondary | ICD-10-CM

## 2013-12-14 DIAGNOSIS — I10 Essential (primary) hypertension: Secondary | ICD-10-CM

## 2013-12-14 DIAGNOSIS — R1310 Dysphagia, unspecified: Secondary | ICD-10-CM

## 2013-12-14 NOTE — Progress Notes (Signed)
Physical Therapy Session Note  Patient Details  Name: Melissa [name] MRN: [id number] Date of Birth: 1951-08-14  Today's Date: 12/14/2013 Time: 1219-7588 Time Calculation (min): 60 min  Short Term Goals: Week 1:  PT Short Term Goal 1 (Week 1): Pt will consistently perform supine <>sit with min A with HOB flat using bed rail. PT Short Term Goal 2 (Week 1): Pt will consistently perform bed<>chair transfer with mod A. PT Short Term Goal 3 (Week 1): Pt will perform w/c mobility x50' in controlled environment with mod A, mod cueing. PT Short Term Goal 4 (Week 1): Pt will perform gait x10' in controlled environment with Total A using LRAD. PT Short Term Goal 5 (Week 1): Pt to negotiate 3 steps with bilat rails requiring +2Total A.  Skilled Therapeutic Interventions/Progress Updates:    Pt received supine in bed with sister and brother-in-law present. Pt asleep but easily aroused; agreeable to session. Performed w/c mobility x150' in controlled and home environments with bilat LE's, min A and subtle to min cueing for obstacle negotiation on R side. Performed gait x75', x82', x101' in controlled and home environments with rolling walker and min guard. Performed sit<>stand from couch with min A for anterior weight shift, bilat hip extension. Multiple stand pivot transfers in rehab apartment with min guard-min A, multimodal cueing for safe hand placement. Negotiated 3 stairs with bilat rails, forward-facing with step-to pattern and mod A. Returned to room, where pt was left semi-reclined in bed with bed alarm on, 3 bed rails, sister present, and all needs within reach. Overall, activity tolerance improved during this session.  Therapy Documentation Precautions:  Precautions Precautions: Fall Precaution Comments: Impaired motor planning RUE/LE. 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 Locomotion : Ambulation Ambulation/Gait Assistance: 4: Min Financial controller Distance: 150   See FIM for current functional status  Therapy/Group: Individual Therapy  Stefano Gaul 12/14/2013, 5:04 PM

## 2013-12-14 NOTE — Progress Notes (Signed)
Occupational Therapy Session Note  Patient Details  Name: Melissa [name] MRN: [id number] Date of Birth: July 16, 1951  Today's Date: 12/14/2013 Time: 1130-1200 Time Calculation (min): 30 min  Short Term Goals: Week 1:  OT Short Term Goal 1 (Week 1): Pt will complete toilet transfer with mod assist OT Short Term Goal 2 (Week 1): Pt will verbally identify 50% of clothing items by name  OT Short Term Goal 3 (Week 1): Pt will complete LB dressing with max assist  OT Short Term Goal 4 (Week 1): Pt will complete shower transfer with mod assist  Skilled Therapeutic Interventions/Progress Updates:    Pt seen for RUE neuro re-ed with bilateral arom coordination exercises. Pt has full AROM but slower movement and coordination on the R. She was able to tie shoes (shoes on her lap, not her feet) and touch her thumb to each finger.  She worked on sit to stand from bed with steady assist.  In standing she worked on lateral stepping and hip sways to challenge her balance. Pt could only tolerate standing for 30 seconds at a time due to fatigue and c/o dizziness.  Pt transferred to w/c with min A to prepare for lunch.  Pt resting in w/c with QRB on and call light in reach. Her sister was in the room and observed the therapy session.  Therapy Documentation Precautions:  Precautions Precautions: Fall Precaution Comments: Impaired motor planning RUE/LE. 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

## 2013-12-14 NOTE — Progress Notes (Signed)
Occupational Therapy Session Note  Patient Details  Name: Melissa [name] MRN: [id number] Date of Birth: 05-13-1951  Today's Date: 12/14/2013 Time: 0830-0930 Time Calculation (min): 60 min  Short Term Goals: Week 1:  OT Short Term Goal 1 (Week 1): Pt will complete toilet transfer with mod assist OT Short Term Goal 2 (Week 1): Pt will verbally identify 50% of clothing items by name  OT Short Term Goal 3 (Week 1): Pt will complete LB dressing with max assist  OT Short Term Goal 4 (Week 1): Pt will complete shower transfer with mod assist  Skilled Therapeutic Interventions/Progress Updates:    Pt seen for ADL retraining with focus on functional use of RUE, functional transfers, expressive language, and problem solving. Pt received supine in bed with sister present. Ambulated to walk-in shower with min assist and min cues for hand placement. Completed bathing using BUEs equally and pt able to wash hair with RUE with min assist. Pt with improved sustained grip on wash cloth and comb as she brushed entire hair with RUE and not dropping comb. Completed sit<>stand with min-supervision (1x) for hygiene and clothing management. Pt with mod cues for problem solving for LB clothing to utilize crossover technique and propping on stool. Provided shoe buttons and pt with carryover after providing demonstration. Emphasis on identifying clothing items and colors with accuracy approx 60% of time. Completed oral care at sink with ideational apraxia noted. Present pt with cup, toothbrush, and toothpaste. Pt identified each then placed toothpaste in water and began stirring it with handle end of toothbrush. Required max cues for correcting. Therapist provided theraputty with beads for working on [facility] in room. Pt left sitting in w/c with all needs in reach.   Therapy Documentation Precautions:  Precautions Precautions: Fall Precaution Comments: Impaired motor planning RUE/LE. 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   Other Treatments:    See FIM for current functional status  Therapy/Group: Individual Therapy  Duayne Cal 12/14/2013, 11:46 AM

## 2013-12-14 NOTE — Progress Notes (Signed)
Subjective/Complaints: 63 y.o. right-handed female with history of hypertension. 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 CT and MRI imaging revealed acute hemorrhage centered in the left thalamus with extension into the left lateral ventricle no hydrocephalus. MRA of the head with no major occlusion or stenosis. Hemorrhage felt to be secondary to possible accelerated hypertension  Remains aphasic, follows commands with gestural cues  Review of Systems - cannot obtain secondary to severe aphasia  Objective: Vital Signs: Blood pressure 154/77, pulse 65, temperature 98 F (36.7 C), temperature source Oral, resp. rate 19, weight 131.5 kg (289 lb 14.5 oz), SpO2 97.00%. No results found. No results found for this or any previous visit (from the past 72 hour(s)).   HEENT: normal Cardio: RRR and no murmur Resp: CTA B/L and unlabored GI: BS positive and NT,ND Extremity:  Pulses positive and No Edema Skin:   Intact Neuro: Flat, Cranial Nerve Abnormalities R central VII, Abnormal Sensory Unable to assess secondary to aphasia, Abnormal Motor 4/5 R delt bi, tri, grip, HF, KE, ADF and Abnormal FMC Ataxic/ dec FMC Musc/Skel:  Normal Gen NAD   Assessment/Plan: 1. Functional deficits secondary to Left Thalamic ICH which require 3+ hours per day of interdisciplinary therapy in a comprehensive inpatient rehab setting. Physiatrist is providing close team supervision and 24 hour management of active medical problems listed below. Physiatrist and rehab team continue to assess barriers to discharge/monitor patient progress toward functional and medical goals. FIM: FIM - Bathing Bathing Steps Patient Completed: Chest;Right Arm;Abdomen;Right upper leg;Left upper leg Bathing: 3: Mod-Patient completes 5-7 42f 10 parts or 50-74%  FIM - Upper Body Dressing/Undressing Upper body dressing/undressing  steps patient completed: Thread/unthread left sleeve of pullover shirt/dress;Put head through opening of pull over shirt/dress;Thread/unthread right sleeve of pullover shirt/dresss Upper body dressing/undressing: 4: Min-Patient completed 75 plus % of tasks FIM - Lower Body Dressing/Undressing Lower body dressing/undressing steps patient completed: Thread/unthread left pants leg Lower body dressing/undressing: 1: Total-Patient completed less than 25% of tasks  FIM - Toileting Toileting steps completed by patient: Performs perineal hygiene Toileting Assistive Devices: Grab bar or rail for support Toileting: 1: Total-Patient completed zero steps, helper did all 3  FIM - Radio producer Devices: Recruitment consultant Transfers: 3-To toilet/BSC: Mod A (lift or lower assist);3-From toilet/BSC: Mod A (lift or lower assist)  FIM - Bed/Chair Transfer Bed/Chair Transfer Assistive Devices: Arm rests;Walker Bed/Chair Transfer: 4: Bed > Chair or W/C: Min A (steadying Pt. > 75%);4: Chair or W/C > Bed: Min A (steadying Pt. > 75%)  FIM - Locomotion: Wheelchair Distance: 115 Locomotion: Wheelchair: 2: Travels 50 - 149 ft with minimal assistance (Pt.>75%) FIM - Locomotion: Ambulation Locomotion: Ambulation Assistive Devices: Administrator Ambulation/Gait Assistance: 3: Mod assist Locomotion: Ambulation: 2: Travels 50 - 149 ft with moderate assistance (Pt: 50 - 74%)  Comprehension Comprehension Mode: Auditory Comprehension: 4-Understands basic 75 - 89% of the time/requires cueing 10 - 24% of the time  Expression Expression Mode: Verbal Expression Assistive Devices: 6-Other (Comment) (speech clear but w/ word finding ,expressive apha) Expression: 2-Expresses basic 25 - 49% of the time/requires cueing 50 - 75% of the time. Uses single words/gestures.  Social Interaction Social Interaction: 3-Interacts appropriately 50 - 74% of the time - May be physically or verbally  inappropriate.  Problem Solving Problem Solving: 2-Solves basic 25 - 49% of the time - needs direction more than half the  time to initiate, plan or complete simple activities  Memory Memory: 2-Recognizes or recalls 25 - 49% of the time/requires cueing 51 - 75% of the time  Medical Problem List and Plan:  1. 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 DVT Prophylaxis/Anticoagulation: SCDs. Monitor for any signs of DVT  3. Pain Management: Tylenol as needed. Monitor with increased mobility  4. Neuropsych: This patient is not yet capable of making decisions on her own behalf.  5. Hypertension.  -remains poorly controlled  -increase Norvasc to 10mg  qd  - Avapro 300 mg daily, hydrochlorothiazide 12.5 mg daily, Lopressor 100 mg twice a day. Monitor with increased mobility, titrate as needed   LOS (Days) 6 A FACE TO FACE EVALUATION WAS PERFORMED  Catarino Vold E 12/14/2013, 8:13 AM

## 2013-12-14 NOTE — Progress Notes (Signed)
Speech Language Pathology Daily Session Note  Patient Details  Name: Melissa [name] MRN: [id number] Date of Birth: 1951-06-27  Today's Date: 12/14/2013 Time: 1000-1045 Time Calculation (min): 45 min  Short Term Goals: Week 1: SLP Short Term Goal 1 (Week 1): Patient will name basic ADL objects with Mod assist semantic and phonemic cues SLP Short Term Goal 2 (Week 1): Patient will match word to object in a field of 3 with Mod assist clinician cues SLP Short Term Goal 3 (Week 1): Patient will express basic wants and needs with Max cues to utilize multi-modal communcation strategies SLP Short Term Goal 4 (Week 1): Patient will follow 2-step directions with Mod vebral and visual cues  Skilled Therapeutic Interventions: Treatment focused on cognitive-linguistic goals. SLP facilitated session with Mod sentence completion and phonemic cues for naming common objects. Pt receptively identified common objects from a field of 3 with 100% accuracy with Mod I. Pt completed automatic speech tasks with Min written cues due to perseverative errors. When presented with written prompt for months of the year out of sequential order, pt required Mod-Max cues for accuracy. Continue plan of care.   FIM:  Comprehension Comprehension Mode: Auditory Comprehension: 4-Understands basic 75 - 89% of the time/requires cueing 10 - 24% of the time Expression Expression Mode: Verbal Expression: 3-Expresses basic 50 - 74% of the time/requires cueing 25 - 50% of the time. Needs to repeat parts of sentences. Social Interaction Social Interaction: 4-Interacts appropriately 75 - 89% of the time - Needs redirection for appropriate language or to initiate interaction. Problem Solving Problem Solving: 3-Solves basic 50 - 74% of the time/requires cueing 25 - 49% of the time Memory Memory: 3-Recognizes or recalls 50 - 74% of the time/requires cueing 25 - 49% of the time  Pain Pain Assessment Pain Assessment: No/denies  pain  Therapy/Group: Individual Therapy   Germain Osgood, M.A. CCC-SLP [phone]  Germain Osgood 12/14/2013, 11:51 AM

## 2013-12-15 ENCOUNTER — Inpatient Hospital Stay (HOSPITAL_COMMUNITY): Payer: 59 | Admitting: Occupational Therapy

## 2013-12-15 ENCOUNTER — Inpatient Hospital Stay (HOSPITAL_COMMUNITY): Payer: 59

## 2013-12-15 ENCOUNTER — Inpatient Hospital Stay (HOSPITAL_COMMUNITY): Payer: 59 | Admitting: Physical Therapy

## 2013-12-15 DIAGNOSIS — I10 Essential (primary) hypertension: Secondary | ICD-10-CM

## 2013-12-15 DIAGNOSIS — R4701 Aphasia: Secondary | ICD-10-CM

## 2013-12-15 DIAGNOSIS — R1310 Dysphagia, unspecified: Secondary | ICD-10-CM

## 2013-12-15 DIAGNOSIS — I619 Nontraumatic intracerebral hemorrhage, unspecified: Secondary | ICD-10-CM

## 2013-12-15 MED ORDER — HYDRALAZINE HCL 25 MG PO TABS
25.0000 mg | ORAL_TABLET | Freq: Four times a day (QID) | ORAL | Status: DC
  Administered 2013-12-15 – 2013-12-19 (×13): 25 mg via ORAL
  Filled 2013-12-15 (×20): qty 1

## 2013-12-15 NOTE — Progress Notes (Signed)
Physical Therapy Session Note  Patient Details  Name: Melissa [name] MRN: [id number] Date of Birth: 01/10/1951  Today's Date: 12/15/2013 Time: 1000-1056 Time Calculation (min): 56 min  Short Term Goals: Week 1:  PT Short Term Goal 1 (Week 1): Pt will consistently perform supine <>sit with min A with HOB flat using bed rail. PT Short Term Goal 2 (Week 1): Pt will consistently perform bed<>chair transfer with mod A. PT Short Term Goal 3 (Week 1): Pt will perform w/c mobility x50' in controlled environment with mod A, mod cueing. PT Short Term Goal 4 (Week 1): Pt will perform gait x10' in controlled environment with Total A using LRAD. PT Short Term Goal 5 (Week 1): Pt to negotiate 3 steps with bilat rails requiring +2Total A.  Skilled Therapeutic Interventions/Progress Updates:   Pt received seated in w/c with husband present. Pt agreeable to therapy. Session focused on improving safety/stability with functional transfers, gait, and stair negotiation as well as initiating hands-on family training.   Pt performed w/c mobility x120' in controlled environment with bilat LE's, min A for R-sided obstacle negotiation. Therapist educated husband on importance of providing demonstration and tactile cueing rather than verbal cueing due to aphasia. PT also explained, demonstrated importance of starting with subtle cueing (standing on pt's R side during w/c mobility) rather than simply correcting by pushing w/c to maximize pt learning. Husband verbalized understanding but required reinforcement during remainder of session.  Pt performed gait x152', x68', x128', x215' (seated rest breaks x2-4 minutes between each trial) in controlled environment with rolling walker and min A, subtle cueing for R-sided attention/obstacle negotiation; multimodal cueing to decrease gait speed and to focus on safety and quality of movement. Therapist explained, demonstrated car transfer with rolling walker, min A for RLE  management, and mod multimodal cueing for safe hand placement (focus on UE support on stable objects). Husband then provided min A with car transfer; verbal cueing required to remind husband of safe hand placement. Husband will require reinforcement. Negotiation of 5 steps x2 trials with bilat rails forward-facing, step-to pattern with min A for RLE management, min tactile cueing for sequencing with effective return demonstration. After initial trial, therapist demonstrated pt difficulty clearing R heel on step during descent, then demonstrated compensatory strategy . Pt then performed second trial with effective return demonstration of said compensatory strategy.   Per husband request to assist with pt transfers in pt room, therapist explained and demonstrated stand pivot transfer from bed<>w/c with rolling walker and min A. Per husband request to perform transfer without rolling walker, therapist strongly recommended that walker be utilized, as pt transfers much more safely with device. Husband in agreement. Husband then performed stand pivot transfer from bed<>chair with rolling walker, min A, appropriate cueing for safe hand placement. This therapist determined husband safe to assist with transfer from bed<>chair only. Safety sheet updated and RN notified. Therapist departed with pt seated in w/c with quick release belt on, husband present, and all needs within reach.   The following long term goals were upgraded secondary to pt progressing more quickly than anticipated: ambulation in controlled and home environments; car transfers. Long term goal added to address community ambulation.  Therapy Documentation Precautions:  Precautions Precautions: Fall Precaution Comments: Impaired motor planning RUE/LE. 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

## 2013-12-15 NOTE — Progress Notes (Signed)
Occupational Therapy Session Note  Patient Details  Name: Melissa [name] MRN: [id number] Date of Birth: Aug 10, 1951  Today's Date: 12/15/2013 Time: 0109-3235 Time Calculation (min): 30 min  Short Term Goals: Week 2:  OT Short Term Goal 1 (Week 2): Focus on LTGs  Skilled Therapeutic Interventions/Progress Updates:  Patient sleeping in bed upon arrival with husband at her side.  Engaged in [place], toilet transfers, toileting, grooming at sink and coordination activities.  Patient donned lace up tennis shoes with shoe buttons and required multimodal cues not to untie the laces for each shoe as well as for where to pull on lace to then place lace on shoe button.  Patient's husband reports that he has been assisting patient with RW to the bathroom therefore allowed him to complete this task.  Patient standing with RW and husband walked away from patient to open bathroom door then noticed items were blocking access and needed to be removed so he proceeded to leave her standing alone to complete task.  Reviewed that since I was present I could provided supervision while he walks away from her and he stated, "she is really strong" indicating that she would be OK to stand without him close by.  Patient's husband completed all toileting tasks for patient instead of allowing patient to practice/learn.  Husband reports, "I probably do more for her than I should".    Therapy Documentation Precautions:  Precautions Precautions: Fall Precaution Comments: Impaired motor planning RUE/LE. 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

## 2013-12-15 NOTE — Progress Notes (Signed)
Subjective/Complaints: 63 y.o. right-handed female with history of hypertension. 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 CT and MRI imaging revealed acute hemorrhage centered in the left thalamus with extension into the left lateral ventricle no hydrocephalus. MRA of the head with no major occlusion or stenosis. Hemorrhage felt to be secondary to possible accelerated hypertension  Remains aphasic, follows commands  BMs x 2 yest   Review of Systems - cannot obtain secondary to severe aphasia  Objective: Vital Signs: Blood pressure 171/81, pulse 75, temperature 98.4 F (36.9 C), temperature source Oral, resp. rate 18, weight 131.5 kg (289 lb 14.5 oz), SpO2 96.00%. No results found. No results found for this or any previous visit (from the past 72 hour(s)).   HEENT: normal Cardio: RRR and no murmur Resp: CTA B/L and unlabored GI: BS positive and NT,ND Extremity:  Pulses positive and No Edema Skin:   Intact Neuro: Flat, Cranial Nerve Abnormalities R central VII, Abnormal Sensory Unable to assess secondary to aphasia, Abnormal Motor 4/5 R delt bi, tri, grip, HF, KE, ADF and Abnormal FMC Ataxic/ dec FMC Musc/Skel:  Normal Gen NAD   Assessment/Plan: 1. Functional deficits secondary to Left Thalamic ICH which require 3+ hours per day of interdisciplinary therapy in a comprehensive inpatient rehab setting. Physiatrist is providing close team supervision and 24 hour management of active medical problems listed below. Physiatrist and rehab team continue to assess barriers to discharge/monitor patient progress toward functional and medical goals. FIM: FIM - Bathing Bathing Steps Patient Completed: Chest;Right Arm;Abdomen;Right upper leg;Left upper leg;Left Arm;Front perineal area Bathing: 3: Mod-Patient completes 5-7 103f 10 parts or 50-74%  FIM - Upper Body Dressing/Undressing Upper  body dressing/undressing steps patient completed: Thread/unthread left sleeve of pullover shirt/dress;Put head through opening of pull over shirt/dress;Thread/unthread right sleeve of pullover shirt/dresss;Pull shirt over trunk Upper body dressing/undressing: 5: Set-up assist to: Obtain clothing/put away FIM - Lower Body Dressing/Undressing Lower body dressing/undressing steps patient completed: Thread/unthread left underwear leg;Pull underwear up/down;Thread/unthread right pants leg;Thread/unthread left pants leg;Pull pants up/down;Fasten/unfasten left shoe;Fasten/unfasten right shoe;Don/Doff left sock Lower body dressing/undressing: 3: Mod-Patient completed 50-74% of tasks  FIM - Toileting Toileting steps completed by patient: Performs perineal hygiene Toileting Assistive Devices: Grab bar or rail for support Toileting: 1: Total-Patient completed zero steps, helper did all 3  FIM - Radio producer Devices: Bedside commode Toilet Transfers: 3-To toilet/BSC: Mod A (lift or lower assist);3-From toilet/BSC: Mod A (lift or lower assist)  FIM - Bed/Chair Transfer Bed/Chair Transfer Assistive Devices: Arm rests;Walker Bed/Chair Transfer: 4: Bed > Chair or W/C: Min A (steadying Pt. > 75%);4: Chair or W/C > Bed: Min A (steadying Pt. > 75%);5: Supine > Sit: Supervision (verbal cues/safety issues);5: Sit > Supine: Supervision (verbal cues/safety issues)  FIM - Locomotion: Wheelchair Distance: 150 Locomotion: Wheelchair: 4: Travels 150 ft or more: maneuvers on rugs and over door sillls with minimal assistance (Pt.>75%) FIM - Locomotion: Ambulation Locomotion: Ambulation Assistive Devices: Administrator Ambulation/Gait Assistance: 4: Min guard Locomotion: Ambulation: 2: Travels 50 - 149 ft with minimal assistance (Pt.>75%)  Comprehension Comprehension Mode: Auditory Comprehension: 4-Understands basic 75 - 89% of the time/requires cueing 10 - 24% of the  time  Expression Expression Mode: Verbal Expression Assistive Devices: 6-Other (Comment) (speech clear but w/ word finding ,expressive apha) Expression: 3-Expresses basic 50 - 74% of the time/requires cueing 25 - 50% of the time. Needs to repeat  parts of sentences.  Social Interaction Social Interaction: 4-Interacts appropriately 75 - 89% of the time - Needs redirection for appropriate language or to initiate interaction.  Problem Solving Problem Solving: 3-Solves basic 50 - 74% of the time/requires cueing 25 - 49% of the time  Memory Memory: 3-Recognizes or recalls 50 - 74% of the time/requires cueing 25 - 49% of the time  Medical Problem List and Plan:  1. 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 DVT Prophylaxis/Anticoagulation: SCDs. Monitor for any signs of DVT  3. Pain Management: Tylenol as needed. Monitor with increased mobility  4. Neuropsych: This patient is not yet capable of making decisions on her own behalf.  5. Hypertension.  -remains poorly controlled  - Norvasc to 10mg  qd  - Avapro 300 mg daily, hydrochlorothiazide 12.5 mg daily, Lopressor 100 mg twice a day.Increase hydralazine to 25mg  QID   Monitor with increased mobility, titrate as needed   LOS (Days) 7 A FACE TO FACE EVALUATION WAS PERFORMED  Teasha Murrillo E 12/15/2013, 8:24 AM

## 2013-12-15 NOTE — Progress Notes (Signed)
Speech Language Pathology Daily Session Note  Patient Details  Name: Melissa [name] MRN: [id number] Date of Birth: Dec 07, 1950  Today's Date: 12/15/2013 Time: 4970-2637 Time Calculation (min): 45 min  Short Term Goals: Week 1: SLP Short Term Goal 1 (Week 1): Patient will name basic ADL objects with Mod assist semantic and phonemic cues SLP Short Term Goal 2 (Week 1): Patient will match word to object in a field of 3 with Mod assist clinician cues SLP Short Term Goal 3 (Week 1): Patient will express basic wants and needs with Max cues to utilize multi-modal communcation strategies SLP Short Term Goal 4 (Week 1): Patient will follow 2-step directions with Mod vebral and visual cues  Skilled Therapeutic Interventions: Treatment focused on cognitive-linguistic goals. SLP facilitated session with Min cues for safety awareness due to impulsivity while transferring bed to chair and then chair to recliner. Pt named common items in pictures with ~90% accuracy with Max sentence completion and phonemic cues. Pt demonstrated awareness of errors, although unable to self-correct. Pt required Total A for categorical naming task. Continue plan of care.   FIM:  Comprehension Comprehension Mode: Auditory Comprehension: 4-Understands basic 75 - 89% of the time/requires cueing 10 - 24% of the time Expression Expression Mode: Verbal Expression: 2-Expresses basic 25 - 49% of the time/requires cueing 50 - 75% of the time. Uses single words/gestures. Social Interaction Social Interaction: 4-Interacts appropriately 75 - 89% of the time - Needs redirection for appropriate language or to initiate interaction. Problem Solving Problem Solving: 3-Solves basic 50 - 74% of the time/requires cueing 25 - 49% of the time Memory Memory: 3-Recognizes or recalls 50 - 74% of the time/requires cueing 25 - 49% of the time  Pain Pain Assessment Pain Assessment: No/denies pain  Therapy/Group: Individual Therapy   Germain Osgood, M.A. CCC-SLP [phone]  Germain Osgood 12/15/2013, 10:18 AM

## 2013-12-15 NOTE — Progress Notes (Signed)
Occupational Therapy Session Note  Patient Details  Name: Melissa [name] MRN: [id number] Date of Birth: 12/13/1950  Today's Date: 12/15/2013 Time: 1100-1150 Time Calculation (min): 50 min  Short Term Goals: Week 1:  OT Short Term Goal 1 (Week 1): Pt will complete toilet transfer with mod assist OT Short Term Goal 1 - Progress (Week 1): Met OT Short Term Goal 2 (Week 1): Pt will verbally identify 50% of clothing items by name  OT Short Term Goal 2 - Progress (Week 1): Met OT Short Term Goal 3 (Week 1): Pt will complete LB dressing with max assist  OT Short Term Goal 3 - Progress (Week 1): Met OT Short Term Goal 4 (Week 1): Pt will complete shower transfer with mod assist  Skilled Therapeutic Interventions/Progress Updates:    Pt seen for ADL retraining with focus on functional transfers, problem solving, sequencing, and activity tolerance. Pt received sitting in recliner chair with husband present. Completed family education and hands on practice for toilet transfer with husband. He was able to safely return demonstration for assist with toilet transfer. Therapist marked this on safety plan. Pt engaged in bathing at shower level with min assist for washing R foot and supervision for sit<>stand for hand placement. Pt required mod gestural cues for sequencing of bathing task as she was attempting to wipe self with dry wash cloth and no water on body. Completed dressing while sitting in w/c with encouragement for identifying clothing items in sequence. Pt unable to initiate word recall and without clothing item placed in front of her. Once presented item, pt able to identify 50% of time. Pt required frequent rest breaks during dressing task secondary to fatigue. Completed sit<>stand at supervision level and carryover of reaching back for arm rest during descent. At end of session pt transferred back to recliner chair at min guard assist. Pt left with all needs in reach.    Therapy  Documentation Precautions:  Precautions Precautions: Fall Precaution Comments: Impaired motor planning RUE/LE. 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   See FIM for current functional status  Therapy/Group: Individual Therapy  Perkinson, Kayla N 12/15/2013, 11:57 AM  

## 2013-12-16 ENCOUNTER — Inpatient Hospital Stay (HOSPITAL_COMMUNITY): Payer: 59

## 2013-12-16 ENCOUNTER — Encounter (HOSPITAL_COMMUNITY): Payer: 59

## 2013-12-16 ENCOUNTER — Inpatient Hospital Stay (HOSPITAL_COMMUNITY): Payer: 59 | Admitting: Occupational Therapy

## 2013-12-16 ENCOUNTER — Inpatient Hospital Stay (HOSPITAL_COMMUNITY): Payer: 59 | Admitting: Physical Therapy

## 2013-12-16 NOTE — Progress Notes (Signed)
Occupational Therapy Session Note  Patient Details  Name: TEZRA MAHR MRN: [id number] Date of Birth: [birth year]  Today's Date: 12/16/2013 Time: 5697-9480 Time Calculation (min): 40 min  Short Term Goals: Week 2:  OT Short Term Goal 1 (Week 2): Focus on LTGs  Skilled Therapeutic Interventions/Progress Updates:  Patient resting in recliner upon arrival and son, Elenore Rota, at her side.  Engaged in caregiver education with son assisting patient with toilet transfer and toileting.  Son independently return demonstrated ability to safely assist patient using the rolling walker and staying close by.  Encouraged son to use less words, keep his phrases simple and when he finds himself repeating the same thing several times->consider another type of a cue such as demonstration.  Safety plan updated for son to independently transfer his mother with RW.  Therapy Documentation Precautions:  Precautions Precautions: Fall Precaution Comments: Impaired motor planning RUE/LE. 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

## 2013-12-16 NOTE — Progress Notes (Signed)
Subjective/Complaints: 63 y.o. right-handed female with history of hypertension. 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 CT and MRI imaging revealed acute hemorrhage centered in the left thalamus with extension into the left lateral ventricle no hydrocephalus. MRA of the head with no major occlusion or stenosis. Hemorrhage felt to be secondary to possible accelerated hypertension  Remains aphasic, follows commands  BMs x 2 yest   Review of Systems - cannot obtain secondary to severe aphasia  Objective: Vital Signs: Blood pressure 152/73, pulse 65, temperature 97.5 F (36.4 C), temperature source Oral, resp. rate 18, weight 124.8 kg (275 lb 2.2 oz), SpO2 95.00%. No results found. No results found for this or any previous visit (from the past 72 hour(s)).   HEENT: normal Cardio: RRR and no murmur Resp: CTA B/L and unlabored GI: BS positive and NT,ND Extremity:  Pulses positive and No Edema Skin:   Intact Neuro: Flat, Cranial Nerve Abnormalities R central VII, Abnormal Sensory Unable to assess secondary to aphasia, Abnormal Motor 4/5 R delt bi, tri, grip, HF, KE, ADF and Abnormal FMC Ataxic/ dec FMC Musc/Skel:  Normal Gen NAD   Assessment/Plan: 1. Functional deficits secondary to Left Thalamic ICH which require 3+ hours per day of interdisciplinary therapy in a comprehensive inpatient rehab setting. Physiatrist is providing close team supervision and 24 hour management of active medical problems listed below. Physiatrist and rehab team continue to assess barriers to discharge/monitor patient progress toward functional and medical goals. Team conference today please see physician documentation under team conference tab, met with team face-to-face to discuss problems,progress, and goals. Formulized individual treatment plan based on medical history, underlying problem and  comorbidities. FIM: FIM - Bathing Bathing Steps Patient Completed: Chest;Right Arm;Abdomen;Right upper leg;Left upper leg;Left Arm;Front perineal area;Buttocks;Left lower leg (including foot) Bathing: 4: Min-Patient completes 8-9 66f10 parts or 75+ percent  FIM - Upper Body Dressing/Undressing Upper body dressing/undressing steps patient completed: Thread/unthread left sleeve of pullover shirt/dress;Put head through opening of pull over shirt/dress;Thread/unthread right sleeve of pullover shirt/dresss;Pull shirt over trunk Upper body dressing/undressing: 5: Set-up assist to: Obtain clothing/put away FIM - Lower Body Dressing/Undressing Lower body dressing/undressing steps patient completed: Thread/unthread left underwear leg;Pull underwear up/down;Thread/unthread right pants leg;Thread/unthread left pants leg;Pull pants up/down;Fasten/unfasten left shoe;Fasten/unfasten right shoe;Don/Doff left sock;Don/Doff right sock;Don/Doff left shoe;Don/Doff right shoe Lower body dressing/undressing: 4: Min-Patient completed 75 plus % of tasks  FIM - Toileting Toileting steps completed by patient: Performs perineal hygiene Toileting Assistive Devices: Grab bar or rail for support Toileting: 1: Total-Patient completed zero steps, helper did all 3  FIM - TRadio producerDevices: WMining engineerTransfers: 4-To toilet/BSC: Min A (steadying Pt. > 75%);4-From toilet/BSC: Min A (steadying Pt. > 75%)  FIM - Bed/Chair Transfer Bed/Chair Transfer Assistive Devices: Arm rests;Walker Bed/Chair Transfer: 4: Bed > Chair or W/C: Min A (steadying Pt. > 75%);4: Chair or W/C > Bed: Min A (steadying Pt. > 75%)  FIM - Locomotion: Wheelchair Distance: 120 Locomotion: Wheelchair: 2: Travels 50 - 149 ft with minimal assistance (Pt.>75%) FIM - Locomotion: Ambulation Locomotion: Ambulation Assistive Devices: WAdministratorAmbulation/Gait Assistance: 4: Min assist Locomotion:  Ambulation: 2: Travels 50 - 149 ft with minimal assistance (Pt.>75%)  Comprehension Comprehension Mode: Auditory Comprehension: 4-Understands basic 75 - 89% of the time/requires cueing 10 - 24% of the time  Expression Expression Mode: Verbal Expression Assistive Devices: 6-Other (Comment) (speech clear but  w/ word finding ,expressive apha) Expression: 2-Expresses basic 25 - 49% of the time/requires cueing 50 - 75% of the time. Uses single words/gestures.  Social Interaction Social Interaction: 4-Interacts appropriately 75 - 89% of the time - Needs redirection for appropriate language or to initiate interaction.  Problem Solving Problem Solving: 3-Solves basic 50 - 74% of the time/requires cueing 25 - 49% of the time  Memory Memory: 3-Recognizes or recalls 50 - 74% of the time/requires cueing 25 - 49% of the time  Medical Problem List and Plan:  1. 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 DVT Prophylaxis/Anticoagulation: SCDs. Monitor for any signs of DVT  3. Pain Management: Tylenol as needed. Monitor with increased mobility  4. Neuropsych: This patient is not yet capable of making decisions on her own behalf.  5. Hypertension.  -remains poorly controlled  - Norvasc to 11m qd  - Avapro 300 mg daily, hydrochlorothiazide 12.5 mg daily, Lopressor 100 mg twice a day.Increase hydralazine to 212mQID , improving  Monitor with increased mobility, titrate as needed   LOS (Days) 8 A FACE TO FACE EVALUATION WAS PERFORMED  KIRSTEINS,ANDREW E 12/16/2013, 8:35 AM

## 2013-12-16 NOTE — Progress Notes (Addendum)
Physical Therapy Weekly Progress Note  Patient Details  Name: Melissa [name] MRN: [id number] Date of Birth: [birth year]  Today's Date: 12/16/2013 Time: 3532-9924 Time Calculation (min): 44 min  Patient has met 5 of 5 short term goals.  Pt has demonstrated marked improvements in safety/stability with bed mobility, functional transfers, w/c mobility, gait, and stair negotiation.  Patient continues to demonstrate the following deficits: decreased cardiorespiratory endurance, impaired timing and sequencing, motor apraxia, decreased coordination, R inattention, decreased motor planning, decreased problem solving, decreased safety awareness, decreased memory, delayed processing, decreased standing balance, decreased postural control and decreased balance strategies and therefore will continue to benefit from skilled PT intervention to enhance overall performance with activity tolerance, balance, postural control, ability to compensate for deficits, functional use of  right upper extremity and right lower extremity, coordination and R-sided attention.  Patient progressing toward long term goals..  Continue plan of care.  PT Short Term Goals Week 1:  PT Short Term Goal 1 (Week 1): Pt will consistently perform supine <>sit with min A with HOB flat using bed rail. PT Short Term Goal 1 - Progress (Week 1): Met PT Short Term Goal 2 (Week 1): Pt will consistently perform bed<>chair transfer with mod A. PT Short Term Goal 2 - Progress (Week 1): Met PT Short Term Goal 3 (Week 1): Pt will perform w/c mobility x50' in controlled environment with mod A, mod cueing. PT Short Term Goal 3 - Progress (Week 1): Met PT Short Term Goal 4 (Week 1): Pt will perform gait x10' in controlled environment with Total A using LRAD. PT Short Term Goal 4 - Progress (Week 1): Met PT Short Term Goal 5 (Week 1): Pt to negotiate 3 steps with bilat rails requiring +2Total A. PT Short Term Goal 5 - Progress (Week 1): Met Week 2:   PT Short Term Goal 1 (Week 2): STG's=LTG's secondary to ELOS  Skilled Therapeutic Interventions/Progress Updates:    Pt received seated in bedside chair with son present; agreeable to therapy. Session focused on the following: continued hands-on education/training with son, increasing stability/stability with gait and transfers, improving R-sided awareness with all mobility, and progressing stair negotiation. Pt performed gait 2x150, 2x175' in controlled environment with rolling walker and close supervision to min A for R-sided obstacle negotiation. PT provided assist/cueing for initial 2 trials, then son provided assist/cueing for final 2 trials. Son required mod cueing from this PT to utilize demonstration and tactile cueing, as opposed to verbal cues. Son required reinforcement throughout session.  Negotiated 5 steps with 2 hand rails and step-to pattern x3 trials. Initial 2 trials forward-facing to ascend and descend, final trial ascending backwards and descending forward to progress toward utilizing rolling walker to negotiate steps to enter home. Pt performed all 3 trials with min A, mod multimodal cueing to facilitate R foot clearance on descent. Car transfer x2 trials with rolling walker; initial trial with therapist providing min guard and multimodal cueing for safe hand placement; second trial with son providing min A, requiring mod cueing for son to cue pt on safe hand placement. Throughout session, pt with effective within-session carryover of safe hand placement during sit>stand to rolling walker; tactile cueing required for safe hand placement with stand>sit with rolling walker. Son will require reinforcement of appropriate cueing and assist with all aspects of functional mobility described above. Per son inquiry, therapist explained that PT/OT would need to provide hands-on training and determine transfers safe prior to son transferring pt in room without staff assistance.  Son verbalized  understanding. Therapist departed with pt seated in bedside chair with quick release belt on for safety, son present, and all needs within reach.   Therapy Documentation Precautions:  Precautions Precautions: Fall Precaution Comments: Impaired motor planning RUE/LE. 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

## 2013-12-16 NOTE — Progress Notes (Signed)
Social Work Patient ID: Melissa [name], female   DOB: [birth year], 63 y.o.   MRN: [id number] Met with pt and son who was here to discuss team conference progressing toward goals and moving up discharge date to 3/26, due to improvements. Son has been here and very impressed with Mom's recovering.  Aware will need OP therapies upon discharge and will discuss with dad whether here or at [facility]. Will continue to work on discharge needs.

## 2013-12-16 NOTE — Progress Notes (Signed)
Occupational Therapy Session Note  Patient Details  Name: Melissa [name] MRN: [id number] Date of Birth: 22-Apr-1951  Today's Date: 12/16/2013 Time: 1130-1200 Time Calculation (min): 30 min  Short Term Goals: Week 2:  OT Short Term Goal 1 (Week 2): Focus on LTGs  Skilled Therapeutic Interventions/Progress Updates:  ADL-retraining with focus on family education with patient's son Timmothy Sours relating to assisting his mother during ADL and functional transfers.  Patient received sitting in her recliner with son and additional relative (would not give name) attending.   Patient reported need for toileting and was escorted to bathroom by her son with OT supervising.  Patient required continuous cues and manual contact to inhibit impulsive movements during ambulation and transfers.   OT re-educated son on effects of global aphasia and on use of tactile cues combined with simple direct commands as preferred to excessive verbal cues.   Son acknowledged instruction but will require reinforcement and practice with manual contact/steadying assist to gain competence.   After toileting and transfer with min assist, patient ambulated to sink to wash her hands and to day room and back with 1 rest break.  Patient returned to recliner, safety belt fastened, and son remaining in room to assist as needed.    Therapy Documentation Precautions:  Precautions Precautions: Fall Precaution Comments: Impaired motor planning RUE/LE. 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

## 2013-12-16 NOTE — Patient Care Conference (Signed)
Inpatient RehabilitationTeam Conference and Plan of Care Update Date: 12/16/2013   Time: 11;20 AM    Patient Name: Melissa [name]      Medical Record Number: [id number]  Date of Birth: Jan 14, 1951 Sex: Female         Room/Bed: 4W06C/4W06C-01 Payor Info: Payor: [employer] / Plan: [employer] / Product Type: *No Product type* /    Admitting Diagnosis: L THALAMIC HEMORRHAGE  Admit Date/Time:  12/08/2013  5:55 PM Admission Comments: No comment available   Primary Diagnosis:  <principal problem not specified> Principal Problem: <principal problem not specified>  Patient Active Problem List   Diagnosis Date Noted  . Thalamic hemorrhage 12/08/2013  . Cerebral hemorrhage, acute 12/02/2013  . 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 ADENOCARCINOMA, ENDOMETRIUM 08/11/2007  . HYPERTHYROIDISM 05/16/2007  . HYPERTENSION 05/16/2007  . OSTEOARTHRITIS 05/16/2007    Expected Discharge Date: Expected Discharge Date: 12/24/13  Team Members Present: Physician leading conference: Dr. Alysia Penna Social Worker Present: Alfonse Alpers, LCSW;Becky Lashawn Orrego, LCSW Nurse Present: Rayetta Humphrey, RN PT Present: Billie Ruddy, PT OT Present: Gareth Morgan, Starling Manns, OT SLP Present: Germain Osgood, SLP PPS Coordinator present : Daiva Nakayama, RN, CRRN     Current Status/Progress Goal Weekly Team Focus  Medical   severe aphasia is clearing, heavy physical assist  improve communication, reduce caregiver burden  call for BR   Bowel/Bladder   Inc of bowel and bladder, LBM 12/14/13  To be continent of bowel and bladder  Timed toileting during the day    Swallow/Nutrition/ Hydration     WFL        ADL's   min assist LB dressing, bathing, and functional transfers, setup assist UB dressing, min-setup grooming  supervision overall   cognition, activity tolerance, FMC, functional transfers, family education, standning balance   Mobility   Supervision with bed mobility; Min A with  functional transfers, w/c mobility, gait, and stair negotiation  Supervision overall with exception of Min A with stairs  Activity tolerance, gait/transfer training, w/c mobility, stair negotiation, dynamic standing balance, continued hands-on family training/education   Communication   Mod-Max with confrontational naming  Min-Mod  naming, communicating wants/needs   Safety/Cognition/ Behavioral Observations  emergent awareness of speech errors with supervision  Mod (will need to upgrade)  increase self-monitoring and awareness of errors   Pain   No c/o pain  Pain <3  Assess for and treat pain as needed q shift with prn pain meds   Skin   Reddened, blanchable area to buttocks  No  skin breakdown  Assess skin  q shift for breakdown      *See Care Plan and progress notes for long and short-term goals.  Barriers to Discharge: Communication    Possible Resolutions to Barriers:  cont rehab shorten LOS mob improving    Discharge Planning/Teaching Needs:    Home with husband and son providing 24 hr care.  Here daily and participating in therapies with pt     Team Discussion:  Aphasia better, making good gains in PT & OT.  Have begun family education with husband. Will need OP therapies at DC  Revisions to Treatment Plan:  Moved up DC date   Continued Need for Acute Rehabilitation Level of Care: The patient requires daily medical management by a physician with specialized training in physical medicine and rehabilitation for the following conditions: Daily direction of a multidisciplinary physical rehabilitation program to ensure safe treatment while eliciting the highest outcome that is of  practical value to the patient.: Yes Daily medical management of patient stability for increased activity during participation in an intensive rehabilitation regime.: Yes Daily analysis of laboratory values and/or radiology reports with any subsequent need for medication adjustment of medical intervention  for : Neurological problems;Other  Elease Hashimoto 12/16/2013, 3:25 PM

## 2013-12-16 NOTE — Progress Notes (Signed)
Occupational Therapy Weekly Progress Note  Patient Details  Name: Melissa [name] MRN: [id number] Date of Birth: March 02, 1951  Today's Date: 12/16/2013 Time: 0730-0815 Time Calculation (min): 45 min  Patient has met 4 of 4 short term goals.  Patient is progressing very well in rehab. She has improved to a min assist level for functional transfers and LB dressing. Patient is limited by cognition as she currently requires min-mod cues for sequencing, problem solving, and safety awareness. Patient has improved activity tolerance slightly and requires cues for breathing techniques during bathing and dressing. Patient's husband has participated in family training at this time and plan to continue education with him and begin education with son.   Patient continues to demonstrate the following deficits: decreased activity tolerance, decreased coordination, decreased strength, decreased attention to R, overall impaired cognition, standing balance, decreased safety awareness, decreased ability to compensate for deficits and therefore will continue to benefit from skilled OT intervention to enhance overall performance with BADLs, standing balance, activity tolerance, coordination, strengthening.  Patient progressing toward long term goals..  Continue plan of care.  OT Short Term Goals Week 1:  OT Short Term Goal 1 (Week 1): Pt will complete toilet transfer with mod assist OT Short Term Goal 1 - Progress (Week 1): Met OT Short Term Goal 2 (Week 1): Pt will verbally identify 50% of clothing items by name  OT Short Term Goal 2 - Progress (Week 1): Met OT Short Term Goal 3 (Week 1): Pt will complete LB dressing with max assist  OT Short Term Goal 3 - Progress (Week 1): Met OT Short Term Goal 4 (Week 1): Pt will complete shower transfer with mod assist OT Short Term Goal 4 - Progress (Week 1): Met Week 2:  OT Short Term Goal 1 (Week 2): Focus on LTGs  Skilled Therapeutic Interventions/Progress Updates:    Pt seen for ADL retraining with focus on [facility], safety awareness, sequencing, problem solving, and activity tolerance. Pt received supine in bed and agreeable to bathing and dressing this AM. Pt transferred to w/c with min assist using RW. Pt retrieve all clothing items from w/c level with no cues and increased time. Completed bathing at sink with mod cues for sequencing and problem solving. Pt attempting to use towel as wash cloth as she placed in bucket of water and unable to self correct with questioning cues. Completed sit<>stands with supervision cues for hand placement. Pt required gestural and verbal cues for threading RLE before LLE however min carryover noted. Pt began donning shirt incorrectly and able to self-correct at Mod I level. Encouraged identifying clothing items verbally and pt with 75% accuracy. Completed oral care at sink with min verbal cues and presenting only 2 items to pt. Engaged in [employer] activity of removing beads from theraputty with increased time using R hand only. At end of session pt left sitting in recliner chair with son present and all needs in reach.   Therapy Documentation Precautions:  Precautions Precautions: Fall Precaution Comments: Impaired motor planning RUE/LE. 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); yes/no questions and demonstration most effective Restrictions Weight Bearing Restrictions: No General:   Vital Signs: Therapy Vitals Temp: 97.5 F (36.4 C) Temp src: Oral Pulse Rate: 65 Resp: 18 BP: 152/73 mmHg Patient Position, if appropriate: Lying Oxygen Therapy SpO2: 95 % O2 Device: None (Room air) Pain: No report of pain during therapy session.   See FIM for current functional status  Therapy/Group: Individual Therapy  Duayne Cal 12/16/2013, 8:20 AM

## 2013-12-16 NOTE — Progress Notes (Signed)
Speech Language Pathology Weekly Progress and Session Notes  Patient Details  Name: Melissa [name] MRN: [id number] Date of Birth: May 09, 1951  Today's Date: 12/16/2013 Time: 0600-4599 Time Calculation (min): 40 min  Short Term Goals: Week 1: SLP Short Term Goal 1 (Week 1): Patient will name basic ADL objects with Mod assist semantic and phonemic cues SLP Short Term Goal 1 - Progress (Week 1): Progressing toward goal SLP Short Term Goal 2 (Week 1): Patient will match word to object in a field of 3 with Mod assist clinician cues SLP Short Term Goal 2 - Progress (Week 1): Met SLP Short Term Goal 3 (Week 1): Patient will express basic wants and needs with Max cues to utilize multi-modal communcation strategies SLP Short Term Goal 3 - Progress (Week 1): Met SLP Short Term Goal 4 (Week 1): Patient will follow 2-step directions with Mod vebral and visual cues SLP Short Term Goal 4 - Progress (Week 1): Not met    New Short Term Goals: Week 2: SLP Short Term Goal 1 (Week 2): Patient will name basic ADL objects with Mod assist semantic and phonemic cues SLP Short Term Goal 2 (Week 2): Patient will express basic wants and needs with Mod cues to utilize multi-modal communcation strategies SLP Short Term Goal 3 (Week 2): Patient will follow 2-step directions with Mod vebral and visual cues SLP Short Term Goal 4 (Week 2): Patient will demonstrate awareness of linguistic errors with supervision level cues  Weekly Progress Updates: Pt has met 2 out of 4 STGs during this reporting period due to improvements in receptive/expressive language. Pt requires Max cues for confrontational naming and verbal expression, which primarily at the word to short phrase level. Education with pt and family is ongoing. Pt will benefit from continued SLP services to maximize functional communication.   Intensity: Minumum of 1-2 x/day, 30 to 90 minutes Frequency: 5 out of 7 days Duration/Length of Stay: 17-21  days Treatment/Interventions: Cueing hierarchy;Cognitive remediation/compensation;Environmental controls;Functional tasks;Internal/external aids;Patient/family education;Multimodal communication approach;Speech/Language facilitation;Therapeutic Activities   Daily Session Skilled Therapeutic Interventions: Treatment focused on cognitive-linguistic goals. SLP facilitated session with expressive language task. Pt required Mod-Max cues for confrontational naming of common objects in pictures, as well as Mod-Max cues to describe pictures at the word to short phrase level. Pt provided directions to/from the speech therapy office via multimodal communication with supervision level cues for expression and Mod I for route-finding. Pt's son Elenore Rota was present throughout therapy and asking questions regarding perseveration; SLP provided education.       FIM:  Comprehension Comprehension Mode: Auditory Comprehension: 3-Understands basic 50 - 74% of the time/requires cueing 25 - 50%  of the time Expression Expression Mode: Verbal Expression: 3-Expresses basic 50 - 74% of the time/requires cueing 25 - 50% of the time. Needs to repeat parts of sentences. Social Interaction Social Interaction: 4-Interacts appropriately 75 - 89% of the time - Needs redirection for appropriate language or to initiate interaction. Problem Solving Problem Solving: 3-Solves basic 50 - 74% of the time/requires cueing 25 - 49% of the time Memory Memory: 3-Recognizes or recalls 50 - 74% of the time/requires cueing 25 - 49% of the time General    Pain Pain Assessment Pain Assessment: No/denies pain  Therapy/Group: Individual Therapy   Germain Osgood, M.A. CCC-SLP [phone]  Germain Osgood 12/16/2013, 2:35 PM

## 2013-12-17 ENCOUNTER — Inpatient Hospital Stay (HOSPITAL_COMMUNITY): Payer: 59

## 2013-12-17 ENCOUNTER — Inpatient Hospital Stay (HOSPITAL_COMMUNITY): Payer: 59 | Admitting: Physical Therapy

## 2013-12-17 NOTE — Progress Notes (Signed)
Subjective/Complaints: 63 y.o. right-handed female with history of hypertension. 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 CT and MRI imaging revealed acute hemorrhage centered in the left thalamus with extension into the left lateral ventricle no hydrocephalus. MRA of the head with no major occlusion or stenosis. Hemorrhage felt to be secondary to possible accelerated hypertension  Remains aphasic, follows commands  BMs x 2 yest   Review of Systems - cannot obtain secondary to severe aphasia  Objective: Vital Signs: Blood pressure 138/75, pulse 65, temperature 98.4 F (36.9 C), temperature source Oral, resp. rate 19, weight 124.8 kg (275 lb 2.2 oz), SpO2 94.00%. No results found. No results found for this or any previous visit (from the past 72 hour(s)).   HEENT: normal Cardio: RRR and no murmur Resp: CTA B/L and unlabored GI: BS positive and NT,ND Extremity:  Pulses positive and No Edema Skin:   Intact Neuro: Flat, Cranial Nerve Abnormalities R central VII, Abnormal Sensory Unable to assess secondary to aphasia, Abnormal Motor 4/5 R delt bi, tri, grip, HF, KE, ADF and Abnormal FMC Ataxic/ dec FMC Musc/Skel:  Normal Gen NAD   Assessment/Plan: 1. Functional deficits secondary to Left Thalamic ICH which require 3+ hours per day of interdisciplinary therapy in a comprehensive inpatient rehab setting. Physiatrist is providing close team supervision and 24 hour management of active medical problems listed below. Physiatrist and rehab team continue to assess barriers to discharge/monitor patient progress toward functional and medical goals. Team conference today please see physician documentation under team conference tab, met with team face-to-face to discuss problems,progress, and goals. Formulized individual treatment plan based on medical history, underlying problem and  comorbidities. FIM: FIM - Bathing Bathing Steps Patient Completed: Chest;Right Arm;Abdomen;Right upper leg;Left upper leg;Left Arm;Front perineal area;Buttocks;Left lower leg (including foot) Bathing: 4: Min-Patient completes 8-9 76f10 parts or 75+ percent  FIM - Upper Body Dressing/Undressing Upper body dressing/undressing steps patient completed: Thread/unthread left sleeve of pullover shirt/dress;Put head through opening of pull over shirt/dress;Thread/unthread right sleeve of pullover shirt/dresss;Pull shirt over trunk Upper body dressing/undressing: 5: Supervision: Safety issues/verbal cues FIM - Lower Body Dressing/Undressing Lower body dressing/undressing steps patient completed: Thread/unthread left underwear leg;Pull underwear up/down;Thread/unthread right pants leg;Thread/unthread left pants leg;Pull pants up/down;Fasten/unfasten left shoe;Fasten/unfasten right shoe;Don/Doff left sock;Don/Doff right sock;Don/Doff left shoe;Don/Doff right shoe Lower body dressing/undressing: 4: Min-Patient completed 75 plus % of tasks  FIM - Toileting Toileting steps completed by patient: Performs perineal hygiene Toileting Assistive Devices: Grab bar or rail for support Toileting: 1: Total-Patient completed zero steps, helper did all 3  FIM - TRadio producerDevices: WMining engineerTransfers: 4-To toilet/BSC: Min A (steadying Pt. > 75%);4-From toilet/BSC: Min A (steadying Pt. > 75%)  FIM - Bed/Chair Transfer Bed/Chair Transfer Assistive Devices: Arm rests;Walker Bed/Chair Transfer: 5: Chair or W/C > Bed: Supervision (verbal cues/safety issues);4: Bed > Chair or W/C: Min A (steadying Pt. > 75%)  FIM - Locomotion: Wheelchair Distance: 120 Locomotion: Wheelchair: 2: Travels 50 - 149 ft with minimal assistance (Pt.>75%) FIM - Locomotion: Ambulation Locomotion: Ambulation Assistive Devices: WAdministratorAmbulation/Gait Assistance: 4: Min assist Locomotion:  Ambulation: 4: Travels 150 ft or more with minimal assistance (Pt.>75%)  Comprehension Comprehension Mode: Auditory Comprehension: 3-Understands basic 50 - 74% of the time/requires cueing 25 - 50%  of the time  Expression Expression Mode: Verbal Expression Assistive Devices: 6-Other (Comment) (speech clear but w/ word finding ,  expressive apha) Expression: 3-Expresses basic 50 - 74% of the time/requires cueing 25 - 50% of the time. Needs to repeat parts of sentences.  Social Interaction Social Interaction: 4-Interacts appropriately 75 - 89% of the time - Needs redirection for appropriate language or to initiate interaction.  Problem Solving Problem Solving: 3-Solves basic 50 - 74% of the time/requires cueing 25 - 49% of the time  Memory Memory: 3-Recognizes or recalls 50 - 74% of the time/requires cueing 25 - 49% of the time  Medical Problem List and Plan:  1. 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 DVT Prophylaxis/Anticoagulation: SCDs. Monitor for any signs of DVT  3. Pain Management: Tylenol as needed. Monitor with increased mobility  4. Neuropsych: This patient is not yet capable of making decisions on her own behalf.  5. Hypertension.  -remains poorly controlled  - Norvasc to 17m qd  - Avapro 300 mg daily, hydrochlorothiazide 12.5 mg daily, Lopressor 100 mg twice a day.Increase hydralazine to 284mQID , improving  Monitor with increased mobility, titrate as needed   LOS (Days) 9 A FACE TO FACE EVALUATION WAS PERFORMED  KIRSTEINS,ANDREW E 12/17/2013, 10:19 AM

## 2013-12-17 NOTE — Progress Notes (Signed)
Recreational Therapy Assessment and Plan  Patient Details  Name: Melissa [name] MRN: [id number] Date of Birth: 03/17/1951 Today's Date: 12/17/2013  Rehab Potential: Good ELOS: 2 weeks   Assessment Clinical Impression:Problem List:  Patient Active Problem List    Diagnosis  Date Noted   .  Thalamic hemorrhage  12/08/2013   .  Cerebral hemorrhage, acute  12/02/2013   .  Thalamic hemorrhage with stroke  12/02/2013   .  ADENOCARCINOMA, ENDOMETRIUM  08/11/2007   .  HYPERTHYROIDISM  05/16/2007   .  HYPERTENSION  05/16/2007   .  OSTEOARTHRITIS  05/16/2007    Past Medical History:  Past Medical History   Diagnosis  Date   .  Hypertension    .  Uterine cancer     Past Surgical History:  Past Surgical History   Procedure  Laterality  Date   .  Replacement total knee bilateral     .  Cholecystectomy     .  Abdominal hysterectomy      Assessment & Plan  Clinical Impression: Melissa [name] is a 63 y.o. right-handed female with history of hypertension. 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 CT and MRI imaging revealed acute hemorrhage centered in the left thalamus with extension into the left lateral ventricle no hydrocephalus. MRA of the head with no major occlusion or stenosis. Hemorrhage felt to be secondary to possible accelerated hypertension. TPA was not administered secondary to hemorrhage. Neurology services consulted with conservative care. Currently on a dysphagia 3 and liquid diet. Close monitoring of blood pressure. Patient transferred to CIR on 12/08/2013.   Pt presents with decreased activity tolerance, decreased functional mobility, decreased balance, decreased coordination, decreased awareness, decreased problem solving, & delayed processing limiting pt's independence with leisure/community pursuits.  Leisure History/Participation Premorbid leisure  interest/current participation: Crafts - Knitting/Crocheting;Community - Doctor, hospital - Grocery store;Community - Travel (Comment) (private lake, boating) Leisure Participation Style: With Family/Friends Awareness of Community Resources: Good-identify 3 post discharge leisure resources Psychosocial / Spiritual Does patient have pets?: No Social interaction - Mood/Behavior: Cooperative Engineer, drilling for Education?: Yes Recreational Therapy Orientation Orientation -Reviewed with patient: Available activity resources Strengths/Weaknesses Patient Strengths/Abilities: Willingness to participate;Active premorbidly Patient weaknesses: Physical limitations TR Patient demonstrates impairments in the following area(s): Endurance;Motor;Safety TR Additional Impairment(s): None  Plan Rec Therapy Plan Is patient appropriate for Therapeutic Recreation?: Yes Rehab Potential: Good Treatment times per week: Min 1 time per week >20 minutes Estimated Length of Stay: 2 weeks TR Treatment/Interventions: Adaptive equipment instruction;1:1 session;Balance/vestibular training;Functional mobility training;Community reintegration;Cognitive remediation/compensation;Patient/family education;Therapeutic activities;Recreation/leisure participation;Therapeutic exercise;UE/LE Coordination activities  Recommendations for other services: None  Discharge Criteria: Patient will be discharged from TR if patient refuses treatment 3 consecutive times without medical reason.  If treatment goals not met, if there is a change in medical status, if patient makes no progress towards goals or if patient is discharged from hospital.  The above assessment, treatment plan, treatment alternatives and goals were discussed and mutually agreed upon: by patient  [place] 12/17/2013, 2:35 PM

## 2013-12-17 NOTE — Progress Notes (Signed)
Speech Language Pathology Daily Session Note  Patient Details  Name: Melissa [name] MRN: [id number] Date of Birth: 1951/07/04  Today's Date: 12/17/2013 Time: 9485-4627 Time Calculation (min): 42 min  Short Term Goals: Week 2: SLP Short Term Goal 1 (Week 2): Patient will name basic ADL objects with Mod assist semantic and phonemic cues SLP Short Term Goal 2 (Week 2): Patient will express basic wants and needs with Mod cues to utilize multi-modal communcation strategies SLP Short Term Goal 3 (Week 2): Patient will follow 2-step directions with Mod vebral and visual cues SLP Short Term Goal 4 (Week 2): Patient will demonstrate awareness of linguistic errors with supervision level cues  Skilled Therapeutic Interventions: Treatment focused on cognitive-linguistic goals with husband present for therapy. SLP facilitated session with structured receptive/expressive language tasks. Pt required Mod-Max sentence completion cues as well as intermittent phonemic cues for confrontational naming. SLP requested that husband attempt cueing, however pt declined stating that he "already did that." Of note, SLP observed husband attempting to cue in different ways, and provided further education. Pt receptively identified pictures of common objects from a field of four with Mod cues. Continue plan of care.   FIM:  Comprehension Comprehension Mode: Auditory Comprehension: 3-Understands basic 50 - 74% of the time/requires cueing 25 - 50%  of the time Expression Expression Mode: Verbal Expression: 2-Expresses basic 25 - 49% of the time/requires cueing 50 - 75% of the time. Uses single words/gestures. Social Interaction Social Interaction: 4-Interacts appropriately 75 - 89% of the time - Needs redirection for appropriate language or to initiate interaction. Problem Solving Problem Solving: 3-Solves basic 50 - 74% of the time/requires cueing 25 - 49% of the time Memory Memory: 4-Recognizes or recalls 75 - 89%  of the time/requires cueing 10 - 24% of the time  Pain Pain Assessment Pain Assessment: No/denies pain  Therapy/Group: Individual Therapy   Germain Osgood, M.A. CCC-SLP [phone]  Germain Osgood 12/17/2013, 5:11 PM

## 2013-12-17 NOTE — Progress Notes (Signed)
Occupational Therapy Session Note  Patient Details  Name: Melissa [name] MRN: [id number] Date of Birth: Mar 16, 1951  Today's Date: 12/17/2013 Time: [id number] and 6734-1937 Time Calculation (min): 56 min and 28 min  Short Term Goals: Week 2:  OT Short Term Goal 1 (Week 2): Focus on LTGs  Skilled Therapeutic Interventions/Progress Updates:    Session 1: Pt seen for ADL retraining with focus on attention to right, functional transfers, family education, and sequencing. Pt received supine in bed with husband present. Husband assist pt to walk-in shower with min guard assist and min cues for staying closer to pt for safety. Pt ran into edge of sink on right side when ambulating into bathroom requiring min cues to attend to right side. Completed bathing with mod cues for sequencing as she attempted wash hair without wetting hair and attempting to apply soap to dry wash cloth. Pt completed sit<>stand in shower 2x with supervision. Pt required min cues for drying all body parts before ambulating to recliner chair at min guard assist. Completed dressing with emphasis on identifying clothing items and pt 75% accurate with increased time as she would self-correct. Completed oral care in standing at sink with min cues for beginning task as she reached for baby powder initially. When presented cup, toothbrush, and toothpaste pt able to complete. Also discussed presenting few items to pt at home. Also discussed using simple commands and both verbal and gestural cues. At end of session pt left sitting in recliner chair with all needs in reach.   Session 2: Pt seen for 1:1 OT session with focus on bed mobility, functional transfers, attention to right and activity tolerance. Pt ambulated room to ADL apartment at supervision level using RW and one rest break. Emphasis on attention right to locate ADL apartment with pt requiring total assist to locate. Practiced tub transfer with min guard assist into tub and  supervision out of tub. Practiced bed mobility on apartment bed completing at supervision level. Pt's husband reporting pt would need to side step to get to her side of bed. Practiced this at supervision level with mod cues for technique. Pt ambulated back to room at supervision level with one rest break. Pt required mod cues for hand placement during sit<>stands throughout session. At end of session pt left sitting in recliner chair with all needs in reach.    Therapy Documentation Precautions:  Precautions Precautions: Fall Precaution Comments: Impaired motor planning RUE/LE. 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   Other Treatments:    See FIM for current functional status  Therapy/Group: Individual Therapy  Duayne Cal 12/17/2013, 10:45 AM

## 2013-12-17 NOTE — Progress Notes (Signed)
Physical Therapy Session Note  Patient Details  Name: Melissa [name] MRN: [id number] Date of Birth: May 17, 1951  Today's Date: 12/17/2013 Time: 5631-4970 Time Calculation (min): 45 min  Short Term Goals: Week 2:  PT Short Term Goal 1 (Week 2): STG's=LTG's secondary to ELOS  Skilled Therapeutic Interventions/Progress Updates:    Initial 15 minutes individual; final 30 minutes co-tx with rec therapist. Pt received seated in bedside chair with husband present. Agreeable to therapy.Therapist educated husband on importance of setting up transfer prior to initiating transfer to avoid leaving pt standing without supervision. Explained how, despite being strong and appearing to move well, pt's impairments affect safety with functional mobility (focused on pt difficulty expressing fatigue, needing to stop). Husband verbalized understanding.   Session focused on continued hands-on family education with husband. Gait 2x150', 2x165' (seated rest breaks x2-4 minutes between each trial) in controlled environment with close supervision to min guard of husband, who provided multimodal cueing for safety awareness, decreasing speed of gait. Husband required min cueing to utilize tactile cueing with wife; husband with effective within-session carryover. Car simulator adjusted to simulate pt's car, which is 27" in height from floor to seat, per husband. Performed car transfer with min guard of husband. Husband provided appropriate assist but did require reminders for safe hand placement during transfer.  Negotiated 3 steps with with min A, R HHA of husband and L rail (to increase pt comfort); forward-facing with step-to pattern. Pt then negotiated 3 steps with L rail, forward-facing, step-to pattern with min guard to simulate negotiating steps to enter home with single HHA. For final trial, pt negotiated 3 steps with min A, L HHA, forward-facing with step-to pattern. Multimodal cueing focused on safety awareness,  decreasing speed. Pt requested to end session at this time secondary to significant fatigue. Returned to pt room, where pt was left seated in bedside chair with husband present and all needs within reach.   Therapy Documentation Precautions:  Precautions Precautions: Fall Precaution Comments: Impaired motor planning RUE/LE. 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

## 2013-12-17 NOTE — Progress Notes (Signed)
Came to visit patient on behalf of [facility] Care Management program for [facility] employees with [facility] insurance. Husband reports patient is doing well. Agreeable to follow up post hospital discharge call upon discharge. Confirmed best contact information. Left Link to Wellness packet and contact information with husband.  Marthenia Rolling, MSN- [facility] [phone]

## 2013-12-18 ENCOUNTER — Inpatient Hospital Stay (HOSPITAL_COMMUNITY): Payer: 59

## 2013-12-18 ENCOUNTER — Inpatient Hospital Stay (HOSPITAL_COMMUNITY): Payer: 59 | Admitting: Physical Therapy

## 2013-12-18 DIAGNOSIS — I619 Nontraumatic intracerebral hemorrhage, unspecified: Secondary | ICD-10-CM

## 2013-12-18 DIAGNOSIS — R1310 Dysphagia, unspecified: Secondary | ICD-10-CM

## 2013-12-18 DIAGNOSIS — R4701 Aphasia: Secondary | ICD-10-CM

## 2013-12-18 DIAGNOSIS — I10 Essential (primary) hypertension: Secondary | ICD-10-CM

## 2013-12-18 NOTE — Progress Notes (Signed)
Physical Therapy Session Note  Patient Details  Name: Melissa [name] MRN: [id number] Date of Birth: Sep 03, 1951  Today's Date: 12/18/2013 Time: [phone] and 1445-1500 (entire session from 1415-1500 (Co-tx with LP, SLP)) Time Calculation (min): 41 min and 15  min  Short Term Goals: Week 2:  PT Short Term Goal 1 (Week 2): STG's=LTG's secondary to ELOS  Skilled Therapeutic Interventions/Progress Updates:    Treatment Session 1: Pt received semi-reclined in bed with son present; agreeable to therapy. Session focused on blocked practice of supine>sit transfers, sit<>stand transfers, stand pivot transfers as well as educating son on appropriate cueing/assistance to maximize independence with functional mobility. Pt required min A, tactile cueing at bilat knees and LUE to initiate supine>sit transfer. Performed multiple sit<>stand transfers, stand-pivot transfers from bed<>chair with rolling walker and supervision. Therapist reiterated to son importance of utilizing tactile cueing to facilitate proper hand placement. Demonstration cueing also effective in promoting safe/proper transfer technique.Therapist departed with pt seated in bedside chair with all needs within reach, son in room, and RN present to administer meds.  Treatment Session 2: Skilled co-tx with primary SLP with focus on cueing to promote pt independence with functional mobility. See SLP note for detailed description of speech interventions and cueing..  Pt received semi-reclined in bed with son present; agreeable to session. Pt with report of dizziness upon sit>stand and supine>sit with HOB flat. Significant decrease in diastolic BP with sit>stand transfer;see vital signs for detailed readings. Interventions therefore focused on supine>sit with HOB elevated to 20-30 degrees to avoid symptoms of orthostasis  Pt noted to consistently perform supine>sit with reliance on abdominals for trunk flexion to achieve seated position. Pt's son  often provides UE assist for pt to achieve sitting EOB. Therefore, pt performed supine>sit with focus on movement of LUE across midline to initiate R side lying, maintaining L hand on bed to promote LUE assist. Pt demonstrated effective within-session carryover of blocked practice of transfer with manual facilitation at L biceps to initiate LUE reaching across midline. SLP assisted in reinforcing effective type and timing of cueing during bed mobility tasks. Son observed entire session and participated as appropriate. Therapists departed with pt semi-reclined in bed with son present, 3 bed rails up, bed alarm on, and all needs within reach.  Therapy Documentation Precautions:  Precautions Precautions: Fall Precaution Comments: Impaired motor planning RUE/LE. 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); yes/no questions and demonstration most effective Restrictions Weight Bearing Restrictions: No Vital Signs: Therapy Vitals Temp: 98.4 F (36.9 C) Temp src: Oral Pulse Rate: 64 Resp: 18 BP: 120/75 mmHg Oxygen Therapy SpO2: 100 % O2 Device: None (Room air) Pain: Pain Assessment Pain Assessment: 0-10 Pain Score: 4  Pain Type: Acute pain Pain Location: Knee Pain Orientation: Left Pain Descriptors / Indicators: Aching Pain Onset: With Activity Pain Intervention(s): Medication (See eMAR) Multiple Pain Sites: No  See FIM for current functional status  Therapy/Group: Individual Therapy and Co-Treatment  Blanch Stang, Malva Cogan 12/18/2013, 8:44 AM

## 2013-12-18 NOTE — Progress Notes (Signed)
Subjective/Complaints: 63 y.o. right-handed female with history of hypertension. 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 CT and MRI imaging revealed acute hemorrhage centered in the left thalamus with extension into the left lateral ventricle no hydrocephalus. MRA of the head with no major occlusion or stenosis. Hemorrhage felt to be secondary to possible accelerated hypertension  Remains aphasic, follows commands  Tolerating therapy  Review of Systems - cannot obtain secondary to severe aphasia  Objective: Vital Signs: Blood pressure 120/75, pulse 64, temperature 98.4 F (36.9 C), temperature source Oral, resp. rate 18, weight 124.8 kg (275 lb 2.2 oz), SpO2 100.00%. No results found. No results found for this or any previous visit (from the past 72 hour(s)).   HEENT: normal Cardio: RRR and no murmur Resp: CTA B/L and unlabored GI: BS positive and NT,ND Extremity:  Pulses positive and No Edema Skin:   Intact Neuro: Flat, Cranial Nerve Abnormalities R central VII, Abnormal Sensory Unable to assess secondary to aphasia, Abnormal Motor 4/5 R delt bi, tri, grip, HF, KE, ADF and Abnormal FMC Ataxic/ dec FMC Musc/Skel:  Normal Gen NAD Named pen but then perseverated  Assessment/Plan: 1. Functional deficits secondary to Left Thalamic ICH which require 3+ hours per day of interdisciplinary therapy in a comprehensive inpatient rehab setting. Physiatrist is providing close team supervision and 24 hour management of active medical problems listed below. Physiatrist and rehab team continue to assess barriers to discharge/monitor patient progress toward functional and medical goals.  FIM: FIM - Bathing Bathing Steps Patient Completed: Chest;Right Arm;Abdomen;Right upper leg;Left upper leg;Left Arm;Front perineal area;Buttocks;Left lower leg (including foot) Bathing: 4: Min-Patient  completes 8-9 11f 10 parts or 75+ percent  FIM - Upper Body Dressing/Undressing Upper body dressing/undressing steps patient completed: Thread/unthread left sleeve of pullover shirt/dress;Put head through opening of pull over shirt/dress;Thread/unthread right sleeve of pullover shirt/dresss;Pull shirt over trunk Upper body dressing/undressing: 5: Set-up assist to: Obtain clothing/put away FIM - Lower Body Dressing/Undressing Lower body dressing/undressing steps patient completed: Thread/unthread left underwear leg;Pull underwear up/down;Thread/unthread right pants leg;Thread/unthread left pants leg;Pull pants up/down;Fasten/unfasten left shoe;Fasten/unfasten right shoe;Don/Doff right sock;Don/Doff left shoe;Don/Doff right shoe;Thread/unthread right underwear leg Lower body dressing/undressing: 4: Min-Patient completed 75 plus % of tasks  FIM - Toileting Toileting steps completed by patient: Performs perineal hygiene Toileting Assistive Devices: Grab bar or rail for support Toileting: 1: Total-Patient completed zero steps, helper did all 3  FIM - Radio producer Devices: Mining engineer Transfers: 4-To toilet/BSC: Min A (steadying Pt. > 75%);4-From toilet/BSC: Min A (steadying Pt. > 75%)  FIM - Control and instrumentation engineer Devices: Walker;Arm rests Bed/Chair Transfer: 5: Supine > Sit: Supervision (verbal cues/safety issues);5: Sit > Supine: Supervision (verbal cues/safety issues)  FIM - Locomotion: Wheelchair Distance: 120 Locomotion: Wheelchair: 0: Activity did not occur FIM - Locomotion: Ambulation Locomotion: Ambulation Assistive Devices: Administrator Ambulation/Gait Assistance: 4: Min guard Locomotion: Ambulation: 4: Travels 150 ft or more with minimal assistance (Pt.>75%)  Comprehension Comprehension Mode: Auditory Comprehension: 3-Understands basic 50 - 74% of the time/requires cueing 25 - 50%  of the  time  Expression Expression Mode: Verbal Expression Assistive Devices: 6-Other (Comment) (speech clear but w/ word finding ,expressive apha) Expression: 3-Expresses basic 50 - 74% of the time/requires cueing 25 - 50% of the time. Needs to repeat parts of sentences.  Social Interaction Social Interaction: 4-Interacts appropriately 75 - 89% of the time -  Needs redirection for appropriate language or to initiate interaction.  Problem Solving Problem Solving: 3-Solves basic 50 - 74% of the time/requires cueing 25 - 49% of the time  Memory Memory: 4-Recognizes or recalls 75 - 89% of the time/requires cueing 10 - 24% of the time  Medical Problem List and Plan:  1. 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 DVT Prophylaxis/Anticoagulation: SCDs. Monitor for any signs of DVT  3. Pain Management: Tylenol as needed. Monitor with increased mobility  4. Neuropsych: This patient is not yet capable of making decisions on her own behalf.  5. Hypertension.   controlled  - Norvasc to 10mg  qd  - Avapro 300 mg daily, hydrochlorothiazide 12.5 mg daily, Lopressor 100 mg twice a day.Increase hydralazine to 25mg  QID , improving  Monitor with increased mobility, titrate as needed   LOS (Days) 10 A FACE TO FACE EVALUATION WAS PERFORMED  Melissa [name] E 12/18/2013, 8:22 AM

## 2013-12-18 NOTE — Progress Notes (Signed)
Speech Language Pathology Daily Session Notes  Patient Details  Name: Melissa [name] MRN: [id number] Date of Birth: 11/01/1950  Today's Date: 12/18/2013 Time #1: 0915-1000 Time Calculation (min): 45 min  Time #2: 4268-3419 (45 min co-tx with PT) Time Calculation (min): 30 min  Short Term Goals: Week 2: SLP Short Term Goal 1 (Week 2): Patient will name basic ADL objects with Mod assist semantic and phonemic cues SLP Short Term Goal 2 (Week 2): Patient will express basic wants and needs with Mod cues to utilize multi-modal communcation strategies SLP Short Term Goal 3 (Week 2): Patient will follow 2-step directions with Mod vebral and visual cues SLP Short Term Goal 4 (Week 2): Patient will demonstrate awareness of linguistic errors with supervision level cues  Skilled Therapeutic Interventions: Session #1: Treatment focused on cognitive-linguistic goals with son Elenore Rota present for training. SLP facilitated session with education and instruction for son regarding cueing methods. Pt completed confrontational naming task with Min cues. She completed 4-step picture sequencing with Mod faded to supervision level cues. Pt verbally described the sequence of events at the phrase level with Max cues.   Session #2: Skilled co-treatment with PT addressed cognitive-linguistic goals throughout mobility tasks. SLP facilitated session with Max faded to Dawes cues for sequencing throughout supine>sit transfers. Pt participated in confrontational naming task with son Elenore Rota providing Mod sentence completion and phonemic cues. SLP provided instruction and feedback to son regarding appropriate cueing. Pt demonstrated appropriate carryover of sequence for supine>sit transfer at end of session with supervision. Pt verbalized her needs throughout session with Min A overall. Continue plan of care.   FIM:  Comprehension Comprehension Mode: Auditory Comprehension: 4-Understands basic 75 - 89% of the time/requires  cueing 10 - 24% of the time Expression Expression Mode: Verbal Expression: 3-Expresses basic 50 - 74% of the time/requires cueing 25 - 50% of the time. Needs to repeat parts of sentences. Social Interaction Social Interaction: 4-Interacts appropriately 75 - 89% of the time - Needs redirection for appropriate language or to initiate interaction. Problem Solving Problem Solving: 3-Solves basic 50 - 74% of the time/requires cueing 25 - 49% of the time Memory Memory: 4-Recognizes or recalls 75 - 89% of the time/requires cueing 10 - 24% of the time FIM - Eating Eating Activity: 5: Supervision/cues  Pain Pain Assessment Pain Score: 0-No pain  Therapy/Group: Individual Therapy   Germain Osgood, M.A. CCC-SLP [phone]  Germain Osgood 12/18/2013, 1:17 PM

## 2013-12-18 NOTE — Progress Notes (Signed)
Occupational Therapy Session Note  Patient Details  Name: Melissa [name] MRN: [id number] Date of Birth: May 14, 1951  Today's Date: 12/18/2013 Time: 1030-1112 Time Calculation (min): 42 min  Short Term Goals: Week 2:  OT Short Term Goal 1 (Week 2): Focus on LTGs  Skilled Therapeutic Interventions/Progress Updates:    Pt seen for ADL retraining with focus on functional transfers, organization, problem solving, and standing balance. Pt received sitting in recliner chair. Ambulated to dresser to retrieve clothing items at supervision level. Pt retrieved correct clothing items without cues and placed on bed. Ambulated to bathroom. Pt completed bathing with min cues for sequencing of tasks to begin shower. Pt washed 9/10 body parts, requiring 1 verbal cues for buttocks hygiene. Completed dressing while sitting in recliner chair. Pt identifying 75% of clothing items with min phonemic cues. Pt required cues for donning RLE before LLE with no carryover when donning pants. Pt with report of dizziness during LB dressing. BP 88/56 and HR 60. RN notified and requesting to return pt to bed. Pt assisted to bed requiring min assist for sit>supine. BP 104/55 in supine. Pt left with BLE propped on pillows and all needs in reach.  Therapy Documentation Precautions:  Precautions Precautions: Fall Precaution Comments: Impaired motor planning RUE/LE. 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); yes/no questions and demonstration most effective Restrictions Weight Bearing Restrictions: No General: General Amount of Missed OT Time (min): 18 Minutes Vital Signs: Therapy Vitals Pulse Rate: 60 BP: 88/56 mmHg Patient Position, if appropriate: Sitting Pain: Pt with no report of pain during therapy session.   See FIM for current functional status  Therapy/Group: Individual Therapy  Duayne Cal 12/18/2013, 11:20 AM

## 2013-12-19 ENCOUNTER — Inpatient Hospital Stay (HOSPITAL_COMMUNITY): Payer: 59 | Admitting: Physical Therapy

## 2013-12-19 DIAGNOSIS — I619 Nontraumatic intracerebral hemorrhage, unspecified: Secondary | ICD-10-CM

## 2013-12-19 DIAGNOSIS — I10 Essential (primary) hypertension: Secondary | ICD-10-CM

## 2013-12-19 DIAGNOSIS — R4701 Aphasia: Secondary | ICD-10-CM

## 2013-12-19 DIAGNOSIS — R1310 Dysphagia, unspecified: Secondary | ICD-10-CM

## 2013-12-19 MED ORDER — HYDRALAZINE HCL 25 MG PO TABS
25.0000 mg | ORAL_TABLET | Freq: Three times a day (TID) | ORAL | Status: DC
  Administered 2013-12-19 – 2013-12-20 (×4): 25 mg via ORAL
  Filled 2013-12-19 (×9): qty 1

## 2013-12-19 NOTE — Progress Notes (Signed)
Subjective/Complaints: 63 y.o. right-handed female with history of hypertension. 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 CT and MRI imaging revealed acute hemorrhage centered in the left thalamus with extension into the left lateral ventricle no hydrocephalus. MRA of the head with no major occlusion or stenosis. Hemorrhage felt to be secondary to possible accelerated hypertension  Alert, denies any problems, pain, sob. Slept well  Review of Systems - limited due to language  Objective: Vital Signs: Blood pressure 138/70, pulse 68, temperature 97.4 F (36.3 C), temperature source Oral, resp. rate 18, weight 124.8 kg (275 lb 2.2 oz), SpO2 95.00%. No results found. No results found for this or any previous visit (from the past 72 hour(s)).   HEENT: normal Cardio: RRR and no murmur Resp: CTA B/L and unlabored GI: BS positive and NT,ND Extremity:  Pulses positive and No Edema Skin:   Intact Neuro: Flat, Cranial Nerve Abnormalities R central VII, Abnormal Sensory Unable to assess secondary to aphasia, Abnormal Motor 4/5 R delt bi, tri, grip, HF, KE, ADF and Abnormal FMC Ataxic/ dec FMC Musc/Skel:  Normal Gen NAD Named pen but then perseverated  Assessment/Plan: 1. Functional deficits secondary to Left Thalamic ICH which require 3+ hours per day of interdisciplinary therapy in a comprehensive inpatient rehab setting. Physiatrist is providing close team supervision and 24 hour management of active medical problems listed below. Physiatrist and rehab team continue to assess barriers to discharge/monitor patient progress toward functional and medical goals.  FIM: FIM - Bathing Bathing Steps Patient Completed: Chest;Right Arm;Abdomen;Right upper leg;Left upper leg;Left Arm;Front perineal area;Buttocks;Left lower leg (including foot);Right lower leg (including foot) Bathing: 5:  Supervision: Safety issues/verbal cues  FIM - Upper Body Dressing/Undressing Upper body dressing/undressing steps patient completed: Thread/unthread left sleeve of pullover shirt/dress;Put head through opening of pull over shirt/dress;Thread/unthread right sleeve of pullover shirt/dresss;Pull shirt over trunk Upper body dressing/undressing: 5: Set-up assist to: Obtain clothing/put away FIM - Lower Body Dressing/Undressing Lower body dressing/undressing steps patient completed: Thread/unthread left underwear leg;Pull underwear up/down;Thread/unthread right pants leg;Thread/unthread left pants leg;Pull pants up/down;Don/Doff right sock;Thread/unthread right underwear leg;Don/Doff left sock Lower body dressing/undressing: 5: Supervision: Safety issues/verbal cues  FIM - Toileting Toileting steps completed by patient: Adjust clothing prior to toileting;Performs perineal hygiene;Adjust clothing after toileting Toileting Assistive Devices: Grab bar or rail for support Toileting: 5: Supervision: Safety issues/verbal cues  FIM - Radio producer Devices: Mining engineer Transfers: 4-To toilet/BSC: Min A (steadying Pt. > 75%);4-From toilet/BSC: Min A (steadying Pt. > 75%)  FIM - Bed/Chair Transfer Bed/Chair Transfer Assistive Devices: Copy: 4: Supine > Sit: Min A (steadying Pt. > 75%/lift 1 leg);5: Sit > Supine: Supervision (verbal cues/safety issues);4: Bed > Chair or W/C: Min A (steadying Pt. > 75%)  FIM - Locomotion: Wheelchair Distance: 120 Locomotion: Wheelchair: 0: Activity did not occur FIM - Locomotion: Ambulation Locomotion: Ambulation Assistive Devices: Administrator Ambulation/Gait Assistance: 4: Min guard Locomotion: Ambulation: 0: Activity did not occur  Comprehension Comprehension Mode: Auditory Comprehension: 4-Understands basic 75 - 89% of the time/requires cueing 10 - 24% of the time  Expression Expression Mode:  Verbal Expression Assistive Devices: 6-Other (Comment) (speech clear but w/ word finding ,expressive apha) Expression: 3-Expresses basic 50 - 74% of the time/requires cueing 25 - 50% of the time. Needs to repeat parts of sentences.  Social Interaction Social Interaction: 4-Interacts appropriately 75 - 89% of the time -  Needs redirection for appropriate language or to initiate interaction.  Problem Solving Problem Solving: 3-Solves basic 50 - 74% of the time/requires cueing 25 - 49% of the time  Memory Memory: 4-Recognizes or recalls 75 - 89% of the time/requires cueing 10 - 24% of the time  Medical Problem List and Plan:  1. 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 DVT Prophylaxis/Anticoagulation: SCDs. Monitor for any signs of DVT  3. Pain Management: Tylenol as needed. Monitor with increased mobility  4. Neuropsych: This patient is not yet capable of making decisions on her own behalf.  5. Hypertension.   controlled---may need to back off meds a bit given hypotension - Norvasc to 10mg  qd  - Avapro 300 mg daily,   hydrochlorothiazide 12.5 mg daily, continue Lopressor 100 mg twice a day. decrease hydralazine to 25mg  tid.  Monitor with increased mobility, titrate further as needed   LOS (Days) 11 A FACE TO FACE EVALUATION WAS PERFORMED  Octa Uplinger T 12/19/2013, 8:29 AM

## 2013-12-19 NOTE — Progress Notes (Signed)
Physical Therapy Note  Patient Details  Name: Melissa [name] MRN: [id number] Date of Birth: 23-May-1951 Today's Date: 12/19/2013  1120 -1150 (30 minutes) individual Pain: no complaint of pain  Other: BP (sitting) 106/50  Focus of treatment: therapeutic exercise focused on activity tolerance ; gait training Treatment: Pt in recliner upon arrival; no C/C; gait 120 feet X 2 RW min to close SBA with one seated rest break; Nustep Level 4 X 10 minutes with no rest breaks; returned to room with all needs within reach; husband present.    Sultan Pargas,JIM 12/19/2013, 11:35 AM

## 2013-12-20 NOTE — Progress Notes (Signed)
Subjective/Complaints: 63 y.o. right-handed female with history of hypertension. 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 CT and MRI imaging revealed acute hemorrhage centered in the left thalamus with extension into the left lateral ventricle no hydrocephalus. MRA of the head with no major occlusion or stenosis. Hemorrhage felt to be secondary to possible accelerated hypertension  Slept well. Denies any problems. Husband reports she's doing well. Anxious to get home!!!  Review of Systems - limited due to language  Objective: Vital Signs: Blood pressure 109/59, pulse 59, temperature 98.2 F (36.8 C), temperature source Oral, resp. rate 18, weight 124.8 kg (275 lb 2.2 oz), SpO2 96.00%. No results found. No results found for this or any previous visit (from the past 72 hour(s)).   HEENT: normal Cardio: RRR and no murmur Resp: CTA B/L and unlabored GI: BS positive and NT,ND Extremity:  Pulses positive and No Edema Skin:   Intact Neuro: Flat, Cranial Nerve Abnormalities R central VII, Abnormal Sensory Unable to assess secondary to aphasia, Abnormal Motor 4/5 R delt bi, tri, grip, HF, KE, ADF and Abnormal FMC Ataxic/ dec FMC Musc/Skel:  Normal Gen NAD    Assessment/Plan: 1. Functional deficits secondary to Left Thalamic ICH which require 3+ hours per day of interdisciplinary therapy in a comprehensive inpatient rehab setting. Physiatrist is providing close team supervision and 24 hour management of active medical problems listed below. Physiatrist and rehab team continue to assess barriers to discharge/monitor patient progress toward functional and medical goals.  FIM: FIM - Bathing Bathing Steps Patient Completed: Chest;Right Arm;Abdomen;Right upper leg;Left upper leg;Left Arm;Front perineal area;Buttocks;Left lower leg (including foot);Right lower leg (including foot) Bathing:  5: Supervision: Safety issues/verbal cues  FIM - Upper Body Dressing/Undressing Upper body dressing/undressing steps patient completed: Thread/unthread left sleeve of pullover shirt/dress;Put head through opening of pull over shirt/dress;Thread/unthread right sleeve of pullover shirt/dresss;Pull shirt over trunk Upper body dressing/undressing: 5: Set-up assist to: Obtain clothing/put away FIM - Lower Body Dressing/Undressing Lower body dressing/undressing steps patient completed: Thread/unthread left underwear leg;Pull underwear up/down;Thread/unthread right pants leg;Thread/unthread left pants leg;Pull pants up/down;Don/Doff right sock;Thread/unthread right underwear leg;Don/Doff left sock Lower body dressing/undressing: 5: Supervision: Safety issues/verbal cues  FIM - Toileting Toileting steps completed by patient: Adjust clothing prior to toileting;Performs perineal hygiene;Adjust clothing after toileting Toileting Assistive Devices: Grab bar or rail for support Toileting: 5: Supervision: Safety issues/verbal cues  FIM - Radio producer Devices: Mining engineer Transfers: 4-To toilet/BSC: Min A (steadying Pt. > 75%);4-From toilet/BSC: Min A (steadying Pt. > 75%)  FIM - Bed/Chair Transfer Bed/Chair Transfer Assistive Devices: Copy: 4: Supine > Sit: Min A (steadying Pt. > 75%/lift 1 leg);5: Sit > Supine: Supervision (verbal cues/safety issues);4: Bed > Chair or W/C: Min A (steadying Pt. > 75%)  FIM - Locomotion: Wheelchair Distance: 120 Locomotion: Wheelchair: 0: Activity did not occur FIM - Locomotion: Ambulation Locomotion: Ambulation Assistive Devices: Administrator Ambulation/Gait Assistance: 4: Min guard Locomotion: Ambulation: 0: Activity did not occur  Comprehension Comprehension Mode: Auditory Comprehension: 4-Understands basic 75 - 89% of the time/requires cueing 10 - 24% of the time  Expression Expression Mode:  Verbal Expression Assistive Devices: 6-Other (Comment) (speech clear but w/ word finding ,expressive apha) Expression: 3-Expresses basic 50 - 74% of the time/requires cueing 25 - 50% of the time. Needs to repeat parts of sentences.  Social Interaction Social Interaction: 4-Interacts appropriately 75 - 89% of  the time - Needs redirection for appropriate language or to initiate interaction.  Problem Solving Problem Solving: 3-Solves basic 50 - 74% of the time/requires cueing 25 - 49% of the time  Memory Memory: 4-Recognizes or recalls 75 - 89% of the time/requires cueing 10 - 24% of the time  Medical Problem List and Plan:  1. 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 DVT Prophylaxis/Anticoagulation: SCDs. Monitor for any signs of DVT  3. Pain Management: Tylenol as needed. Monitor with increased mobility  4. Neuropsych: This patient is not yet capable of making decisions on her own behalf.  5. Hypertension.   controlled---may need to back off meds a bit given hypotension - Norvasc remains 10mg  qd  - Avapro 300 mg daily,   hydrochlorothiazide 12.5 mg daily, continue Lopressor 100 mg twice a day.   -decreased hydralazine to 25mg  tid on Saturday--hold there for now     LOS (Days) 12 A FACE TO FACE EVALUATION WAS PERFORMED  Huan Pollok T 12/20/2013, 8:03 AM

## 2013-12-21 ENCOUNTER — Inpatient Hospital Stay (HOSPITAL_COMMUNITY): Payer: 59

## 2013-12-21 ENCOUNTER — Encounter (HOSPITAL_COMMUNITY): Payer: 59

## 2013-12-21 ENCOUNTER — Inpatient Hospital Stay (HOSPITAL_COMMUNITY): Payer: 59 | Admitting: Physical Therapy

## 2013-12-21 DIAGNOSIS — R4701 Aphasia: Secondary | ICD-10-CM

## 2013-12-21 DIAGNOSIS — I619 Nontraumatic intracerebral hemorrhage, unspecified: Secondary | ICD-10-CM

## 2013-12-21 DIAGNOSIS — R1310 Dysphagia, unspecified: Secondary | ICD-10-CM

## 2013-12-21 DIAGNOSIS — I10 Essential (primary) hypertension: Secondary | ICD-10-CM

## 2013-12-21 MED ORDER — HYDRALAZINE HCL 10 MG PO TABS
10.0000 mg | ORAL_TABLET | Freq: Four times a day (QID) | ORAL | Status: DC
  Administered 2013-12-21 – 2013-12-24 (×8): 10 mg via ORAL
  Filled 2013-12-21 (×16): qty 1

## 2013-12-21 NOTE — Progress Notes (Signed)
Occupational Therapy Session Note  Patient Details  Name: Melissa [name] MRN: [id number] Date of Birth: [birth year]  Today's Date: 12/21/2013 Time: 1032-1129 Time Calculation (min): 57 min  Short Term Goals: Week 2:  OT Short Term Goal 1 (Week 2): Focus on LTGs  Skilled Therapeutic Interventions/Progress Updates:    Pt seen for ADL retraining with focus on activity tolerance, attention to right, functional transfers, and family education. Pt received sitting in chair with husband present. Utilized teach back method with husband for education on providing cueing. Husband demonstrated good carryover with technique with min cues from therapist throughout. Pt fatigued at one point during session and became nervous then husband with increased anxiety requiring max cues for carryover with cueing techniques.  Pt completed bathing with min cues for sequencing as she was attempting to wash body with dry wash cloth then required cues for rinsing soap off. Min cues provided to locate bar of soap on right side as well. Pt completed dressing while sitting in chair with clothing items placed on right side to facilitate attention to right. Pt required min assist for threading LE into undergarment then completed sit<>stand at supervision level. Pt with frequent rest breaks d/t fatigue. Pt completed oral care at sink while in standing. Husband demonstrating carryover of presenting only 2-3 items. Pt then ambulated to ADL apartment with 1 rest break. Husband led pt to tub shower to practiced transfer at supervision level. Simulated walk-in shower with ledge to step over then had husband assist pt. Pt completed at supervision with RW, however husband reporting RW will not fit close to shower. Educated on hand held assist and demonstrated good carryover of this technique. At end of session pt returned to room and left with all needs in reach.   Therapy Documentation Precautions:  Precautions Precautions:  Fall Precaution Comments: Impaired motor planning RUE/LE. 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

## 2013-12-21 NOTE — Progress Notes (Signed)
Physical Therapy Session Note  Patient Details  Name: Melissa [name] MRN: [id number] Date of Birth: 05/13/1951  Today's Date: 12/21/2013 Time: [phone] and 1430-1445 (Co-tx with LP (entire session from [id number])) Time Calculation (min): 40 min and 15 min (entire session 45 min)  Short Term Goals: Week 2:  PT Short Term Goal 1 (Week 2): STG's=LTG's secondary to ELOS  Skilled Therapeutic Interventions/Progress Updates:    Treatment Session 1: Pt received seated in bedside chair accompanied by husband. Pt performed gait x150' in controlled environment with rolling walker and close supervision. In gym, pt negotiated 2 stairs x4 trials, then 2 steps x2 trials after seated rest break x3 minutes. Pt negotiated steps with L rail and R HHA of husband with all trials. Husband required cueing for technique,.positioning on stairs, and effective cueing during initial 4 trials. Husband gave effective return demonstration during final 2 trials.   Therapist explained, demonstrated technique for negotiating standard curb with rolling walker. Pt then negotiated standard curb with rolling walker x2 trials with min A of husband. During initial trial, husband required cueing for positioning and to control pt's speed. Husband gave effective return demonstration of cueing during subsequent trial.  Pt continue to require cueing for safe use of rolling walker during transfers. Therefore, therapist explained, demonstrated safe technique with gait with HHA. Husband then provided min A, R HHA with gait x60' in in controlled environment with R HHA; trial ended secondary to pt fatigue. During initial trial, husband required PT tactile cueing for safe hand placement to assist pt; pt required demonstration cueing to widen BOS. Both pt and husband gave effective return demonstration of cueing during subsequent gait trial x145' in controlled environment with min A, R HHA; trial ended secondary to pt fatigue. Session ended in pt  room, where pt was left seated in bedside chair with husband present and all needs within reach.  Treatment Session 2: Skilled co-treatment with primary SLP. Session focused on family education/training, functional mobility in community setting, and utilizing appropriate cueing to maximize pt independence with speech and functional mobility. See speech therapist's note for detailed description of language interventions and cueing required during naming tasks.  Pt received seated in w/c with husband present; agreeable to session. Performed w/c mobility x75' in controlled and community environments with bilat LE's requiring min A for R-sided obstacle negotiation; demonstration cueing required to initiate bilat LE propulsion technique with effective within-session carryover. Multimodal cueing provided to promote head turning to R side to compensate for R inattention during w/c mobility. Performed gait x70' in community environment (hospital lobby) with rolling walker and min guard for safe obstacle negotiation. Sit<>stand transfers to/from w/c and dining chair with close supervision to min A; tactile cueing for safety, hand placement. Multiple seated rest breaks, per pt request, secondary to fatigue. Session ended at pt room, where pt was left seated in w/c with husband present.   Therapy Documentation Precautions:  Precautions Precautions: Fall Precaution Comments: Impaired motor planning RUE/LE. 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

## 2013-12-21 NOTE — Progress Notes (Signed)
Subjective/Complaints: 63 y.o. right-handed female with history of hypertension. 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 CT and MRI imaging revealed acute hemorrhage centered in the left thalamus with extension into the left lateral ventricle no hydrocephalus. MRA of the head with no major occlusion or stenosis. Hemorrhage felt to be secondary to possible accelerated hypertension  Pt answers "home Thursday"  Review of Systems - limited due to language  Objective: Vital Signs: Blood pressure 96/83, pulse 65, temperature 98.3 F (36.8 C), temperature source Oral, resp. rate 19, weight 124.8 kg (275 lb 2.2 oz), SpO2 94.00%. No results found. No results found for this or any previous visit (from the past 72 hour(s)).   HEENT: normal Cardio: RRR and no murmur Resp: CTA B/L and unlabored GI: BS positive and NT,ND Extremity:  Pulses positive and No Edema Skin:   Intact Neuro: Flat, Cranial Nerve Abnormalities R central VII, Abnormal Sensory Unable to assess secondary to aphasia, Abnormal Motor 4/5 R delt bi, tri, grip, HF, KE, ADF and Abnormal FMC Ataxic/ dec FMC Musc/Skel:  Normal Gen NAD    Assessment/Plan: 1. Functional deficits secondary to Left Thalamic ICH which require 3+ hours per day of interdisciplinary therapy in a comprehensive inpatient rehab setting. Physiatrist is providing close team supervision and 24 hour management of active medical problems listed below. Physiatrist and rehab team continue to assess barriers to discharge/monitor patient progress toward functional and medical goals.  FIM: FIM - Bathing Bathing Steps Patient Completed: Chest;Right Arm;Abdomen;Right upper leg;Left upper leg;Left Arm;Front perineal area;Buttocks;Left lower leg (including foot);Right lower leg (including foot) Bathing: 5: Supervision: Safety issues/verbal cues  FIM - Upper Body  Dressing/Undressing Upper body dressing/undressing steps patient completed: Thread/unthread left sleeve of pullover shirt/dress;Put head through opening of pull over shirt/dress;Thread/unthread right sleeve of pullover shirt/dresss;Pull shirt over trunk Upper body dressing/undressing: 5: Set-up assist to: Obtain clothing/put away FIM - Lower Body Dressing/Undressing Lower body dressing/undressing steps patient completed: Thread/unthread left underwear leg;Pull underwear up/down;Thread/unthread right pants leg;Thread/unthread left pants leg;Pull pants up/down;Don/Doff right sock;Thread/unthread right underwear leg;Don/Doff left sock Lower body dressing/undressing: 5: Supervision: Safety issues/verbal cues  FIM - Toileting Toileting steps completed by patient: Adjust clothing prior to toileting;Performs perineal hygiene;Adjust clothing after toileting Toileting Assistive Devices: Grab bar or rail for support Toileting: 5: Supervision: Safety issues/verbal cues  FIM - Radio producer Devices: Mining engineer Transfers: 4-To toilet/BSC: Min A (steadying Pt. > 75%);4-From toilet/BSC: Min A (steadying Pt. > 75%)  FIM - Bed/Chair Transfer Bed/Chair Transfer Assistive Devices: Copy: 4: Supine > Sit: Min A (steadying Pt. > 75%/lift 1 leg);5: Sit > Supine: Supervision (verbal cues/safety issues);4: Bed > Chair or W/C: Min A (steadying Pt. > 75%)  FIM - Locomotion: Wheelchair Distance: 120 Locomotion: Wheelchair: 0: Activity did not occur FIM - Locomotion: Ambulation Locomotion: Ambulation Assistive Devices: Administrator Ambulation/Gait Assistance: 4: Min guard Locomotion: Ambulation: 0: Activity did not occur  Comprehension Comprehension Mode: Auditory Comprehension: 4-Understands basic 75 - 89% of the time/requires cueing 10 - 24% of the time  Expression Expression Mode: Verbal Expression Assistive Devices: 6-Other (Comment)  (speech clear but w/ word finding ,expressive apha) Expression: 3-Expresses basic 50 - 74% of the time/requires cueing 25 - 50% of the time. Needs to repeat parts of sentences.  Social Interaction Social Interaction: 4-Interacts appropriately 75 - 89% of the time - Needs redirection for appropriate language or to  initiate interaction.  Problem Solving Problem Solving: 3-Solves basic 50 - 74% of the time/requires cueing 25 - 49% of the time  Memory Memory: 4-Recognizes or recalls 75 - 89% of the time/requires cueing 10 - 24% of the time  Medical Problem List and Plan:  1. 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 DVT Prophylaxis/Anticoagulation: SCDs. Monitor for any signs of DVT  3. Pain Management: Tylenol as needed. Monitor with increased mobility  4. Neuropsych: This patient is not yet capable of making decisions on her own behalf.  5. Hypertension.   controlled--- need to back off meds a bit given hypotension - Norvasc remains 10mg  qd  - Avapro 300 mg daily,   hydrochlorothiazide 12.5 mg daily, continue Lopressor 100 mg twice a day.   -decreased hydralazine to 10mg  Q6h     LOS (Days) 13 A FACE TO FACE EVALUATION WAS PERFORMED  Erling Arrazola E 12/21/2013, 7:17 AM

## 2013-12-21 NOTE — Plan of Care (Signed)
Problem: RH Tub/Shower Transfers Goal: LTG Patient will perform tub/shower transfers w/assist (OT) LTG: Patient will perform tub/shower transfers with assist, with/without cues using equipment (OT)  Downgraded to min assist as patient unable to bring RW up to shower and will be ambulating with min assist (hand held) with husband.

## 2013-12-21 NOTE — Progress Notes (Signed)
Speech Language Pathology Daily Session Notes  Patient Details  Name: Melissa [name] MRN: [id number] Date of Birth: August 25, 1951  Today's Date: 12/21/2013 Time #1: 0915-1000 Time Calculation (min): 45 min  Time #2: 1445-1515 (45 min co-tx with PT) Time Calculation (min): 30 min  Short Term Goals: Week 2: SLP Short Term Goal 1 (Week 2): Patient will name basic ADL objects with Mod assist semantic and phonemic cues SLP Short Term Goal 2 (Week 2): Patient will express basic wants and needs with Mod cues to utilize multi-modal communcation strategies SLP Short Term Goal 3 (Week 2): Patient will follow 2-step directions with Mod vebral and visual cues SLP Short Term Goal 4 (Week 2): Patient will demonstrate awareness of linguistic errors with supervision level cues  Skilled Therapeutic Interventions: Session #1: Treatment focused on cognitive-linguistic goals. SLP facilitated session with structured receptive/expressive language tasks. Pt's husband present, and SLP provided education regarding language, current level of function, and appropriate cueing technique. Pt required Min cues for sequencing of 4-step picture cards, with Mod-Max cues for word to sentence level descriptions. Husband was encouraged to utilize cueing techniques throughout session, and will require additional practice with SLP feedback in order to use appropriately. Pt exhibited spontaneous verbal expression at the phrase level to comment on SLP's earrings and to request her glasses.    Session #2: Skilled co-treatment with PT focused on family education/training and cognitive-linguistic goals throughout functional mobility tasks in a community environment. SLP facilitated session with Max cues for confrontational naming in distracting environment (hospital gift shop). SLP provided education to husband regarding appropriate cueing methods, and provided feedback throughout practice with items in gift shop. Pt required Mincues for  recall of sequence with sit<>stand transfers in distracting environment. Continue plan of care.  FIM:  Comprehension Comprehension Mode: Auditory Comprehension: 4-Understands basic 75 - 89% of the time/requires cueing 10 - 24% of the time Expression Expression Mode: Verbal Expression: 3-Expresses basic 50 - 74% of the time/requires cueing 25 - 50% of the time. Needs to repeat parts of sentences. Social Interaction Social Interaction: 4-Interacts appropriately 75 - 89% of the time - Needs redirection for appropriate language or to initiate interaction. Problem Solving Problem Solving: 3-Solves basic 50 - 74% of the time/requires cueing 25 - 49% of the time Memory Memory: 4-Recognizes or recalls 75 - 89% of the time/requires cueing 10 - 24% of the time  Pain Pain Assessment Pain Assessment: No/denies pain Pain Score: 0-No pain  Therapy/Group: Individual Therapy   Germain Osgood, M.A. CCC-SLP [phone]  Germain Osgood 12/21/2013, 11:39 AM

## 2013-12-22 ENCOUNTER — Inpatient Hospital Stay (HOSPITAL_COMMUNITY): Payer: 59

## 2013-12-22 ENCOUNTER — Inpatient Hospital Stay (HOSPITAL_COMMUNITY): Payer: 59 | Admitting: Physical Therapy

## 2013-12-22 DIAGNOSIS — R4701 Aphasia: Secondary | ICD-10-CM

## 2013-12-22 DIAGNOSIS — I619 Nontraumatic intracerebral hemorrhage, unspecified: Secondary | ICD-10-CM

## 2013-12-22 DIAGNOSIS — I10 Essential (primary) hypertension: Secondary | ICD-10-CM

## 2013-12-22 DIAGNOSIS — R1310 Dysphagia, unspecified: Secondary | ICD-10-CM

## 2013-12-22 NOTE — Progress Notes (Signed)
Physical Therapy Session Note  Patient Details  Name: Melissa [name] MRN: [id number] Date of Birth: [birth year]  Today's Date: 12/22/2013 Time: 0800-0856 Time Calculation (min): 56 min  Short Term Goals: Week 2:  PT Short Term Goal 1 (Week 2): STG's=LTG's secondary to ELOS  Skilled Therapeutic Interventions/Progress Updates:    Pt received semi-reclined in bed finishing breakfast; agreeable to session. Son present. Session focused on continued hands-on family training, increasing stability/independence with functional mobility. Supine<>sit with HOB flat, no rails, supervision. Pt performed gait x160' in controlled and home environments with rolling walker and supervision; no assist required for obstacle negotiation. Gait x90', x75' in controlled environment with min A, R HHA of son; son appropriately provided min verbal cueing for pt to decrease gait speed. Gait x50', x80' in controlled environment without assistive device, without HHA with min guard for R-sided obstacle negotiation.   Negotiated 6 steps x3 trials with seated rest breaks between each trial. Pt forward-facing with step-to pattern and no rails requiring min A, R HHA of son. Provided son with min verbal cueing for position with effective return demonstration. Pt and son both report feeling confident with stair negotiation with single HHA. Session ended in pt room, where pt was left seated in bedside chair with son present and all needs within reach.   Therapy Documentation Precautions:  Precautions Precautions: Fall Precaution Comments: Impaired motor planning RUE/LE. 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

## 2013-12-22 NOTE — Progress Notes (Signed)
Subjective/Complaints: 63 y.o. right-handed female with history of hypertension. 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 CT and MRI imaging revealed acute hemorrhage centered in the left thalamus with extension into the left lateral ventricle no hydrocephalus. MRA of the head with no major occlusion or stenosis. Hemorrhage felt to be secondary to possible accelerated hypertension  Remains aphasic just waking up  Review of Systems - limited due to language  Objective: Vital Signs: Blood pressure 141/69, pulse 72, temperature 98.2 F (36.8 C), temperature source Oral, resp. rate 18, weight 124.8 kg (275 lb 2.2 oz), SpO2 98.00%. No results found. No results found for this or any previous visit (from the past 72 hour(s)).   HEENT: normal Cardio: RRR and no murmur Resp: CTA B/L and unlabored GI: BS positive and NT,ND Extremity:  Pulses positive and No Edema Skin:   Intact Neuro: Flat, Cranial Nerve Abnormalities R central VII, Abnormal Sensory Unable to assess secondary to aphasia, Abnormal Motor 4/5 R delt bi, tri, grip, HF, KE, ADF and Abnormal FMC Ataxic/ dec FMC Musc/Skel:  Normal Gen NAD    Assessment/Plan: 1. Functional deficits secondary to Left Thalamic ICH which require 3+ hours per day of interdisciplinary therapy in a comprehensive inpatient rehab setting. Physiatrist is providing close team supervision and 24 hour management of active medical problems listed below. Physiatrist and rehab team continue to assess barriers to discharge/monitor patient progress toward functional and medical goals.  FIM: FIM - Bathing Bathing Steps Patient Completed: Chest;Right Arm;Abdomen;Right upper leg;Left upper leg;Left Arm;Front perineal area;Buttocks;Left lower leg (including foot);Right lower leg (including foot) Bathing: 5: Supervision: Safety issues/verbal cues  FIM - Upper  Body Dressing/Undressing Upper body dressing/undressing steps patient completed: Thread/unthread left sleeve of pullover shirt/dress;Put head through opening of pull over shirt/dress;Thread/unthread right sleeve of pullover shirt/dresss;Pull shirt over trunk Upper body dressing/undressing: 5: Set-up assist to: Obtain clothing/put away FIM - Lower Body Dressing/Undressing Lower body dressing/undressing steps patient completed: Pull underwear up/down;Thread/unthread right pants leg;Thread/unthread left pants leg;Pull pants up/down;Don/Doff right sock;Thread/unthread right underwear leg;Don/Doff left sock;Don/Doff left shoe;Fasten/unfasten right shoe;Don/Doff right shoe;Fasten/unfasten left shoe Lower body dressing/undressing: 4: Min-Patient completed 75 plus % of tasks  FIM - Toileting Toileting steps completed by patient: Adjust clothing prior to toileting;Performs perineal hygiene;Adjust clothing after toileting Toileting Assistive Devices: Grab bar or rail for support Toileting: 5: Supervision: Safety issues/verbal cues  FIM - Radio producer Devices: Mining engineer Transfers: 4-To toilet/BSC: Min A (steadying Pt. > 75%);4-From toilet/BSC: Min A (steadying Pt. > 75%)  FIM - Bed/Chair Transfer Bed/Chair Transfer Assistive Devices: Copy: 4: Chair or W/C > Bed: Min A (steadying Pt. > 75%);4: Bed > Chair or W/C: Min A (steadying Pt. > 75%)  FIM - Locomotion: Wheelchair Distance: 75 Locomotion: Wheelchair: 2: Travels 50 - 149 ft with minimal assistance (Pt.>75%) FIM - Locomotion: Ambulation Locomotion: Ambulation Assistive Devices: Administrator Ambulation/Gait Assistance: 4: Min guard Locomotion: Ambulation: 1: Travels less than 50 ft with minimal assistance (Pt.>75%)  Comprehension Comprehension Mode: Auditory Comprehension: 4-Understands basic 75 - 89% of the time/requires cueing 10 - 24% of the time  Expression Expression  Mode: Verbal Expression Assistive Devices: 6-Other (Comment) (speech clear but w/ word finding ,expressive apha) Expression: 3-Expresses basic 50 - 74% of the time/requires cueing 25 - 50% of the time. Needs to repeat parts of sentences.  Social Interaction Social Interaction: 4-Interacts appropriately 75 -  89% of the time - Needs redirection for appropriate language or to initiate interaction.  Problem Solving Problem Solving: 3-Solves basic 50 - 74% of the time/requires cueing 25 - 49% of the time  Memory Memory: 4-Recognizes or recalls 75 - 89% of the time/requires cueing 10 - 24% of the time  Medical Problem List and Plan:  1. 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 DVT Prophylaxis/Anticoagulation: SCDs. Monitor for any signs of DVT  3. Pain Management: Tylenol as needed. Monitor with increased mobility  4. Neuropsych: This patient is not yet capable of making decisions on her own behalf.  5. Hypertension.   controlled--- need to back off meds a bit given hypotension - Norvasc remains 10mg  qd  - Avapro 300 mg daily,   hydrochlorothiazide 12.5 mg daily, continue Lopressor 100 mg twice a day.   -decreased hydralazine to 10mg  Q6h     LOS (Days) 14 A FACE TO FACE EVALUATION WAS PERFORMED  Quinnten Calvin E 12/22/2013, 6:49 AM

## 2013-12-22 NOTE — Plan of Care (Signed)
Problem: RH BOWEL ELIMINATION Goal: RH STG MANAGE BOWEL W/MEDICATION W/ASSISTANCE STG Manage Bowel with Medication with min Assistance.  Outcome: Progressing Pt refusing laxatives/stool softener

## 2013-12-22 NOTE — Progress Notes (Signed)
Speech Language Pathology Daily Session Note  Patient Details  Name: Melissa [name] MRN: [id number] Date of Birth: 07-Apr-1951  Today's Date: 12/22/2013 Time: 6269-4854 Time Calculation (min): 41 min  Short Term Goals: Week 2: SLP Short Term Goal 1 (Week 2): Patient will name basic ADL objects with Mod assist semantic and phonemic cues SLP Short Term Goal 2 (Week 2): Patient will express basic wants and needs with Mod cues to utilize multi-modal communcation strategies SLP Short Term Goal 3 (Week 2): Patient will follow 2-step directions with Mod vebral and visual cues SLP Short Term Goal 4 (Week 2): Patient will demonstrate awareness of linguistic errors with supervision level cues  Skilled Therapeutic Interventions: Treatment focused on cognitive-linguistic goals and family training with son, Elenore Rota. SLP facilitated session with education and feedback for appropriate cueing. Pt named common items in kitchen with Mod cues from son. She followed one-step instructions throughout basic kitchen tasks with Min cues. Pt made requests throughout session with Min A for multimodal communication, using verbal expression as well as pointing and gestures. SLP reviewed recommendations with son, who verbalized his understanding of the information provided. Continue plan of care.   FIM:  Comprehension Comprehension Mode: Auditory Comprehension: 4-Understands basic 75 - 89% of the time/requires cueing 10 - 24% of the time Expression Expression Mode: Verbal Expression: 3-Expresses basic 50 - 74% of the time/requires cueing 25 - 50% of the time. Needs to repeat parts of sentences. Social Interaction Social Interaction: 4-Interacts appropriately 75 - 89% of the time - Needs redirection for appropriate language or to initiate interaction. Problem Solving Problem Solving: 3-Solves basic 50 - 74% of the time/requires cueing 25 - 49% of the time Memory Memory: 4-Recognizes or recalls 75 - 89% of the  time/requires cueing 10 - 24% of the time FIM - Eating Eating Activity: 5: Supervision/cues  Pain Pain Assessment Pain Assessment: No/denies pain  Therapy/Group: Individual Therapy   Germain Osgood, M.A. CCC-SLP [phone]  Germain Osgood 12/22/2013, 3:57 PM

## 2013-12-22 NOTE — Progress Notes (Signed)
Recreational Therapy Session Note  Patient Details  Name: ENOLIA KOEPKE MRN: [id number] Date of Birth: 1951-01-01 Today's Date: 12/22/2013  Pain: NO C/O Skilled Therapeutic Interventions/Progress Updates: Session focused on activity tolerance, dynamic standing balance, overall safety, RUE use, problem solving, receptive & expressive communication including reading written instructions to make cookies.  Pt stood at kitchen counter to prepare cookie mix according to directions.  Pt required supervision for balance & mod multimodal cues to follow written instructions. Pt required min cues for safety throughout session.  Pt son present & participatory. Therapy/Group: Co-Treatment Dreyson Mishkin 12/22/2013, 4:26 PM

## 2013-12-22 NOTE — Progress Notes (Signed)
Occupational Therapy Session Note  Patient Details  Name: Melissa [name] MRN: [id number] Date of Birth: 08-May-1951  Today's Date: 12/22/2013 Time: 8242-3536 and 1443-1540 Time calculation (min): 54 min and 40 min   Short Term Goals: Week 2:  OT Short Term Goal 1 (Week 2): Focus on LTGs  Skilled Therapeutic Interventions/Progress Updates:    Session 1: Pt seen for ADL retraining with focus on activity tolerance, attention to right, and sequencing. Pt received sitting on toilet with son present. Son provided appropriate cues for hygiene and pt able to complete task at supervision level. Son left as he will not be assisting with bathing and dressing at home. Pt ambulated to walk-in shower without AD and supervision. Completed bathing with min cues for sequencing and able to recall washing all body parts without cues. Engaged in dressing while sitting in chair. Pt required increased time to locate clothing items on right side with min cues. Pt required frequent rest breaks d/t fatigue and cues for pursed lip breathing.  Therapist placed pink stars on wall in hallway and cues pt to locate. Pt scanned to right and left to locate stars without cues. Added numbers 1-5 on stars and cued pt to locate each individual number in order. Pt with min cues to locate stars on right side and attend to items on hallway on right as she hit edge of trash can 1x. Pt took long rest break and then increased challenge of task to locate #'s in order. Pt correctly sequenced numbers and required mod cues for locating last number on right side as she walked past it 3x. Had discussion with son about inattention to right and providing close supervision when in public as pt may not attend to item on ground, etc on right side, therefore increasing fall risk. Also discussed when planning home environment to increased safety. Pt agreeable to all this and reports they are in the processing of decreasing fall hazards in home right now  (pulling up throw rugs, etc). He also reassured therapist that pt would never be left alone.  Session 2: Pt seen for 1:1 OT session with focus on safety awareness, activity tolerance, attention to right, and standing balance. Pt received sitting on toilet with son present providing assistance. Therapist provided min cues to son to simplify and decrease cueing. Pt engaged in baking task in kitchen. Pt stood at counter and retrieved cookie mix scanning left>right. Pt required mod multimodal cues to identify ingredients using pictures and words. Pt becoming overwhelmed when presented instructions on back of packet. Therapist assisted with blocking off parts of directions, allowing pt to follow them using 1 word only (stir, drop, bake). Pt completed these tasks with min-mod cues and demonstration providing for dropping cookie dough. Pt utilized right hand to hand stir ingredients together, requiring short rest break d/t fatigue. Pt required min cues for safety to not utilize handle of oven to pull up on. Pt correctly determined when cookies were baked then problem solved to allow them to cool. Encouraged pt to recall task completed to tell speech therapist in next therapy session and to finish removing cookies from pan at end of that session.   Therapy Documentation Precautions:  Precautions Precautions: Fall Precaution Comments: Impaired motor planning RUE/LE. 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   See FIM for current functional status  Therapy/Group: Individual Therapy  Melissa [name] N 12/22/2013, 10:25 AM

## 2013-12-23 ENCOUNTER — Inpatient Hospital Stay (HOSPITAL_COMMUNITY): Payer: 59 | Admitting: *Deleted

## 2013-12-23 ENCOUNTER — Inpatient Hospital Stay (HOSPITAL_COMMUNITY): Payer: 59

## 2013-12-23 ENCOUNTER — Encounter (HOSPITAL_COMMUNITY): Payer: 59

## 2013-12-23 NOTE — Progress Notes (Signed)
Occupational Therapy Discharge Summary  Patient Details  Name: Melissa [name] MRN: [id number] Date of Birth: 12/16/1950  Today's Date: 12/23/2013 Time:0832-0928 and 3790-2409 Time calculation (min): 56 min and 29 min  Patient has met 11 of 11 long term goals due to improved activity tolerance, improved balance, postural control, ability to compensate for deficits, improved attention, improved awareness and improved coordination.  Patient to discharge at overall Supervision level.  Patient's care partner is independent to provide the necessary physical and cognitive assistance at discharge.    Reasons goals not met: N/A. All LTGs met.   Recommendation:  No skilled occupational therapy recommended at this time.   Equipment: No equipment provided  Reasons for discharge: treatment goals met and discharge from hospital  Patient/family agrees with progress made and goals achieved: Yes  Skilled Therapeutic Intervention Session 1: Pt seen for ADL retraining with focus on family education, activity tolerance, FMC, standing balance. Pt received supine in bed. Therapist encouraged husband to lead ADL session and husband agreeable. Pt completed all self-care tasks and functional transfers at supervision level with rest breaks d/t fatigue. Therapist provided husband min cues to simplify commands and no ask open ended questions. Pt having difficulty with signing name. Therapist had pt practice using shorter pen and pen with wide grip tubing to facilitate normal grip and coordination. Pt and husband very pleased with signature using wide grip and therapist left with family. Pt practiced several times and became fatigued impacting accuracy of name she was writing. After rest break pt correctly signed name. At end of session pt left sitting in recliner chair with all needs in reach.   Session 2: Pt seen for 1:1 OT session with focus on family education, attention to right, and community re-entry. Pt and  husband practiced ambulating throughout gift shop with focus on safety awareness and attention to right in busy, close environment. Husband with good carryover of staying on pt's right side. Pt required long rest break before returning to rehab unit. Practiced tub transfer using TTB at supervision level. While ambulating back to room encouraged pt to locate specific items on wall to facilitate visual scanning to right. Pt required mod cues to locate items with increased time and frequent rest breaks. At end of session, therapist provided discussion with husband for final time about pt's decreased attention to right. Provided education and safety concerns about this. Husband providing excuses to cover up decreased attention. Therapist providing specific examples of decreased attention during therapy sessions and pt's husband acknowledging these however still trying to make excuses for this. Pt and husband left in room with no questions and concerns at discharge at this time.   OT Discharge Precautions/Restrictions  Precautions Precautions: Fall Precaution Comments: global aphasia (expressive>receptive) Restrictions Weight Bearing Restrictions: No General   Vital Signs Therapy Vitals Temp: 98.3 F (36.8 C) Temp src: Oral Pulse Rate: 65 Resp: 17 BP: 149/69 mmHg Patient Position, if appropriate: Lying Oxygen Therapy SpO2: 100 % O2 Device: None (Room air) Pain   ADL   Vision/Perception  Vision - History Patient Visual Report: No change from baseline Vision - Assessment Eye Alignment: Within Functional Limits Ocular Range of Motion: Within Functional Limits Alignment/Gaze Preference: Within Defined Limits Tracking/Visual Pursuits: Able to track stimulus in all quads without difficulty Visual Fields: No apparent deficits Perception Perception: Within Functional Limits Praxis Praxis: Intact  Cognition Overall Cognitive Status: Impaired/Different from baseline Arousal/Alertness:  Awake/alert Orientation Level: Oriented X4 Attention: Selective Selective Attention: Impaired Memory: Impaired Awareness: Impaired Problem  Solving: Impaired Problem Solving Impairment: Functional complex Safety/Judgment: Appears intact Sensation Sensation Light Touch: Appears Intact Hot/Cold: Appears Intact Coordination Gross Motor Movements are Fluid and Coordinated: No Fine Motor Movements are Fluid and Coordinated: Yes Motor    Mobility     Trunk/Postural Assessment     Balance   Extremity/Trunk Assessment RUE Assessment RUE Assessment: Within Functional Limits LUE Assessment LUE Assessment: Within Functional Limits  See FIM for current functional status  Deshundra Waller, Quillian Quince 12/23/2013, 9:29 AM

## 2013-12-23 NOTE — Progress Notes (Signed)
Physical Therapy Discharge Summary  Patient Details  Name: Melissa [name] MRN: [id number] Date of Birth: [birth year]  Today's Date: 12/23/2013 Time: 4782-9562 Time Calculation (min): 53 min  Patient has met 11 of 11 long term goals due to improved activity tolerance, improved balance, improved postural control, increased strength, increased range of motion, decreased pain, ability to compensate for deficits, functional use of  right upper extremity and right lower extremity and improved attention.  Patient to discharge at an ambulatory level Supervision.   Patient's care partners (husband and son) are independent to provide the necessary physical and cognitive assistance at discharge.  Reasons goals not met: N/A; all goals met.  Recommendation:  Patient will benefit from ongoing skilled PT services in outpatient setting to continue to advance safe functional mobility, address ongoing impairments in stability and independence with functional mobility and minimize fall risk.  Equipment: bariatric rolling walker  Reasons for discharge: treatment goals met and discharge from hospital  Patient/family agrees with progress made and goals achieved: Yes  Skilled Therapeutic Interventions/ Progress Updates Rec therapist present for co-tx during initial 30 minutes of session. Pt received seated in bedside chair accompanied by husband; agreeable to therapy. Session focused on completing family training and assessing/addressing stability/independence with functional mobility. See below for detailed findings of discharge evaluation. Performed multiple gait trials >200' in controlled, home, and community environments with rolling walker and supervision of husband; subtle cueing provided by husband for R-sided obstacle negotiation. Performed car transfer with supervision of husband. Negotiated 3 steps without rails, forward-facing with step-to pattern and min A (L HHA) of husband. Husband provided  safe/appropriate supervision and cueing with all described aspects of functional mobility. Performed w/c mobility x200' in controlled, home, and community environments with supervision, subtle cueing for R-sided obstacle negotiation. During seated rest breaks, patient, husband, rec therapist, and this PT engaged in conversation concerning functional expectations at discharge. Session ended in pt room, where pt was left seated in bedside chair with husband present and all needs within reach.  PT Discharge Precautions/Restrictions  Fall; R inattention with walking and w/c mobility Pain Pain Assessment Pain Assessment: No/denies pain Pain Score: 0-No pain Cognition Overall Cognitive Status: Impaired/Different from baseline Arousal/Alertness: Awake/alert Orientation Level: Oriented X4 Attention: Selective Selective Attention: Impaired Memory: Impaired Awareness: Impaired Problem Solving: Impaired Problem Solving Impairment: Functional complex Safety/Judgment: Appears intact Sensation Sensation Light Touch: Appears Intact Hot/Cold: Appears Intact Coordination Gross Motor Movements are Fluid and Coordinated: No Fine Motor Movements are Fluid and Coordinated: Yes Finger Nose Finger Test: decreased speed with RUE Heel Shin Test: Equal excursion, speed, and coordination of movement bilaterally Motor  Motor Motor: Within Functional Limits  Mobility Bed Mobility Bed Mobility: Supine to Sit;Sit to Supine;Sitting - Scoot to Edge of Bed Supine to Sit: 5: Supervision;HOB flat Supine to Sit Details: Tactile cues for initiation Sitting - Scoot to Edge of Bed: 5: Supervision Sit to Supine: 5: Supervision;HOB flat Transfers Transfers: Yes Sit to Stand: 5: Supervision Sit to Stand Details (indicate cue type and reason): with rolling walker Stand to Sit: 5: Supervision Stand to Sit Details (indicate cue type and reason): Verbal cues for precautions/safety Stand to Sit Details: with rolling  walker Stand Pivot Transfers: 5: Supervision;With armrests Stand Pivot Transfer Details (indicate cue type and reason): with rolling walker Locomotion  Ambulation Ambulation: Yes Ambulation/Gait Assistance: 5: Supervision Ambulation Distance (Feet): 250 Feet Assistive device: Rolling walker Ambulation/Gait Assistance Details: Multimodal cueing required intermittently for R-sided obstacle negotiation Gait Gait: Yes Gait Pattern: Within  Functional Limits High Level Ambulation High Level Ambulation: Side stepping Side Stepping: Mod I with rolling walker Stairs / Additional Locomotion Stairs: Yes Stairs Assistance: 5: Supervision Stair Management Technique: Two rails;Alternating pattern;Forwards Number of Stairs: 12 Ramp: 5: Supervision Curb: 5: Psychiatric nurse: Yes Wheelchair Assistance: 5: Investment banker, operational Details:  (Subtle cueing (standing on pt's R side) for R-sided obstacle negotiation) Environmental health practitioner: Both lower extermities Wheelchair Parts Management: Needs assistance Distance: 200  Trunk/Postural Assessment  Cervical Assessment Cervical Assessment: Within Functional Limits Thoracic Assessment Thoracic Assessment: Within Functional Limits Lumbar Assessment Lumbar Assessment: Within Functional Limits Postural Control Postural Control: Deficits on evaluation Trunk Control: Trunk control in sitting/standing is WFL. Righting Reactions: Effective righting reactions in seated. Not formally assessed in standing. Postural Limitations: Weightbearing symmetrical bilaterally.  Balance Balance Balance Assessed: Yes Static Sitting Balance Static Sitting - Balance Support: No upper extremity supported;Feet supported Static Sitting - Level of Assistance: 7: Independent Static Sitting - Comment/# of Minutes: Independent for stability/balance in seated; supervision secondary to cogitive impairments. Dynamic Sitting  Balance Dynamic Sitting - Balance Support: Feet supported;No upper extremity supported Dynamic Sitting - Level of Assistance: 7: Independent Sitting balance - Comments: Independent for stability/balance in seated; supervision secondary to cogitive impairments. Static Standing Balance Static Standing - Balance Support: No upper extremity supported Static Standing - Level of Assistance: 5: Stand by assistance Static Standing - Comment/# of Minutes: Static standing balance >1 minute with SBA; no overt LOB. Dynamic Standing Balance Dynamic Standing - Balance Support: During functional activity;No upper extremity supported Dynamic Standing - Level of Assistance: 5: Stand by assistance Extremity Assessment  RLE Assessment RLE Assessment: Within Functional Limits LLE Assessment LLE Assessment: Within Functional Limits  See FIM for current functional status  [place], Malva Cogan 12/23/2013, 4:37 PM

## 2013-12-23 NOTE — Discharge Summary (Signed)
NAMEMarland Kitchen  Melissa, [name] NO.:  [id number]  MEDICAL RECORD NO.:  [id number]  LOCATION:  4M06C                        FACILITY:  [facility]  PHYSICIAN:  Charlett Blake, M.D.DATE OF BIRTH:  September 15, 1951  DATE OF ADMISSION:  12/08/2013 DATE OF DISCHARGE:  12/24/2013                              DISCHARGE SUMMARY   DISCHARGE DIAGNOSES: 1. 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 2. Sequential compression devices for deep venous thrombosis     prophylaxis. 3. Hypertension. 4. Constipation, resolved.  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  CT with MRI imaging revealed acute hemorrhagic centered in the left thalamus with extension into the left lateral ventricle.  No hydrocephalus.  MRA of the head with no major occlusion or stenosis. Hemorrhage felt to be secondary to possible accelerated hypertension. TPA was not administered.  Neurology Services consulted with conservative care.  She was maintained on mechanical soft diet.  Close monitoring of blood pressure.  Physical and occupational therapy ongoing.  The patient was admitted for comprehensive rehab program.  PAST MEDICAL HISTORY:  See discharge diagnoses.  SOCIAL HISTORY:  Lives with spouse.  FUNCTIONAL HISTORY:  Prior to admission, independent, working as a Marine scientist.  FUNCTIONAL STATUS:  Upon admission to rehab services was ambulating 6 feet somewhat impulsive increased speed, needing cues for safety.  PHYSICAL EXAMINATION:  VITAL SIGNS:  Blood pressure 178/72, pulse 65, temperature 98.5, respirations 20. GENERAL:  This was an alert female, in no acute distress, needed some cues for place as well as able to provide name of her husband.  She could not tell me the date.  She was able to recall the month  given choices.  Pupils round and reactive to light. LUNGS:  Clear to auscultation. CARDIAC:  Regular rate and rhythm. ABDOMEN:  Soft, nontender.  Good bowel sounds.  REHABILITATION HOSPITAL COURSE:  The patient was admitted to inpatient rehab services with therapies initiated on a 3-hour daily basis consisting of physical therapy, occupational therapy, speech therapy, and rehabilitation nursing.  The following issues were addressed during the patient's rehabilitation stay.  Pertaining to Melissa [name] left thalamic hemorrhage secondary to her accelerated hypertension remained stable.  Close monitoring of blood pressure.  She would follow up Neurology Services as needed.  Blood pressures again monitored with Norvasc 10 mg daily, hydralazine 10 mg every 6 hours, hydrochlorothiazide 12.5 mg daily, Avapro 300 mg daily, and Lopressor 100 mg b.i.d.  She was tolerating her medications well.  We will need close follow up with her primary MD.  She had no dizziness or headache reported.  She did have some initial bouts of constipation resolved with laxative assistance.  The patient received weekly collaborative interdisciplinary team conferences to discuss estimated length of stay, family teaching, and any barriers to discharge.  She was ambulating 150 feet in a controlled environment using a rolling walker and close supervision navigating stairs with close supervision.  She continued to require some cuing for safe use of her rolling walker during her transfers.  Activities of daily living.  Function on activity tolerance, attention to the right functional transfers and ongoing family education.  The patient completed activities of daily living with minimal cues.  It was again advised need for 24-hour supervision for safety.  Speech therapy follow up for cognition linguistics with noted receptive expressive deficits. She was able to communicate basic needs and some spontaneous speech.  She  continued to progress nicely and was discharged to home.  DISCHARGE MEDICATIONS: 1. Norvasc 10 mg p.o. daily. 2. Hydralazine 10 mg p.o. every 6 hours. 3. Avapro 300 mg p.o. daily. 4. Hydrochlorothiazide 12.5 mg p.o. daily. 5. Lopressor 100 mg p.o. b.i.d. 6. Tylenol as needed.  DIET:  Regular.  FOLLOWUP:  The patient would follow with Dr. Alysia Penna at the outpatient rehab service office as directed.  Dr. Antony Contras, 1 month neurology service call for appointment.  Dr. Phoebe Sharps, medical management.  SPECIAL INSTRUCTIONS:  No driving, 24 hour supervision for safety.     Melissa [name], P.A.   ______________________________ Charlett Blake, M.D.    DA/MEDQ  D:  12/23/2013  T:  12/23/2013  Job:  [id number]  cc:   Pramod P. Leonie Man, MD Darrick Penna Swords, MD

## 2013-12-23 NOTE — Discharge Instructions (Signed)
Inpatient Rehab Discharge Instructions  [facility]ADEN Discharge date and time: No discharge date for patient encounter.   Activities/Precautions/ Functional Status: Activity: activity as tolerated Diet: regular diet Wound Care: none needed Functional status:  ___ No restrictions     ___ Walk up steps independently __x_ 24/7 supervision/assistance   ___ Walk up steps with assistance ___ Intermittent supervision/assistance  ___ Bathe/dress independently ___ Walk with walker     ___ Bathe/dress with assistance ___ Walk Independently    ___ Shower independently _x__ Walk with assistance    ___ Shower with assistance ___ No alcohol     ___ Return to work/school ________  Special Instructions:     COMMUNITY REFERRALS UPON DISCHARGE:    Outpatient: PT, OT, SPT  [place] JOACZ:[phone]  Appointment Date/Time:MARCH 30-Monday  8;30-11;00 AM  Medical Equipment/Items Ordered:NO NEEDS   GENERAL COMMUNITY RESOURCES FOR PATIENT/FAMILY: Support Groups:[facility] SUPPORT GROUP  My questions have been answered and I understand these instructions. I will adhere to these goals and the provided educational materials after my discharge from the hospital.  Patient/Caregiver Signature _______________________________ Date __________  Clinician Signature _______________________________________ Date __________  Please bring this form and your medication list with you to all your follow-up doctor's appointments.  STROKE/TIA DISCHARGE INSTRUCTIONS SMOKING Cigarette smoking nearly doubles your risk of having a stroke & is the single most alterable risk factor  If you smoke or have smoked in the last 12 months, you are advised to quit smoking for your health.  Most of the excess cardiovascular risk related to smoking disappears within a year of stopping.  Ask you doctor about anti-smoking medications  [place] Quit Line: 1-800-QUIT NOW  Free Smoking Cessation Classes [phone]  [phone]  CHOLESTEROL Know your levels; limit fat & cholesterol in your diet  Lipid Panel  No results found for this basename: chol, trig, hdl, cholhdl, vldl, ldlcalc      Many patients benefit from treatment even if their cholesterol is at goal.  Goal: Total Cholesterol (CHOL) less than 160  Goal:  Triglycerides (TRIG) less than 150  Goal:  HDL greater than 40  Goal:  LDL (LDLCALC) less than 100   BLOOD PRESSURE American Stroke Association blood pressure target is less that 120/80 mm/Hg  Your discharge blood pressure is:  BP: 141/69 mmHg  Monitor your blood pressure  Limit your salt and alcohol intake  Many individuals will require more than one medication for high blood pressure  DIABETES (A1c is a blood sugar average for last 3 months) Goal HGBA1c is under 7% (HBGA1c is blood sugar average for last 3 months)  Diabetes: No known diagnosis of diabetes    Lab Results  Component Value Date   HGBA1C 6.9* 12/03/2013     Your HGBA1c can be lowered with medications, healthy diet, and exercise.  Check your blood sugar as directed by your physician  Call your physician if you experience unexplained or low blood sugars.  PHYSICAL ACTIVITY/REHABILITATION Goal is 30 minutes at least 4 days per week  Activity: No driving, Therapies: Physical Therapy: Home Health Return to work:   Activity decreases your risk of heart attack and stroke and makes your heart stronger.  It helps control your weight and blood pressure; helps you relax and can improve your mood.  Participate in a regular exercise program.  Talk with your doctor about the best form of exercise for you (dancing, walking, swimming, cycling).  DIET/WEIGHT Goal is to maintain a healthy weight  Your discharge  diet is: General  liquids Your height is:    Your current weight is: Weight: 124.8 kg (275 lb 2.2 oz) Your Body Mass Index (BMI) is:     Following the type of diet specifically designed for you will help prevent  another stroke.  Your goal weight range is:    Your goal Body Mass Index (BMI) is 19-24.  Healthy food habits can help reduce 3 risk factors for stroke:  High cholesterol, hypertension, and excess weight.  RESOURCES Stroke/Support Group:  Call [phone]   STROKE EDUCATION PROVIDED/REVIEWED AND GIVEN TO PATIENT Stroke warning signs and symptoms How to activate emergency medical system (call 911). Medications prescribed at discharge. Need for follow-up after discharge. Personal risk factors for stroke. Pneumonia vaccine given:  Flu vaccine given:  My questions have been answered, the writing is legible, and I understand these instructions.  I will adhere to these goals & educational materials that have been provided to me after my discharge from the hospital.

## 2013-12-23 NOTE — Progress Notes (Signed)
Speech Language Pathology Discharge Summary & Final Treatment Note  Patient Details  Name: Melissa [name] MRN: [id number] Date of Birth: 1951/03/22  Today's Date: 12/23/2013 Time: 1515-1600 Time Calculation (min): 45 min  Skilled Therapeutic Interventions:  Skilled treatment focused on family training with husband present. SLP facilitated session with written and verbal feedback regarding appropriate cueing methods. Pt named common objects with Mod cues, and communicated her wants/needs via verbal expression and gestures with MIn A. Husband verbalized his understanding of all information and demonstrated his ability to provide cueing.     Patient has met 4 of 4 long term goals.  Patient to discharge at overall Mod level.  Reasons goals not met: N/A   Clinical Impression/Discharge Summary: Pt has met 4 out of 4 LTGs during this admission due to gains in receptive/expressive language, including awareness of linguistic errors. Pt required Min A for functional multimodal communication of basic wants/needs, and Mod A for more structured linguistic tasks. Education has been completed with husband and son. Pt will benefit from continued OP SLP services to maximize functional communication and independence.  Care Partner:  Caregiver Able to Provide Assistance: Yes  Type of Caregiver Assistance: Cognitive (linguistic)  Recommendation:  Outpatient SLP;24 hour supervision/assistance  Rationale for SLP Follow Up: Maximize functional communication;Maximize cognitive function and independence;Reduce caregiver burden   Equipment:   N/A  Reasons for discharge: Treatment goals met;Discharged from hospital   Patient/Family Agrees with Progress Made and Goals Achieved: Yes   See FIM for current functional status   Germain Osgood, M.A. CCC-SLP [phone]  Germain Osgood 12/23/2013, 5:06 PM

## 2013-12-23 NOTE — Progress Notes (Signed)
Social Work Patient ID: Melissa [name], female   DOB: 1950/10/30, 63 y.o.   MRN: [id number] Met with pt and husband to discuss team conference progression toward goals and discharge tomorrow.  Both feel ready for her to go home. Discussed the rental wheelchair and who is is ambulating too far for insurance coverage.  Husband reports: " We will take care of this." He feels prepared for discharge tomorrow.

## 2013-12-23 NOTE — Progress Notes (Signed)
Recreational Therapy Session Note  Patient Details  Name: Melissa [name] MRN: [id number] Date of Birth: 01/20/1951 Today's Date: 12/23/2013  Pain: no c/o Skilled Therapeutic Interventions/Progress Updates: Session focused on discharge planning in reference to use of leisure time & community reintegration.  Discussion included energy conservation techniques.  Pt & husband stated understanding. [place] 12/23/2013, 3:45 PM

## 2013-12-23 NOTE — Progress Notes (Signed)
Subjective/Complaints: 63 y.o. right-handed female with history of hypertension.. 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 CT and MRI imaging revealed acute hemorrhage centered in the left thalamus with extension into the left lateral ventricle no hydrocephalus. MRA of the head with no major occlusion or stenosis. Hemorrhage felt to be secondary to possible accelerated hypertension   Review of Systems - limited due to language  Objective: Vital Signs: Blood pressure 149/69, pulse 65, temperature 98.3 F (36.8 C), temperature source Oral, resp. rate 17, weight 123.5 kg (272 lb 4.3 oz), SpO2 100.00%. No results found. No results found for this or any previous visit (from the past 72 hour(s)).   HEENT: normal Cardio: RRR and no murmur Resp: CTA B/L and unlabored GI: BS positive and NT,ND Extremity:  Pulses positive and No Edema Skin:   Intact Neuro: Flat, Cranial Nerve Abnormalities R central VII, Abnormal Sensory Unable to assess secondary to aphasia, Abnormal Motor 4/5 R delt bi, tri, grip, HF, KE, ADF and Abnormal FMC Ataxic/ dec FMC Musc/Skel:  Normal Gen NAD    Assessment/Plan: 1. Functional deficits secondary to Left Thalamic ICH which require 3+ hours per day of interdisciplinary therapy in a comprehensive inpatient rehab setting. Physiatrist is providing close team supervision and 24 hour management of active medical problems listed below. Physiatrist and rehab team continue to assess barriers to discharge/monitor patient progress toward functional and medical goals. Team conference today please see physician documentation under team conference tab, met with team face-to-face to discuss problems,progress, and goals. Formulized individual treatment plan based on medical history, underlying problem and comorbidities.  FIM: FIM - Bathing Bathing Steps Patient Completed: Chest;Right Arm;Abdomen;Right upper leg;Left upper leg;Left Arm;Front perineal  area;Buttocks;Left lower leg (including foot);Right lower leg (including foot) Bathing: 5: Supervision: Safety issues/verbal cues (verbal cues)  FIM - Upper Body Dressing/Undressing Upper body dressing/undressing steps patient completed: Thread/unthread left sleeve of pullover shirt/dress;Put head through opening of pull over shirt/dress;Thread/unthread right sleeve of pullover shirt/dresss;Pull shirt over trunk Upper body dressing/undressing: 5: Set-up assist to: Obtain clothing/put away FIM - Lower Body Dressing/Undressing Lower body dressing/undressing steps patient completed: Pull underwear up/down;Thread/unthread right pants leg;Thread/unthread left pants leg;Pull pants up/down;Don/Doff right sock;Thread/unthread right underwear leg;Don/Doff left sock;Don/Doff left shoe;Fasten/unfasten right shoe;Don/Doff right shoe;Fasten/unfasten left shoe;Thread/unthread left underwear leg Lower body dressing/undressing: 5: Supervision: Safety issues/verbal cues (verbal cues)  FIM - Toileting Toileting steps completed by patient: Adjust clothing prior to toileting;Performs perineal hygiene;Adjust clothing after toileting Toileting Assistive Devices: Grab bar or rail for support Toileting: 5: Supervision: Safety issues/verbal cues  FIM - Radio producer Devices: Grab bars Toilet Transfers: 5-To toilet/BSC: Supervision (verbal cues/safety issues);5-From toilet/BSC: Supervision (verbal cues/safety issues)  FIM - Control and instrumentation engineer Devices: Walker;Arm rests Bed/Chair Transfer: 5: Chair or W/C > Bed: Supervision (verbal cues/safety issues);5: Bed > Chair or W/C: Supervision (verbal cues/safety issues);5: Supine > Sit: Supervision (verbal cues/safety issues);5: Sit > Supine: Supervision (verbal cues/safety issues)  FIM - Locomotion: Wheelchair Distance: 75 Locomotion: Wheelchair: 0: Activity did not occur FIM - Locomotion: Ambulation Locomotion:  Ambulation Assistive Devices: Other (comment) (No A/D) Ambulation/Gait Assistance: 4: Min guard Locomotion: Ambulation: 4: Travels 150 ft or more with minimal assistance (Pt.>75%)  Comprehension Comprehension Mode: Auditory Comprehension: 4-Understands basic 75 - 89% of the time/requires cueing 10 - 24% of the time  Expression Expression Mode: Verbal Expression Assistive Devices: 6-Other (Comment) (speech clear but w/ word finding ,expressive apha) Expression: 3-Expresses basic 50 - 74% of the time/requires cueing 25 -  50% of the time. Needs to repeat parts of sentences.  Social Interaction Social Interaction: 4-Interacts appropriately 75 - 89% of the time - Needs redirection for appropriate language or to initiate interaction.  Problem Solving Problem Solving: 3-Solves basic 50 - 74% of the time/requires cueing 25 - 49% of the time  Memory Memory: 4-Recognizes or recalls 75 - 89% of the time/requires cueing 10 - 24% of the time  Medical Problem List and Plan:  1. 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 DVT Prophylaxis/Anticoagulation: SCDs. Monitor for any signs of DVT  3. Pain Management: Tylenol as needed. Monitor with increased mobility  4. Neuropsych: This patient is not yet capable of making decisions on her own behalf.  5. Hypertension.   controlled--- need to back off meds a bit given hypotension - Norvasc remains 39m qd  - Avapro 300 mg daily,   hydrochlorothiazide 12.5 mg daily, continue Lopressor 100 mg twice a day.   -decreased hydralazine to 178mQ6h, controlled     LOS (Days) 15 A FACE TO FACE EVALUATION WAS PERFORMED  Melissa [name] E 12/23/2013, 7:52 AM

## 2013-12-23 NOTE — Discharge Summary (Signed)
Discharge summary job [phone]

## 2013-12-23 NOTE — Patient Care Conference (Signed)
Inpatient RehabilitationTeam Conference and Plan of Care Update Date: 12/23/2013   Time: 10;45 AM    Patient Name: Melissa [name]      Medical Record Number: [id number]  Date of Birth: January 18, 1951 Sex: Female         Room/Bed: 4M06C/4M06C-01 Payor Info: Payor: [employer]OY[employer] / Plan: [employer] / Product Type: *No Product type* /    Admitting Diagnosis: L THALAMIC HEMORRHAGE  Admit Date/Time:  12/08/2013  5:55 PM Admission Comments: No comment available   Primary Diagnosis:  <principal problem not specified> Principal Problem: <principal problem not specified>  Patient Active Problem List   Diagnosis Date Noted  . Thalamic hemorrhage 12/08/2013  . Cerebral hemorrhage, acute 12/02/2013  . 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 ADENOCARCINOMA, ENDOMETRIUM 08/11/2007  . HYPERTHYROIDISM 05/16/2007  . HYPERTENSION 05/16/2007  . OSTEOARTHRITIS 05/16/2007    Expected Discharge Date: Expected Discharge Date: 12/24/13  Team Members Present: Physician leading conference: Dr. Alysia Penna Social Worker Present: Ovidio Kin, LCSW;Jenny Prevatt, LCSW Nurse Present: Elliot Cousin, RN PT Present: Billie Ruddy, PT;Caroline Lacinda Axon, PT OT Present: Gareth Morgan, OT SLP Present: Germain Osgood, SLP     Current Status/Progress Goal Weekly Team Focus  Medical   aphasia improving, HTN controlled  maintain stability   D/C planning   Bowel/Bladder   continent of bladder and bowel   to remain continent of bladder and bowel  timed toileting during the day   Swallow/Nutrition/ Hydration     [facility]        ADL's   supervision overall and occasionally min assist for LB dressing; min assist for shower transfer (HHA as pt's home bathroom is too small for walker)  supervision overall   family education/training, overall cognition, dynamic standing balance, functional transfers, activity tolerance, [facility]   Mobility   Supervision overall with exception of min A with stair  negotiation, intermittent min A with gait  Supervision overall with exception of Min A with stairs  Attention to R side during gait, car/furniture transfers, functional endurance, stair negotiation, completion of hands-on family training, D/C planning    Communication   Mod cues for confrontational naming, Min A for functional communication of wants/needs  Min-Mod  at goal level, focus on education to prepare for d/c   Safety/Cognition/ Behavioral Observations  Min cues for emergent awareness of speech errors  Mod  focus on education to prepare for d/c   Pain   c/o pain  Pain<3  Assess for and treat pain q shift   Skin   No skin breakdown   No skin breadown  assess skin q shift      *See Care Plan and progress notes for long and short-term goals.  Barriers to Discharge: none/finish family training    Possible Resolutions to Barriers:  D/C in am    Discharge Planning/Teaching Needs:  Family education this week, preparing for dishcarge tomorrow.  All feel ready-pt to do OP therapies at [facility] Discussion:  Pt reaching goals for discharge.  Family education completed and husband and son to provide care.  BP looks good-aphasia improving  Revisions to Treatment Plan:  Ready for discharge tomorrow   Continued Need for Acute Rehabilitation Level of Care: The patient requires daily medical management by a physician with specialized training in physical medicine and rehabilitation for the following conditions: Daily direction of a multidisciplinary physical rehabilitation program to ensure safe treatment while eliciting the highest outcome that is of practical  value to the patient.: Yes Daily medical management of patient stability for increased activity during participation in an intensive rehabilitation regime.: Yes Daily analysis of laboratory values and/or radiology reports with any subsequent need for medication adjustment of medical intervention for : Neurological  problems  Latora Quarry, Gardiner Rhyme 12/23/2013, 1:39 PM

## 2013-12-24 DIAGNOSIS — R4701 Aphasia: Secondary | ICD-10-CM

## 2013-12-24 DIAGNOSIS — I10 Essential (primary) hypertension: Secondary | ICD-10-CM

## 2013-12-24 DIAGNOSIS — R1310 Dysphagia, unspecified: Secondary | ICD-10-CM

## 2013-12-24 DIAGNOSIS — I619 Nontraumatic intracerebral hemorrhage, unspecified: Secondary | ICD-10-CM

## 2013-12-24 MED ORDER — METOPROLOL TARTRATE 100 MG PO TABS: 100.0000 mg | ORAL_TABLET | Freq: Two times a day (BID) | ORAL | Status: DC

## 2013-12-24 MED ORDER — HYDROCHLOROTHIAZIDE 12.5 MG PO CAPS: 12.5000 mg | ORAL_CAPSULE | Freq: Every day | ORAL | Status: DC

## 2013-12-24 MED ORDER — IRBESARTAN 300 MG PO TABS: 300.0000 mg | ORAL_TABLET | Freq: Every day | ORAL | Status: DC

## 2013-12-24 MED ORDER — AMLODIPINE BESYLATE 10 MG PO TABS: 10.0000 mg | ORAL_TABLET | Freq: Every day | ORAL | Status: DC

## 2013-12-24 MED ORDER — HYDRALAZINE HCL 10 MG PO TABS: 10.0000 mg | ORAL_TABLET | Freq: Four times a day (QID) | ORAL | Status: DC

## 2013-12-24 NOTE — Progress Notes (Signed)
Social Work Discharge Note Discharge Note  The overall goal for the admission was met for:   Discharge location: [place] [facility]  Length of Stay: Yes-16 DAYS  Discharge activity level: Yes-SUPERVISON/MIN LEVEL  Home/community participation: Yes  Services provided included: MD, RD, PT, OT, SLP, RN, CM, TR, Pharmacy and SW  Financial Services: Private Insurance: UMR  Follow-up services arranged: Outpatient: [facility]-PT,OT,SPT 3/30 8;30-11;00  Comments (or additional information):HUSBAND AND SON EDUCATED REGARDING PT'S CARE-ALL COMFORTABLE WITH DISHCARGE  Patient/Family verbalized understanding of follow-up arrangements: Yes  Individual responsible for coordination of the follow-up plan: [name]-HUSBAND  Confirmed correct DME delivered: Elease Hashimoto 12/24/2013    Elease Hashimoto

## 2013-12-24 NOTE — Progress Notes (Signed)
Pt discharged to home accompanied by her husband

## 2013-12-24 NOTE — Progress Notes (Signed)
Recreational Therapy Discharge Summary Patient Details  Name: Melissa [name] MRN: [id number] Date of Birth: [birth year] Today's Date: 12/24/2013  Long term goals set: 1  Long term goals met: 1  Comments on progress toward goals: pt has made great progress toward goal and is ready for discharge home today with family to provide 24 hour supervision.  Family has been participatory in therapies and discharge planning in regards to leisure pursuits & community reintegration.  Reasons for discharge: discharge from hospital Patient/family agrees with progress made and goals achieved: Yes  Elsie Baynes 12/24/2013, 8:51 AM

## 2013-12-24 NOTE — Progress Notes (Signed)
Subjective/Complaints: 63 y.o. right-handed female with history of hypertension.. 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 CT and MRI imaging revealed acute hemorrhage centered in the left thalamus with extension into the left lateral ventricle no hydrocephalus. MRA of the head with no major occlusion or stenosis. Hemorrhage felt to be secondary to possible accelerated hypertension   Review of Systems - limited due to language  Objective: Vital Signs: Blood pressure 144/74, pulse 72, temperature 98.4 F (36.9 C), temperature source Oral, resp. rate 19, weight 123.5 kg (272 lb 4.3 oz), SpO2 96.00%. No results found. No results found for this or any previous visit (from the past 72 hour(s)).   HEENT: normal Cardio: RRR and no murmur Resp: CTA B/L and unlabored GI: BS positive and NT,ND Extremity:  Pulses positive and No Edema Skin:   Intact Neuro: Flat, Cranial Nerve Abnormalities R central VII, Abnormal Sensory Unable to assess secondary to aphasia, Abnormal Motor 4/5 R delt bi, tri, grip, HF, KE, ADF and Abnormal FMC Ataxic/ dec FMC Musc/Skel:  Normal Gen NAD    Assessment/Plan: 1. Functional deficits secondary to Left Thalamic ICH which require 3+ hours per day of interdisciplinary therapy in a comprehensive inpatient rehab setting. Stable for D/C today F/u PCP in 1-2 weeks F/u PM&R 3 weeks See D/C summary See D/C instructions  FIM: FIM - Bathing Bathing Steps Patient Completed: Chest;Right Arm;Abdomen;Right upper leg;Left upper leg;Left Arm;Front perineal area;Buttocks;Left lower leg (including foot);Right lower leg (including foot) Bathing: 5: Supervision: Safety issues/verbal cues  FIM - Upper Body Dressing/Undressing Upper body dressing/undressing steps patient completed: Thread/unthread left sleeve of pullover shirt/dress;Put head through opening of pull over shirt/dress;Thread/unthread right sleeve of pullover shirt/dresss;Pull shirt over trunk Upper  body dressing/undressing: 5: Set-up assist to: Obtain clothing/put away FIM - Lower Body Dressing/Undressing Lower body dressing/undressing steps patient completed: Pull underwear up/down;Thread/unthread right pants leg;Thread/unthread left pants leg;Pull pants up/down;Don/Doff right sock;Thread/unthread right underwear leg;Don/Doff left sock;Don/Doff left shoe;Fasten/unfasten right shoe;Don/Doff right shoe;Fasten/unfasten left shoe;Thread/unthread left underwear leg Lower body dressing/undressing: 5: Supervision: Safety issues/verbal cues  FIM - Toileting Toileting steps completed by patient: Adjust clothing after toileting;Performs perineal hygiene;Adjust clothing prior to toileting Toileting Assistive Devices: Grab bar or rail for support Toileting: 5: Supervision: Safety issues/verbal cues  FIM - Radio producer Devices: Grab bars Toilet Transfers: 5-To toilet/BSC: Supervision (verbal cues/safety issues);5-From toilet/BSC: Supervision (verbal cues/safety issues)  FIM - Control and instrumentation engineer Devices: Walker;Arm rests Bed/Chair Transfer: 5: Chair or W/C > Bed: Supervision (verbal cues/safety issues);5: Bed > Chair or W/C: Supervision (verbal cues/safety issues);5: Supine > Sit: Supervision (verbal cues/safety issues);5: Sit > Supine: Supervision (verbal cues/safety issues)  FIM - Locomotion: Wheelchair Distance: 200 Locomotion: Wheelchair: 5: Travels 150 ft or more: maneuvers on rugs and over door sills with supervision, cueing or coaxing FIM - Locomotion: Ambulation Locomotion: Ambulation Assistive Devices: Administrator Ambulation/Gait Assistance: 5: Supervision Locomotion: Ambulation: 5: Travels 150 ft or more with supervision/safety issues  Comprehension Comprehension Mode: Auditory Comprehension: 4-Understands basic 75 - 89% of the time/requires cueing 10 - 24% of the time  Expression Expression Mode: Verbal Expression  Assistive Devices: 6-Other (Comment) (speech clear but w/ word finding ,expressive apha) Expression: 3-Expresses basic 50 - 74% of the time/requires cueing 25 - 50% of the time. Needs to repeat parts of sentences.  Social Interaction Social Interaction: 4-Interacts appropriately 75 - 89% of the time - Needs redirection for appropriate language or to initiate interaction.  Problem Solving Problem Solving: 3-Solves basic  50 - 74% of the time/requires cueing 25 - 49% of the time  Memory Memory: 4-Recognizes or recalls 75 - 89% of the time/requires cueing 10 - 24% of the time  Medical Problem List and Plan:  1. 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 DVT Prophylaxis/Anticoagulation: SCDs. Monitor for any signs of DVT  3. Pain Management: Tylenol as needed. Monitor with increased mobility  4. Neuropsych: This patient is not yet capable of making decisions on her own behalf.  5. Hypertension.   controlled--- cont current meds - Norvasc remains 10mg  qd  - Avapro 300 mg daily,   hydrochlorothiazide 12.5 mg daily, continue Lopressor 100 mg twice a day.   -decreased hydralazine to 10mg  Q6h, controlled     LOS (Days) 16 A FACE TO FACE EVALUATION WAS PERFORMED  Hever Castilleja E 12/24/2013, 6:54 AM

## 2013-12-28 ENCOUNTER — Ambulatory Visit (HOSPITAL_COMMUNITY)
Admit: 2013-12-28 | Discharge: 2013-12-28 | Disposition: A | Discharge status: Home / Self Care | Payer: 59 | Attending: Physical Medicine & Rehabilitation | Admitting: Physical Medicine & Rehabilitation

## 2013-12-28 ENCOUNTER — Ambulatory Visit (HOSPITAL_COMMUNITY)
Admission: RE | Admit: 2013-12-28 | Discharge: 2013-12-28 | Disposition: A | Discharge status: Home / Self Care | Payer: 59 | Source: Ambulatory Visit | Attending: Physical Medicine & Rehabilitation | Admitting: Physical Medicine & Rehabilitation

## 2013-12-28 DIAGNOSIS — IMO0002 Reserved for concepts with insufficient information to code with codable children: Secondary | ICD-10-CM

## 2013-12-28 DIAGNOSIS — I69398 Other sequelae of cerebral infarction: Secondary | ICD-10-CM

## 2013-12-28 DIAGNOSIS — R29898 Other symptoms and signs involving the musculoskeletal system: Secondary | ICD-10-CM

## 2013-12-28 DIAGNOSIS — M6281 Muscle weakness (generalized): Secondary | ICD-10-CM | POA: Insufficient documentation

## 2013-12-28 DIAGNOSIS — IMO0001 Reserved for inherently not codable concepts without codable children: Secondary | ICD-10-CM | POA: Insufficient documentation

## 2013-12-28 DIAGNOSIS — R279 Unspecified lack of coordination: Secondary | ICD-10-CM

## 2013-12-28 DIAGNOSIS — R2689 Other abnormalities of gait and mobility: Secondary | ICD-10-CM

## 2013-12-28 DIAGNOSIS — I10 Essential (primary) hypertension: Secondary | ICD-10-CM | POA: Insufficient documentation

## 2013-12-28 DIAGNOSIS — R262 Difficulty in walking, not elsewhere classified: Secondary | ICD-10-CM | POA: Insufficient documentation

## 2013-12-28 DIAGNOSIS — I69998 Other sequelae following unspecified cerebrovascular disease: Secondary | ICD-10-CM | POA: Insufficient documentation

## 2013-12-28 NOTE — Evaluation (Signed)
Physical Therapy Evaluation  Patient Details  Name: Melissa [name] MRN: [id number] Date of Birth: Mar 12, 1951  Today's Date: 12/28/2013 Time: 5361-4431 PT Time Calculation (min): 45 min Charge:  evaluation             Visit#: 1 of 12  Re-eval: 01/27/14 Assessment Diagnosis: Melissa [name] Next MD Visit: 01/18/2014 Prior Therapy: rehab.  Authorization: [employer]    Past Medical History:  Past Medical History  Diagnosis Date  . Hypertension   . Uterine cancer    Past Surgical History:  Past Surgical History  Procedure Laterality Date  . Replacement total knee bilateral    . Cholecystectomy    . Abdominal hysterectomy      Subjective Symptoms/Limitations Symptoms: Pt states since she has been home she has had the most difficulty with walking, activity tolerance.  Melissa [name] states the most difficult aspect seems to be her speech.  She is not able to dress herself.  The only housework she has attempted is folding clothes at this time.  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  Pt was transferred to the rehab floor on 12/08/2013 and discharged on 12/24/2013.  B TKR in the past How long can you sit comfortably?: no problem How long can you stand comfortably?: Able to stand for five minutes.  How long can you walk comfortably?: She is not using an assistive device at home.  The longest she has walked is less than five minutes. Pain Assessment Currently in Pain?: No/denies     Balance Screening  No falls  Prior Functioning  Prior Function Vocation: Full time employment Vocation Requirements: nurse standing Leisure: Hobbies-yes (Comment) Comments: crotch and knitting     Assessment RLE Strength Right Hip Flexion: 5/5 Right Hip Extension:  (4-/5) Right Hip ABduction: 4/5 Right Hip ADduction: 4/5 Right Knee Flexion: 4/5 Right Knee Extension:   (5-/5) Right Ankle Dorsiflexion: 4/5 Right Ankle Plantar Flexion: 3+/5  Exercise/Treatments Mobility/Balance  Berg Balance Test Sit to Stand: Able to stand without using hands and stabilize independently Standing Unsupported: Able to stand safely 2 minutes Sitting with Back Unsupported but Feet Supported on Floor or Stool: Able to sit safely and securely 2 minutes Stand to Sit: Controls descent by using hands Transfers: Able to transfer safely, definite need of hands Standing Unsupported with Eyes Closed: Able to stand 10 seconds safely Standing Ubsupported with Feet Together: Able to place feet together independently and stand for 1 minute with supervision From Standing, Reach Forward with Outstretched Arm: Can reach forward >12 cm safely (5") From Standing Position, Pick up Object from Floor: Able to pick up shoe safely and easily From Standing Position, Turn to Look Behind Over each Shoulder: Looks behind one side only/other side shows less weight shift Turn 360 Degrees: Able to turn 360 degrees safely one side only in 4 seconds or less Standing Unsupported, Alternately Place Feet on Step/Stool: Able to stand independently and complete 8 steps >20 seconds Standing Unsupported, One Foot in Front: Able to take small step independently and hold 30 seconds Standing on One Leg: Tries to lift leg/unable to hold 3 seconds but remains standing independently Total Score: 44 Timed Up and Go Test TUG: Normal TUG Normal TUG (seconds): 19.39    Standing Heel Raises: 10 reps Seated Other Seated Knee Exercises: ankle dorsiflexion/plantarflextion x 10 Supine Bridges: 10 reps Sidelying Hip ABduction:  10 reps    Physical Therapy Assessment and Plan PT Assessment and Plan Clinical Impression Statement: Pt is a 63 yo female who had a Lt  thalmic hemorrhage on 12/02/13.  She has had extensive IP rehab and now is being referred to OP therapy to maximize her functional ability.  Melissa [name]  demonstrates significant decreased activity tolerance needing to rest 4 times throughout the [facility] test, decreased balance and decreased Rt LE strength.  Therapist discussed with pt and [name] that at this time the pt should be using a cane.  [name] believes pt has a cane from her TKR.  Pt wil benefit from continued therapy to address the above issues and maximize the pt functional ability. Pt will benefit from skilled therapeutic intervention in order to improve on the following deficits: Decreased activity tolerance;Decreased balance;Decreased strength;Difficulty walking Rehab Potential: Good PT Frequency: Min 3X/week PT Duration: 4 weeks PT Treatment/Interventions: Gait training;Stair training;Functional mobility training;Therapeutic activities;Therapeutic exercise;Neuromuscular re-education;Balance training PT Plan: Begin gait training with cane and high level balance including tandem gt, retro, marching, rockerboard.....    Goals Home Exercise Program Pt/caregiver will Perform Home Exercise Program: For increased strengthening PT Goal: Perform Home Exercise Program - Progress: Goal set today PT Short Term Goals Time to Complete Short Term Goals: 2 weeks PT Short Term Goal 1: Pt to be able to tolerate standing for 15 minutes to be able to complete light housework at home. PT Short Term Goal 2: Pt to be able to tolerate walking with a cane x 15 minutes for improved health habits. PT Short Term Goal 3: Berg balance to be improved 5  pt to allow pt to walk inside without an assistive device. PT Long Term Goals Time to Complete Long Term Goals: 4 weeks PT Long Term Goal 1: Pt to be able to tolerate standing for 30 minutes to be able to make a meal PT Long Term Goal 2: Pt to be able to walk for 30 mintues without sitting to allow short shopping trips Long Term Goal 3: Berg to be improved by 10 pts to allow outside ambulation without an assistive device.  Long Term Goal 4: TUG to be 14  seconds or less for reduced risk of falling.  Problem List Patient Active Problem List   Diagnosis Date Noted  . Difficulty in walking(719.7) 12/28/2013  . Weakness of right leg 12/28/2013  . 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 Thalamic hemorrhage 12/08/2013  . Cerebral hemorrhage, acute 12/02/2013  . 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 ADENOCARCINOMA, ENDOMETRIUM 08/11/2007  . HYPERTHYROIDISM 05/16/2007  . HYPERTENSION 05/16/2007  . OSTEOARTHRITIS 05/16/2007    PT - End of Session Equipment Utilized During Treatment: Gait belt Activity Tolerance: Patient tolerated treatment well General Behavior During Therapy: [facility] for tasks assessed/performed PT Plan of Care PT Home Exercise Plan: given  GP    [name],CINDY 12/28/2013, 11:19 AM  Physician Documentation Your signature is required to indicate approval of the treatment plan as stated above.  Please sign and either send electronically or make a copy of this report for your files and return this physician signed original.   Please mark one 1.__approve of plan  2. ___approve of plan with the following conditions.   ______________________________  _____________________ Physician Signature                                                                                                             Date

## 2013-12-28 NOTE — Evaluation (Signed)
Speech Language Pathology Evaluation Patient Details  Name: Melissa [name] MRN: [id number] Date of Birth: 07/05/1951  Today's Date: 12/28/2013 Time: 1010-1040 SLP Time Calculation (min): 30 min  Authorization: [employer]  Authorization Time Period: 12/28/2013- 01/25/2014  Authorization Visit#: Authorization - Visit Number: 1 of 8   Past Medical History:  Past Medical History  Diagnosis Date  . Hypertension   . Uterine cancer    Past Surgical History:  Past Surgical History  Procedure Laterality Date  . Replacement total knee bilateral    . Cholecystectomy    . Abdominal hysterectomy     HPI:  HPI: Melissa [name] is a 63 y.o. female who was in her normal state of health on March 4. She had a dentist appt at 2:30 pm and then left to go home. She stopped by a [employer] to use the restorrom, and after using it, sat down and became confused. EMS was called and she was taken to [facility] where a CT head showed a left thalamic hemorrhage with IVH. Patient was not administerd TPA secondary to hemorrhage. She was She was transferred to [facility] and admitted to the neuro ICU for further evaluation and treatment. She was hospitalized from March 4-10 before transferring to acute rehab March 10-25. 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 : No Is the patient reluctant to leave their home because of a  fear of falling? : No  Cognition  Overall Cognitive Status: Impaired/Different from baseline Arousal/Alertness: Awake/alert Orientation Level: Oriented X4 Attention: Selective Selective Attention: Impaired Selective Attention Impairment: Verbal basic;Functional basic Memory: Impaired Memory Impairment: Decreased recall of new information;Decreased short term memory;Retrieval deficit Decreased Short Term Memory: Verbal basic;Functional basic Awareness: Impaired Awareness Impairment: Anticipatory impairment Problem Solving: Impaired Problem Solving Impairment: Verbal complex;Functional complex Executive Function: Organizing;Sequencing;Reasoning Reasoning: Impaired Reasoning Impairment: Verbal complex;Functional complex Sequencing: Impaired Sequencing Impairment: Verbal complex;Functional complex Organizing: Impaired Organizing Impairment: Verbal complex;Functional complex Behaviors: Impulsive;Perseveration Safety/Judgment: Impaired Comments: Cognition negatively impacted by aphasia  Comprehension  Auditory Comprehension Overall Auditory Comprehension: Impaired Yes/No Questions: Impaired Basic Biographical Questions: 76-100% accurate Basic Immediate Environment Questions: 75-100% accurate Commands: Impaired One Step Basic Commands: 75-100% accurate Two Step Basic Commands: 50-74% accurate Multistep Basic Commands: 50-74% accurate Conversation: Complex (for comprehension only) Interfering Components: Attention;Processing speed;Working memory EffectiveTechniques: Repetition;Extra processing time;Stressing words;Visual/Gestural cues Visual Recognition/Discrimination Discrimination: Within Function Limits Reading Comprehension Reading Status: Impaired Sentence Level: Impaired Paragraph Level: Impaired Functional Environmental (signs, name badge): Within functional limits Interfering Components: Attention;Impulsivity;Processing time;Visual acuity Effective Techniques: Eye  glasses;Verbal cueing;Visual cueing (cues to re-read)  Expression  Expression Primary Mode of Expression: Verbal Verbal Expression Overall Verbal Expression: Impaired Initiation: No impairment Automatic Speech: Name;Social Response;Counting;Day of week;Month of year Level of Generative/Spontaneous Verbalization: Sentence Repetition: Impaired Level of Impairment: Sentence level Naming: Impairment Responsive: 26-50% accurate Confrontation: Impaired Convergent: Not tested Divergent: Not tested Verbal Errors: Semantic paraphasias;Aware of errors;Perseveration Pragmatics: No impairment Interfering Components: Attention Effective Techniques: Sentence completion;Phonemic cues Non-Verbal Means of Communication: Not applicable Written Expression Dominant Hand:  Right Written Expression: Not tested (able to write first and last name; TBA)  Oral/Motor  Oral Motor/Sensory Function Overall Oral Motor/Sensory Function: Appears within functional limits for tasks assessed Motor Speech Overall Motor Speech: Appears within functional limits for tasks assessed  SLP Goals  Home Exercise SLP Goal: Patient will Perform Home Exercise Program: with supervision, verbal cues required/provided SLP Short Term Goals SLP Short Term Goal 1: Pt will name 5+ items in concrete category with mod/max cueing. SLP Short Term Goal 2: Pt will implement word finding strategies in structured tasks with 80% acc and mod assist (ie. provide verbal descriptions/function). SLP Short Term Goal 3: Pt will verbalize 4+ word sentence when describing pictures with use of carrier phrase as needed on 8/10 trials with mod cues. SLP Short Term Goal 4: Pt will increase auditory comprehension for moderate level y/n questions to 75% with repetition of question as needed. SLP Short Term Goal 5: Pt will refer to memory book/planner to answer personal/bio questions in regards to circumstance and daily activities. SLP Long Term Goals SLP  Long Term Goal 1: Increase expressive communication to [facility] for moderately complex information with use of strategies and min assist. SLP Long Term Goal 2: Utilize compensatory strategies for memory and verbal expression to [facility] with min assist. SLP Long Term Goal 3: Increase auditory comprehension to [facility] for moderately complext information with use of strategies and indirect cues.   Assessment/Plan  Patient Active Problem List   Diagnosis Date Noted  . Difficulty in walking(719.7) 12/28/2013  . Weakness of right leg 12/28/2013  . 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 Aphasia due to stroke 12/28/2013  . Thalamic hemorrhage 12/08/2013  . Cerebral hemorrhage, acute 12/02/2013  . 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 ADENOCARCINOMA, ENDOMETRIUM 08/11/2007  . HYPERTHYROIDISM 05/16/2007  . HYPERTENSION 05/16/2007  . OSTEOARTHRITIS 05/16/2007   SLP - End of Session Activity Tolerance: Patient tolerated treatment well General Behavior During Therapy: WFL for tasks assessed/performed  SLP Assessment/Plan Clinical Impression Statement: Mrs. Keiffer was accompanied to the evaluation by her her husband. 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 Expressive impairments are characterized by anomia, semantic paraphasias, and perseveration. Her awareness of errors is only mildly impaired. Pt shows signs of frustration when she cannot think of the word she wants to use. Prior to this stroke, the pt was independent with driving, cooking, and finances. Skilled speech/language intervention is recommended to maximize independence in communication, decrease burden of care, and increase safety in home/community. Prognosis for continued improvement is good given progress thus far, motivation, and family support. Speech Therapy Frequency: min  2x/week Duration: 4 weeks Treatment/Interventions: Language facilitation;Compensatory techniques;Cueing hierarchy;SLP instruction and feedback;Compensatory strategies;Patient/family education Potential to Achieve Goals: Good Potential Considerations: Ability to learn/carryover information;Severity of impairments   Thank you,  Genene Churn, [place]      [place] 12/28/2013, 3:10 PM  Physician Documentation Your signature is required to indicate approval of the treatment plan as stated above.  Please sign and either send electronically or make a copy of this report for your files and return this physician signed original.  Please mark one 1.__approve of plan  2. ___approve of plan with the following conditions.   ______________________________  _____________________ Physician Signature                                                                                                             Date

## 2013-12-28 NOTE — Evaluation (Signed)
Occupational Therapy Evaluation  Patient Details  Name: Melissa [name] MRN: [id number] Date of Birth: 05-03-1951  Today's Date: 12/28/2013 Time: 0930-1010 OT Time Calculation (min): 40 min OT Evaluation' Visit#: 1 of 12  Re-eval: 01/25/14  Assessment Diagnosis: CVA with Right Sided Weakness Next MD Visit: one month Prior Therapy: CIR OT, PT, ST  Authorization: [employer]    Past Medical History:  Past Medical History  Diagnosis Date  . Hypertension   . Uterine cancer    Past Surgical History:  Past Surgical History  Procedure Laterality Date  . Replacement total knee bilateral    . Cholecystectomy    . Abdominal hysterectomy      Subjective  S: " I want to crochet." Pertinent History: Melissa [name] was at work on 12/02/13 when she suffered a stroke.  She was taken to [facility] ED and then transferred to [facility] ICU.  She was diagnosed with a thalamic hemmorhage.  SHe was later transferred to inpatient rehab and received PT, OT, and ST. She was discharged home on 12/24/13.  She has been referred to occupational therapy for evaluation and treatment.   Special Tests: DASH:  13.63 with ideal score being 0. Patient Stated Goals: "I w ant to crochet." Pain Assessment Currently in Pain?: No/denies Pain Score: 0-No pain  Precautions/Restrictions  Precautions Precautions: Fall Restrictions Weight Bearing Restrictions: No  Balance Screening Balance Screen Has the patient fallen in the past 6 months: No Has the patient had a decrease in activity level because of a fear of falling? : No Is the patient reluctant to leave their home because of a fear of falling? : No  Prior [place] expects to be discharged to:: Private residence Living Arrangements: Spouse/significant other;Children Available Help at Discharge: Family;Available 24 hours/day Type of Home: House Home Access: Stairs to enter [employer] of Steps: 2 Entrance Stairs-Rails:  None Home Layout: One level  Lives With: Spouse Prior Function Level of Independence: Independent with basic ADLs;Independent with homemaking with ambulation;Independent with gait;Independent with transfers  Able to Take Stairs?: Yes Driving: Yes Vocation: Full time employment Vocation Requirements: nurse standing Leisure: Hobbies-yes (Comment) Comments: crocheting and knitting going to the [place] ADL/Vision/Perception ADL ADL Comments: min pa with donning socks and shoes and CGA with shower transfers Dominant Hand: Right Vision - History Baseline Vision: No visual deficits Perception Perception: Within Functional Limits Praxis Praxis: Intact  Cognition/Observation  refer to speech evaluation  Sensation/Coordination/Edema Sensation Light Touch: Appears Intact Coordination Gross Motor Movements are Fluid and Coordinated: Yes Fine Motor Movements are Fluid and Coordinated: No Finger Nose Finger Test: decreased speed with RUE 9 Hole Peg Test: right 20.25"  left 22.97"  Additional Assessments RUE AROM (degrees) RUE Overall AROM Comments: AROM is WFL RUE PROM (degrees) RUE Overall PROM Comments: PROM is [facility] RUE Strength RUE Overall Strength Comments: Strength is WFL in shoulder elbow, forearm, and wrist Grip (lbs): 55 (left 55) Lateral Pinch: 16 lbs (left 20 pounds) 3 Point Pinch: 16 lbs (left 16 pounds) Right Hand Strength - Pinch (lbs) Lateral Pinch: 16 lbs (left 20 pounds) 3 Point Pinch: 16 lbs (left 16 pounds)    Occupational Therapy Assessment and Plan OT Assessment and Plan Clinical Impression Statement: A:  Patient presents with decreased strength and fine motor coordination, decreased sustained activity tolerance, and decreased independence with handwriting, keyboarding, and independence with all B/IADLS, work, and leisure activities.  Pt will benefit from skilled therapeutic intervention in order to improve on the following  deficits: Decreased  strength;Decreased coordination;Decreased activity tolerance;Impaired UE functional use Rehab Potential: Excellent OT Frequency: Min 2X/week OT Duration: 6 weeks OT Treatment/Interventions: Self-care/ADL training;Therapeutic exercise;Neuromuscular education;Manual therapy;Therapeutic activities;Patient/family education OT Plan: P: Skilled OT intervention to improve grip and pinch strength, coordination, sustained activity tolerance in order to return to prior level of independence with all B/IADLs, work, and leisure activities.  Treatment Plan:  proximal shoulder strengthening, grip and pinch strengthening, [place] training, handwriting, keyboarding.     Goals Short Term Goals Time to Complete Short Term Goals: 3 weeks Short Term Goal 1: Patient will be educated on a HEP. Short Term Goal 2: Patient will improve right grip strength by 5 pounds and pinch strength by 2 pounds in order to open containers when cooking with increased independence  Short Term Goal 3: Patient will improve fine motor coordination by decreasing time on Nine Hole Peg Test by 2 seconds or more.  Short Term Goal 4: Patient will have good sustained activity tolerance.  Long Term Goals Time to Complete Long Term Goals: 6 weeks Long Term Goal 1: Patient will return to prior level of independence with B/IADLs and leisure activities.  Long Term Goal 2: Patient will improve right grip strength by 10 pounds and pinch strength by 5 pounds in order to open containers when cooking with increased independence  Long Term Goal 3: Patient will improve fine motor coordination by decreasing time on Nine Hole Peg Test by 4 seconds or more.  Long Term Goal 4: Patient will have good + sustained activity tolerance.  Long Term Goal 5: Patient will demonstate good typing and writing skills.   Problem List Patient Active Problem List   Diagnosis Date Noted  . Difficulty in walking(719.7) 12/28/2013  . Weakness of right leg 12/28/2013  . Impaired  balance as late effect of cerebrovascular accident 12/28/2013  . Aphasia due to stroke 12/28/2013  . Lack of coordination 12/28/2013  . Hand weakness 12/28/2013  . Thalamic hemorrhage 12/08/2013  . Cerebral hemorrhage, acute 12/02/2013  . 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 ADENOCARCINOMA, ENDOMETRIUM 08/11/2007  . HYPERTHYROIDISM 05/16/2007  . HYPERTENSION 05/16/2007  . OSTEOARTHRITIS 05/16/2007    End of Session Activity Tolerance: Patient tolerated treatment well General Behavior During Therapy: WFL for tasks assessed/performed OT Plan of Care OT Home Exercise Plan: min resist theraputty, fine motor coordination training, and right hand keyboarding.  Consulted and Agree with Plan of Care: Patient  GO    Vangie Bicker, OTR/L  12/28/2013, 10:19 PM  Physician Documentation Your signature is required to indicate approval of the treatment plan as stated above.  Please sign and either send electronically or make a copy of this report for your files and return this physician signed original.  Please mark one 1.__approve of plan  2. ___approve of plan with the following conditions.   ______________________________                                                          _____________________ Physician Signature  Date  

## 2013-12-29 ENCOUNTER — Ambulatory Visit (INDEPENDENT_AMBULATORY_CARE_PROVIDER_SITE_OTHER): Payer: 59 | Admitting: Internal Medicine

## 2013-12-29 ENCOUNTER — Encounter: Payer: Self-pay | Admitting: Internal Medicine

## 2013-12-29 VITALS — BP 142/72 | HR 72 | Temp 98.2°F | Ht 70.0 in | Wt 273.0 lb

## 2013-12-29 DIAGNOSIS — I619 Nontraumatic intracerebral hemorrhage, unspecified: Secondary | ICD-10-CM

## 2013-12-29 DIAGNOSIS — I10 Essential (primary) hypertension: Secondary | ICD-10-CM

## 2013-12-29 MED ORDER — METOPROLOL TARTRATE 100 MG PO TABS: 50.0000 mg | ORAL_TABLET | Freq: Two times a day (BID) | ORAL | Status: DC

## 2013-12-29 NOTE — Progress Notes (Signed)
Pre visit review using our clinic review tool, if applicable. No additional management support is needed unless otherwise documented below in the visit note. 

## 2013-12-29 NOTE — Patient Instructions (Signed)
Monitor bp Keep blood pressures below 135/85  Decrease metoprolol and don't use hydralazine because your blood pressures are too low

## 2013-12-29 NOTE — Progress Notes (Signed)
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   Going to PT Has been advised to use cane  Appetite and sleep are good  She is tolearting meds- HOME BPs- Range- 99-148/54-84 In general labs are less than 110/70 Regularly takes norvasc q a.m. hctz a.m.  metoprolol a.m and pm  Avapro in the afternoon  Not taking hydralazine  Knee pain-- not using any medications.   Reviewed hospital notes.  Reviewed past medical history, family history, social history.  Review of systems: She states that she feels fine. She denies chest pain or headache. No other specific complaints. She is frustrated with her a facial.  Physical exam: Review vital signs Chest clear to auscultation Cardiac exam S1-S2 are regular Abdominal exam overweight, active bowel sounds Extremities no edema  Thalamic hemorrhage with stroke Reviewed hospital records and inpatient rehabilitation notes. She clearly is improved. She is undergoing outpatient physical therapy. She still has trouble with a facial. Will need to address risk factors including blood pressure.  HYPERTENSION Her home blood pressures have been quite low. I'm concerned of falls due to orthostatic hypotension. We monitored her blood pressure in our office compared to her home blood pressure cuff. They're essentially the same. I will decrease metoprolol to 50 mg twice a day and a limited hydralazine (she hasn't been taking that he might). We'll continue other antihypertensive medications including amlodipine, hydrochlorothiazide, Avapro.

## 2013-12-30 ENCOUNTER — Ambulatory Visit (HOSPITAL_COMMUNITY)
Admission: RE | Admit: 2013-12-30 | Discharge: 2013-12-30 | Disposition: A | Discharge status: Home / Self Care | Payer: 59 | Source: Ambulatory Visit | Attending: Internal Medicine | Admitting: Internal Medicine

## 2013-12-30 DIAGNOSIS — I10 Essential (primary) hypertension: Secondary | ICD-10-CM | POA: Insufficient documentation

## 2013-12-30 DIAGNOSIS — M6281 Muscle weakness (generalized): Secondary | ICD-10-CM | POA: Insufficient documentation

## 2013-12-30 DIAGNOSIS — R262 Difficulty in walking, not elsewhere classified: Secondary | ICD-10-CM | POA: Insufficient documentation

## 2013-12-30 DIAGNOSIS — I69998 Other sequelae following unspecified cerebrovascular disease: Secondary | ICD-10-CM | POA: Insufficient documentation

## 2013-12-30 DIAGNOSIS — IMO0001 Reserved for inherently not codable concepts without codable children: Secondary | ICD-10-CM | POA: Insufficient documentation

## 2013-12-30 NOTE — Progress Notes (Signed)
Physical Therapy Treatment Patient Details  Name: Melissa [name] MRN: [id number] Date of Birth: 16-Feb-1951  Today's Date: 12/30/2013 Time: 6063-0160 PT Time Calculation (min): 43 min Visit#: 2 of 12  Charges:  therex 1093-2355 (12'), NMR 1318-1346 (28')    Subjective: Symptoms/Limitations Symptoms: Pt states she was sore after last visit.  Husband states they have been working on the exercises at home.  Husband reports patient will not use the cane and it confuses her.  Pain Assessment Currently in Pain?: No/denies   Exercise/Treatments Aerobic Stationary Bike: nustep hills #2 level 2 8 minutes UE/LE Standing Heel Raises: 10 reps;Limitations Heel Raises Limitations: toeraises 10 reps SLS: 5 reps no UE max Lt: 14", Rt: 8" Other Standing Knee Exercises: Hip abduction/extension bilaterally 10 reps each with UE assist Other Standing Knee Exercises: retro, tandem and side stepping 2RT each     Physical Therapy Assessment and Plan PT Assessment and Plan Clinical Impression Statement: Began dynamic balance activities per PT POC.  Pt with overall good balance without LOB during activities.  Pt fatigues easily requiring rest break between each activity.  Therapist facilitation needed to perform therex in correct form. PT Plan: Progress high level gait.  Add rockerboard next visit.      Problem List Patient Active Problem List   Diagnosis Date Noted  . Difficulty in walking(719.7) 12/28/2013  . Weakness of right leg 12/28/2013  . 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 Aphasia due to stroke 12/28/2013  . Lack of coordination 12/28/2013  . Hand weakness 12/28/2013  . Thalamic hemorrhage 12/08/2013  . Cerebral hemorrhage, acute 12/02/2013  . 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 ADENOCARCINOMA, ENDOMETRIUM 08/11/2007  . HYPERTHYROIDISM 05/16/2007  . HYPERTENSION 05/16/2007  . OSTEOARTHRITIS 05/16/2007    PT - End of  Session Equipment Utilized During Treatment: Gait belt Activity Tolerance: Patient limited by fatigue;Patient tolerated treatment well General Behavior During Therapy: [facility] for tasks assessed/performed   Teena Irani, PTA/CLT 12/30/2013, 5:11 PM

## 2014-01-01 NOTE — Assessment & Plan Note (Signed)
Reviewed hospital records and inpatient rehabilitation notes. She clearly is improved. She is undergoing outpatient physical therapy. She still has trouble with a facial. Will need to address risk factors including blood pressure.

## 2014-01-01 NOTE — Assessment & Plan Note (Signed)
Her home blood pressures have been quite low. I'm concerned of falls due to orthostatic hypotension. We monitored her blood pressure in our office compared to her home blood pressure cuff. They're essentially the same. I will decrease metoprolol to 50 mg twice a day and a limited hydralazine (she hasn't been taking that he might). We'll continue other antihypertensive medications including amlodipine, hydrochlorothiazide, Avapro.

## 2014-01-04 ENCOUNTER — Ambulatory Visit (HOSPITAL_COMMUNITY)
Admission: RE | Admit: 2014-01-04 | Discharge: 2014-01-04 | Disposition: A | Discharge status: Home / Self Care | Payer: 59 | Source: Ambulatory Visit | Attending: Internal Medicine | Admitting: Internal Medicine

## 2014-01-04 NOTE — Progress Notes (Signed)
Occupational Therapy Treatment Patient Details  Name: Melissa [name] MRN: [id number] Date of Birth: Apr 02, 1951  Today's Date: 01/04/2014 Time: 5784-6962 OT Time Calculation (min): 49 min Neuro reed [phone] 22' Therapeutic activities [phone] 25'  Visit#: 2 of 12  Re-eval: 01/25/14    Authorization: UMR  Subjective S:  I have been doing the exercises.   Pain Assessment Currently in Pain?: No/denies   Exercise/Treatments  ROM / Strengthening / Isometric Strengthening "W" Arms: 10 times  X to V Arms: 10 times, slight lag with right vs left Proximal Shoulder Strengthening, Seated: 10 times each without resting, slight lag of right vs left    Hand Exercises Theraputty - Flatten: pink in standing Theraputty - Roll: pink seated Theraputty - Grip: pink with forearm supinated and pronated  Theraputty - Locate Pegs: pink 10 beads, min vg to use right hand to locate pegs Sponges: 24, 28 Small Pegboard: grooved pegboard 0 difficulty placing pegs, had min difficulty removing and translating from fingertips to palm, dropping 5 during activity Other Hand Exercises: right hand typing words:  loop, pill mom mop pool pin nip [place] pink link, had max difficulty remembering where back space is located and very difficult time with word pink        Occupational Therapy Assessment and Plan OT Assessment and Plan Clinical Impression Statement: A:  slight lag in symmetry with right vs left during bilateral proximal shoulder strengthening activities  OT Plan: P:  Attempt cooking activity to address independence with both motor activities and memory, sequencing.     Goals Short Term Goals Time to Complete Short Term Goals: 3 weeks Short Term Goal 1: Patient will be educated on a HEP. Short Term Goal 1 Progress: Progressing toward goal Short Term Goal 2: Patient will improve right grip strength by 5 pounds and pinch strength by 2 pounds in order to open containers when cooking with increased  independence  Short Term Goal 2 Progress: Progressing toward goal Short Term Goal 3: Patient will improve fine motor coordination by decreasing time on Nine Hole Peg Test by 2 seconds or more.  Short Term Goal 3 Progress: Progressing toward goal Short Term Goal 4: Patient will have good sustained activity tolerance.  Short Term Goal 4 Progress: Progressing toward goal Long Term Goals Time to Complete Long Term Goals: 6 weeks Long Term Goal 1: Patient will return to prior level of independence with B/IADLs and leisure activities.  Long Term Goal 1 Progress: Progressing toward goal Long Term Goal 2: Patient will improve right grip strength by 10 pounds and pinch strength by 5 pounds in order to open containers when cooking with increased independence  Long Term Goal 2 Progress: Progressing toward goal Long Term Goal 3: Patient will improve fine motor coordination by decreasing time on Nine Hole Peg Test by 4 seconds or more.  Long Term Goal 3 Progress: Progressing toward goal Long Term Goal 4: Patient will have good + sustained activity tolerance.  Long Term Goal 4 Progress: Progressing toward goal Long Term Goal 5: Patient will demonstate good typing and writing skills.  Long Term Goal 5 Progress: Progressing toward goal  Problem List Patient Active Problem List   Diagnosis Date Noted  . 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 ADENOCARCINOMA, ENDOMETRIUM 08/11/2007  . HYPERTHYROIDISM 05/16/2007  . HYPERTENSION 05/16/2007  . OSTEOARTHRITIS 05/16/2007    End of Session Activity Tolerance: Patient tolerated treatment well General Behavior During Therapy: [facility] for tasks assessed/performed  GO  Vangie Bicker, OTR/L  01/04/2014, 10:22 AM

## 2014-01-04 NOTE — Progress Notes (Addendum)
Physical Therapy Treatment Patient Details  Name: Melissa [name] MRN: [id number] Date of Birth: 05/28/1951  Today's Date: 01/04/2014 Time: 1020-1103 PT Time Calculation (min): 43 min Charges: Therex x 10'(1020-1030) NMR x 28'(1035-1103)  Visit#: 3 of 12  Re-eval: 01/27/14    Subjective: Symptoms/Limitations Symptoms:  Pt and pt's husband states that the pt's knees have been sore following therapy. Pt also reports decreased confidence with sit to stand exercises. Pt refuses to use cane at home or in public.  Pain Assessment Currently in Pain?: Yes Pain Score: 5  Pain Location: Knee Pain Orientation: Left  Exercise/Treatments Balance Exercises Standing Tandem Stance: 2 reps;30 secs SLS: Limitations SLS Limitations: LLE:8" RLE:5" Tandem Gait: 2 reps Retro Gait: 2 reps Sidestepping: 2 reps Heel Raises: 10 reps Toe Raise: 10 reps Sit to Stand: Limitations Sit to Stand Limitations: 5x Other Standing Exercises: Hip abduction/extension x 10 BLE Other Standing Exercises: rocker board x 2' R/L   Physical Therapy Assessment and Plan PT Assessment and Plan Clinical Impression Statement: Assessment: SPTA facilitated balance and strengthening exercises to increase pt's balance and strength in order to increase pt's functional activity. Pt required occasional multimodal cuing for technique specifically with rockerboard wt. shifting exercise. Pt required rest breaks during therex secondary to decreased activity tolerance. Pt displays hesitance with sit to stand without UE assistance but responded well to verbal motivation. Pt was educated on the importance of using AD with balance deficits to improve safety. This entire session was guided, instructed, and directly supervised by Rachelle Hora, PTA. PT Plan: Continue tandem stance exercise next session with emphasis on true tandem stance with heel to toe. Next session implement cone rotation on uneven surface or side stepping on uneven  surfaces to improve dynamic balance and gait.    Problem List Patient Active Problem List   Diagnosis Date Noted  . 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 ADENOCARCINOMA, ENDOMETRIUM 08/11/2007  . HYPERTHYROIDISM 05/16/2007  . HYPERTENSION 05/16/2007  . OSTEOARTHRITIS 05/16/2007    PT - End of Session Equipment Utilized During Treatment: Gait belt Activity Tolerance: Patient limited by fatigue;Patient tolerated treatment well General Behavior During Therapy: [facility] for tasks assessed/performed  Ahmed Prima, SPTA Rachelle Hora, PTA  01/04/2014, 11:21 AM

## 2014-01-06 ENCOUNTER — Inpatient Hospital Stay (HOSPITAL_COMMUNITY): Admission: RE | Admit: 2014-01-06 | Payer: 59 | Source: Ambulatory Visit | Admitting: Speech Pathology

## 2014-01-07 ENCOUNTER — Ambulatory Visit (HOSPITAL_COMMUNITY)
Admission: RE | Admit: 2014-01-07 | Discharge: 2014-01-07 | Disposition: A | Discharge status: Home / Self Care | Payer: 59 | Source: Ambulatory Visit | Attending: Physical Medicine & Rehabilitation | Admitting: Physical Medicine & Rehabilitation

## 2014-01-07 ENCOUNTER — Ambulatory Visit (HOSPITAL_COMMUNITY)
Admission: RE | Admit: 2014-01-07 | Discharge: 2014-01-07 | Disposition: A | Discharge status: Home / Self Care | Payer: 59 | Source: Ambulatory Visit | Attending: Internal Medicine | Admitting: Internal Medicine

## 2014-01-07 DIAGNOSIS — I69320 Aphasia following cerebral infarction: Secondary | ICD-10-CM

## 2014-01-07 NOTE — Progress Notes (Signed)
Speech Language Pathology Treatment Patient Details  Name: Melissa [name] MRN: [id number] Date of Birth: Mar 03, 1951  Today's Date: 01/07/2014 Time: 4034-7425 SLP Time Calculation (min): 13 min  Authorization: [employer]  Authorization Time Period: 12/28/2013- 01/25/2014  Authorization Visit#:  2 of 8   HPI:  Symptoms/Limitations Symptoms: "Speech is what she needs the most."-pt's husband Pain Assessment Currently in Pain?: No/denies    Treatment  Cognitive-Linguistic Therapy Aphasia Therapy Patient/Family Education Home Exercise Program  SLP Goals  Home Exercise SLP Goal: Patient will Perform Home Exercise Program: with supervision, verbal cues required/provided SLP Goal: Perform Home Exercise Program - Progress: Progressing toward goal SLP Short Term Goals SLP Short Term Goal 1: Pt will name 5+ items in concrete category with mod/max cueing. SLP Short Term Goal 1 - Progress: Progressing toward goal SLP Short Term Goal 2: Pt will implement word finding strategies in structured tasks with 80% acc and mod assist (ie. provide verbal descriptions/function). SLP Short Term Goal 2 - Progress: Progressing toward goal SLP Short Term Goal 3: Pt will verbalize 4+ word sentence when describing pictures with use of carrier phrase as needed on 8/10 trials with mod cues. SLP Short Term Goal 3 - Progress: Progressing toward goal SLP Short Term Goal 4: Pt will increase auditory comprehension for moderate level y/n questions to 75% with repetition of question as needed. SLP Short Term Goal 4 - Progress: Progressing toward goal SLP Short Term Goal 5: Pt will refer to memory book/planner to answer personal/bio questions in regards to circumstance and daily activities. SLP Short Term Goal 5 - Progress: Progressing toward goal SLP Long Term Goals SLP Long Term Goal 1: Increase expressive communication to [facility] for moderately complex information with use of strategies and min assist. SLP  Long Term Goal 1 - Progress: Progressing toward goal SLP Long Term Goal 2: Utilize compensatory strategies for memory and verbal expression to [facility] with min assist. SLP Long Term Goal 2 - Progress: Progressing toward goal SLP Long Term Goal 3: Increase auditory comprehension to [facility] for moderately complext information with use of strategies and indirect cues.  SLP Long Term Goal 3 - Progress: Progressing toward goal  Assessment/Plan  Patient Active Problem List   Diagnosis Date Noted  . Aphasia due to recent cerebrovascular accident 01/07/2014  . 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 ADENOCARCINOMA, ENDOMETRIUM 08/11/2007  . HYPERTHYROIDISM 05/16/2007  . HYPERTENSION 05/16/2007  . OSTEOARTHRITIS 05/16/2007   SLP - End of Session Activity Tolerance: Patient tolerated treatment well General Behavior During Therapy: WFL for tasks assessed/performed  SLP Assessment/Plan Clinical Impression Statement: Melissa [name] was accompanied to therapy by her husband. She completed her homework (single word sentence completion) with moderate assist from her husband. Pt was given a list of word finding strategies which were reviewed and implemented in session with pt and husband. Husband is extremely supportive, however he was encouraged to provide less cueing and more time for his wife to respond. She required mod/max cues for concrete naming tasks (TV shows she enjoys watching and items related to same). She was able to provide general/nonspecific responses ("They go visit things.") and needed more cueing for specifics ("houses"). She completed [place] tasks verbally with 90% acc and stated function of objects with 75% acc. Homework provided. Continue plan of care. Husband says he can get a small notebook to keep her therapy material in.  Speech Therapy Frequency: min 2x/week Duration: 4 weeks Treatment/Interventions: Language facilitation;Compensatory techniques;Cueing hierarchy;SLP instruction and  feedback;Compensatory strategies;Patient/family education  Potential to Achieve Goals: Good Potential Considerations: Ability to learn/carryover information;Severity of impairments  Thank you,  Genene Churn, [place]     Ephraim Hamburger 01/07/2014, 11:15 AM

## 2014-01-07 NOTE — Progress Notes (Signed)
Physical Therapy Treatment Patient Details  Name: Melissa [name] MRN: [id number] Date of Birth: 08-13-1951  Today's Date: 01/07/2014 Time: 1308-6578 PT Time Calculation (min): 44 min Visit#: 4 of 12  Re-eval: 01/27/14 Authorization: UMR  Charges:  therex 1104-1120 (16'), NMR 1122-1148 (26')   Subjective:  Pt states she is doing her exercises at home.  Reports no significant difficulties with tasks at home.  Currently without pain.   Exercise/Treatments Aerobic Stationary Bike: nustep hills #2 level 2 10 minutes UE/LE Standing Heel Raises: Limitations Heel Raises Limitations: heel walk, toe walk 1RT each SLS: 5 reps no UE max Lt: 18", Rt: 10" Hip abduction/extension bilaterallyl 10 reps each with UE assist Balance beam retro, tandem and side stepping 1RT Standing:  Cone rotations on foam R/L Seated Other Seated Knee Exercises: STS 10 reps standard surface without UE"s     Physical Therapy Assessment and Plan PT Assessment and Plan Clinical Impression Statement: Progressed to cone activity on foam with lateral transition without LOB.  Also progressed balance activities to balance beam surface and heel/toe walking.  Added STS without UE assist.  continues to require therapist facilitation with most exercises to perform in correct form.  Overall improved stability and gait quality.  PT Plan: continue to progress dynamic balance and core stability.  Add lunges, squats and SLS vectors next session.     Problem List Patient Active Problem List   Diagnosis Date Noted  . Aphasia due to recent cerebrovascular accident 01/07/2014  . 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 ADENOCARCINOMA, ENDOMETRIUM 08/11/2007  . HYPERTHYROIDISM 05/16/2007  . HYPERTENSION 05/16/2007  . OSTEOARTHRITIS 05/16/2007    PT - End of Session Equipment Utilized During Treatment: Gait belt Activity Tolerance: Patient limited by fatigue;Patient tolerated treatment well General Behavior During  Therapy: [facility] for tasks assessed/performed   Teena Irani, PTA/CLT 01/07/2014, 11:55 AM

## 2014-01-11 ENCOUNTER — Ambulatory Visit (HOSPITAL_COMMUNITY)
Admission: RE | Admit: 2014-01-11 | Discharge: 2014-01-11 | Disposition: A | Discharge status: Home / Self Care | Payer: 59 | Source: Ambulatory Visit | Attending: Internal Medicine | Admitting: Internal Medicine

## 2014-01-11 DIAGNOSIS — I69320 Aphasia following cerebral infarction: Secondary | ICD-10-CM

## 2014-01-11 NOTE — Progress Notes (Signed)
Physical Therapy Treatment Patient Details  Name: Melissa [name] MRN: [id number] Date of Birth: 04-03-1951  Today's Date: 01/11/2014 Time: 9735-3299 PT Time Calculation (min): 43 min Charges: Therex x 16' [phone]) NMR x 23' [phone])  Visit#: 5 of 12  Re-eval: 01/27/14    Authorization: UMR  Authorization Time Period:    Authorization Visit#:   of     Subjective: Symptoms/Limitations Symptoms: Pt states that her L knee is bothering her.  Pain Assessment Currently in Pain?: Yes Pain Score: 5  Pain Location: Knee Pain Orientation: Left   Exercise/Treatments Standing Heel Raises: Limitations Heel Raises Limitations: Heel walk forward 2 RT; Heel walk sidestep 1 RT Forward Lunges: Both;15 reps Functional Squat: 15 reps Other Standing Knee Exercises: Hip abduction/extension bilaterallyl 10 reps each with UE assist Other Standing Knee Exercises: tandem gait 2 RT, side stepping 1 RT on balance beam Numbers 1-15: 1 rep;Balance Beam  Seated Other Seated Knee Exercises: Standing:  Cone rotations on foam R/L 2 reps   Physical Therapy Assessment and Plan PT Assessment and Plan Clinical Impression Statement: Began forward lunges and functional squats to increase LE strength and balance without LOB. Continued balance beam activities and heel walking in forward gait and sidestepping. Pt displays decreased activitiy tolerance and decreased confidence with balance beam activities. Pt requires multimodal cueing during exercises to improve form and technique.  Pt used wall to recover balance during balance beam activities. This entire session was guided, instructed, and directly supervised by Rachelle Hora, PTA. PT Plan: continue to progress dynamic balance and core stability.  Continue lunges and squats next session. Continue balance beam gait activities next session.     Problem List Patient Active Problem List   Diagnosis Date Noted  . Aphasia due to recent cerebrovascular  accident 01/07/2014  . 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 ADENOCARCINOMA, ENDOMETRIUM 08/11/2007  . HYPERTHYROIDISM 05/16/2007  . HYPERTENSION 05/16/2007  . OSTEOARTHRITIS 05/16/2007    PT - End of Session Equipment Utilized During Treatment: Gait belt Activity Tolerance: Patient limited by fatigue;Patient tolerated treatment well General Behavior During Therapy: [facility] for tasks assessed/performed  Ahmed Prima, [place], PTA  01/11/2014, 3:29 PM

## 2014-01-11 NOTE — Progress Notes (Signed)
Occupational Therapy Treatment Patient Details  Name: Melissa [name] MRN: [id number] Date of Birth: 03/18/1951  Today's Date: 01/11/2014 Time: 1448-1856 OT Time Calculation (min): 38 min Neuro reeducation 1310-1330 20' Cognitive retraining [phone] 86'  Visit#: 3 of 12  Re-eval: 01/25/14    Authorization: UMR  Subjective S: WE practiced the typing, it went pretty well.   Pain Assessment Currently in Pain?: No/denies  Precautions/Restrictions   progress as tolerated  Exercise/Treatments  Sponges: 19, 26, 33 Theraputty - Flatten: pink in standing Theraputty - Roll: pink seated Theraputty - Grip: pink with forearm supinated and pronated  Theraputty - Locate Pegs: pink 15 beads with pincer grasp  Hand Exercises Theraputty - Flatten: pink in standing Theraputty - Roll: pink seated Theraputty - Grip: pink with forearm supinated and pronated  Theraputty - Locate Pegs: pink 15 beads with pincer grasp Sponges: 19, 26, 33 Other Hand Exercises: wrist stability  with pink theraputty with pvc pipe 5 times  Cognitive Exercises Visual Motor Integration: attempted perfection game as a coordination exercise, however, difficulty.  patient had difficulty detecting which space each game piece fit into, requirng max cuing to correct 2 of the pieces.  Patient did not have her glasses on during first attempt and therapist could not justly determine if issue was related to vision or a visual motor issue.  Attempted again with glasses on and patient completed with slight increase in independence, however continued to have difficulty placing in correct opening even unique shapes such as the cross were difficulty.  She utilized a good compensatory strategy independently by going onto easier shapes rather than perseverating on more diffiult items        Occupational Therapy Assessment and Plan OT Assessment and Plan Clinical Impression Statement: A:  perfection game proved more difficult with  visual motor integration vs fine motor coordination.  OT Plan: P:  Complete small pegboard design to further assess visual motor integration difficulties and complete cooking activity.   Goals Short Term Goals Time to Complete Short Term Goals: 3 weeks Short Term Goal 1: Patient will be educated on a HEP. Short Term Goal 1 Progress: Progressing toward goal Short Term Goal 2: Patient will improve right grip strength by 5 pounds and pinch strength by 2 pounds in order to open containers when cooking with increased independence  Short Term Goal 2 Progress: Progressing toward goal Short Term Goal 3: Patient will improve fine motor coordination by decreasing time on Nine Hole Peg Test by 2 seconds or more.  Short Term Goal 3 Progress: Progressing toward goal Short Term Goal 4: Patient will have good sustained activity tolerance.  Short Term Goal 4 Progress: Progressing toward goal Long Term Goals Time to Complete Long Term Goals: 6 weeks Long Term Goal 1: Patient will return to prior level of independence with B/IADLs and leisure activities.  Long Term Goal 1 Progress: Progressing toward goal Long Term Goal 2: Patient will improve right grip strength by 10 pounds and pinch strength by 5 pounds in order to open containers when cooking with increased independence  Long Term Goal 2 Progress: Progressing toward goal Long Term Goal 3: Patient will improve fine motor coordination by decreasing time on Nine Hole Peg Test by 4 seconds or more.  Long Term Goal 3 Progress: Progressing toward goal Long Term Goal 4: Patient will have good + sustained activity tolerance.  Long Term Goal 4 Progress: Progressing toward goal Long Term Goal 5: Patient will demonstate good typing and writing skills.  Long Term Goal 5 Progress: Progressing toward goal  Problem List Patient Active Problem List   Diagnosis Date Noted  . Aphasia due to recent cerebrovascular accident 01/07/2014  . Thalamic hemorrhage with stroke  12/02/2013  . ADENOCARCINOMA, ENDOMETRIUM 08/11/2007  . HYPERTHYROIDISM 05/16/2007  . HYPERTENSION 05/16/2007  . OSTEOARTHRITIS 05/16/2007    End of Session Activity Tolerance: Patient tolerated treatment well General Behavior During Therapy: [facility] for tasks assessed/performed  [place], OTR/L  01/11/2014, 1:50 PM

## 2014-01-11 NOTE — Progress Notes (Signed)
Speech Language Pathology Treatment Patient Details  Name: Melissa [name] MRN: [id number] Date of Birth: [birth year]  Today's Date: 01/11/2014 Time: 9326-7124 SLP Time Calculation (min): 43 min  Authorization: [employer]  Authorization Time Period: 12/28/2013- 01/25/2014  Authorization Visit#:  3 of 8   HPI:  Symptoms/Limitations Symptoms: Doing well. Pain Assessment Currently in Pain?: No/denies   Treatment  Cognitive-Linguistic Therapy Apraxia Therapy Aphasia Therapy Dysphagia Therapy Patient/Family Education Home Exercise Program  SLP Goals  Home Exercise SLP Goal: Patient will Perform Home Exercise Program: with supervision, verbal cues required/provided SLP Goal: Perform Home Exercise Program - Progress: Progressing toward goal SLP Short Term Goals SLP Short Term Goal 1: Pt will name 5+ items in concrete category with mod/max cueing. SLP Short Term Goal 1 - Progress: Progressing toward goal SLP Short Term Goal 2: Pt will implement word finding strategies in structured tasks with 80% acc and mod assist (ie. provide verbal descriptions/function). SLP Short Term Goal 2 - Progress: Progressing toward goal SLP Short Term Goal 3: Pt will verbalize 4+ word sentence when describing pictures with use of carrier phrase as needed on 8/10 trials with mod cues. SLP Short Term Goal 3 - Progress: Progressing toward goal SLP Short Term Goal 4: Pt will increase auditory comprehension for moderate level y/n questions to 75% with repetition of question as needed. SLP Short Term Goal 4 - Progress: Progressing toward goal SLP Short Term Goal 5: Pt will refer to memory book/planner to answer personal/bio questions in regards to circumstance and daily activities. SLP Short Term Goal 5 - Progress: Progressing toward goal SLP Long Term Goals SLP Long Term Goal 1: Increase expressive communication to [facility] for moderately complex information with use of strategies and min assist. SLP Long  Term Goal 1 - Progress: Progressing toward goal SLP Long Term Goal 2: Utilize compensatory strategies for memory and verbal expression to [facility] with min assist. SLP Long Term Goal 2 - Progress: Progressing toward goal SLP Long Term Goal 3: Increase auditory comprehension to [facility] for moderately complext information with use of strategies and indirect cues.  SLP Long Term Goal 3 - Progress: Progressing toward goal  Assessment/Plan  Patient Active Problem List   Diagnosis Date Noted  . Aphasia due to recent cerebrovascular accident 01/07/2014  . 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 ADENOCARCINOMA, ENDOMETRIUM 08/11/2007  . HYPERTHYROIDISM 05/16/2007  . HYPERTENSION 05/16/2007  . OSTEOARTHRITIS 05/16/2007   SLP - End of Session Activity Tolerance: Patient tolerated treatment well General Behavior During Therapy: WFL for tasks assessed/performed  SLP Assessment/Plan Clinical Impression Statement: Melissa [name] was accompanied to therapy by her husband and she brought a notebook to keep her therapy materials in. Homework was completed with some assist from spouse. Wh- questions directed to pt yielded short responses and she needed cueing to expand on utterances. Husband is quick to jump in with help (not giving answer but heavy cues). She answered structured "Wh" questions that required single word responses with 8/11 correct. Perseveration continues to hinder performance. Concrete naming activities were completed with mod/max cues beyond automatics (#s, days, months). Pt did well when cued to describe pictured object when given specific criteria to answer. Barrier tasks that shielded clinician's view were much harder and pt tended to simply name the picture rather than describe. Additional homework given. Continue plan of care. Speech Therapy Frequency: min 2x/week Duration: 4 weeks Treatment/Interventions: Language facilitation;Compensatory techniques;Cueing hierarchy;SLP instruction and  feedback;Compensatory strategies;Patient/family education Potential to Achieve Goals: Good Potential Considerations: Ability  to learn/carryover information;Severity of impairments     Thank you,  Genene Churn, CCC-SLP [phone]  Ephraim Hamburger 01/11/2014, 2:57 PM

## 2014-01-14 ENCOUNTER — Ambulatory Visit (HOSPITAL_COMMUNITY)
Admission: RE | Admit: 2014-01-14 | Discharge: 2014-01-14 | Disposition: A | Discharge status: Home / Self Care | Payer: 59 | Source: Ambulatory Visit | Attending: Internal Medicine | Admitting: Internal Medicine

## 2014-01-14 DIAGNOSIS — I619 Nontraumatic intracerebral hemorrhage, unspecified: Secondary | ICD-10-CM

## 2014-01-14 NOTE — Progress Notes (Signed)
Physical Therapy Treatment Patient Details  Name: Melissa [name] MRN: [id number] Date of Birth: [birth year]  Today's Date: 01/14/2014 Time: 7062-3762 PT Time Calculation (min): 38 min Charges: NMR x 20' [phone]) Therex x 10' [phone])  Visit#: 6 of 12  Re-eval: 01/27/14  Authorization: UMR  Authorization Visit#: 6 of 12   Subjective: Symptoms/Limitations Symptoms: Pt has no complaints today. Pt's husband says that the pt has taken two tylenol before the session. Pt still reports pain in L knee ((3/10) due to arthritis. Pain Assessment Currently in Pain?: No/denies   Exercise/Treatments  Standing Forward Lunges: Both;15 reps;Limitations Forward Lunges Limitations: With reach Functional Squat: 10 reps;Limitations Functional Squat Limitations: no HHA Lunge Walking - Round Trips: 2 RT   Balance Exercises Standing Tandem Gait: 2 reps;Limitations Tandem Gait Limitations: Heel walks Sidestepping: 1 rep;Limitations Sidestepping Limitations: Heel walks Numbers 1-15: Balance Beam;1 rep Marching: Foam;Limitations Marching Limitations: 10 reps with HHA; 15 reps with intermittant HHA Other Standing Exercises: Hip abduction/extension x 10 BLE   Physical Therapy Assessment and Plan PT Assessment and Plan Clinical Impression Statement: Pt completed therex well after inital multimodal cueing and demo. Pt required frequent rest breaks secondary to decreased activity tolerance and one restroom break. SPTA held off on balance beam gait training due to decreased pt confidence. Began marching on foam  to improve balance on dynamic surface. Began forward lunges with reach to increase LE  strength and trunk rotation. Began lunge walking to improve balance during gait. Continued LE strengthening and numbers 1-15 to increase LE strength and balance on dynmaic surfaces.  Pt reports nauseous feeling during treatment secondary to possible overexertion. Nausea subsided with rest.This entire  session was guided, instructed, and directly supervised by Rachelle Hora, PTA. PT Plan: Continue to progress dynamic balance and core stability.  Continue lunges and squats next session. Continue lunge walking to improve balance next session.     Problem List Patient Active Problem List   Diagnosis Date Noted  . Aphasia due to recent cerebrovascular accident 01/07/2014  . 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 ADENOCARCINOMA, ENDOMETRIUM 08/11/2007  . HYPERTHYROIDISM 05/16/2007  . HYPERTENSION 05/16/2007  . OSTEOARTHRITIS 05/16/2007    PT - End of Session Equipment Utilized During Treatment: Gait belt Activity Tolerance: Patient limited by fatigue;Patient tolerated treatment well General Behavior During Therapy: [facility] for tasks assessed/performed   Ahmed Prima, SPTA 01/14/2014, 1:54 PM

## 2014-01-14 NOTE — Progress Notes (Signed)
Occupational Therapy Treatment Patient Details  Name: Melissa [name] MRN: [id number] Date of Birth: 08-30-1951  Today's Date: 01/14/2014 Time: 5621-3086 OT Time Calculation (min): 40 min Self care 40' Visit#: 4 of 12  Re-eval: 01/25/14    Authorization: UMR  Subjective  S: I dont usually bake, if I do I bake chocolate chip cookies.  Pain Assessment Currently in Pain?: No/denies  Precautions/Restrictions     Exercise/Treatments     Activities of Daily Living Activities of Daily Living: Treatment focus was on preparing a simple 3 step cookie mix to assess safety, processing, and independence with IADL task.  Patient requested we make chocolate chip cookies, however chocolate chip cookie mix unavailable so snickerdoodle mix was used.  Patient was oriented to ADL kitchen by OT.  I  instructed her to wash her hands, read over instructions and begin.  She read instructions, got out bowl and poured mix, water and unmelted butter into bowl.  she began to mix with her hand and then switched to a spatula.  After mixing for a few minutes, she stated she should have melted the butter.  She requested to sit after 10 minutes as she was tiring.  She continued to mix cookie mix and asked therapist if it was mixed enough.  I instructed her to read step 2 and tell me if it was mixed enough.  She said yes and in fact it was not.  I recomended use of a wooden spoon to finish mixing to proper consistency.  She then made into balls  although recipe called for teaspoon drops.  OT max vg to read step 2 4 times to determine next steps and then told her we needed a bowl for sugar and a cookie sheet.  She retrieved these supplies and then placed sugar covered balls on tray.  She then discovered that she had not turned on oven.  Max vg to set oven properly.  She washed dishes while oven preheated and then placed  tray in oven with cues to turn horizontally so door would close.  I asked how long the cookies needed  cooked and she required mod vg to determine how many minutes to cook the cookies.   Occupational Therapy Assessment and Plan OT Assessment and Plan Clinical Impression Statement: A: Patient required mod-max  vg with all steps of preparing cookies.  May partially be contributed to unfamiliar cookie type, however she did not follow instructions on bag unless max vg given.  Errors including failed to preheat oven, did not melt butter, skimmed the recipe rather than reading carefully, made balls vs drops of batter, did not place cookie sheet into oven correctly.  Patient left quickly after session, requesting that we discuss this actiivty at next session.   OT Plan: P:  Follow up on cooking task, small pegboard for visual motor integration difficulties.  Discuss and complete routine IADL.   Goals Short Term Goals Time to Complete Short Term Goals: 3 weeks Short Term Goal 1: Patient will be educated on a HEP. Short Term Goal 2: Patient will improve right grip strength by 5 pounds and pinch strength by 2 pounds in order to open containers when cooking with increased independence  Short Term Goal 3: Patient will improve fine motor coordination by decreasing time on Nine Hole Peg Test by 2 seconds or more.  Short Term Goal 4: Patient will have good sustained activity tolerance.  Long Term Goals Time to Complete Long Term Goals: 6 weeks Long Term  Goal 1: Patient will return to prior level of independence with B/IADLs and leisure activities.  Long Term Goal 2: Patient will improve right grip strength by 10 pounds and pinch strength by 5 pounds in order to open containers when cooking with increased independence  Long Term Goal 3: Patient will improve fine motor coordination by decreasing time on Nine Hole Peg Test by 4 seconds or more.  Long Term Goal 4: Patient will have good + sustained activity tolerance.  Long Term Goal 5: Patient will demonstate good typing and writing skills.   Problem  List Patient Active Problem List   Diagnosis Date Noted  . Aphasia due to recent cerebrovascular accident 01/07/2014  . 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 ADENOCARCINOMA, ENDOMETRIUM 08/11/2007  . HYPERTHYROIDISM 05/16/2007  . HYPERTENSION 05/16/2007  . OSTEOARTHRITIS 05/16/2007    End of Session Activity Tolerance: Patient tolerated treatment well General Behavior During Therapy: [facility] for tasks assessed/performed  [place], OTR/L 01/14/2014, 9:52 PM

## 2014-01-18 ENCOUNTER — Telehealth: Payer: Self-pay | Admitting: *Deleted

## 2014-01-18 ENCOUNTER — Ambulatory Visit (HOSPITAL_COMMUNITY)
Admission: RE | Admit: 2014-01-18 | Discharge: 2014-01-18 | Disposition: A | Discharge status: Home / Self Care | Payer: 59 | Source: Ambulatory Visit | Attending: Internal Medicine | Admitting: Internal Medicine

## 2014-01-18 NOTE — Progress Notes (Signed)
Occupational Therapy Treatment Patient Details  Name: Melissa [name] MRN: [id number] Date of Birth: 06-14-1951  Today's Date: 01/18/2014 Time: 1105-1150 OT Time Calculation (min): 45 min Cognitive retraining 1125-1150 25' ADL 5631-4970 20'  Visit#: 5 of 12  Re-eval: 01/25/14    Authorization: UMR   Subjective  S:  I think the cooking task didnt go well because I didnt feel well.  Precautions/Restrictions     Exercise/Treatments Cognitive Exercises Deductive Reasoning: patient given task of writing down ingredients for corn bread recipe.  She had max difficulty recalling what 3 ingredients she needed.  Patient stated that she would figure it out at home and would bring supplies.    Visual Motor Integration: copied one simple and one minimally complex peg design this date.  Made one error on simple design that she was able to self correct, and completed the minimally difficulty design with one cue for color choice (she thought the red color on the design was brown instead of red)     Activities of Daily Living Activities of Daily Living: reviewed previous session with patient regarding cooking task.  Patient felt the session did not go well because she didnt feel well, and admitted that she has a tendency to skip entire sentences when reading.  We discussed at length the importance of reading slowly and thoroughly for proper comprehension and ability to follow instructions.  She stated that not melting the butter was a big mistake that she made, and that we did not have anything to melt the butter in.  I asked her how she would normally melt butter and she stated a microwave - we do have one, she did not ask if we did.  I probed her with second question of how else you could melt a stick of butter and she had max difficulty coming to the conclusion that you could melt it on the stove.  We also discussed a safety issue that occurred at home - she placed plastic in the oven - and why that  would be a safety hazard.    Occupational Therapy Assessment and Plan OT Assessment and Plan Clinical Impression Statement: A:  Patient had max difficulty listing out ingredients needed to make cornbread.  Patient was able to copy a easy and minimal difficult design on pegboard with relative ease. OT Plan: P:  Complete preparation of a known recipe - cornbread - assessing ability to process, problem solve, etc.   Goals Short Term Goals Short Term Goal 1: Patient will be educated on a HEP. Short Term Goal 1 Progress: Progressing toward goal Short Term Goal 2: Patient will improve right grip strength by 5 pounds and pinch strength by 2 pounds in order to open containers when cooking with increased independence  Short Term Goal 2 Progress: Progressing toward goal Short Term Goal 3: Patient will improve fine motor coordination by decreasing time on Nine Hole Peg Test by 2 seconds or more.  Short Term Goal 3 Progress: Progressing toward goal Short Term Goal 4: Patient will have good sustained activity tolerance.  Short Term Goal 4 Progress: Progressing toward goal Long Term Goals Long Term Goal 1: Patient will return to prior level of independence with B/IADLs and leisure activities.  Long Term Goal 1 Progress: Progressing toward goal Long Term Goal 2: Patient will improve right grip strength by 10 pounds and pinch strength by 5 pounds in order to open containers when cooking with increased independence  Long Term Goal 2 Progress: Progressing toward  goal Long Term Goal 3: Patient will improve fine motor coordination by decreasing time on Nine Hole Peg Test by 4 seconds or more.  Long Term Goal 3 Progress: Progressing toward goal Long Term Goal 4: Patient will have good + sustained activity tolerance.  Long Term Goal 5: Patient will demonstate good typing and writing skills.  Long Term Goal 5 Progress: Progressing toward goal  Problem List Patient Active Problem List   Diagnosis Date Noted   . Aphasia due to recent cerebrovascular accident 01/07/2014  . 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 ADENOCARCINOMA, ENDOMETRIUM 08/11/2007  . HYPERTHYROIDISM 05/16/2007  . HYPERTENSION 05/16/2007  . OSTEOARTHRITIS 05/16/2007    End of Session Activity Tolerance: Patient tolerated treatment well General Behavior During Therapy: [facility] for tasks assessed/performed  [place], OTR/L [phone]  01/18/2014, 1:59 PM

## 2014-01-18 NOTE — Telephone Encounter (Signed)
pts husband called and stated that pts has bilateral ankle edema.  He states it worsens when pt has been standing or walking.  Per Dr Arnoldo Morale pt is to cut the amlodipine 10 mg in half and give it to patient once the morning and once in the evening.  Called Joneen Caraway back and told him what Dr Arnoldo Morale said and stated if it didn't get better then he will call back and schedule an appt with Dr Leanne Chang.

## 2014-01-20 NOTE — Telephone Encounter (Signed)
Pt's husband faxed bp readings.  Dr Leanne Chang wanted to see them.  Per Dr Leanne Chang decrease the amlodipine to 5 mg once daily and have pt keep an eye on her ankle edema and BP readings.  Called pts husband and let him know Dr Leanne Chang advice.  He will let us know if edema doesn't get better and/or bp goes up.

## 2014-01-21 ENCOUNTER — Ambulatory Visit (HOSPITAL_COMMUNITY)
Admission: RE | Admit: 2014-01-21 | Discharge: 2014-01-21 | Disposition: A | Discharge status: Home / Self Care | Payer: 59 | Source: Ambulatory Visit | Attending: Internal Medicine | Admitting: Internal Medicine

## 2014-01-21 DIAGNOSIS — I69320 Aphasia following cerebral infarction: Secondary | ICD-10-CM

## 2014-01-21 NOTE — Progress Notes (Signed)
Speech Language Pathology Treatment Patient Details  Name: Melissa [name] MRN: [id number] Date of Birth: [birth year]  Today's Date: 01/21/2014 Time:  -     Authorization: [employer]  Authorization Time Period: 12/28/2013- 01/25/2014  Authorization Visit#:   of     HPI:  Symptoms/Limitations Symptoms: Doing well. Pain Assessment Currently in Pain?: No/denies   Treatment  Cognitive-Linguistic Therapy Aphasia Therapy Patient/Family Education Home Exercise Program  SLP Goals  Home Exercise SLP Goal: Patient will Perform Home Exercise Program: with supervision, verbal cues required/provided SLP Goal: Perform Home Exercise Program - Progress: Progressing toward goal SLP Short Term Goals SLP Short Term Goal 1: Pt will name 5+ items in concrete category with mod/max cueing. SLP Short Term Goal 1 - Progress: Progressing toward goal SLP Short Term Goal 2: Pt will implement word finding strategies in structured tasks with 80% acc and mod assist (ie. provide verbal descriptions/function). SLP Short Term Goal 2 - Progress: Progressing toward goal SLP Short Term Goal 3: Pt will verbalize 4+ word sentence when describing pictures with use of carrier phrase as needed on 8/10 trials with mod cues. SLP Short Term Goal 3 - Progress: Progressing toward goal SLP Short Term Goal 4: Pt will increase auditory comprehension for moderate level y/n questions to 75% with repetition of question as needed. SLP Short Term Goal 4 - Progress: Progressing toward goal SLP Short Term Goal 5: Pt will refer to memory book/planner to answer personal/bio questions in regards to circumstance and daily activities. SLP Short Term Goal 5 - Progress: Progressing toward goal SLP Long Term Goals SLP Long Term Goal 1: Increase expressive communication to [facility] for moderately complex information with use of strategies and min assist. SLP Long Term Goal 1 - Progress: Progressing toward goal SLP Long Term Goal 2:  Utilize compensatory strategies for memory and verbal expression to [facility] with min assist. SLP Long Term Goal 2 - Progress: Progressing toward goal SLP Long Term Goal 3: Increase auditory comprehension to [facility] for moderately complext information with use of strategies and indirect cues.  SLP Long Term Goal 3 - Progress: Progressing toward goal  Assessment/Plan  Patient Active Problem List   Diagnosis Date Noted  . Aphasia due to recent cerebrovascular accident 01/07/2014  . 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 ADENOCARCINOMA, ENDOMETRIUM 08/11/2007  . HYPERTHYROIDISM 05/16/2007  . HYPERTENSION 05/16/2007  . OSTEOARTHRITIS 05/16/2007   General Behavior During Therapy: [facility] for tasks assessed/performed  Pt's husband accompanied her to therapy today. She is going to make cornbread in OT after session so sequencing of task reviewed. Pt unable to label the three ingredients and perseverates on "cornbread" when intending butter. She continues to get frustrated when she cannot think of the exact word she wants to use and needs encouragement to utilize compensatory strategies (ie. Describe). Continue with plan of care- assess money management.      Thank you,  Genene Churn, [place]   Ephraim Hamburger 01/21/2014, 3:44 PM

## 2014-01-21 NOTE — Progress Notes (Signed)
Physical Therapy Treatment Patient Details  Name: Melissa [name] MRN: [id number] Date of Birth: Jan 28, 1951  Today's Date: 01/21/2014 Time: 1020-1100 PT Time Calculation (min): 40 min Visit#: 7 of 12  Re-eval: 01/27/14 Authorization: UMR  Authorization Visit#: 7 of 12  Charges:  NMR 38'  Subjective: Pt reports no pain or difficulties completing activities around her home.  No falls or near falls reported.  Precautions/Restrictions     Exercise/Treatments Balance Exercises Standing Tandem Gait: 2 reps [place] Rotation: Foam;A/P;R/L Marching: Foam;Limitations Marching Limitations: 10 reps with HHA; 15 reps without HHA Heel Raises: Limitations Heel Raises Limitations: heelwalk 1RT Toe Raise: Limitations Toe Raise Limitations: toewalk 1RT Other Standing Exercises: Hip abduction/extension x 15 BLE Other Standing Exercises: squat with UE diagonals with 2# weights  Seated Other Seated Exercises: NuStep 8' level 3 hills #3    Physical Therapy Assessment and Plan PT Assessment and Plan Clinical Impression Statement: Pt requiring less rest breaks with decreased LOB noted with activities.  Pt with most difficutly completing activities that involve coordinating movements between UE/LE.  Pt required therapist faciilitation to complete.  Pt only required 2 seated rest breaks during session today.  Minimal LOB with ability to self correct.  PT Plan: Continue to progress dynamic balance and core stability.  resume progressive lunges next session.     Problem List Patient Active Problem List   Diagnosis Date Noted  . Aphasia due to recent cerebrovascular accident 01/07/2014  . 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 ADENOCARCINOMA, ENDOMETRIUM 08/11/2007  . HYPERTHYROIDISM 05/16/2007  . HYPERTENSION 05/16/2007  . OSTEOARTHRITIS 05/16/2007    PT - End of Session Equipment Utilized During Treatment: Gait belt Activity Tolerance: Patient limited by fatigue;Patient tolerated  treatment well General Behavior During Therapy: [facility] for tasks assessed/performed   Teena Irani, PTA/CLT 01/21/2014, 11:05 AM

## 2014-01-21 NOTE — Progress Notes (Signed)
Occupational Therapy Treatment Patient Details  Name: Melissa [name] MRN: [id number] Date of Birth: April 09, 1951  Today's Date: 01/21/2014 Time: 3235-5732 OT Time Calculation (min): 42 min ADL: 2025-4270 (25') Cognitive Skills: 6237-6283 (17')  Visit#: 6 of 12  Re-eval: 01/25/14    Authorization: UMR  Authorization Time Period:    Authorization Visit#:   of    Subjective Symptoms/Limitations Symptoms: "I think i did fine." (referring to today's task of making cornbread) Pain Assessment Currently in Pain?: No/denies  Precautions/Restrictions     Exercise/Treatments Cognitive Exercises Maze: Pt initated drawing of maz by following arrow straight downward.  Follow path of maze well, staying within path.  upon reaching a maze end, pt realized her mistake and after slight perseveration at maze end began tracing her way back through the maze.  Pt was able to fine one alternate path to trace, and became frustrated when it was a short path with a dead end.  pt continued perseverating on middle and end of maze track to find alternate path.  with verbal cues, and the more specific verbal and gestural cueing pt was able to intiate beginning of maze path as a turn to the right.  pt again began folliwng maze path, but became frustrated as she found another dead end.  At this point it ws time to check on pt's cornbread and maze task ended for this session.   Visual Motor Integration: Copied three minimally complex peg design this session.  In first two designd pt made one error, but self-corrected upon scanning the design when finished.  In thried design pt made no errors, but was perseverating on finding the correct corlos. For last peg OT asked what color pt was searning for - when pt stated 'purple' she was able tofind purple peg without difficulty.  for all thee design pt required extra time to complete and did not seem to be following a pattern to copy design.     Activities of Daily  Living Activities of Daily Living: Began session with pt making cornbread.  Pt did not wash hands prior to beginning, but rather unloaded entire bag and immediatly began pouring ingredients into measuring cup.  Pt stated (about a quarter) when asked about amounts, with amount of cornmeal in cup being about a cup and a half.  Pt could not verbalize what she was looking for for consistency in making  batter. or what she ws doing in spreading oil on pan prior to baking.  When asked, pt vrbalized that she was going to cook the cornbread on the stovetop, but upon time to cook bread she stated need to palce it in the over.  Pt did inot pre-head oven, but problem-solved ability to cleanup while waiting for oven to pre-heat.  pt ony grabbed one spatula to wash, and only rememebered measuring cup and second spatula with prompting.  prior to washing the last of the three items, pt stopped and with wet hands began putting cooking items back into the bag she brought them with.  With prompintg, pt remembered to continue the dishwashing.  pt verbalized the need to cook cornbread for twenty minutes, and when alrm went off pt knew what it was for.  Pt paused before reaching into oven. When asked what she needed, pt made hand motion of opennig and closing.  Pt verbalized need to find something to put cornbread on - husband stated a plate was needed to flip bread onto.  pt persisted in searhing cabinets below -  pt found baking pan and decided it was a plate to use.  Pt began trying to wash baking dish immediately, and with prompting realized it needed to call. Pt requested OT to wash dish after it cools and decided the session was done.  Occupational Therapy Assessment and Plan OT Assessment and Plan Clinical Impression Statement: Pt demonstrated good follow-through with familiar recipe, but had difficulties with problem-solving and seuencing in tasks involved.  Pt perseverated on parts of maze, but did initiate good strategy of  tracing back through turns taken. OT Plan: Take trip to gift shop and attempt to buy one item.   Goals Short Term Goals Short Term Goal 1: Patient will be educated on a HEP. Short Term Goal 1 Progress: Progressing toward goal Short Term Goal 2: Patient will improve right grip strength by 5 pounds and pinch strength by 2 pounds in order to open containers when cooking with increased independence  Short Term Goal 2 Progress: Progressing toward goal Short Term Goal 3: Patient will improve fine motor coordination by decreasing time on Nine Hole Peg Test by 2 seconds or more.  Short Term Goal 3 Progress: Progressing toward goal Short Term Goal 4: Patient will have good sustained activity tolerance.  Short Term Goal 4 Progress: Progressing toward goal Long Term Goals Long Term Goal 1: Patient will return to prior level of independence with B/IADLs and leisure activities.  Long Term Goal 1 Progress: Progressing toward goal Long Term Goal 2: Patient will improve right grip strength by 10 pounds and pinch strength by 5 pounds in order to open containers when cooking with increased independence  Long Term Goal 2 Progress: Progressing toward goal Long Term Goal 3: Patient will improve fine motor coordination by decreasing time on Nine Hole Peg Test by 4 seconds or more.  Long Term Goal 3 Progress: Progressing toward goal Long Term Goal 4: Patient will have good + sustained activity tolerance.  Long Term Goal 4 Progress: Progressing toward goal Long Term Goal 5: Patient will demonstate good typing and writing skills.  Long Term Goal 5 Progress: Progressing toward goal  Problem List Patient Active Problem List   Diagnosis Date Noted  . Aphasia due to recent cerebrovascular accident 01/07/2014  . 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 ADENOCARCINOMA, ENDOMETRIUM 08/11/2007  . HYPERTHYROIDISM 05/16/2007  . HYPERTENSION 05/16/2007  . OSTEOARTHRITIS 05/16/2007    End of Session Activity  Tolerance: Patient tolerated treatment well General Behavior During Therapy: [facility] for tasks assessed/performed  GO    Bea Graff, MS, OTR/L [phone]  01/21/2014, 1:12 PM

## 2014-01-25 ENCOUNTER — Ambulatory Visit (HOSPITAL_COMMUNITY)
Admission: RE | Admit: 2014-01-25 | Discharge: 2014-01-25 | Disposition: A | Discharge status: Home / Self Care | Payer: 59 | Source: Ambulatory Visit | Attending: Internal Medicine | Admitting: Internal Medicine

## 2014-01-25 DIAGNOSIS — I69320 Aphasia following cerebral infarction: Secondary | ICD-10-CM

## 2014-01-25 NOTE — Progress Notes (Signed)
Physical Therapy Treatment Patient Details  Name: Melissa [name] MRN: [id number] Date of Birth: March 29, 1951  Today's Date: 01/25/2014 Time: 1016-1056 PT Time Calculation (min): 40 min Charges: Therex x 10' (1016-1026) Gait x 10' (1026-1036) NMR x 16' (1040-1056)  Visit#: 8 of 12  Re-eval: 01/27/14  Authorization: [employer]  Authorization Visit#: 8 of 12   Subjective: Symptoms/Limitations Symptoms: Pt reports that she may be ready to be dismissed from therapies by the end of the week. Pt's husband states that therapy discharge is not his wish. Pain Assessment Currently in Pain?: No/denies   Exercise/Treatments  Standing Lunge Walking - Round Trips: Forward 2 RT on carpet; Side lunge walking 2 RT on carpet Stairs: 2 RT in stairwell Rocker Board: 2 minutes;Limitations Rocker Board Limitations: A/P R/L with one HHA/ no HHA   Balance Exercises Standing Numbers 1-15: Balance Beam;1 rep   Physical Therapy Assessment and Plan PT Assessment and Plan Clinical Impression Statement: SPTA faciliatated stair training, dynamic balance training and LE strengthening exercises to improve functional independence.  Began stair training x 2 RT to improve gait independence with stairs considering [place]. Continued forward lunge walking and side lunge walking to improve LE strength. Continued numbers 1-15 with balance beam to improve dynamic balance. Continued rockerboard  to improve LE weigh shifting and proprioceptive control. Pt required several rest breaks during session secondary to decreased activity tolerance. Pt required multimodal cueing and demo to complete therex well. Pt denied pain after session only fatigue. This entire session was guided, instructed, and directly supervised by Rachelle Hora, PTA. PT Plan: Reassess next session.     Problem List Patient Active Problem List   Diagnosis Date Noted  . Aphasia due to recent cerebrovascular accident 01/07/2014  .  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 ADENOCARCINOMA, ENDOMETRIUM 08/11/2007  . HYPERTHYROIDISM 05/16/2007  . HYPERTENSION 05/16/2007  . OSTEOARTHRITIS 05/16/2007    PT - End of Session Equipment Utilized During Treatment: Gait belt Activity Tolerance: Patient limited by fatigue General Behavior During Therapy: [facility] for tasks assessed/performed   Ahmed Prima, SPTA Rachelle Hora, PTA 01/25/2014, 11:45 AM

## 2014-01-25 NOTE — Progress Notes (Signed)
Occupational Therapy Treatment Patient Details  Name: Melissa [name] MRN: [id number] Date of Birth: 1951/03/12  Today's Date: 01/25/2014 Time: 4782-9562 OT Time Calculation (min): 44 min ADL 1105-1140 35' Cognitive retraining 1140-1149 9'  Visit#: 7 of 12  Re-eval: 01/25/14    Authorization: UMR  Authorization Time Period:    Authorization Visit#:   of    Subjective Symptoms/Limitations Symptoms: S:  I am not sure that I want to continue therapy any longer.  I will let you know Thursday.  (discussed in depth with patient and her husband the importance of continuing therapy to improve on her cognitive deficits needed to stay home by herself.  Currently, patient has a woman staying with her and is not pleased with this situation.   Pain Assessment Currently in Pain?: No/denies  Precautions/Restrictions     Exercise/Treatments Cognitive Exercises Visual Motor Integration: copied a moderately complex peg board diagram indepdendently.       Activities of Daily Living Activities of Daily Living: Patient given task of going to the giftshop to purchase 2 birthday cards.  Patient was given money to use and directed how to locate the gift shop.  She walked to the gift shop and requested to rest before entering the gift shop.  Upon entering the gift shop, she was able to locate and select 2 birthday cards.  She took the cards to the cashier and the total was $2.14.  She gave the cashier a $20.  The cashier stated that she may not have enough change for the $20.  Melissa [name] went out to the lobby and asked her husband for a $5.00 to help the cashier out.  She gave the $5.00 to the cashier and was given back the original $20 and her change.  Melissa [name] asked about the other $20 and then remembered that it was in her pocket.  We then returned to the clinic.  Melissa [name] then made a list of all people who had given or made them something since her stroke so that she could write thank you notes.   Occupational  Therapy Assessment and Plan OT Assessment and Plan Clinical Impression Statement: A:  Patient completed trip to gift shop to purchase 2 cards while therapist observed.  Therapist did not need to interject during trip. OT Plan: P: Reassess.   Goals Short Term Goals Short Term Goal 1: Patient will be educated on a HEP. Short Term Goal 2: Patient will improve right grip strength by 5 pounds and pinch strength by 2 pounds in order to open containers when cooking with increased independence  Short Term Goal 3: Patient will improve fine motor coordination by decreasing time on Nine Hole Peg Test by 2 seconds or more.  Short Term Goal 4: Patient will have good sustained activity tolerance.  Long Term Goals Long Term Goal 1: Patient will return to prior level of independence with B/IADLs and leisure activities.  Long Term Goal 2: Patient will improve right grip strength by 10 pounds and pinch strength by 5 pounds in order to open containers when cooking with increased independence  Long Term Goal 3: Patient will improve fine motor coordination by decreasing time on Nine Hole Peg Test by 4 seconds or more.  Long Term Goal 4: Patient will have good + sustained activity tolerance.  Long Term Goal 5: Patient will demonstate good typing and writing skills.   Problem List Patient Active Problem List   Diagnosis Date Noted  . Aphasia due to recent cerebrovascular accident 01/07/2014  .  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 ADENOCARCINOMA, ENDOMETRIUM 08/11/2007  . HYPERTHYROIDISM 05/16/2007  . HYPERTENSION 05/16/2007  . OSTEOARTHRITIS 05/16/2007    End of Session Activity Tolerance: Patient tolerated treatment well General Behavior During Therapy: [facility] for tasks assessed/performed  [place], OTR/L 01/25/2014, 11:50 AM

## 2014-01-26 NOTE — Progress Notes (Signed)
Speech Language Pathology Treatment Patient Details  Name: Melissa [name] MRN: [id number] Date of Birth: 08/12/1951  Today's Date: 01/25/2014 Time: 9:30 AM - 10:15 AM    Authorization: [employer]  Authorization Time Period: 12/28/2013- 01/28/2014  Authorization Visit#:  7 of  8   HPI:        Treatment  Cognitive-Linguistic Therapy Aphasia Therapy Patient/Family Education Home Exercise Program  SLP Goals  Home Exercise SLP Goal: Patient will Perform Home Exercise Program: with supervision, verbal cues required/provided SLP Goal: Perform Home Exercise Program - Progress: Progressing toward goal SLP Short Term Goals SLP Short Term Goal 1: Pt will name 5+ items in concrete category with mod/max cueing. SLP Short Term Goal 1 - Progress: Progressing toward goal SLP Short Term Goal 2: Pt will implement word finding strategies in structured tasks with 80% acc and mod assist (ie. provide verbal descriptions/function). SLP Short Term Goal 2 - Progress: Progressing toward goal SLP Short Term Goal 3: Pt will verbalize 4+ word sentence when describing pictures with use of carrier phrase as needed on 8/10 trials with mod cues. SLP Short Term Goal 3 - Progress: Progressing toward goal SLP Short Term Goal 4: Pt will increase auditory comprehension for moderate level y/n questions to 75% with repetition of question as needed. SLP Short Term Goal 4 - Progress: Progressing toward goal SLP Short Term Goal 5: Pt will refer to memory book/planner to answer personal/bio questions in regards to circumstance and daily activities. SLP Short Term Goal 5 - Progress: Progressing toward goal SLP Long Term Goals SLP Long Term Goal 1: Increase expressive communication to [facility] for moderately complex information with use of strategies and min assist. SLP Long Term Goal 1 - Progress: Progressing toward goal SLP Long Term Goal 2: Utilize compensatory strategies for memory and verbal expression to [facility]  with min assist. SLP Long Term Goal 2 - Progress: Progressing toward goal SLP Long Term Goal 3: Increase auditory comprehension to [facility] for moderately complext information with use of strategies and indirect cues.  SLP Long Term Goal 3 - Progress: Progressing toward goal  Assessment/Plan  Patient Active Problem List   Diagnosis Date Noted  . Aphasia due to recent cerebrovascular accident 01/07/2014  . 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 ADENOCARCINOMA, ENDOMETRIUM 08/11/2007  . HYPERTHYROIDISM 05/16/2007  . HYPERTENSION 05/16/2007  . OSTEOARTHRITIS 05/16/2007    Pt accompanied to therapy by her husband. She stated that she was ready to be finished with all her therapies. She does not think she needs them anymore. After further discussion with pt and her husband, she agrees that she probably does need more speech therapy and mostly wants more independence at home. She has someone staying with her while her husband works. They are going to have her stay by herself for a few hours this week while wearing a lifeline and will not use kitchen, which I think is appropriate. Although pt has made tremendous progress, her awareness is not fully intact. Continue with plan of care.       Thank you,  Genene Churn, [place]  Ephraim Hamburger 01/26/2014, 10:54 PM

## 2014-01-28 ENCOUNTER — Encounter: Payer: Self-pay | Admitting: Nurse Practitioner

## 2014-01-28 ENCOUNTER — Ambulatory Visit (INDEPENDENT_AMBULATORY_CARE_PROVIDER_SITE_OTHER): Payer: 59 | Admitting: Nurse Practitioner

## 2014-01-28 ENCOUNTER — Ambulatory Visit (HOSPITAL_COMMUNITY)
Admission: RE | Admit: 2014-01-28 | Discharge: 2014-01-28 | Disposition: A | Discharge status: Home / Self Care | Payer: 59 | Source: Ambulatory Visit | Attending: Internal Medicine | Admitting: Internal Medicine

## 2014-01-28 VITALS — BP 152/77 | HR 78 | Ht 67.0 in | Wt 270.0 lb

## 2014-01-28 DIAGNOSIS — I69320 Aphasia following cerebral infarction: Secondary | ICD-10-CM

## 2014-01-28 DIAGNOSIS — R7309 Other abnormal glucose: Secondary | ICD-10-CM

## 2014-01-28 DIAGNOSIS — I619 Nontraumatic intracerebral hemorrhage, unspecified: Secondary | ICD-10-CM

## 2014-01-28 DIAGNOSIS — I10 Essential (primary) hypertension: Secondary | ICD-10-CM

## 2014-01-28 NOTE — Evaluation (Signed)
Occupational Therapy Re-Evaluation and discharge  Patient Details  Name: Melissa [name] MRN: [id number] Date of Birth: 1951-03-13  Today's Date: 01/28/2014 Time: 1025-1107 OT Time Calculation (min): 42 min MMT 1025-1035  10' Self Care 1035-1050 (15')  Cognitive Skills 1050-1107 (61')  Visit#: 8 of 12  Re-eval:    Assessment Diagnosis: CVA with Right Sided Weakness Next MD Visit: Neurologist today, Hospital MD tomorrow, PCP in 2 weeks Prior Therapy: CIR OT, PT, ST  Authorization: UMR  Authorization Time Period:    Authorization Visit#:   of     Past Medical History:  Past Medical History  Diagnosis Date  . Hypertension   . Uterine cancer    Past Surgical History:  Past Surgical History  Procedure Laterality Date  . Replacement total knee bilateral    . Cholecystectomy    . Abdominal hysterectomy      Subjective Symptoms/Limitations Symptoms: S: "I think stronger with my hands.  That's a lot stronger." Pain Assessment Currently in Pain?: Yes Pain Score: 4  Pain Location: Knee Pain Orientation: Left Multiple Pain Sites: Yes  Precautions/Restrictions  Restrictions Weight Bearing Restrictions: No  Balance Screening Balance Screen Has the patient fallen in the past 6 months: No Has the patient had a decrease in activity level because of a fear of falling? : No Is the patient reluctant to leave their home because of a fear of falling? : No  Prior [place] expects to be discharged to:: Private residence Living Arrangements: Spouse/significant other;Children Available Help at Discharge: Family;Available 24 hours/day Type of Home: House Home Access: Stairs to enter [employer] of Steps: 2 Entrance Stairs-Rails: None Home Layout: One level  Lives With: Spouse Prior Function Level of Independence: Independent with basic ADLs;Independent with homemaking with ambulation;Independent with gait;Independent with  transfers  Able to Take Stairs?: Yes Driving: Yes Vocation: Full time employment Vocation Requirements: nurse standing Leisure: Hobbies-yes (Comment) Comments: crocheting and knitting going to the [place] ADL/Vision/Perception ADL ADL Comments: pt can don socks and shoes with no assist. Pt has set/up and supevision for shower transfers. pt uses 3 in 1 for seat in shower.  Sensation/Coordination/Edema Coordination Fine Motor Movements are Fluid and Coordinated: Yes Finger Nose Finger Test: No difficultiles, consistednt speed with RUE and LUE 9 Hole Peg Test: Right 19.43 (Previous right 20.25 and left 22.97)  Additional Assessments RUE Strength Grip (lbs): 59 (55) Lateral Pinch: 17 lbs (16) 3 Point Pinch: 16 lbs (16) Right Hand Strength - Pinch (lbs) Lateral Pinch: 17 lbs (16) 3 Point Pinch: 16 lbs (16)   Exercise/Treatments    Activities of Daily Living Activities of Daily Living: Asked pt to begin writing thank you note to one of the people discussed last session. pt required prompting from husband to come up with a name.  when typing the note pt perseverated on the 'th' of thank you, having max difficulty organizing the letters she needed. pt had difficulty manipulating backapace and arrows for the word 'thanks' but once she began typing she had imporved skillls. Pt demonstrated no motor deficits with her typing skills, only difficulty processinga nd sequencing the task and expressing her words.  Pt's message included "[facility]    Thanks for the card that you sent to is [place].   Thank you again.  Thank again "    Pt did not seems to notice the repititive nature of her note.  Occupational Therapy Assessment and Plan OT Assessment and Plan Clinical Impression Statement:  Re-Assessment completed this session. pt has made the decision that she no longer wishes to continue therapy. pt could benefit from further services, but pt is determined to only continue speech services.  pt has met 3/4 STG (partially meeting remaining goal) and 2/5 LTG (partially meeting 1/5 and progressing towards 2/5). Pt did not demonstrated significant mporovements with grip and pinch strength tests, but demonstrated functional improvements with ability to open mulltiple cans and jars. however, pt did have min difficulty and required extra time to match lids. When demonstrating her typing skills pt had no difficulty with the motor function, but did have max difficulty creating a thank you note. OT Plan: Pt had decided to discontinue OT services at this time.  OT services are d/c-ed.   Goals Home Exercise Program Pt/caregiver will Perform Home Exercise Program: For increased strengthening Short Term Goals Short Term Goal 1: Patient will be educated on a HEP. Short Term Goal 1 Progress: Met Short Term Goal 2: Patient will improve right grip strength by 5 pounds and pinch strength by 2 pounds in order to open containers when cooking with increased independence  Short Term Goal 2 Progress: Partly met Short Term Goal 3: Patient will improve fine motor coordination by decreasing time on Nine Hole Peg Test by 2 seconds or more.  Short Term Goal 3 Progress: Met Short Term Goal 4: Patient will have good sustained activity tolerance.  Short Term Goal 4 Progress: Met Long Term Goals Long Term Goal 1: Patient will return to prior level of independence with B/IADLs and leisure activities.  Long Term Goal 1 Progress: Met Long Term Goal 2: Patient will improve right grip strength by 10 pounds and pinch strength by 5 pounds in order to open containers when cooking with increased independence  Long Term Goal 2 Progress: Partly met Long Term Goal 3: Patient will improve fine motor coordination by decreasing time on Nine Hole Peg Test by 4 seconds or more.  Long Term Goal 3 Progress: Progressing toward goal Long Term Goal 4: Patient will have good + sustained activity tolerance.  Long Term Goal 4 Progress:  Met Long Term Goal 5: Patient will demonstate good typing and writing skills.  Long Term Goal 5 Progress: Progressing toward goal  Problem List Patient Active Problem List   Diagnosis Date Noted  . Aphasia due to recent cerebrovascular accident 01/07/2014  . 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 ADENOCARCINOMA, ENDOMETRIUM 08/11/2007  . HYPERTHYROIDISM 05/16/2007  . HYPERTENSION 05/16/2007  . OSTEOARTHRITIS 05/16/2007    End of Session Activity Tolerance: Patient tolerated treatment well General Behavior During Therapy: [facility] for tasks assessed/performed  GO    Bea Graff, MS, OTR/L [phone]  01/28/2014, 12:09 PM  Physician Documentation Your signature is required to indicate approval of the treatment plan as stated above.  Please sign and either send electronically or make a copy of this report for your files and return this physician signed original.  Please mark one 1.__approve of plan  2. ___approve of plan with the following conditions.   ______________________________                                                          _____________________ Physician Signature  Date

## 2014-01-28 NOTE — Patient Instructions (Signed)
PLAN: No antithrombotics due to recent ICH. Maintain strict control of hypertension with blood pressure goal below 130/90, diabetes with hemoglobin A1c goal below 6.5% and lipids with LDL cholesterol goal below 100 mg/dL.  Followup in the future with Dr. Leonie Man in 3 months.

## 2014-01-28 NOTE — Progress Notes (Addendum)
Speech Language Pathology Treatment/Progress Note Patient Details  Name: Melissa [name] MRN: [id number] Date of Birth: 17-Oct-1950  Today's Date: 01/28/2014 Time: 9924-2683 SLP Time Calculation (min): 44 min  Authorization: [name] Employee  Authorization Time Period: 12/28/2013- 01/28/2014  Authorization Visit#:  8 of 8   HPI:  Symptoms/Limitations Symptoms: Pt is agreeable to two more weeks of speech therapy. Pain Assessment Currently in Pain?: No/denies   Treatment  Cognitive-Linguistic Therapy Aphasia Therapy Patient/Family Education Home Exercise Program  SLP Goals  Home Exercise SLP Goal: Patient will Perform Home Exercise Program: with supervision, verbal cues required/provided SLP Short Term Goals SLP Short Term Goal 1: Pt will name 5+ items in concrete category with mod/max cueing. SLP Short Term Goal 1 - Progress: Met SLP Short Term Goal 2: Pt will implement word finding strategies in structured tasks with 80% acc and mod assist (ie. provide verbal descriptions/function). SLP Short Term Goal 2 - Progress: Met SLP Short Term Goal 3: Pt will verbalize 4+ word sentence when describing pictures with use of carrier phrase as needed on 8/10 trials with mod cues. SLP Short Term Goal 3 - Progress: Met SLP Short Term Goal 4: Pt will increase auditory comprehension for moderate level y/n questions to 75% with repetition of question as needed. SLP Short Term Goal 4 - Progress: Met SLP Short Term Goal 5: Pt will refer to memory book/planner to answer personal/bio questions in regards to circumstance and daily activities. SLP Short Term Goal 5 - Progress: Partly met Additional SLP Short Term Goals?: Yes SLP Short Term Goal 6: Pt will name 5+ items in abstract category with mi/mod cueing. SLP Short Term Goal 7: Pt will implement word finding strategies in structured tasks with 90% acc and mi/mod assist (ie. provide verbal descriptions/function). SLP Short Term Goal 8: Pt will  complete moderate level planning and deduction tasks with 90% acc and mod assist SLP Short Term Goal 9: Pt will verbally answer open ended questions with 90% acc for content when given mi/mod cues. SLP Short Term Goal 10: Pt will develop a daily schedule for home activities with min assist from SLP and husband to increase independence at home. SLP Long Term Goals SLP Long Term Goal 1: Increase expressive communication to [facility] for moderately complex information with use of strategies and min assist. SLP Long Term Goal 1 - Progress: Progressing toward goal SLP Long Term Goal 2: Utilize compensatory strategies for memory and verbal expression to [facility] with min assist. SLP Long Term Goal 2 - Progress: Progressing toward goal SLP Long Term Goal 3: Increase auditory comprehension to [facility] for moderately complext information with use of strategies and indirect cues.  SLP Long Term Goal 3 - Progress: Progressing toward goal  Assessment/Plan  Patient Active Problem List   Diagnosis Date Noted  . Aphasia due to recent cerebrovascular accident 01/07/2014  . 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 ADENOCARCINOMA, ENDOMETRIUM 08/11/2007  . HYPERTHYROIDISM 05/16/2007  . HYPERTENSION 05/16/2007  . OSTEOARTHRITIS 05/16/2007   SLP - End of Session Activity Tolerance: Patient limited by fatigue;Patient tolerated treatment well General Behavior During Therapy: [facility] for tasks assessed/performed  Melissa [name] was accompanied to therapy by her husband. They both agreed that [facility] would continue with speech therapy for another 2 weeks, but that she was ready to be finished with PT and OT. Continued OT is strongly recommended, however if pt is unwilling, we will continue to work on safety/problem solving in [place]. Melissa [name] has made significant progress in  the 4 weeks she has attended therapy and met all of her short term goals. She continues to experience word finding difficulties and needs cues to utilize  strategies to facilitate word retrieval. She often gets stuck and is unable to verbalize anything meaningful to assist the listener. Perseveration negatively impacts her verbal expression abilities. Goals updated above.  SLP Assessment/Plan Speech Therapy Frequency: min 2x/week Duration: 4 weeks Treatment/Interventions: Language facilitation;Compensatory techniques;Cueing hierarchy;SLP instruction and feedback;Compensatory strategies;Patient/family education Potential to Achieve Goals: Good Potential Considerations: Ability to learn/carryover information;Severity of impairments  Thank you,  Melissa [name], [place]     Melissa [name] 01/28/2014, 3:16 PM                                                     Physician Treatment Plan  Your signature is required to indicate approval of the treatment plan/progress as stated above. Please make a copy of this report for your files and return this physician signed original in the self-addressed envelope or fax to [phone]. COMMENTS/CHANGES:__________________________________________________________________________________________________________________________   ____________________________________                      ____________________ Melissa [name]                                                    DATE

## 2014-01-28 NOTE — Progress Notes (Signed)
Physical Therapy Treatment Patient Details  Name: Melissa [name] MRN: [id number] Date of Birth: 01/16/1951  Today's Date: 01/28/2014 Time: 7829-5621 PT Time Calculation (min): 24 min Charges: MMT/ROMM x 1 [phone])  Physical Performance Testing x 15' [phone])   Visit#: 9 of 12  Re-eval: 01/27/14    Authorization: [employer]  Authorization Visit#: 9 of 12   Subjective: Symptoms/Limitations Symptoms: Pt states that she feels alright. Pt only reports pain in L knee in which pt has arthritis in. Pt also states that her back is bothering her too. Both pain sites do not hurt constantly. Pain Assessment Currently in Pain?: Yes Pain Score: 4  Pain Location: Knee Pain Orientation: Left Multiple Pain Sites: Yes  Precautions/Restrictions  Restrictions Weight Bearing Restrictions: No  Exercise/Treatments Berg Balance Test Total Score: 55 Timed Up and Go Test TUG: Normal TUG Normal TUG (seconds): 13.4   RLE Strength  Right Hip Flexion 5/5 (was 5/5 on 12-28-2013)  Right Hip Extension 5/5 (was 4-/5 on 12-28-2013)  Right Hip ABduction 5/5 (was 4/5 on 12-28-2013)  Right Hip ADduction 5/5 (was 4/5 on 12-28-2013)  Right Knee Flexion 5/5 (was 4/5 on 12-28-2013)  Right Knee Extension 5/5 (was 5-/5 on 12-28-2013)  Right Ankle Dorsiflexion 5/5 (was 4/5 on 12-28-2013)  Right Ankle Plantar Flexion 5/5 (was 3+/5 on 12-28-2013)     Physical Therapy Assessment and Plan PT Assessment and Plan Clinical Impression Statement: Session focused on reassessment of goals and strength for discharge from PT. Pt has completed 9 sessions thus far. Pt's Merrilee Jansky test results were previously 44 on 12-28-2013, when tested today was 26. Pt's TUG was previously tested at 19.39 sec on 12-28-2013, when tested today was 13.40. Pt's strength in RLE has all improved to 5/5. Pt has met all short term goals and long term goals except for standing and walking for 30 minutes. These goals were not met secondary to pt  expressing knee pain. Pt required a few rest breaks during session. The entire session was guided, instructed, and directly supervised by Roseanne Reno, PTA. PT Plan: Recommend DC from PT.    Goals Home Exercise Program Pt/caregiver will Perform Home Exercise Program: For increased strengthening PT Short Term Goals Time to Complete Short Term Goals: 2 weeks PT Short Term Goal 1: Pt to be able to tolerate standing for 15 minutes to be able to complete light housework at home. PT Short Term Goal 1 - Progress: Met PT Short Term Goal 2: Pt to be able to tolerate walking with a cane x 15 minutes for improved health habits. PT Short Term Goal 2 - Progress: Met PT Short Term Goal 3: Berg balance to be improved 5  pt to allow pt to walk inside without an assistive device. PT Short Term Goal 3 - Progress: Met PT Long Term Goals Time to Complete Long Term Goals: 4 weeks PT Long Term Goal 1: Pt to be able to tolerate standing for 30 minutes to be able to make a meal PT Long Term Goal 1 - Progress: Other (comment) (Not met secondary to pt having knee pain. Pt feels as if she could meet this goal if she did not have the knee pain.) PT Long Term Goal 2: Pt to be able to walk for 30 mintues without sitting to allow short shopping trips PT Long Term Goal 2 - Progress: Progressing toward goal (Pt states she can go for about 15 minutes secondary to knee pain.) Long Term Goal 3: Berg to be improved by 10  pts to allow outside ambulation without an assistive device.  Long Term Goal 3 Progress: Met  Problem List Patient Active Problem List   Diagnosis Date Noted  . Aphasia due to recent cerebrovascular accident 01/07/2014  . 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 ADENOCARCINOMA, ENDOMETRIUM 08/11/2007  . HYPERTHYROIDISM 05/16/2007  . HYPERTENSION 05/16/2007  . OSTEOARTHRITIS 05/16/2007    PT - End of Session Activity Tolerance: Patient limited by fatigue General Behavior During Therapy: [facility] for  tasks assessed/performed   Ahmed Prima, SPTA 01/28/2014, 11:56 AM

## 2014-01-28 NOTE — Progress Notes (Signed)
PATIENT: Melissa [name] DOB: 31-May-1951  REASON FOR VISIT: hospital stroke follow up HISTORY FROM: patient  HISTORY OF PRESENT ILLNESS: Melissa [name] is a 63 y.o. female who was in her normal state of health on 12/02/2013. She had a dentist appt at 2:30 pm and then left to go home. She stopped by a [employer] to use the restroom, and after using it, sat down and became confused. EMS was called and she was taken to [facility] where a CT head showed a left thalamic hemorrhage with IVH. Of note, she has also noticed swelling in her legs for the past couple of days and her urine has proteinuria. Patient was not administerd TPA secondary to hemorrhage. She was admitted to the neuro ICU for further evaluation and treatment.  MRI of the brain showed acute hemorrhage centered in the left thalamus with extension into the left lateral ventricle. No hydrocephalus.  MRA of the brain Moderate chronic small vessel ischemic disease. No evidence of major intracranial arterial occlusion or high-grade stenosis. Mild bilateral carotid siphon atherosclerosis.   She comes in today for her first office visit post stroke. She has completed outpatient physical therapy and occupational therapy and is still attending speech therapy.  She does not have any residual weakness or numbness or vision problems. Expressive aphasia remains but is much better since discharge.  She states her blood pressure has been hard to control for years and she is working with Dr. Leanne Chang to keep it at goal.  Cholesterol has not been a problem for her. Her hemoglobin A1c in the hospital was elevated at 6.9. She has not been aware that her blood sugars been elevated in the past. Of note, she has lost 30 pounds since hospital admission.  REVIEW OF SYSTEMS: Full 14 system review of systems performed and notable only for:  confusion  ALLERGIES: No Known Allergies  HOME MEDICATIONS: Outpatient Prescriptions Prior to Visit  Medication Sig  Dispense Refill  . amLODipine (NORVASC) 10 MG tablet Take 1 tablet (10 mg total) by mouth daily.  30 tablet  1  . hydrochlorothiazide (MICROZIDE) 12.5 MG capsule Take 1 capsule (12.5 mg total) by mouth daily.  30 capsule  1  . irbesartan (AVAPRO) 300 MG tablet Take 1 tablet (300 mg total) by mouth daily.  30 tablet  1  . metoprolol (LOPRESSOR) 100 MG tablet Take 0.5 tablets (50 mg total) by mouth 2 (two) times daily.  60 tablet  1   No facility-administered medications prior to visit.   PHYSICAL EXAM  Filed Vitals:   01/28/14 1455  BP: 152/77  Pulse: 78  Height: 5\' 7"  (1.702 m)  Weight: 270 lb (122.471 kg)   Body mass index is 42.28 kg/(m^2).  Generalized: Well developed, in no acute distress, obese Caucasian female Head: normocephalic and atraumatic. Oropharynx benign  Neck: Supple, no carotid bruits  Cardiac: Regular rate rhythm, no murmur  Musculoskeletal: No deformity   Neurological examination  Mentation: Alert oriented to time, place, history taking. Follows all commands, speech with mild expressive aphasia with perseveration. Cranial nerve II-XII:  Pupils were equal round reactive to light extraocular movements were full, visual field were full on confrontational test. Facial sensation and strength were normal. hearing was intact to finger rubbing bilaterally. Uvula tongue midline. head turning and shoulder shrug and were normal and symmetric.Tongue protrusion into cheek strength was normal. Motor: The motor testing reveals 5 over 5 strength of all 4 extremities. Good symmetric motor tone is noted  throughout.  Sensory: Sensory testing is intact to pinprick, soft touch, vibration sensation, and position sense on all 4 extremities. No evidence of extinction is noted.  Coordination: Cerebellar testing reveals good finger-nose-finger and heel-to-shin bilaterally.  Gait and station: Gait is normal. Tandem gait is normal. Romberg is negative. No drift is seen.  Reflexes: Deep tendon  reflexes are symmetric and normal bilaterally. Toes are downgoing bilaterally.   DIAGNOSTIC DATA (LABS, IMAGING, TESTING) - I reviewed patient records, labs, notes, testing and imaging myself where available.  Lab Results  Component Value Date   HGBA1C 6.9* 12/03/2013    ASSESSMENT AND PLAN Melissa [name] is a 64 y.o. 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  Hemorrhage felt to be secondary to accelerated hypertension. On no antithrombotics prior to admission. Patient with resultant expressive aphasia with perseveration.  PLAN: Discussed patient's stroke and timeframe for recovery, viewed MRI images, and answered questions. Recommended weight loss for better hypertension control as well as reduction in blood sugar. No antithrombotics due to recent ICH. Maintain strict control of hypertension with blood pressure goal below 130/90, diabetes with hemoglobin A1c goal below 6.5% and lipids with LDL cholesterol goal below 100 mg/dL.  Continue outpatient speech therapy. Followup in the future with Dr. Leonie Man in 3 months.  Philmore Pali, MSN, NP-C 01/28/2014, 4:26 PM [facility] [address] [place], [place] [address] [phone]  Note: This document was prepared with digital dictation and possible smart phrase technology. Any transcriptional errors that result from this process are unintentional.

## 2014-01-29 ENCOUNTER — Encounter: Payer: 59 | Attending: Physical Medicine & Rehabilitation

## 2014-01-29 ENCOUNTER — Encounter: Payer: Self-pay | Admitting: Physical Medicine & Rehabilitation

## 2014-01-29 ENCOUNTER — Ambulatory Visit (HOSPITAL_BASED_OUTPATIENT_CLINIC_OR_DEPARTMENT_OTHER): Payer: 59 | Admitting: Physical Medicine & Rehabilitation

## 2014-01-29 VITALS — BP 165/85 | HR 68 | Resp 14 | Ht 68.0 in | Wt 270.4 lb

## 2014-01-29 DIAGNOSIS — I69920 Aphasia following unspecified cerebrovascular disease: Secondary | ICD-10-CM | POA: Insufficient documentation

## 2014-01-29 DIAGNOSIS — I69919 Unspecified symptoms and signs involving cognitive functions following unspecified cerebrovascular disease: Secondary | ICD-10-CM

## 2014-01-29 DIAGNOSIS — I69320 Aphasia following cerebral infarction: Secondary | ICD-10-CM

## 2014-01-29 DIAGNOSIS — I10 Essential (primary) hypertension: Secondary | ICD-10-CM | POA: Insufficient documentation

## 2014-01-29 DIAGNOSIS — I69119 Unspecified symptoms and signs involving cognitive functions following nontraumatic intracerebral hemorrhage: Secondary | ICD-10-CM

## 2014-01-29 DIAGNOSIS — Z8542 Personal history of malignant neoplasm of other parts of uterus: Secondary | ICD-10-CM | POA: Insufficient documentation

## 2014-01-29 NOTE — Patient Instructions (Signed)
Cont speech therapy No driving No work

## 2014-01-29 NOTE — Progress Notes (Signed)
Subjective:    Patient ID: Melissa [name], female    DOB: [birth year], 63 y.o.   MRN: [id number]  HPI Left thalamic ICH 12/02/2013 Acute care 3/4-3/10 CIR DATE OF ADMISSION: 12/08/2013  DATE OF DISCHARGE: 12/24/2013  Completed outpatient physical therapy yesterday. Is not using an assistive device. Has gone up and down 2 steps at home without difficulty. Work done more steps with therapy. OT completed yesterday. Did some cooking and dish washing tasks. Still not doing these at home because of memory issues Outpatient speech therapy continues for language as well as cognition. Has followed up with neurology and reviewed MRI on 01/28/2014    Pain Inventory Average Pain 3 Pain Right Now 0 My pain is no pain  In the last 24 hours, has pain interfered with the following? General activity 0 Relation with others 0 Enjoyment of life 0 What TIME of day is your pain at its worst? morning Sleep (in general) Good  Pain is worse with: walking Pain improves with: rest Relief from Meds: 8  Mobility walk without assistance how many minutes can you walk? 15 ability to climb steps?  yes do you drive?  no  Function disabled: date disabled 11/2013 I need assistance with the following:  meal prep, household duties and shopping  Neuro/Psych No problems in this area  Prior Studies Any changes since last visit?  no  Physicians involved in your care Any changes since last visit?  no   History reviewed. No pertinent family history. History   Social History  . Marital Status: Married    Spouse Name: N/A    Number of Children: 1  . Years of Education: college   Occupational History  . Retired    Social History Main Topics  . Smoking status: Never Smoker   . Smokeless tobacco: None  . Alcohol Use: No  . Drug Use: No  . Sexual Activity: None   Other Topics Concern  . None   Social History Narrative   Patient lives at home with her husband.   Patient drinks soda and  coffee diet.   Past Surgical History  Procedure Laterality Date  . Replacement total knee bilateral    . Cholecystectomy    . Abdominal hysterectomy     Past Medical History  Diagnosis Date  . Hypertension   . Uterine cancer    BP 165/85  Pulse 68  Resp 14  Ht 5\' 8"  (1.727 m)  Wt 270 lb 6.4 oz (122.653 kg)  BMI 41.12 kg/m2  SpO2 92%  Opioid Risk Score:   Fall Risk Score: Moderate Fall Risk (6-13 points) (educated and handout given for fall prevention in the home) Review of Systems  All other systems reviewed and are negative.      Objective:   Physical Exam  May 1 , 1915 "next to [facility]" (actually next to [facility]) Remembers 2/3 objects after 2 minutes  5/5 strength bilateral deltoid, bicep, tricep, grip, hip flexor, knee extensors, ankle dorsiflexor plantar flexor Sensation intact to pinprick bilateral upper extremities. Extraocular motions intact Visual fields intact confrontation testing Ambulation without toe drag her knee instability Unable to tandem gait Romberg is negative      Assessment & Plan:  1.  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 Unable to go back to work. Do expect some improvement over time but uncertain whether this would be sufficient to allow return for a period no driving at the current time. We'll have patient returned clinic  after she finishes speech therapy in about 2 months time  Completed insurance forms for short term disability Return to clinic 2 months may need to complete long term disability forms at that time Over half of the 25 min visit was spent counseling and coordinating care.

## 2014-02-04 ENCOUNTER — Ambulatory Visit (HOSPITAL_COMMUNITY)
Admission: RE | Admit: 2014-02-04 | Discharge: 2014-02-04 | Disposition: A | Discharge status: Home / Self Care | Payer: 59 | Source: Ambulatory Visit | Attending: Internal Medicine | Admitting: Internal Medicine

## 2014-02-04 DIAGNOSIS — IMO0001 Reserved for inherently not codable concepts without codable children: Secondary | ICD-10-CM | POA: Insufficient documentation

## 2014-02-04 DIAGNOSIS — I69998 Other sequelae following unspecified cerebrovascular disease: Secondary | ICD-10-CM | POA: Insufficient documentation

## 2014-02-04 DIAGNOSIS — I10 Essential (primary) hypertension: Secondary | ICD-10-CM | POA: Insufficient documentation

## 2014-02-04 DIAGNOSIS — R262 Difficulty in walking, not elsewhere classified: Secondary | ICD-10-CM | POA: Insufficient documentation

## 2014-02-04 DIAGNOSIS — I69320 Aphasia following cerebral infarction: Secondary | ICD-10-CM

## 2014-02-04 DIAGNOSIS — M6281 Muscle weakness (generalized): Secondary | ICD-10-CM | POA: Insufficient documentation

## 2014-02-04 NOTE — Progress Notes (Signed)
Speech Language Pathology Treatment Patient Details  Name: Melissa [name] MRN: [id number] Date of Birth: [birth year]  Today's Date: 02/04/2014 Time: 4098-1191 SLP Time Calculation (min): 45 min  Authorization: [employer]  Authorization Time Period: 01/28/2014-02/26/2014  Authorization Visit#:  9 of 16   HPI:  Symptoms/Limitations Symptoms: Pt is agreeable to two more weeks of speech therapy. Pain Assessment Pain Score: 3  Pain Location: Knee   Treatment  Cognitive-Linguistic Therapy Aphasia Therapy Patient/Family Education Home Exercise Program  SLP Goals  Home Exercise SLP Goal: Patient will Perform Home Exercise Program: with supervision, verbal cues required/provided SLP Goal: Perform Home Exercise Program - Progress: Progressing toward goal SLP Short Term Goals SLP Short Term Goal 1: Pt will name 5+ items in abstract category with mi/mod cueing. SLP Short Term Goal 1 - Progress: Progressing toward goal SLP Short Term Goal 2: Pt will implement word finding strategies in structured tasks with 90% acc and mi/mod assist (ie. provide verbal descriptions/function). SLP Short Term Goal 3: Pt will complete moderate level planning and deduction tasks with 90% acc and mod assist SLP Short Term Goal 3 - Progress: Progressing toward goal SLP Short Term Goal 4: Pt will verbally answer open ended questions with 90% acc for content when given mi/mod cues. SLP Short Term Goal 5: Pt will refer to memory book/planner to answer personal/bio questions in regards to circumstance and daily activities. Additional SLP Short Term Goals?: Yes SLP Short Term Goal 6: Pt will develop a daily schedule for home activities with min assist from SLP and husband to increase independence at home. SLP Short Term Goal 6 - Progress: Progressing toward goal SLP Long Term Goals SLP Long Term Goal 1: Increase expressive communication to [facility] for moderately complex information with use of strategies and min  assist. SLP Long Term Goal 1 - Progress: Progressing toward goal SLP Long Term Goal 2: Utilize compensatory strategies for memory and verbal expression to [facility] with min assist. SLP Long Term Goal 2 - Progress: Progressing toward goal SLP Long Term Goal 3: Increase auditory comprehension to [facility] for moderately complext information with use of strategies and indirect cues.  SLP Long Term Goal 3 - Progress: Progressing toward goal  Assessment/Plan  Patient Active Problem List   Diagnosis Date Noted  . Hypertension, benign essential, goal below 140/90 01/28/2014  . Severe obesity (BMI >= 40) 01/28/2014  . Aphasia due to recent cerebrovascular accident 01/07/2014  . 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 ADENOCARCINOMA, ENDOMETRIUM 08/11/2007  . HYPERTHYROIDISM 05/16/2007  . HYPERTENSION 05/16/2007  . OSTEOARTHRITIS 05/16/2007   SLP - End of Session Activity Tolerance: Patient tolerated treatment well General Behavior During Therapy: WFL for tasks assessed/performed  SLP Assessment/Plan Clinical Impression Statement: Mrs. Hamid was accompanied by her husband and she was excited about more independence at home. She no longer has someone staying with her during the day and this seems to have improved her spirits. Homework was completed. In session, she completed a planning/organization task (Thanksgiving dinner seating) with ~80% acc. She benefitted from written cues to help her organize the information. She had increased difficulty following comlex written directions (if... then...) and required mod/max cueing. Some impulsivity noted despite cues to slow down and verbally mediate. She is completing more challenging tasks now and hopefully her frustration will not interfere. Speech Therapy Frequency: min 2x/week Duration: 4 weeks Treatment/Interventions: Language facilitation;Compensatory techniques;Cueing hierarchy;SLP instruction and feedback;Compensatory strategies;Patient/family  education Potential to Achieve Goals: Good Potential Considerations: Ability to learn/carryover information;Severity of  impairments  GN    Ephraim Hamburger 02/04/2014, 11:59 AM

## 2014-02-08 ENCOUNTER — Ambulatory Visit (HOSPITAL_COMMUNITY)
Admission: RE | Admit: 2014-02-08 | Discharge: 2014-02-08 | Disposition: A | Discharge status: Home / Self Care | Payer: 59 | Source: Ambulatory Visit | Attending: Internal Medicine | Admitting: Internal Medicine

## 2014-02-08 DIAGNOSIS — I69320 Aphasia following cerebral infarction: Secondary | ICD-10-CM

## 2014-02-08 NOTE — Progress Notes (Signed)
Speech Language Pathology Treatment Patient Details  Name: Melissa [name] MRN: [id number] Date of Birth: [birth year]  Today's Date: 02/08/2014 Time: 0930-1023 SLP Time Calculation (min): 53 min  Authorization: [employer]  Authorization Time Period: 01/28/2014-02/26/2014  Authorization Visit#:  10 of 16   HPI:  Symptoms/Limitations Symptoms: Melissa [name] says she had a nice weekend.     Treatment  Cognitive-Linguistic Therapy Aphasia Therapy Patient/Family Education Home Exercise Program  SLP Goals  Home Exercise SLP Goal: Patient will Perform Home Exercise Program: with supervision, verbal cues required/provided SLP Short Term Goals SLP Short Term Goal 1: Pt will name 5+ items in abstract category with mi/mod cueing. SLP Short Term Goal 1 - Progress: Progressing toward goal SLP Short Term Goal 2: Pt will implement word finding strategies in structured tasks with 90% acc and mi/mod assist (ie. provide verbal descriptions/function). SLP Short Term Goal 2 - Progress: Progressing toward goal SLP Short Term Goal 3: Pt will complete moderate level planning and deduction tasks with 90% acc and mod assist SLP Short Term Goal 3 - Progress: Progressing toward goal SLP Short Term Goal 4: Pt will verbally answer open ended questions with 90% acc for content when given mi/mod cues. SLP Short Term Goal 4 - Progress: Progressing toward goal SLP Short Term Goal 5: Pt will refer to memory book/planner to answer personal/bio questions in regards to circumstance and daily activities. SLP Short Term Goal 5 - Progress: Progressing toward goal Additional SLP Short Term Goals?: Yes SLP Short Term Goal 6: Pt will develop a daily schedule for home activities with min assist from SLP and husband to increase independence at home. SLP Short Term Goal 6 - Progress: Progressing toward goal SLP Long Term Goals SLP Long Term Goal 1: Increase expressive communication to [facility] for moderately complex  information with use of strategies and min assist. SLP Long Term Goal 1 - Progress: Progressing toward goal SLP Long Term Goal 2: Utilize compensatory strategies for memory and verbal expression to [facility] with min assist. SLP Long Term Goal 2 - Progress: Progressing toward goal SLP Long Term Goal 3: Increase auditory comprehension to [facility] for moderately complext information with use of strategies and indirect cues.  SLP Long Term Goal 3 - Progress: Progressing toward goal  Assessment/Plan  Patient Active Problem List   Diagnosis Date Noted  . Hypertension, benign essential, goal below 140/90 01/28/2014  . Severe obesity (BMI >= 40) 01/28/2014  . Aphasia due to recent cerebrovascular accident 01/07/2014  . 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 ADENOCARCINOMA, ENDOMETRIUM 08/11/2007  . HYPERTHYROIDISM 05/16/2007  . HYPERTENSION 05/16/2007  . OSTEOARTHRITIS 05/16/2007   SLP - End of Session Activity Tolerance: Patient tolerated treatment well General Behavior During Therapy: WFL for tasks assessed/performed  SLP Assessment/Plan Clinical Impression Statement: Melissa [name] was accompanied by her husband today. She struggled with homework (word deduction and deduction puzzles). In session, we worked on the word deduction task where SLP provided three words describing an item and she was asked to identify the item. Pt. repeats the words over and over and needs cues to think about only one word at a time (make association/visualize). If she is given a word that has multiple meanings (ie. "ground"), she has a very difficult time shifting her thoughts to other meanings. When presented with task to provide three descriptive words for a given item (by most salient features), she found this a little easier. When pt perseverates or gets stuck, she still needs reminders to "let  it go" and describe the word. Homework was given (concrete convergent and divergent naming). Continue with POC.  Speech  Therapy Frequency: min 2x/week Duration: 4 weeks Treatment/Interventions: Language facilitation;Compensatory techniques;Cueing hierarchy;SLP instruction and feedback;Compensatory strategies;Patient/family education Potential to Achieve Goals: Good Potential Considerations: Ability to learn/carryover information;Severity of impairments  GN    Ephraim Hamburger 02/08/2014, 10:54 AM

## 2014-02-11 ENCOUNTER — Ambulatory Visit (HOSPITAL_COMMUNITY)
Admission: RE | Admit: 2014-02-11 | Discharge: 2014-02-11 | Disposition: A | Discharge status: Home / Self Care | Payer: 59 | Source: Ambulatory Visit | Attending: Internal Medicine | Admitting: Internal Medicine

## 2014-02-11 DIAGNOSIS — I69320 Aphasia following cerebral infarction: Secondary | ICD-10-CM

## 2014-02-11 NOTE — Progress Notes (Signed)
Speech Language Pathology Treatment Patient Details  Name: Melissa [name] MRN: [id number] Date of Birth: [birth year]  Today's Date: 02/11/2014 Time: 0930-1022 SLP Time Calculation (min): 52 min  Authorization: [employer]  Authorization Time Period: 01/28/2014-02/26/2014  Authorization Visit#:  11 of 16   HPI:  Symptoms/Limitations Symptoms: Doing well.      Treatment  Cognitive-Linguistic Therapy Aphasia Therapy Patient/Family Education Home Exercise Program  SLP Goals  Home Exercise SLP Goal: Patient will Perform Home Exercise Program: with supervision, verbal cues required/provided SLP Goal: Perform Home Exercise Program - Progress: Progressing toward goal SLP Short Term Goals SLP Short Term Goal 1: Pt will name 5+ items in abstract category with mi/mod cueing. SLP Short Term Goal 1 - Progress: Progressing toward goal SLP Short Term Goal 2: Pt will implement word finding strategies in structured tasks with 90% acc and mi/mod assist (ie. provide verbal descriptions/function). SLP Short Term Goal 2 - Progress: Progressing toward goal SLP Short Term Goal 3: Pt will complete moderate level planning and deduction tasks with 90% acc and mod assist SLP Short Term Goal 3 - Progress: Progressing toward goal SLP Short Term Goal 4: Pt will verbally answer open ended questions with 90% acc for content when given mi/mod cues. SLP Short Term Goal 4 - Progress: Progressing toward goal SLP Short Term Goal 5: Pt will refer to memory book/planner to answer personal/bio questions in regards to circumstance and daily activities. SLP Short Term Goal 5 - Progress: Progressing toward goal Additional SLP Short Term Goals?: Yes SLP Short Term Goal 6: Pt will develop a daily schedule for home activities with min assist from SLP and husband to increase independence at home. SLP Short Term Goal 6 - Progress: Progressing toward goal SLP Long Term Goals SLP Long Term Goal 1: Increase  expressive communication to [facility] for moderately complex information with use of strategies and min assist. SLP Long Term Goal 1 - Progress: Progressing toward goal SLP Long Term Goal 2: Utilize compensatory strategies for memory and verbal expression to [facility] with min assist. SLP Long Term Goal 2 - Progress: Progressing toward goal SLP Long Term Goal 3: Increase auditory comprehension to [facility] for moderately complext information with use of strategies and indirect cues.  SLP Long Term Goal 3 - Progress: Progressing toward goal  Assessment/Plan  Patient Active Problem List   Diagnosis Date Noted  . Hypertension, benign essential, goal below 140/90 01/28/2014  . Severe obesity (BMI >= 40) 01/28/2014  . Aphasia due to recent cerebrovascular accident 01/07/2014  . 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 ADENOCARCINOMA, ENDOMETRIUM 08/11/2007  . HYPERTHYROIDISM 05/16/2007  . HYPERTENSION 05/16/2007  . OSTEOARTHRITIS 05/16/2007   SLP - End of Session Activity Tolerance: Patient tolerated treatment well General Behavior During Therapy: WFL for tasks assessed/performed  SLP Assessment/Plan Clinical Impression Statement: Mrs. Basquez was accompanied by her husband today. She reported that homework was easy, however she forgot to add the step of adding one more item to the category. She was able to identify the category, but had a difficult time adding one more to the category. When she gets stuck, she is not using her strategies and depends on cues from therapist or husband. She was encouraged to try visualization and/or association. In word association task, she was able to generate 3 words related to "horse" and needed moderate cues to add more. Basic level deduction puzzles were reviewed and strategies provided for completion. She needs cues to slow down in her reading as she  misses important details or omits important words. Mrs. Fritchman required mod/max cues for completion. She was given more  for homework. Continue POC.  Speech Therapy Frequency: min 2x/week Duration: 2 weeks Treatment/Interventions: Language facilitation;Compensatory techniques;Cueing hierarchy;SLP instruction and feedback;Compensatory strategies;Patient/family education Potential to Achieve Goals: Good Potential Considerations: Ability to learn/carryover information;Severity of impairments  GN    Ephraim Hamburger 02/11/2014, 11:34 AM

## 2014-02-15 ENCOUNTER — Ambulatory Visit (HOSPITAL_COMMUNITY)
Admission: RE | Admit: 2014-02-15 | Discharge: 2014-02-15 | Disposition: A | Discharge status: Home / Self Care | Payer: 59 | Source: Ambulatory Visit | Attending: Internal Medicine | Admitting: Internal Medicine

## 2014-02-15 DIAGNOSIS — I69320 Aphasia following cerebral infarction: Secondary | ICD-10-CM

## 2014-02-15 NOTE — Progress Notes (Signed)
Speech Language Pathology Treatment/Discharge Summary Patient Details  Name: Melissa [name] MRN: [id number] Date of Birth: Nov 13, 1950  Today's Date: 02/15/2014 Time: 0930-1030 SLP Time Calculation (min): 60 min  Authorization: [employer]  Authorization Time Period: 01/28/2014-02/26/2014  Authorization Visit#:  12 of 16   HPI:  Symptoms/Limitations Symptoms: Doing well. Pain Assessment Currently in Pain?: No/denies   Treatment  Cognitive-Linguistic Therapy Aphasia Therapy Patient/Family Education Home Exercise Program  SLP Goals  Home Exercise SLP Goal: Patient will Perform Home Exercise Program: with supervision, verbal cues required/provided SLP Goal: Perform Home Exercise Program - Progress: Met SLP Short Term Goals SLP Short Term Goal 1: Pt will name 5+ items in abstract category with mi/mod cueing. SLP Short Term Goal 1 - Progress: Met SLP Short Term Goal 2: Pt will implement word finding strategies in structured tasks with 90% acc and mi/mod assist (ie. provide verbal descriptions/function). SLP Short Term Goal 2 - Progress: Partly met SLP Short Term Goal 3: Pt will complete moderate level planning and deduction tasks with 90% acc and mod assist SLP Short Term Goal 3 - Progress: Partly met SLP Short Term Goal 4: Pt will verbally answer open ended questions with 90% acc for content when given mi/mod cues. SLP Short Term Goal 4 - Progress: Met SLP Short Term Goal 5: Pt will refer to memory book/planner to answer personal/bio questions in regards to circumstance and daily activities. SLP Short Term Goal 5 - Progress: Met Additional SLP Short Term Goals?: Yes SLP Short Term Goal 6: Pt will develop a daily schedule for home activities with min assist from SLP and husband to increase independence at home. SLP Short Term Goal 6 - Progress: Partly met SLP Long Term Goals SLP Long Term Goal 1: Increase expressive communication to [facility] for moderately complex information  with use of strategies and min assist. SLP Long Term Goal 1 - Progress: Met SLP Long Term Goal 2: Utilize compensatory strategies for memory and verbal expression to [facility] with min assist. SLP Long Term Goal 2 - Progress: Met SLP Long Term Goal 3: Increase auditory comprehension to [facility] for moderately complext information with use of strategies and indirect cues.  SLP Long Term Goal 3 - Progress: Met  Assessment/Plan  Patient Active Problem List   Diagnosis Date Noted  . Hypertension, benign essential, goal below 140/90 01/28/2014  . Severe obesity (BMI >= 40) 01/28/2014  . Aphasia due to recent cerebrovascular accident 01/07/2014  . 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 ADENOCARCINOMA, ENDOMETRIUM 08/11/2007  . HYPERTHYROIDISM 05/16/2007  . HYPERTENSION 05/16/2007  . OSTEOARTHRITIS 05/16/2007   SLP - End of Session Activity Tolerance: Patient tolerated treatment well General Behavior During Therapy: WFL for tasks assessed/performed  SLP Assessment/Plan Clinical Impression Statement: Mrs. Mcafee was accompanied by her husband today for therapy. She reported that she did "great" with homework and that it was "easy", however she needed min assist from her husband and had difficulty with the deductive reasoning puzzle. In session, we worked on a couple of  basic/mod level deductive reasoning/thought organization tasks which she completed with moderate cues. She misses important details when reading and needs reminders to "go slowly". At home, her husband is increasing her independence and he hopes that she will be able to resume handling the bills/finances soon. She stays at home during the day while he works and is hopeful that she will be able to drive again soon. She was encouraged to stick with cognitive linguistic therapy for another couple of  weeks, however she is adamant that she be finished. She was also encouraged to continue with home program, read anything of interest to her,  and complete crossword puzzles etc. I would have concerns about her decreased attention to detail and variable lack of awareness during high-level tasks (driving, bills, medication management) and her husband plans to closely supervise her during these activities. Pt has made tremendous progress and will be discharged from therapy per her wishes.  Potential to Achieve Goals: Good Potential Considerations: Ability to learn/carryover information;Severity of impairments    Thank you,  Genene Churn, CCC-SLP [phone]  Ephraim Hamburger 02/15/2014, 10:58 AM                                                     Physician Treatment Plan  Your signature is required to indicate approval of the treatment plan/progress as stated above. Please make a copy of this report for your files and return this physician signed original in the self-addressed envelope or fax to [phone]. COMMENTS/CHANGES:__________________________________________________________________________________________________________________________   ____________________________________                      ____________________ Carey Bullocks                                                    DATE

## 2014-02-17 ENCOUNTER — Encounter: Payer: Self-pay | Admitting: Internal Medicine

## 2014-02-17 ENCOUNTER — Ambulatory Visit (INDEPENDENT_AMBULATORY_CARE_PROVIDER_SITE_OTHER): Payer: 59 | Admitting: Internal Medicine

## 2014-02-17 VITALS — BP 125/70 | Temp 98.6°F | Wt 262.0 lb

## 2014-02-17 DIAGNOSIS — R7309 Other abnormal glucose: Secondary | ICD-10-CM

## 2014-02-17 DIAGNOSIS — I619 Nontraumatic intracerebral hemorrhage, unspecified: Secondary | ICD-10-CM

## 2014-02-17 DIAGNOSIS — R739 Hyperglycemia, unspecified: Secondary | ICD-10-CM | POA: Insufficient documentation

## 2014-02-17 MED ORDER — AMLODIPINE BESYLATE 5 MG PO TABS: 5.0000 mg | ORAL_TABLET | Freq: Every day | ORAL | Status: DC

## 2014-02-17 MED ORDER — IRBESARTAN 300 MG PO TABS: 300.0000 mg | ORAL_TABLET | Freq: Every day | ORAL | Status: DC

## 2014-02-17 MED ORDER — HYDROCHLOROTHIAZIDE 12.5 MG PO CAPS: 12.5000 mg | ORAL_CAPSULE | Freq: Every day | ORAL | Status: DC

## 2014-02-17 MED ORDER — METOPROLOL TARTRATE 100 MG PO TABS: 50.0000 mg | ORAL_TABLET | Freq: Two times a day (BID) | ORAL | Status: DC

## 2014-02-17 NOTE — Progress Notes (Signed)
Pre visit review using our clinic review tool, if applicable. No additional management support is needed unless otherwise documented below in the visit note. 

## 2014-02-17 NOTE — Progress Notes (Signed)
Stroke- intracranial bleed- Aphasia is improving but not resolved No trouble swallowing  Hyperglycemia- discussed need for weight loss  hyperthyroidism-this is a remote dx. Needs f/u  Has some knee pain  Reviewed past medical history, problems, allergies.  BP 125/70  Temp(Src) 98.6 F (37 C) (Oral)  Wt 262 lb (118.842 kg) Overweight female in no acute distress. Chest clear to auscultation. Cardiac exam S1-S2 are regular. Abdominal exam active bowel sounds, soft. Extremities no edema  Thalamic hemorrhage with stroke She is improving. Clinically she has no muscular deficits. She does still have some expressive aphasia but that's much improved. She is now staying at home alone. Weight loss encouraged. Continue current medications and continue monitoring her blood pressure.

## 2014-02-21 NOTE — Assessment & Plan Note (Signed)
She is improving. Clinically she has no muscular deficits. She does still have some expressive aphasia but that's much improved. She is now staying at home alone. Weight loss encouraged. Continue current medications and continue monitoring her blood pressure.

## 2014-03-17 ENCOUNTER — Telehealth: Payer: Self-pay | Admitting: *Deleted

## 2014-03-17 DIAGNOSIS — Z1211 Encounter for screening for malignant neoplasm of colon: Secondary | ICD-10-CM

## 2014-03-17 NOTE — Telephone Encounter (Signed)
pts husband called and stated that pt is having stomach pain and keeps her up at night.  She is not eating much and pt wants a colonoscopy.  Per Dr Leanne Chang, have pt take prilosec OTC once daily and ok for referral to GI.  Pts husband aware, he would like referral to a GI dr in [address].  Also told husband that if the prilosec didn't work to let me know.  Instructed him to give it to her on an empty stomach and wait 30 mins to eat.  Pt verbalized understanding and had no questions.  Referral order placed

## 2014-03-23 ENCOUNTER — Telehealth: Payer: Self-pay

## 2014-03-23 NOTE — Telephone Encounter (Signed)
I will add patient to the call back list, but they're patients ahead of her wanting to be seen sooner as well. Unless, I can use an URG spot. I will see what I can do.

## 2014-03-23 NOTE — Telephone Encounter (Signed)
Pt was referred by Virgina Jock from [facility] PCP for colonoscopy.  Pt's husband Melissa [name] called and said she had a stroke several months ago and it has affected her speech and it is a little hard to understand her.  She is having some lower abdominal pain and weight loss and pt feels that she has colon cancer.  She is scheduled an OV on 04/26/2014 at 9:30 AM with Neil Crouch, PA and they would like to be called if we have a cancellation before then.  Please call her husband Melissa [name] at work @ [phone] if we have a cancellation.

## 2014-04-02 ENCOUNTER — Encounter (HOSPITAL_COMMUNITY): Payer: Self-pay | Admitting: Emergency Medicine

## 2014-04-02 ENCOUNTER — Emergency Department (HOSPITAL_COMMUNITY)
Admission: EM | Admit: 2014-04-02 | Discharge: 2014-04-02 | Disposition: A | Discharge status: Home / Self Care | Payer: 59 | Attending: Emergency Medicine | Admitting: Emergency Medicine

## 2014-04-02 DIAGNOSIS — N3000 Acute cystitis without hematuria: Secondary | ICD-10-CM | POA: Insufficient documentation

## 2014-04-02 DIAGNOSIS — R319 Hematuria, unspecified: Secondary | ICD-10-CM | POA: Insufficient documentation

## 2014-04-02 DIAGNOSIS — Z792 Long term (current) use of antibiotics: Secondary | ICD-10-CM | POA: Insufficient documentation

## 2014-04-02 DIAGNOSIS — Z90710 Acquired absence of both cervix and uterus: Secondary | ICD-10-CM | POA: Insufficient documentation

## 2014-04-02 DIAGNOSIS — N3001 Acute cystitis with hematuria: Secondary | ICD-10-CM

## 2014-04-02 DIAGNOSIS — I10 Essential (primary) hypertension: Secondary | ICD-10-CM | POA: Insufficient documentation

## 2014-04-02 DIAGNOSIS — Z79899 Other long term (current) drug therapy: Secondary | ICD-10-CM | POA: Insufficient documentation

## 2014-04-02 DIAGNOSIS — Z8542 Personal history of malignant neoplasm of other parts of uterus: Secondary | ICD-10-CM | POA: Insufficient documentation

## 2014-04-02 LAB — URINALYSIS, ROUTINE W REFLEX MICROSCOPIC
Bilirubin Urine: NEGATIVE
Glucose, UA: NEGATIVE mg/dL
Ketones, ur: NEGATIVE mg/dL
NITRITE: NEGATIVE
Protein, ur: 100 mg/dL — AB
SPECIFIC GRAVITY, URINE: 1.01 (ref 1.005–1.030)
Urobilinogen, UA: 0.2 mg/dL (ref 0.0–1.0)
pH: 6 (ref 5.0–8.0)

## 2014-04-02 LAB — URINE MICROSCOPIC-ADD ON

## 2014-04-02 MED ORDER — CEPHALEXIN 500 MG PO CAPS
500.0000 mg | ORAL_CAPSULE | Freq: Once | ORAL | Status: AC
  Administered 2014-04-02: 500 mg via ORAL
  Filled 2014-04-02: qty 1

## 2014-04-02 MED ORDER — CEPHALEXIN 500 MG PO CAPS: 500.0000 mg | ORAL_CAPSULE | Freq: Three times a day (TID) | ORAL | Status: DC

## 2014-04-02 MED ORDER — PHENAZOPYRIDINE HCL 100 MG PO TABS
200.0000 mg | ORAL_TABLET | Freq: Once | ORAL | Status: AC
  Administered 2014-04-02: 200 mg via ORAL
  Filled 2014-04-02: qty 2

## 2014-04-02 MED ORDER — PHENAZOPYRIDINE HCL 200 MG PO TABS: 200.0000 mg | ORAL_TABLET | Freq: Three times a day (TID) | ORAL | Status: DC

## 2014-04-02 NOTE — ED Provider Notes (Signed)
CSN: [id number]     Arrival date & time 04/02/14  0114 History   First MD Initiated Contact with Patient 04/02/14 0124     Chief Complaint  Patient presents with  . Dysuria     HPI Patient's had mild lower abdominal pain over the past 2-3 days that developed into worsening pain this evening as well as urinary frequency.  She denies dysuria.  No fevers or chills.  No nausea or vomiting.  No flank pain.  No alteration in mental status.  Symptoms are mild to moderate in severity.  No history of urinary tract infections before.   Past Medical History  Diagnosis Date  . Hypertension   . Uterine cancer    Past Surgical History  Procedure Laterality Date  . Replacement total knee bilateral    . Cholecystectomy    . Abdominal hysterectomy     No family history on file. History  Substance Use Topics  . Smoking status: Never Smoker   . Smokeless tobacco: Not on file  . Alcohol Use: No   OB History   Grav Para Term Preterm Abortions TAB SAB Ect Mult Living                 Review of Systems  All other systems reviewed and are negative.     Allergies  Review of patient's allergies indicates no known allergies.  Home Medications   Prior to Admission medications   Medication Sig Start Date End Date Taking? Authorizing Provider  acetaminophen (TYLENOL) 500 MG tablet Take 500 mg by mouth every 6 (six) hours as needed.   Yes Historical Provider, MD  amLODipine (NORVASC) 5 MG tablet Take 1 tablet (5 mg total) by mouth daily. 02/17/14  Yes Lisabeth Pick, MD  hydrALAZINE (APRESOLINE) 10 MG tablet Take 10 mg by mouth 3 (three) times daily.   Yes Historical Provider, MD  hydrochlorothiazide (MICROZIDE) 12.5 MG capsule Take 1 capsule (12.5 mg total) by mouth daily. 02/17/14  Yes Bruce Kendall Flack, MD  irbesartan (AVAPRO) 300 MG tablet Take 1 tablet (300 mg total) by mouth daily. 02/17/14  Yes Lisabeth Pick, MD  metoprolol (LOPRESSOR) 100 MG tablet Take 0.5 tablets (50 mg total) by mouth 2  (two) times daily. 02/17/14  Yes Bruce Kendall Flack, MD  cephALEXin (KEFLEX) 500 MG capsule Take 1 capsule (500 mg total) by mouth 3 (three) times daily. 04/02/14   Hoy Morn, MD  phenazopyridine (PYRIDIUM) 200 MG tablet Take 1 tablet (200 mg total) by mouth 3 (three) times daily. 04/02/14   Hoy Morn, MD   BP 161/68  Pulse 78  Temp(Src) 97.9 F (36.6 C)  Resp 20  SpO2 100% Physical Exam  Nursing note and vitals reviewed. Constitutional: She is oriented to person, place, and time. She appears well-developed and well-nourished. No distress.  HENT:  Head: Normocephalic and atraumatic.  Eyes: EOM are normal.  Neck: Normal range of motion.  Cardiovascular: Normal rate and regular rhythm.   Pulmonary/Chest: Effort normal and breath sounds normal.  Abdominal: Soft. She exhibits no distension.  Mild suprapubic tenderness  Musculoskeletal: Normal range of motion.  Neurological: She is alert and oriented to person, place, and time.  Skin: Skin is warm and dry.  Psychiatric: She has a normal mood and affect. Judgment normal.    ED Course  Procedures (including critical care time) Labs Review Labs Reviewed  URINALYSIS, ROUTINE W REFLEX MICROSCOPIC - Abnormal; Notable for the following:    Color, Urine  RED (*)    APPearance HAZY (*)    Hgb urine dipstick LARGE (*)    Protein, ur 100 (*)    Leukocytes, UA MODERATE (*)    All other components within normal limits  URINE MICROSCOPIC-ADD ON - Abnormal; Notable for the following:    Squamous Epithelial / LPF MANY (*)    Bacteria, UA MANY (*)    All other components within normal limits  URINE CULTURE    Imaging Review No results found.   EKG Interpretation None      MDM   Final diagnoses:  Acute cystitis with hematuria    Urine culture.  Treat for urinary tract infection.  Discharge home in good condition.  Bowels normal.  Overall well-appearing.     Hoy Morn, MD 04/02/14 [phone]

## 2014-04-02 NOTE — Discharge Instructions (Signed)

## 2014-04-02 NOTE — ED Notes (Signed)
Pt states she is having burning with urination and lower abd pain for several hours.

## 2014-04-02 NOTE — ED Notes (Signed)
MD at bedside. 

## 2014-04-03 LAB — URINE CULTURE: Colony Count: 45000

## 2014-04-08 ENCOUNTER — Telehealth: Payer: Self-pay | Admitting: *Deleted

## 2014-04-08 ENCOUNTER — Other Ambulatory Visit: Payer: Self-pay | Admitting: Internal Medicine

## 2014-04-08 ENCOUNTER — Ambulatory Visit (HOSPITAL_COMMUNITY)
Admission: RE | Admit: 2014-04-08 | Discharge: 2014-04-08 | Disposition: A | Discharge status: Home / Self Care | Payer: 59 | Source: Ambulatory Visit | Attending: Internal Medicine | Admitting: Internal Medicine

## 2014-04-08 DIAGNOSIS — R5383 Other fatigue: Secondary | ICD-10-CM

## 2014-04-08 DIAGNOSIS — M479 Spondylosis, unspecified: Secondary | ICD-10-CM | POA: Insufficient documentation

## 2014-04-08 DIAGNOSIS — R5381 Other malaise: Secondary | ICD-10-CM

## 2014-04-08 DIAGNOSIS — R1084 Generalized abdominal pain: Secondary | ICD-10-CM | POA: Insufficient documentation

## 2014-04-08 DIAGNOSIS — Z8542 Personal history of malignant neoplasm of other parts of uterus: Secondary | ICD-10-CM

## 2014-04-08 DIAGNOSIS — R109 Unspecified abdominal pain: Secondary | ICD-10-CM

## 2014-04-08 DIAGNOSIS — R110 Nausea: Secondary | ICD-10-CM | POA: Insufficient documentation

## 2014-04-08 DIAGNOSIS — I10 Essential (primary) hypertension: Secondary | ICD-10-CM

## 2014-04-08 LAB — POCT I-STAT CREATININE: CREATININE: 1 mg/dL (ref 0.50–1.10)

## 2014-04-08 MED ORDER — IOHEXOL 300 MG/ML  SOLN
100.0000 mL | Freq: Once | INTRAMUSCULAR | Status: AC | PRN
  Administered 2014-04-08: 100 mL via INTRAVENOUS

## 2014-04-08 MED ORDER — SODIUM CHLORIDE 0.9 % IJ SOLN
INTRAMUSCULAR | Status: AC
  Filled 2014-04-08: qty 500

## 2014-04-08 NOTE — Telephone Encounter (Signed)
Pt called complaining of lower abd pain, nausea, no vomiting.  It has been "going on for awhile".  She went to the ED and was dx with a UTI and she is going to finish her last dose of keflex today.  She has hx of uterine cancer.  Pt is also requesting to get her thyroid checked today.  Called Dr Leanne Chang on his cell phone and per Dr Leanne Chang get a CT of Abd and pelvis with contrast, BMET, and ok to check thyroid.  Order placed.  Pt aware we will call with appt time

## 2014-04-09 LAB — T3, FREE: T3 FREE: 2.8 pg/mL (ref 2.3–4.2)

## 2014-04-09 LAB — TSH: TSH: 1.417 u[IU]/mL (ref 0.350–4.500)

## 2014-04-09 LAB — BASIC METABOLIC PANEL
BUN: 12 mg/dL (ref 6–23)
CHLORIDE: 93 meq/L — AB (ref 96–112)
CO2: 27 mEq/L (ref 19–32)
Calcium: 9.9 mg/dL (ref 8.4–10.5)
Creat: 0.96 mg/dL (ref 0.50–1.10)
Glucose, Bld: 109 mg/dL — ABNORMAL HIGH (ref 70–99)
POTASSIUM: 3.5 meq/L (ref 3.5–5.3)
SODIUM: 132 meq/L — AB (ref 135–145)

## 2014-04-09 LAB — T4, FREE: FREE T4: 1.53 ng/dL (ref 0.80–1.80)

## 2014-04-12 ENCOUNTER — Ambulatory Visit: Payer: 59 | Admitting: Physical Medicine & Rehabilitation

## 2014-04-12 ENCOUNTER — Ambulatory Visit (INDEPENDENT_AMBULATORY_CARE_PROVIDER_SITE_OTHER): Payer: 59 | Admitting: Internal Medicine

## 2014-04-12 ENCOUNTER — Encounter: Payer: Self-pay | Admitting: Internal Medicine

## 2014-04-12 VITALS — BP 130/74 | HR 64 | Temp 98.2°F | Ht 68.0 in | Wt 242.0 lb

## 2014-04-12 DIAGNOSIS — R739 Hyperglycemia, unspecified: Secondary | ICD-10-CM

## 2014-04-12 DIAGNOSIS — E0590 Thyrotoxicosis, unspecified without thyrotoxic crisis or storm: Secondary | ICD-10-CM

## 2014-04-12 DIAGNOSIS — I10 Essential (primary) hypertension: Secondary | ICD-10-CM

## 2014-04-12 DIAGNOSIS — R7309 Other abnormal glucose: Secondary | ICD-10-CM

## 2014-04-12 DIAGNOSIS — E785 Hyperlipidemia, unspecified: Secondary | ICD-10-CM

## 2014-04-12 DIAGNOSIS — I619 Nontraumatic intracerebral hemorrhage, unspecified: Secondary | ICD-10-CM

## 2014-04-12 LAB — LIPID PANEL
CHOL/HDL RATIO: 4
Cholesterol: 126 mg/dL (ref 0–200)
HDL: 35 mg/dL — AB (ref >39.00)
LDL Cholesterol: 67 mg/dL (ref 0–99)
NonHDL: 91
TRIGLYCERIDES: 121 mg/dL (ref 0.0–149.0)
VLDL: 24.2 mg/dL (ref 0.0–40.0)

## 2014-04-12 LAB — CBC WITH DIFFERENTIAL/PLATELET
BASOS ABS: 0.4 10*3/uL — AB (ref 0.0–0.1)
Basophils Relative: 4.5 % — ABNORMAL HIGH (ref 0.0–3.0)
EOS ABS: 0.2 10*3/uL (ref 0.0–0.7)
Eosinophils Relative: 2.2 % (ref 0.0–5.0)
HEMATOCRIT: 40 % (ref 36.0–46.0)
Hemoglobin: 13.4 g/dL (ref 12.0–15.0)
LYMPHS ABS: 0.4 10*3/uL — AB (ref 0.7–4.0)
Lymphocytes Relative: 4.3 % — ABNORMAL LOW (ref 12.0–46.0)
MCHC: 33.3 g/dL (ref 30.0–36.0)
MCV: 93.3 fl (ref 78.0–100.0)
Monocytes Absolute: 0.6 10*3/uL (ref 0.1–1.0)
Monocytes Relative: 6.1 % (ref 3.0–12.0)
NEUTROS ABS: 7.7 10*3/uL (ref 1.4–7.7)
Neutrophils Relative %: 82.9 % — ABNORMAL HIGH (ref 43.0–77.0)
PLATELETS: 216 10*3/uL (ref 150.0–400.0)
RBC: 4.29 Mil/uL (ref 3.87–5.11)
RDW: 12.9 % (ref 11.5–15.5)
WBC: 9.2 10*3/uL (ref 4.0–10.5)

## 2014-04-12 LAB — BASIC METABOLIC PANEL
BUN: 19 mg/dL (ref 6–23)
CO2: 27 mEq/L (ref 19–32)
CREATININE: 1.1 mg/dL (ref 0.4–1.2)
Calcium: 9.4 mg/dL (ref 8.4–10.5)
Chloride: 99 mEq/L (ref 96–112)
GFR: 55.49 mL/min — AB (ref >60.00)
Glucose, Bld: 118 mg/dL — ABNORMAL HIGH (ref 70–99)
Potassium: 3.1 mEq/L — ABNORMAL LOW (ref 3.5–5.1)
SODIUM: 136 meq/L (ref 135–145)

## 2014-04-12 LAB — HEPATIC FUNCTION PANEL
ALK PHOS: 103 U/L (ref 39–117)
ALT: 21 U/L (ref 0–35)
AST: 17 U/L (ref 0–37)
Albumin: 3.9 g/dL (ref 3.5–5.2)
BILIRUBIN DIRECT: 0.1 mg/dL (ref 0.0–0.3)
Total Bilirubin: 1.1 mg/dL (ref 0.2–1.2)
Total Protein: 7.3 g/dL (ref 6.0–8.3)

## 2014-04-12 LAB — HEMOGLOBIN A1C: HEMOGLOBIN A1C: 5.5 % (ref 4.6–6.5)

## 2014-04-12 NOTE — Progress Notes (Signed)
Abdominal pain- has now resolved- she still needs colonoscopy.  Reviewed CT  Mammogram- done per pt's report  htn- home bps 110-135/70  Weight- she has lost weight  Stroke- she has aphasia but it is much improved. 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 HEENT exam atraumatic, normocephalic, extraocular muscles are intact. Neck is supple. No jugular venous distention no thyromegaly. Chest clear to auscultation without increased work of breathing. Cardiac exam S1 and S2 are regular. Abdominal exam active bowel sounds, soft, nontender. Extremities no edema. Neurologic exam she is alert without any motor sensory deficits. Gait is normal.

## 2014-04-12 NOTE — Progress Notes (Signed)
Pre visit review using our clinic review tool, if applicable. No additional management support is needed unless otherwise documented below in the visit note. 

## 2014-04-15 NOTE — Assessment & Plan Note (Signed)
She is improving i'm concerned that her deficits will not resove to a point where she will be able to return ( i highly doubt)  Check labs today-- check lipids

## 2014-04-20 ENCOUNTER — Other Ambulatory Visit: Payer: Self-pay | Admitting: *Deleted

## 2014-04-20 MED ORDER — IRBESARTAN 300 MG PO TABS: 300.0000 mg | ORAL_TABLET | Freq: Every day | ORAL | Status: DC

## 2014-04-26 ENCOUNTER — Other Ambulatory Visit: Payer: Self-pay | Admitting: Internal Medicine

## 2014-04-26 ENCOUNTER — Ambulatory Visit (INDEPENDENT_AMBULATORY_CARE_PROVIDER_SITE_OTHER): Payer: 59 | Admitting: Gastroenterology

## 2014-04-26 ENCOUNTER — Encounter: Payer: Self-pay | Admitting: Gastroenterology

## 2014-04-26 VITALS — BP 165/77 | HR 60 | Temp 98.1°F | Ht 68.0 in | Wt 241.8 lb

## 2014-04-26 DIAGNOSIS — R109 Unspecified abdominal pain: Secondary | ICD-10-CM | POA: Insufficient documentation

## 2014-04-26 DIAGNOSIS — Z1211 Encounter for screening for malignant neoplasm of colon: Secondary | ICD-10-CM

## 2014-04-26 MED ORDER — PEG 3350-KCL-NA BICARB-NACL 420 G PO SOLR: 4000.0000 mL | ORAL | Status: DC

## 2014-04-26 NOTE — Patient Instructions (Signed)
1. Colonoscopy as scheduled. Please see separate instructions. 

## 2014-04-26 NOTE — Progress Notes (Signed)
Primary Care Physician:  Chancy Hurter, MD  Primary Gastroenterologist:  Garfield Cornea, MD   Chief Complaint  Patient presents with  . Colonoscopy    had abdominal pain previously but it has subsided    HPI:  Melissa [name] is a 63 y.o. female here to schedule first ever colonoscopy. Previously asked come into the office because she was having abdominal pain. Also with significant weight loss of 50+ pounds since March, initially started at time of stroke. Patient admits to intentional weight loss after that. Husband states that she was "starving herself". 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 Much improved however. She reports that she was happy about losing weight and continue to eat extremely low calorie diet for weight loss purposes. Now she is eating 1200 calories a day. Continues to loose about 1 pound per week. Her abdominal pain was evaluated with CT scan as outlined below. No significant findings except for back issues.No further abdominal pain. No n/v, dysphagia, gerd. BM chronically loose, once daily. New brbpr, melena.    Current Outpatient Prescriptions  Medication Sig Dispense Refill  . acetaminophen (TYLENOL) 500 MG tablet Take 500 mg by mouth every 6 (six) hours as needed.      Marland Kitchen amLODipine (NORVASC) 5 MG tablet Take 5 mg by mouth 2 (two) times daily.      . hydrALAZINE (APRESOLINE) 10 MG tablet Take 10 mg by mouth 3 (three) times daily as needed.       . hydrochlorothiazide (MICROZIDE) 12.5 MG capsule Take 1 capsule (12.5 mg total) by mouth daily.  90 capsule  3  . irbesartan (AVAPRO) 300 MG tablet Take 1 tablet (300 mg total) by mouth daily.  90 tablet  3  . metoprolol (LOPRESSOR) 100 MG tablet Take 100 mg by mouth 2 (two) times daily.       No current facility-administered medications for this visit.    Allergies as of 04/26/2014  . (No Known Allergies)    Past Medical History   Diagnosis Date  . Hypertension   . Uterine cancer   . Hyperthyroidism     remote  . 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 Replacement total knee bilateral    . Cholecystectomy    . Abdominal hysterectomy      Family History  Problem Relation Age of Onset  . Colon cancer Neg Hx     History   Social History  . Marital Status: Married    Spouse Name: N/A    Number of Children: 1  . Years of Education: college   Occupational History  . Retired    Social History Main Topics  . Smoking status: Never Smoker   . Smokeless tobacco: Not on file     Comment: Never smoked  . Alcohol Use: No  . Drug Use: No  . Sexual Activity: Not on file   Other Topics Concern  . Not on file   Social History Narrative   Patient lives at home with her husband.   Patient drinks soda and coffee diet.      ROS:  General: Negative for anorexia, weight loss, fever, chills, fatigue, weakness. Eyes: Negative for vision changes.  ENT: Negative for hoarseness, difficulty swallowing , nasal congestion. CV: Negative for chest pain, angina, palpitations, dyspnea on exertion, peripheral edema.  Respiratory: Negative for dyspnea at rest, dyspnea on exertion, cough, sputum, wheezing.  GI: See history of present illness. GU:  Negative for dysuria, hematuria, urinary incontinence, urinary frequency, nocturnal urination.  MS: Negative for joint pain, low back pain.  Derm: Negative for rash or itching.  Neuro: Negative for weakness, abnormal sensation, seizure, frequent headaches, memory loss, confusion.  Psych: Negative for anxiety, depression, suicidal ideation, hallucinations.  Endo: Negative for unusual weight change.  Heme: Negative for bruising or bleeding. Allergy: Negative for rash or hives.    Physical Examination:  BP 165/77  Pulse 60  Temp(Src) 98.1 F (36.7 C)  Ht 5\' 8"  (1.727 m)  Wt 241 lb 12.8 oz (109.68 kg)  BMI  36.77 kg/m2   General: Well-nourished, well-developed in no acute distress.  Head: Normocephalic, atraumatic.   Eyes: Conjunctiva pink, no icterus. Mouth: Oropharyngeal mucosa moist and pink , no lesions erythema or exudate. Neck: Supple without thyromegaly, masses, or lymphadenopathy.  Lungs: Clear to auscultation bilaterally.  Heart: Regular rate and rhythm, no murmurs rubs or gallops.  Abdomen: Bowel sounds are normal, nontender, nondistended, no hepatosplenomegaly or masses, no abdominal bruits or    hernia , no rebound or guarding.   Rectal: Deferred Extremities: No lower extremity edema. No clubbing or deformities.  Neuro: Alert and oriented x 4 , grossly normal neurologically.  Skin: Warm and dry, no rash or jaundice.   Psych: Alert and cooperative, normal mood and affect.  Labs: Lab Results  Component Value Date   ALT 21 04/12/2014   AST 17 04/12/2014   ALKPHOS 103 04/12/2014   BILITOT 1.1 04/12/2014   Lab Results  Component Value Date   CREATININE 1.1 04/12/2014   BUN 19 04/12/2014   NA 136 04/12/2014   K 3.1* 04/12/2014   CL 99 04/12/2014   CO2 27 04/12/2014   Lab Results  Component Value Date   WBC 9.2 04/12/2014   HGB 13.4 04/12/2014   HCT 40.0 04/12/2014   MCV 93.3 04/12/2014   PLT 216.0 04/12/2014   Lab Results  Component Value Date   TSH 1.417 04/08/2014     Imaging Studies: Ct Abdomen Pelvis W Contrast  04/08/2014   CLINICAL DATA:  Generalized abdominal pain and nausea. History of uterine carcinoma.  EXAM: CT ABDOMEN AND PELVIS WITH CONTRAST  TECHNIQUE: Multidetector CT imaging of the abdomen and pelvis was performed using the standard protocol following bolus administration of intravenous contrast.  CONTRAST:  124mL OMNIPAQUE IOHEXOL 300 MG/ML  SOLN  COMPARISON:  08/18/2007  FINDINGS: Clear lung bases.  Heart is normal in size.  Liver and spleen are unremarkable. Gallbladder surgically absent. Common bile duct is prominent, but shows normal distal tapering. No  intrahepatic bile duct dilation. Normal pancreas. No adrenal masses. Bilateral renal cortical lobulation, stable. No renal masses, stones or hydronephrosis. Normal ureters. Normal bladder.  Uterus is surgically absent.  No pelvic masses.  No adenopathy.  No abnormal fluid collections.  Colon and small bowel are unremarkable. Normal appendix is visualized.  There are significant degenerative changes throughout the visualized spine most prominent at L2-L3 where there is endplate irregularity and sclerosis, which has advanced since the prior study.  IMPRESSION: 1. No acute findings. No findings to explain generalized abdominal pain. 2. Status post cholecystectomy and hysterectomy. 3. Significant degenerative changes throughout the visualized spine which have advanced from the prior CT.   Electronically Signed   By: Lajean Manes M.D.   On: 04/08/2014 18:07

## 2014-04-26 NOTE — Assessment & Plan Note (Addendum)
Resolved with improved dietary intake. CT findings reassuring. Recommend colonoscopy now for colon cancer screening purposes. Given history of labile hypertension, she will take her medications the morning of the procedure.  I have discussed the risks, alternatives, benefits with regards to but not limited to the risk of reaction to medication, bleeding, infection, perforation and the patient is agreeable to proceed. Written consent to be obtained.

## 2014-04-30 ENCOUNTER — Encounter (HOSPITAL_COMMUNITY): Payer: Self-pay | Admitting: Pharmacy Technician

## 2014-05-10 ENCOUNTER — Ambulatory Visit: Payer: 59 | Admitting: Neurology

## 2014-05-12 ENCOUNTER — Encounter (HOSPITAL_COMMUNITY)
Admission: RE | Disposition: A | Discharge status: Home / Self Care | Payer: Self-pay | Source: Ambulatory Visit | Attending: Internal Medicine

## 2014-05-12 ENCOUNTER — Encounter (HOSPITAL_COMMUNITY): Payer: Self-pay | Admitting: *Deleted

## 2014-05-12 ENCOUNTER — Ambulatory Visit (HOSPITAL_COMMUNITY)
Admission: RE | Admit: 2014-05-12 | Discharge: 2014-05-12 | Disposition: A | Discharge status: Home / Self Care | Payer: 59 | Source: Ambulatory Visit | Attending: Internal Medicine | Admitting: Internal Medicine

## 2014-05-12 DIAGNOSIS — D126 Benign neoplasm of colon, unspecified: Secondary | ICD-10-CM

## 2014-05-12 DIAGNOSIS — Z1211 Encounter for screening for malignant neoplasm of colon: Secondary | ICD-10-CM | POA: Insufficient documentation

## 2014-05-12 DIAGNOSIS — Z86010 Personal history of colonic polyps: Secondary | ICD-10-CM

## 2014-05-12 HISTORY — PX: COLONOSCOPY: SHX5424

## 2014-05-12 SURGERY — COLONOSCOPY
Anesthesia: Moderate Sedation

## 2014-05-12 MED ORDER — SODIUM CHLORIDE 0.9 % IV SOLN
INTRAVENOUS | Status: DC
  Administered 2014-05-12: 11:00:00 via INTRAVENOUS

## 2014-05-12 MED ORDER — ONDANSETRON HCL 4 MG/2ML IJ SOLN
INTRAMUSCULAR | Status: DC | PRN
  Administered 2014-05-12: 4 mg via INTRAVENOUS

## 2014-05-12 MED ORDER — ONDANSETRON HCL 4 MG/2ML IJ SOLN
INTRAMUSCULAR | Status: AC
  Filled 2014-05-12: qty 2

## 2014-05-12 MED ORDER — MEPERIDINE HCL 100 MG/ML IJ SOLN
INTRAMUSCULAR | Status: DC | PRN
  Administered 2014-05-12 (×2): 25 mg via INTRAVENOUS

## 2014-05-12 MED ORDER — MIDAZOLAM HCL 5 MG/5ML IJ SOLN
INTRAMUSCULAR | Status: DC | PRN
  Administered 2014-05-12: 0.5 mg via INTRAVENOUS
  Administered 2014-05-12 (×2): 2 mg via INTRAVENOUS

## 2014-05-12 MED ORDER — SIMETHICONE 40 MG/0.6ML PO SUSP
ORAL | Status: DC | PRN
Start: 2014-05-12 — End: 2014-05-12
  Administered 2014-05-12: 12:00:00

## 2014-05-12 MED ORDER — MEPERIDINE HCL 100 MG/ML IJ SOLN
INTRAMUSCULAR | Status: AC
  Filled 2014-05-12: qty 2

## 2014-05-12 MED ORDER — MIDAZOLAM HCL 5 MG/5ML IJ SOLN
INTRAMUSCULAR | Status: AC
  Filled 2014-05-12: qty 10

## 2014-05-12 NOTE — Op Note (Addendum)
[facility] [address] [place], [address]   COLONOSCOPY PROCEDURE REPORT  PATIENT: Melissa [name], Melissa [name]  MR#:         [id number] BIRTHDATE: [birth year] , 62  yrs. old GENDER: Female ENDOSCOPIST: R.  Garfield Cornea, MD FACP FACG REFERRED BY:  Phoebe Sharps, M.D. PROCEDURE DATE:  05/12/2014 PROCEDURE:     Colonoscopy with multiple snare polypectomies and biopsy  INDICATIONS:  First-ever average risk colorectal cancer screening examination  INFORMED CONSENT:  The risks, benefits, alternatives and imponderables including but not limited to bleeding, perforation as well as the possibility of a missed lesion have been reviewed.  The potential for biopsy, lesion removal, etc. have also been discussed.  Questions have been answered.  All parties agreeable. Please see the history and physical in the medical record for more information.  MEDICATIONS: Versed 4.5 mg IV and Demerol 50 mg IV in divided doses. Zofran 4 mg IV.  DESCRIPTION OF PROCEDURE:  After a digital rectal exam was performed, the EC-[id number] ([id number])  colonoscope was advanced from the anus through the rectum and colon to the area of the cecum, ileocecal valve and appendiceal orifice.  The cecum was deeply intubated.  These structures were well-seen and photographed for the record.  From the level of the cecum and ileocecal valve, the scope was slowly and cautiously withdrawn.  The mucosal surfaces were carefully surveyed utilizing scope tip deflection to facilitate fold flattening as needed.  The scope was pulled down into the rectum where a thorough examination including retroflexion was performed.    FINDINGS:  Adequate preparation. Normal rectum. (1) 5 mm polyp in the sigmoid segment There were multiple polyps in the descending, sigmoid, transverse and ascending segments. The largest polyp was approximately 1 cm in dimensions in the proximal transverse segment. Specifically, (2) in the ascending  segment, (2) in the proximal transverse segment, (2) in the descending segment.and (1) in the sigmoid segment.  THERAPEUTIC / DIAGNOSTIC MANEUVERS PERFORMED:  Multiple hot and cold snare polypectomies as well as cold biopsy removal of polyps performed from the sigmoid segment through the ascending segment. All polyps seen were removed.  COMPLICATIONS: none  CECAL WITHDRAWAL TIME:  22 minutes  IMPRESSION:  Multiple colonic polyps-removed as described above  RECOMMENDATIONS: Followup on pathology. Of note, patient was noted to have a tendency to desaturate intermittently with fairly conservative doses of Versed and Demerol. It was of no clinical consequence during the procedure. Patient notes that she snores frequently and is awakened by her husband on a regular basis at home. No prior sleep study.  I recommend the patient see Dr. Leanne Chang for consideration of a sleep study as he deems appropriate..   _______________________________ eSigned:  R. Garfield Cornea, MD FACP [facility] 05/12/2014 12:50 PM   CC:    PATIENT NAME:  Aliesha, Dolata MR#: [id number]

## 2014-05-12 NOTE — H&P (View-Only) (Signed)
Primary Care Physician:  Chancy Hurter, MD  Primary Gastroenterologist:  Garfield Cornea, MD   Chief Complaint  Patient presents with  . Colonoscopy    had abdominal pain previously but it has subsided    HPI:  Melissa [name] is a 63 y.o. female here to schedule first ever colonoscopy. Previously asked come into the office because she was having abdominal pain. Also with significant weight loss of 50+ pounds since March, initially started at time of stroke. Patient admits to intentional weight loss after that. Husband states that she was "starving herself". 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 Much improved however. She reports that she was happy about losing weight and continue to eat extremely low calorie diet for weight loss purposes. Now she is eating 1200 calories a day. Continues to loose about 1 pound per week. Her abdominal pain was evaluated with CT scan as outlined below. No significant findings except for back issues.No further abdominal pain. No n/v, dysphagia, gerd. BM chronically loose, once daily. New brbpr, melena.    Current Outpatient Prescriptions  Medication Sig Dispense Refill  . acetaminophen (TYLENOL) 500 MG tablet Take 500 mg by mouth every 6 (six) hours as needed.      Marland Kitchen amLODipine (NORVASC) 5 MG tablet Take 5 mg by mouth 2 (two) times daily.      . hydrALAZINE (APRESOLINE) 10 MG tablet Take 10 mg by mouth 3 (three) times daily as needed.       . hydrochlorothiazide (MICROZIDE) 12.5 MG capsule Take 1 capsule (12.5 mg total) by mouth daily.  90 capsule  3  . irbesartan (AVAPRO) 300 MG tablet Take 1 tablet (300 mg total) by mouth daily.  90 tablet  3  . metoprolol (LOPRESSOR) 100 MG tablet Take 100 mg by mouth 2 (two) times daily.       No current facility-administered medications for this visit.    Allergies as of 04/26/2014  . (No Known Allergies)    Past Medical History   Diagnosis Date  . Hypertension   . Uterine cancer   . Hyperthyroidism     remote  . 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 Replacement total knee bilateral    . Cholecystectomy    . Abdominal hysterectomy      Family History  Problem Relation Age of Onset  . Colon cancer Neg Hx     History   Social History  . Marital Status: Married    Spouse Name: N/A    Number of Children: 1  . Years of Education: college   Occupational History  . Retired    Social History Main Topics  . Smoking status: Never Smoker   . Smokeless tobacco: Not on file     Comment: Never smoked  . Alcohol Use: No  . Drug Use: No  . Sexual Activity: Not on file   Other Topics Concern  . Not on file   Social History Narrative   Patient lives at home with her husband.   Patient drinks soda and coffee diet.      ROS:  General: Negative for anorexia, weight loss, fever, chills, fatigue, weakness. Eyes: Negative for vision changes.  ENT: Negative for hoarseness, difficulty swallowing , nasal congestion. CV: Negative for chest pain, angina, palpitations, dyspnea on exertion, peripheral edema.  Respiratory: Negative for dyspnea at rest, dyspnea on exertion, cough, sputum, wheezing.  GI: See history of present illness. GU:  Negative for dysuria, hematuria, urinary incontinence, urinary frequency, nocturnal urination.  MS: Negative for joint pain, low back pain.  Derm: Negative for rash or itching.  Neuro: Negative for weakness, abnormal sensation, seizure, frequent headaches, memory loss, confusion.  Psych: Negative for anxiety, depression, suicidal ideation, hallucinations.  Endo: Negative for unusual weight change.  Heme: Negative for bruising or bleeding. Allergy: Negative for rash or hives.    Physical Examination:  BP 165/77  Pulse 60  Temp(Src) 98.1 F (36.7 C)  Ht 5\' 8"  (1.727 m)  Wt 241 lb 12.8 oz (109.68 kg)  BMI  36.77 kg/m2   General: Well-nourished, well-developed in no acute distress.  Head: Normocephalic, atraumatic.   Eyes: Conjunctiva pink, no icterus. Mouth: Oropharyngeal mucosa moist and pink , no lesions erythema or exudate. Neck: Supple without thyromegaly, masses, or lymphadenopathy.  Lungs: Clear to auscultation bilaterally.  Heart: Regular rate and rhythm, no murmurs rubs or gallops.  Abdomen: Bowel sounds are normal, nontender, nondistended, no hepatosplenomegaly or masses, no abdominal bruits or    hernia , no rebound or guarding.   Rectal: Deferred Extremities: No lower extremity edema. No clubbing or deformities.  Neuro: Alert and oriented x 4 , grossly normal neurologically.  Skin: Warm and dry, no rash or jaundice.   Psych: Alert and cooperative, normal mood and affect.  Labs: Lab Results  Component Value Date   ALT 21 04/12/2014   AST 17 04/12/2014   ALKPHOS 103 04/12/2014   BILITOT 1.1 04/12/2014   Lab Results  Component Value Date   CREATININE 1.1 04/12/2014   BUN 19 04/12/2014   NA 136 04/12/2014   K 3.1* 04/12/2014   CL 99 04/12/2014   CO2 27 04/12/2014   Lab Results  Component Value Date   WBC 9.2 04/12/2014   HGB 13.4 04/12/2014   HCT 40.0 04/12/2014   MCV 93.3 04/12/2014   PLT 216.0 04/12/2014   Lab Results  Component Value Date   TSH 1.417 04/08/2014     Imaging Studies: Ct Abdomen Pelvis W Contrast  04/08/2014   CLINICAL DATA:  Generalized abdominal pain and nausea. History of uterine carcinoma.  EXAM: CT ABDOMEN AND PELVIS WITH CONTRAST  TECHNIQUE: Multidetector CT imaging of the abdomen and pelvis was performed using the standard protocol following bolus administration of intravenous contrast.  CONTRAST:  159mL OMNIPAQUE IOHEXOL 300 MG/ML  SOLN  COMPARISON:  08/18/2007  FINDINGS: Clear lung bases.  Heart is normal in size.  Liver and spleen are unremarkable. Gallbladder surgically absent. Common bile duct is prominent, but shows normal distal tapering. No  intrahepatic bile duct dilation. Normal pancreas. No adrenal masses. Bilateral renal cortical lobulation, stable. No renal masses, stones or hydronephrosis. Normal ureters. Normal bladder.  Uterus is surgically absent.  No pelvic masses.  No adenopathy.  No abnormal fluid collections.  Colon and small bowel are unremarkable. Normal appendix is visualized.  There are significant degenerative changes throughout the visualized spine most prominent at L2-L3 where there is endplate irregularity and sclerosis, which has advanced since the prior study.  IMPRESSION: 1. No acute findings. No findings to explain generalized abdominal pain. 2. Status post cholecystectomy and hysterectomy. 3. Significant degenerative changes throughout the visualized spine which have advanced from the prior CT.   Electronically Signed   By: Lajean Manes M.D.   On: 04/08/2014 18:07

## 2014-05-12 NOTE — Interval H&P Note (Signed)
History and Physical Interval Note:  05/12/2014 11:51 AM  Melissa [name]  has presented today for surgery, with the diagnosis of SCREENING COLONOSCOPY  The various methods of treatment have been discussed with the patient and family. After consideration of risks, benefits and other options for treatment, the patient has consented to  Procedure(s) with comments: COLONOSCOPY (N/A) - 1130 as a surgical intervention .  The patient's history has been reviewed, patient examined, no change in status, stable for surgery.  I have reviewed the patient's chart and labs.  Questions were answered to the patient's satisfaction.     No change in condition. Colonoscopy for screening purposes today per plan.The risks, benefits, limitations, alternatives and imponderables have been reviewed with the patient. Questions have been answered. All parties are agreeable.   [name]

## 2014-05-12 NOTE — Discharge Instructions (Signed)
°  Colonoscopy Discharge Instructions  Read the instructions outlined below and refer to this sheet in the next few weeks. These discharge instructions provide you with general information on caring for yourself after you leave the hospital. Your doctor may also give you specific instructions. While your treatment has been planned according to the most current medical practices available, unavoidable complications occasionally occur. If you have any problems or questions after discharge, call Dr. Gala Romney at [phone]. ACTIVITY  You may resume your regular activity, but move at a slower pace for the next 24 hours.   Take frequent rest periods for the next 24 hours.   Walking will help get rid of the air and reduce the bloated feeling in your belly (abdomen).   No driving for 24 hours (because of the medicine (anesthesia) used during the test).    Do not sign any important legal documents or operate any machinery for 24 hours (because of the anesthesia used during the test).  NUTRITION  Drink plenty of fluids.   You may resume your normal diet as instructed by your doctor.   Begin with a light meal and progress to your normal diet. Heavy or fried foods are harder to digest and may make you feel sick to your stomach (nauseated).   Avoid alcoholic beverages for 24 hours or as instructed.  MEDICATIONS  You may resume your normal medications unless your doctor tells you otherwise.  WHAT YOU CAN EXPECT TODAY  Some feelings of bloating in the abdomen.   Passage of more gas than usual.   Spotting of blood in your stool or on the toilet paper.  IF YOU HAD POLYPS REMOVED DURING THE COLONOSCOPY:  No aspirin products for 7 days or as instructed.   No alcohol for 7 days or as instructed.   Eat a soft diet for the next 24 hours.  FINDING OUT THE RESULTS OF YOUR TEST Not all test results are available during your visit. If your test results are not back during the visit, make an appointment  with your caregiver to find out the results. Do not assume everything is normal if you have not heard from your caregiver or the medical facility. It is important for you to follow up on all of your test results.  SEEK IMMEDIATE MEDICAL ATTENTION IF:  You have more than a spotting of blood in your stool.   Your belly is swollen (abdominal distention).   You are nauseated or vomiting.   You have a temperature over 101.   You have abdominal pain or discomfort that is severe or gets worse throughout the day.    Polyp information provided  Further recommendations to follow pending review of pathology report  Ask Dr. Leanne Chang if he feels she needs a sleep study at her next visit

## 2014-05-15 ENCOUNTER — Encounter: Payer: Self-pay | Admitting: Internal Medicine

## 2014-05-18 ENCOUNTER — Ambulatory Visit: Payer: 59 | Admitting: Neurology

## 2014-05-18 ENCOUNTER — Encounter (HOSPITAL_COMMUNITY): Payer: Self-pay | Admitting: Internal Medicine

## 2014-05-24 ENCOUNTER — Ambulatory Visit (HOSPITAL_BASED_OUTPATIENT_CLINIC_OR_DEPARTMENT_OTHER): Payer: 59 | Admitting: Physical Medicine & Rehabilitation

## 2014-05-24 ENCOUNTER — Encounter: Payer: Self-pay | Admitting: Physical Medicine & Rehabilitation

## 2014-05-24 ENCOUNTER — Encounter: Payer: 59 | Attending: Physical Medicine & Rehabilitation

## 2014-05-24 VITALS — BP 165/72 | HR 69 | Resp 14 | Ht 68.0 in | Wt 237.0 lb

## 2014-05-24 DIAGNOSIS — I69920 Aphasia following unspecified cerebrovascular disease: Secondary | ICD-10-CM

## 2014-05-24 DIAGNOSIS — I619 Nontraumatic intracerebral hemorrhage, unspecified: Secondary | ICD-10-CM | POA: Diagnosis present

## 2014-05-24 DIAGNOSIS — I69320 Aphasia following cerebral infarction: Secondary | ICD-10-CM

## 2014-05-24 DIAGNOSIS — I69919 Unspecified symptoms and signs involving cognitive functions following unspecified cerebrovascular disease: Secondary | ICD-10-CM | POA: Diagnosis not present

## 2014-05-24 NOTE — Progress Notes (Signed)
Subjective:    Patient ID: Melissa [name], female    DOB: September 08, 1951, 63 y.o.   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 General activity 3 Relation with others 3 Enjoyment of life 3 What TIME of day is your pain at its worst? varies Sleep (in general) Good  Pain is worse with: walking Pain improves with: medication Relief from Meds: 3  Mobility walk without assistance how many minutes can you walk? 10 ability to climb steps?  yes do you drive?  yes transfers alone  Function employed # of hrs/week na I need assistance with the following:  household duties  Neuro/Psych No problems in this area  Prior Studies Any changes since last visit?  no  Physicians involved in your care Any changes since last visit?  no   Family History  Problem Relation Age of Onset  . Colon cancer Neg Hx    History   Social History  . Marital Status: Married    Spouse Name: N/A    Number of Children: 1  . Years of Education: college   Occupational History  . Retired    Social History Main Topics  . Smoking status: Never Smoker   . Smokeless tobacco: None     Comment: Never smoked  . Alcohol Use: No  . Drug Use: No  . Sexual Activity: None   Other Topics Concern  . None   Social History Narrative   Patient lives at home with her husband.   Patient drinks soda and coffee diet.   Past Surgical History  Procedure Laterality Date  . Replacement total knee bilateral      bilateral knees replaced twice  . Cholecystectomy    . Abdominal hysterectomy    . Colonoscopy N/A 05/12/2014    Procedure: COLONOSCOPY;  Surgeon: Daneil Dolin, MD;  Location: [facility];  Service: Endoscopy;  Laterality: N/A;  69   Past Medical History  Diagnosis Date  . Hypertension   . Uterine cancer   . Hyperthyroidism     remote  . CVA (cerebral infarction) 11/2013    aphasia   BP 165/72  Pulse 69  Resp 14  Ht 5\' 8"  (1.727 m)  Wt 237 lb (107.502 kg)  BMI 36.04 kg/m2  SpO2 98% Had BP 110/74 at home prior to visit Opioid Risk Score:   Fall Risk Score: Moderate Fall Risk (6-13 points) (pt eductaed declined handout)    Review of Systems  All other systems reviewed and are negative.      Objective:   Physical Exam  Motor strength is 5/5 bilateral deltoid, bicep, tricep, grip, hip flexor, knee extensors, ankle to flex and plantar flexion Sensation intact to light touch and pinprick bilateral upper and lower limbs Fine motor intact finger to thumb opposition bilaterally No evidence of dysdiadochokinesis with rapid alternating pronation supination Gait has some instability she has widened base of support on turns     Assessment & Plan:  1.  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 Has returned to driving with husband, pt doesn't want to drive very  often Still having problems with naming do not think she can return to work from a speech standpoint Patient is pursuing long term disability which I think is appropriate in this case  PM& R. followup when necessary

## 2014-05-24 NOTE — Patient Instructions (Signed)
May need another visit if long term disability forms require certain exam or information

## 2014-05-31 ENCOUNTER — Telehealth: Payer: Self-pay | Admitting: *Deleted

## 2014-05-31 NOTE — Telephone Encounter (Signed)
Patient's husband returned call stating he can be reached at work at [phone] ask for Joneen Caraway or that a message can be left on the home phone with rescheduled appointment.

## 2014-05-31 NOTE — Telephone Encounter (Signed)
Called patient to let her know her appointment was cancelled due to her doctor schedule. Lvm stating just that waiting on a return call from the patient i will try again at the end of my day.

## 2014-05-31 NOTE — Telephone Encounter (Signed)
Called patient and rescheduled her for appointment for 06-29-2014

## 2014-06-03 ENCOUNTER — Ambulatory Visit: Payer: 59 | Admitting: Neurology

## 2014-06-14 ENCOUNTER — Other Ambulatory Visit: Payer: Self-pay | Admitting: *Deleted

## 2014-06-14 MED ORDER — AMLODIPINE BESYLATE 5 MG PO TABS: 5.0000 mg | ORAL_TABLET | Freq: Two times a day (BID) | ORAL | Status: DC

## 2014-06-16 ENCOUNTER — Telehealth: Payer: Self-pay | Admitting: Internal Medicine

## 2014-06-16 MED ORDER — AMLODIPINE BESYLATE 5 MG PO TABS: 5.0000 mg | ORAL_TABLET | Freq: Two times a day (BID) | ORAL | Status: DC

## 2014-06-16 MED ORDER — METOPROLOL TARTRATE 100 MG PO TABS
100.0000 mg | ORAL_TABLET | Freq: Two times a day (BID) | ORAL | Status: DC
Start: 2014-06-16 — End: 2015-01-10

## 2014-06-16 NOTE — Telephone Encounter (Signed)
rx sent in electronically 

## 2014-06-16 NOTE — Telephone Encounter (Signed)
[place] [place] [facility] is requesting 90 day re-fills on the following: amLODipine (NORVASC) 5 MG tablet metoprolol (LOPRESSOR) 100 MG tablet

## 2014-06-23 ENCOUNTER — Telehealth: Payer: Self-pay | Admitting: *Deleted

## 2014-06-23 MED ORDER — ALPRAZOLAM 0.5 MG PO TABS: ORAL_TABLET | ORAL | Status: DC

## 2014-06-23 NOTE — Telephone Encounter (Signed)
Pt was wanting a rx for haldol #5.  She is going to the dentist a couple of times and the last time she went she had a stroke right afterwards.  She is nervous about going back.  Sent a message to Dr Leanne Chang via iodine and asked if pt could have rx and per Dr Leanne Chang call in xanax 0.5 mg take 1 tablet one hour prior to dentist appt #5 no refills. Pt aware, rx called in to [facility]

## 2014-06-29 ENCOUNTER — Ambulatory Visit (INDEPENDENT_AMBULATORY_CARE_PROVIDER_SITE_OTHER): Payer: 59 | Admitting: Neurology

## 2014-06-29 ENCOUNTER — Encounter: Payer: Self-pay | Admitting: Neurology

## 2014-06-29 ENCOUNTER — Other Ambulatory Visit (INDEPENDENT_AMBULATORY_CARE_PROVIDER_SITE_OTHER): Payer: 59

## 2014-06-29 VITALS — BP 155/80 | HR 74 | Ht 67.0 in | Wt 228.4 lb

## 2014-06-29 DIAGNOSIS — I10 Essential (primary) hypertension: Secondary | ICD-10-CM

## 2014-06-29 DIAGNOSIS — I6789 Other cerebrovascular disease: Secondary | ICD-10-CM

## 2014-06-29 LAB — BASIC METABOLIC PANEL
BUN: 18 mg/dL (ref 6–23)
CALCIUM: 10 mg/dL (ref 8.4–10.5)
CO2: 30 meq/L (ref 19–32)
CREATININE: 1.1 mg/dL (ref 0.4–1.2)
Chloride: 99 mEq/L (ref 96–112)
GFR: 55.46 mL/min — AB (ref >60.00)
GLUCOSE: 80 mg/dL (ref 70–99)
Potassium: 3.7 mEq/L (ref 3.5–5.1)
SODIUM: 136 meq/L (ref 135–145)

## 2014-06-29 NOTE — Progress Notes (Signed)
PATIENT: Melissa [name] DOB: 04-26-1951  REASON FOR VISIT: hospital stroke follow up HISTORY FROM: patient  HISTORY OF PRESENT ILLNESS: Melissa [name] is a 63 y.o. female who was in her normal state of health on 12/02/2013. She had a dentist appt at 2:30 pm and then left to go home. She stopped by a [employer] to use the restroom, and after using it, sat down and became confused. EMS was called and she was taken to [place] where a CT head showed a left thalamic hemorrhage with IVH. Of note, she has also noticed swelling in her legs for the past couple of days and her urine has proteinuria. Patient was not administerd TPA secondary to hemorrhage. She was admitted to the neuro ICU for further evaluation and treatment.  MRI of the brain showed acute hemorrhage centered in the left thalamus with extension into the left lateral ventricle. No hydrocephalus.  MRA of the brain Moderate chronic small vessel ischemic disease. No evidence of major intracranial arterial occlusion or high-grade stenosis. Mild bilateral carotid siphon atherosclerosis.   She comes in today for her first office visit post stroke. She has completed outpatient physical therapy and occupational therapy and is still attending speech therapy.  She does not have any residual weakness or numbness or vision problems. Expressive aphasia remains but is much better since discharge.  She states her blood pressure has been hard to control for years and she is working with Dr. Leanne Chang to keep it at goal.  Cholesterol has not been a problem for her. Her hemoglobin A1c in the hospital was elevated at 6.9. She has not been aware that her blood sugars been elevated in the past. Of note, she has lost 30 pounds since hospital admission. UPDATE 06/29/2014 :  She returns for f/u after last visit 6 months ago.She is doing well with only occasional difficulty with names and short term memory  And no new or recurrent stroke symptoms. Her blood pressure is  good at home but she has white coat hypertension and despite taking xanax her blood pressure is elevated at 155/80 in our office today. She remains on multiple BP medicines. No new complaints. REVIEW OF SYSTEMS: Full 14 system review of systems performed and notable only for:  Memory loss, anxeity  and naming difficulties only  ALLERGIES: No Known Allergies  HOME MEDICATIONS: Outpatient Prescriptions Prior to Visit  Medication Sig Dispense Refill  . acetaminophen (TYLENOL) 500 MG tablet Take 500 mg by mouth every 6 (six) hours as needed.      . ALPRAZolam (XANAX) 0.5 MG tablet Take 1 tablet po one hour prior to dentist  5 tablet  0  . amLODipine (NORVASC) 5 MG tablet Take 1 tablet (5 mg total) by mouth 2 (two) times daily.  180 tablet  3  . hydrochlorothiazide (MICROZIDE) 12.5 MG capsule Take 1 capsule (12.5 mg total) by mouth daily.  90 capsule  3  . irbesartan (AVAPRO) 300 MG tablet Take 1 tablet (300 mg total) by mouth daily.  90 tablet  3  . metoprolol (LOPRESSOR) 100 MG tablet Take 1 tablet (100 mg total) by mouth 2 (two) times daily.  180 tablet  3  . hydrALAZINE (APRESOLINE) 10 MG tablet Take 10 mg by mouth 3 (three) times daily as needed.        No facility-administered medications prior to visit.   PHYSICAL EXAM  Filed Vitals:   06/29/14 1439  BP: 155/80  Pulse: 74  Height: 5'  7" (1.702 m)  Weight: 228 lb 6.4 oz (103.602 kg)   Body mass index is 35.76 kg/(m^2).  Generalized: Well developed, in no acute distress, obese Caucasian female Head: normocephalic and atraumatic. Oropharynx benign  Neck: Supple, no carotid bruits  Cardiac: Regular rate rhythm, no murmur  Musculoskeletal: No deformity   Neurological examination  Mentation: Alert oriented to time, place, history taking. Follows all commands, speech with mild expressive aphasia with perseveration. Cranial nerve II-XII:  Pupils were equal round reactive to light extraocular movements were full, visual field were  full on confrontational test. Facial sensation and strength were normal. hearing was intact to finger rubbing bilaterally. Uvula tongue midline. head turning and shoulder shrug and were normal and symmetric.Tongue protrusion into cheek strength was normal. Motor: The motor testing reveals 5 over 5 strength of all 4 extremities. Good symmetric motor tone is noted throughout.  Sensory: Sensory testing is intact to pinprick, soft touch, vibration sensation, and position sense on all 4 extremities. No evidence of extinction is noted.  Coordination: Cerebellar testing reveals good finger-nose-finger and heel-to-shin bilaterally.  Gait and station: Gait is normal. Tandem gait is normal. Romberg is negative. No drift is seen.  Reflexes: Deep tendon reflexes are symmetric and normal bilaterally. Toes are downgoing bilaterally.   DIAGNOSTIC DATA (LABS, IMAGING, TESTING) - I reviewed patient records, labs, notes, testing and imaging myself where available.  Lab Results  Component Value Date   HGBA1C 5.5 04/12/2014    ASSESSMENT AND PLAN Ms. MIKAELA HILGEMAN is a 63 y.o. 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  Hemorrhage felt to be secondary to accelerated hypertension. On no antithrombotics prior to admission. Patient with resultant expressive aphasia with perseveration.  PLAN: I had a long d/w patient and her husband about secondary stroke prevention and need for agrressive blood pressure control. Start Aspirin 81 mg daily. Follow up with Charlott Holler, NP in 6 months or call earlier if necessary.  Antony Contras, MD  06/29/2014, 4:27 PM [facility] [address] [place], [place] [address] [phone]  Note: This document was prepared with digital dictation and possible smart phrase technology. Any transcriptional errors that result from this process are unintentional.

## 2014-06-29 NOTE — Patient Instructions (Addendum)
I had a long d/w patient and her husband about secondary stroke prevention and need for agrressive blood pressure control. Start Aspirin 81 mg daily. Follow up with Charlott Holler, NP in 6 months or call earlier if necessary

## 2014-08-17 ENCOUNTER — Encounter: Payer: Self-pay | Admitting: Neurology

## 2014-11-12 DIAGNOSIS — Z0289 Encounter for other administrative examinations: Secondary | ICD-10-CM

## 2014-12-14 ENCOUNTER — Encounter: Payer: Self-pay | Admitting: Physical Medicine & Rehabilitation

## 2014-12-14 ENCOUNTER — Encounter: Payer: 59 | Attending: Physical Medicine & Rehabilitation

## 2014-12-14 ENCOUNTER — Ambulatory Visit (HOSPITAL_BASED_OUTPATIENT_CLINIC_OR_DEPARTMENT_OTHER): Payer: 59 | Admitting: Physical Medicine & Rehabilitation

## 2014-12-14 VITALS — BP 159/63 | HR 96 | Resp 14

## 2014-12-14 DIAGNOSIS — I619 Nontraumatic intracerebral hemorrhage, unspecified: Secondary | ICD-10-CM | POA: Diagnosis present

## 2014-12-14 NOTE — Progress Notes (Signed)
   Subjective:    Patient ID: Melissa [name], female    DOB: 09/13/1951, 64 y.o.   MRN: [id number]  HPI Difficulty with names, also with word finding difficulties, takes up to 1 minute to think of correct word. Pt denies memory problems. Some overall improvements.  Pain Inventory Average Pain 0 Pain Right Now 0 My pain is no pain  In the last 24 hours, has pain interfered with the following? General activity 0 Relation with others 0 Enjoyment of life 0 What TIME of day is your pain at its worst? no pain Sleep (in general) Good  Pain is worse with: no pain Pain improves with: no pain Relief from Meds: no pain  Mobility walk without assistance  Function disabled: date disabled applying retired I need assistance with the following:  meal prep, household duties and shopping  Neuro/Psych No problems in this area  Prior Studies Any changes since last visit?  no  Physicians involved in your care Any changes since last visit?  no   Family History  Problem Relation Age of Onset  . Colon cancer Neg Hx    History   Social History  . Marital Status: Married    Spouse Name: N/A  . Number of Children: 1  . Years of Education: college   Occupational History  . Retired    Social History Main Topics  . Smoking status: Never Smoker   . Smokeless tobacco: Not on file     Comment: Never smoked  . Alcohol Use: No  . Drug Use: No  . Sexual Activity: Not on file   Other Topics Concern  . None   Social History Narrative   Patient lives at home with her husband.   Patient drinks soda and coffee diet.   Past Surgical History  Procedure Laterality Date  . Replacement total knee bilateral      bilateral knees replaced twice  . Cholecystectomy    . Abdominal hysterectomy    . Colonoscopy N/A 05/12/2014    Procedure: COLONOSCOPY;  Surgeon: Daneil Dolin, MD;  [facility]: [facility];  Service: Endoscopy;  Laterality: N/A;  1130  . Colonoscopy     Past Medical  History  Diagnosis Date  . Hypertension   . Uterine cancer   . Hyperthyroidism     remote  . CVA (cerebral infarction) 11/2013    aphasia   BP 159/63 mmHg  Pulse 96  Resp 14  SpO2 99%  Opioid Risk Score:   Fall Risk Score: Moderate Fall Risk (6-13 points) (educated and given handout)  Review of Systems  All other systems reviewed and are negative.      Objective:   Physical Exam  Motor strength is 5/5 bilateral deltoid, bicep, tricep, grip, hip flexor, knee extensors, ankle to flex and plantar flexion Sensation intact to light touch and pinprick bilateral upper and lower limbs Fine motor intact finger to thumb opposition bilaterally No evidence of dysdiadochokinesis with rapid alternating pronation supination Gait hasno instability Valgus deformity at knees Mini-Mental Status Examination 26/30     Assessment & Plan:  1.  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 No further speech or occupational therapy recommended, no physical therapy needed.  Follow-up physical medicine rehabilitation on an as-needed basis Patient will need a new internal medicine doctor.

## 2014-12-14 NOTE — Patient Instructions (Signed)
Find a new primary MD

## 2014-12-14 NOTE — Progress Notes (Signed)
   Subjective:    Patient ID: Melissa [name], female    DOB: March 18, 1951, 64 y.o.   MRN: [id number]  HPI    Review of Systems     Objective:   Physical Exam        Assessment & Plan:

## 2015-01-10 ENCOUNTER — Encounter: Payer: Self-pay | Admitting: Adult Health

## 2015-01-10 ENCOUNTER — Ambulatory Visit (INDEPENDENT_AMBULATORY_CARE_PROVIDER_SITE_OTHER): Payer: 59 | Admitting: Adult Health

## 2015-01-10 ENCOUNTER — Ambulatory Visit: Payer: 59 | Admitting: Nurse Practitioner

## 2015-01-10 VITALS — BP 151/89 | HR 48 | Ht 67.0 in | Wt 187.0 lb

## 2015-01-10 DIAGNOSIS — Z8673 Personal history of transient ischemic attack (TIA), and cerebral infarction without residual deficits: Secondary | ICD-10-CM | POA: Diagnosis not present

## 2015-01-10 NOTE — Patient Instructions (Signed)
Start Aspirin 81 mg daily. Maintain strict control of hypertension with blood pressure goal below 130/90, diabetes with hemoglobin A1c goal below 6.5% and lipids with LDL cholesterol goal below 100 mg/dL.  If you have any stroke like symptoms call 911 immediately.

## 2015-01-10 NOTE — Progress Notes (Signed)
PATIENT: Melissa [name] DOB: [birth year]  REASON FOR VISIT: follow up- history of CVA HISTORY FROM: patient  HISTORY OF PRESENT ILLNESS: Melissa [name] is a 64 year old female with a history of stroke. She returns today for follow-up. The patient was advised to take aspirin and she states she started this but had to stop due to a procedure and forgot to restart it. She states that she checks her blood pressure at home and it usually runs 120/60. Today in office it is slightly elevated. The patient's last cholesterol showed an LDL of 67 and a hemoglobin A1c of 5.5%. She is currently in the process of getting a new primary care provider. She states since her stroke she has had trouble with her memory however this is intermittent. She states overall it has remained stable. She is able to complete all ADLs independently. The patient has adjusted her diet and is exercising. She states she's lost a total of 115 pounds. She denies any strokelike symptoms. She returns today for evaluation.  HISTORY 06/29/14 [facility]): Melissa [name] is a 64 y.o. female who was in her normal state of health on 12/02/2013. She had a dentist appt at 2:30 pm and then left to go home. She stopped by a [employer] to use the restroom, and after using it, sat down and became confused. EMS was called and she was taken to [place] where a CT head showed a left thalamic hemorrhage with IVH. Of note, she has also noticed swelling in her legs for the past couple of days and her urine has proteinuria. Patient was not administerd TPA secondary to hemorrhage. She was admitted to the neuro ICU for further evaluation and treatment. MRI of the brain showed acute hemorrhage centered in the left thalamus with extension into the left lateral ventricle. No hydrocephalus. MRA of the brain Moderate chronic small vessel ischemic disease. No evidence of major intracranial arterial occlusion or high-grade stenosis. Mild bilateral carotid siphon  atherosclerosis.   She comes in today for her first office visit post stroke. She has completed outpatient physical therapy and occupational therapy and is still attending speech therapy. She does not have any residual weakness or numbness or vision problems. Expressive aphasia remains but is much better since discharge. She states her blood pressure has been hard to control for years and she is working with Dr. Leanne Chang to keep it at goal. Cholesterol has not been a problem for her. Her hemoglobin A1c in the hospital was elevated at 6.9. She has not been aware that her blood sugars been elevated in the past. Of note, she has lost 30 pounds since hospital admission. UPDATE 06/29/2014 : She returns for f/u after last visit 6 months ago.She is doing well with only occasional difficulty with names and short term memory And no new or recurrent stroke symptoms. Her blood pressure is good at home but she has white coat hypertension and despite taking xanax her blood pressure is elevated at 155/80 in our office today. She remains on multiple BP medicines. No new complaints.    REVIEW OF SYSTEMS: Out of a complete 14 system review of symptoms, the patient complains only of the following symptoms, and all other reviewed systems are negative.  See history of present illness  ALLERGIES: No Known Allergies  HOME MEDICATIONS: Outpatient Prescriptions Prior to Visit  Medication Sig Dispense Refill  . acetaminophen (TYLENOL) 500 MG tablet Take 500 mg by mouth every 6 (six) hours as needed.    Marland Kitchen  amLODipine (NORVASC) 5 MG tablet Take 1 tablet (5 mg total) by mouth 2 (two) times daily. 180 tablet 3  . hydrochlorothiazide (MICROZIDE) 12.5 MG capsule Take 1 capsule (12.5 mg total) by mouth daily. (Patient taking differently: Take 12.5 mg by mouth daily. As needed only) 90 capsule 3  . irbesartan (AVAPRO) 300 MG tablet Take 1 tablet (300 mg total) by mouth daily. (Patient taking differently: Take 150 mg by mouth  daily. One half tab daily) 90 tablet 3  . metoprolol (LOPRESSOR) 100 MG tablet Take 1 tablet (100 mg total) by mouth 2 (two) times daily. (Patient taking differently: Take 50 mg by mouth 2 (two) times daily. ) 180 tablet 3   No facility-administered medications prior to visit.    PAST MEDICAL HISTORY: Past Medical History  Diagnosis Date  . Hypertension   . Uterine cancer   . Hyperthyroidism     remote  . CVA (cerebral infarction) 11/2013    aphasia    PAST SURGICAL HISTORY: Past Surgical History  Procedure Laterality Date  . Replacement total knee bilateral      bilateral knees replaced twice  . Cholecystectomy    . Abdominal hysterectomy    . Colonoscopy N/A 05/12/2014    Procedure: COLONOSCOPY;  Surgeon: Daneil Dolin, MD;  Location[facility];  Service: Endoscopy;  Laterality: N/A;  1130  . Colonoscopy      FAMILY HISTORY: Family History  Problem Relation Age of Onset  . Colon cancer Neg Hx     SOCIAL HISTORY: History   Social History  . Marital Status: Married    Spouse Name: N/A  . Number of Children: 1  . Years of Education: college   Occupational History  . s    Social History Main Topics  . Smoking status: Never Smoker   . Smokeless tobacco: Never Used     Comment: Never smoked  . Alcohol Use: No  . Drug Use: No  . Sexual Activity: Not on file   Other Topics Concern  . Not on file   Social History Narrative   Patient lives at home with her husband.   Patient drinks soda and coffee diet.      PHYSICAL EXAM  Filed Vitals:   01/10/15 0903  BP: 151/89  Pulse: 48  Height: 5\' 7"  (1.702 m)  Weight: 187 lb (84.823 kg)   Body mass index is 29.28 kg/(m^2).  Generalized: Well developed, in no acute distress   Neurological examination  Mentation: Alert oriented to time, place, history taking. Follows all commands speech and language fluent Cranial nerve II-XII: Pupils were equal round reactive to light. Extraocular movements were full,  visual field were full on confrontational test. Facial sensation and strength were normal. Uvula tongue midline. Head turning and shoulder shrug  were normal and symmetric. Motor: The motor testing reveals 5 over 5 strength of all 4 extremities. Good symmetric motor tone is noted throughout.  Sensory: Sensory testing is intact to soft touch on all 4 extremities. No evidence of extinction is noted.  Coordination: Cerebellar testing reveals good finger-nose-finger and heel-to-shin bilaterally.  Gait and station: Gait is normal. Tandem gait is normal. Romberg is negative. No drift is seen.  Reflexes: Deep tendon reflexes are symmetric and normal bilaterally.     DIAGNOSTIC DATA (LABS, IMAGING, TESTING) - I reviewed patient records, labs, notes, testing and imaging myself where available.       ASSESSMENT AND PLAN 64 y.o. year old female  has a  past medical history of Hypertension; Uterine cancer; Hyperthyroidism; and CVA (cerebral infarction) (11/2013). here with:  1. History of CVA  Overall the patient has remained stable. She should restart aspirin 81 mg daily. She should maintain strict control of her blood pressure with a goal less than 130/90. She should keep her cholesterol LDL less than 100 and hemoglobin A1c less than 6.5%. The patient should continue with exercise and dieting. Patient advised that if she experiences any strokelike symptoms she should call 911 immediately. She will follow-up in 6 months or sooner if needed.    Ward Givens, MSN, NP-C 01/10/2015, 9:16 AM [facility] [address], [place], [place] [address] [phone]  Note: This document was prepared with digital dictation and possible smart phrase technology. Any transcriptional errors that result from this process are unintentional.

## 2015-01-10 NOTE — Progress Notes (Signed)
I agree with the above plan 

## 2015-06-02 ENCOUNTER — Other Ambulatory Visit (HOSPITAL_COMMUNITY): Payer: Self-pay | Admitting: Internal Medicine

## 2015-06-02 DIAGNOSIS — I639 Cerebral infarction, unspecified: Secondary | ICD-10-CM | POA: Diagnosis not present

## 2015-06-02 DIAGNOSIS — Z1231 Encounter for screening mammogram for malignant neoplasm of breast: Secondary | ICD-10-CM

## 2015-06-02 DIAGNOSIS — Z23 Encounter for immunization: Secondary | ICD-10-CM | POA: Diagnosis not present

## 2015-06-02 DIAGNOSIS — I10 Essential (primary) hypertension: Secondary | ICD-10-CM | POA: Diagnosis not present

## 2015-06-02 DIAGNOSIS — E0590 Thyrotoxicosis, unspecified without thyrotoxic crisis or storm: Secondary | ICD-10-CM | POA: Diagnosis not present

## 2015-06-02 DIAGNOSIS — Z6826 Body mass index (BMI) 26.0-26.9, adult: Secondary | ICD-10-CM | POA: Diagnosis not present

## 2015-06-07 ENCOUNTER — Other Ambulatory Visit: Payer: Self-pay | Admitting: Internal Medicine

## 2015-06-13 ENCOUNTER — Telehealth: Payer: Self-pay | Admitting: Neurology

## 2015-06-13 NOTE — Telephone Encounter (Signed)
Patient's husband is calling asking for a attending physican statement for patient's last visit which to be filled out and faxed to [employer] @800 [phone].  He stated that he will mail the form on 06/14/2015 with a check for $25.

## 2015-06-14 NOTE — Telephone Encounter (Signed)
If patient husbands call back, he needs to make a earlier appt with Jinny Blossom, pt was last seen 12-2014. Form will require current office visit and assessment.  Rn left message for patients husband. Patient was last seen in 12-2014, and has an appt with Jinny Blossom in October 2016. Rn left message stating patient needs to make a earlier appt for the form. The Md will be unable to fill form out from visit in April 2016.

## 2015-06-14 NOTE — Telephone Encounter (Signed)
If the form is for disability the MD has to fill it out. NP can't sign for disability. Thanks.

## 2015-06-15 ENCOUNTER — Ambulatory Visit (HOSPITAL_COMMUNITY)
Admission: RE | Admit: 2015-06-15 | Discharge: 2015-06-15 | Disposition: A | Discharge status: Home / Self Care | Payer: Medicare Other | Source: Ambulatory Visit | Attending: Internal Medicine | Admitting: Internal Medicine

## 2015-06-15 DIAGNOSIS — Z1231 Encounter for screening mammogram for malignant neoplasm of breast: Secondary | ICD-10-CM | POA: Diagnosis present

## 2015-06-15 NOTE — Telephone Encounter (Signed)
Rn call patient because of a disability form. Pt sent a check to [facility] for form for disability to be filled out. Nurse explain that she needs a earlier appt. Nurse stated we can not hold the check and we can void it out. Pt stated to void the check, and she will do a release form and pay on 06-16-15 for her reschedule appt with Dr. Leonie Man. Nurse will give pt the voided check on 9-15 -16 at her next appt. Pt stated she understood and will arrive 15 min earlier. Nurse notified Megan clinical supervisor of patient sending check with [facility].

## 2015-06-16 ENCOUNTER — Telehealth: Payer: Self-pay | Admitting: *Deleted

## 2015-06-16 ENCOUNTER — Ambulatory Visit (INDEPENDENT_AMBULATORY_CARE_PROVIDER_SITE_OTHER): Payer: Medicare Other | Admitting: Neurology

## 2015-06-16 ENCOUNTER — Encounter: Payer: Self-pay | Admitting: Neurology

## 2015-06-16 VITALS — BP 145/63 | HR 53 | Ht 67.0 in | Wt 161.0 lb

## 2015-06-16 DIAGNOSIS — I6990 Unspecified sequelae of unspecified cerebrovascular disease: Secondary | ICD-10-CM

## 2015-06-16 NOTE — Patient Instructions (Signed)
I had a long discussion with the patient and husband regarding her remote left thalamic intracerebral hemorrhage and she is done remarkably well with practically no residual deficits. I complimented her on losing more than 100 pounds since her brain hemorrhage and recommend continuing aggressive blood pressure control with goal below 130/90. She had disability paperwork which I filled out. She has done well since I do not believe further routine schedule appointment with me is necessary. She was asked to have regular follow-up with her primary physician was tight blood pressure control. She may return in the future only if needed.

## 2015-06-16 NOTE — Progress Notes (Signed)
PATIENT: Melissa [name] DOB: Nov 22, 1950  REASON FOR VISIT: follow up- history of CVA HISTORY FROM: patient  HISTORY OF PRESENT ILLNESS: Miss Choi is a 64 year old female with a history of stroke. She returns today for follow-up. The patient was advised to take aspirin and she states she started this but had to stop due to a procedure and forgot to restart it. She states that she checks her blood pressure at home and it usually runs 120/60. Today in office it is slightly elevated. The patient's last cholesterol showed an LDL of 67 and a hemoglobin A1c of 5.5%. She is currently in the process of getting a new primary care provider. She states since her stroke she has had trouble with her memory however this is intermittent. She states overall it has remained stable. She is able to complete all ADLs independently. The patient has adjusted her diet and is exercising. She states she's lost a total of 115 pounds. She denies any strokelike symptoms. She returns today for evaluation.  HISTORY 06/29/14 [facility]): LAILYNN SOUTHGATE is a 64 y.o. female who was in her normal state of health on 12/02/2013. She had a dentist appt at 2:30 pm and then left to go home. She stopped by a [employer] to use the restroom, and after using it, sat down and became confused. EMS was called and she was taken to  where a CT head showed a left thalamic hemorrhage with IVH. Of note, she has also noticed swelling in her legs for the past couple of days and her urine has proteinuria. Patient was not administerd TPA secondary to hemorrhage. She was admitted to the neuro ICU for further evaluation and treatment. MRI of the brain showed acute hemorrhage centered in the left thalamus with extension into the left lateral ventricle. No hydrocephalus. MRA of the brain Moderate chronic small vessel ischemic disease. No evidence of major intracranial arterial occlusion or high-grade stenosis. Mild bilateral carotid siphon  atherosclerosis.   She comes in today for her first office visit post stroke. She has completed outpatient physical therapy and occupational therapy and is still attending speech therapy. She does not have any residual weakness or numbness or vision problems. Expressive aphasia remains but is much better since discharge. She states her blood pressure has been hard to control for years and she is working with Dr. Leanne Chang to keep it at goal. Cholesterol has not been a problem for her. Her hemoglobin A1c in the hospital was elevated at 6.9. She has not been aware that her blood sugars been elevated in the past. Of note, she has lost 30 pounds since hospital admission. UPDATE 06/29/2014 : She returns for f/u after last visit 6 months ago.She is doing well with only occasional difficulty with names and short term memory And no new or recurrent stroke symptoms. Her blood pressure is good at home but she has white coat hypertension and despite taking xanax her blood pressure is elevated at 155/80 in our office today. She remains on multiple BP medicines. No new complaints.  Update 06/16/2015 : She returns for follow-up after last visit in April 2016. Patient states is doing extremely well she is in fact lost 140 pounds over the last year and a half since her in hemorrhage. She exercises were not enough every day the [facility] as well as has been watching her diet and caloric count. She feels her blood pressure is doing great and she hasn't been able to reduce her blood pressure  medicines and today it is slightly elevated at [id number]. She has no neurological deficits and has no neurological complaints today.  REVIEW OF SYSTEMS: Out of a complete 14 system review of symptoms, the patient complains only of the following symptoms, and all other reviewed systems are negative.  See history of present illness  ALLERGIES: No Known Allergies  HOME MEDICATIONS: Outpatient Prescriptions Prior to Visit  Medication Sig  Dispense Refill  . acetaminophen (TYLENOL) 500 MG tablet Take 500 mg by mouth every 6 (six) hours as needed.    Marland Kitchen amLODipine (NORVASC) 5 MG tablet TAKE 1 TABLET BY MOUTH  TWICE A DAY 180 tablet 1  . irbesartan (AVAPRO) 300 MG tablet TAKE 1 TABLET by mouth  daily (Patient taking differently: TAKE 1 TABLET by mouth  daily, 1/4 at night) 90 tablet 1  . metoprolol (LOPRESSOR) 50 MG tablet Take 25 mg by mouth every morning.     . irbesartan (AVAPRO) 150 MG tablet Take 150 mg by mouth at bedtime.    . metoprolol (LOPRESSOR) 100 MG tablet TAKE 1 TABLET BY MOUTH  TWICE A DAY (Patient not taking: Reported on 06/16/2015) 180 tablet 1   No facility-administered medications prior to visit.    PAST MEDICAL HISTORY: Past Medical History  Diagnosis Date  . Hypertension   . Uterine cancer   . Hyperthyroidism     remote  . CVA (cerebral infarction) 11/2013    aphasia    PAST SURGICAL HISTORY: Past Surgical History  Procedure Laterality Date  . Replacement total knee bilateral      bilateral knees replaced twice  . Cholecystectomy    . Abdominal hysterectomy    . Colonoscopy N/A 05/12/2014    Procedure: COLONOSCOPY;  Surgeon: Daneil Dolin, MD;  Location: [facility];  Service: Endoscopy;  Laterality: N/A;  1130  . Colonoscopy      FAMILY HISTORY: Family History  Problem Relation Age of Onset  . Colon cancer Neg Hx     SOCIAL HISTORY: Social History   Social History  . Marital Status: Married    Spouse Name: N/A  . Number of Children: 1  . Years of Education: college   Occupational History  . s    Social History Main Topics  . Smoking status: Never Smoker   . Smokeless tobacco: Never Used     Comment: Never smoked  . Alcohol Use: No  . Drug Use: No  . Sexual Activity: Not on file   Other Topics Concern  . Not on file   Social History Narrative   Patient lives at home with her husband.   Patient drinks soda and coffee diet.      PHYSICAL EXAM  Filed Vitals:    06/16/15 1050  BP: 145/63  Pulse: 53  Height: 5\' 7"  (1.702 m)  Weight: 161 lb (73.029 kg)   Body mass index is 25.21 kg/(m^2).  Generalized: Well developed, in no acute distress   Neurological examination  Mentation: Alert oriented to time, place, history taking. Follows all commands speech and language fluent Cranial nerve II-XII: Pupils were equal round reactive to light. Extraocular movements were full, visual field were full on confrontational test. Facial sensation and strength were normal. Uvula tongue midline. Mild decrease hearing bilaterally. Head turning and shoulder shrug  were normal and symmetric. Motor: The motor testing reveals 5 over 5 strength of all 4 extremities. Good symmetric motor tone is noted throughout.  Sensory: Sensory testing is intact to soft touch on  all 4 extremities. No evidence of extinction is noted.  Coordination: Cerebellar testing reveals good finger-nose-finger and heel-to-shin bilaterally.  Gait and station: Gait is normal. Tandem gait is normal. Romberg is negative. No drift is seen.  Reflexes: Deep tendon reflexes are symmetric and normal bilaterally.     DIAGNOSTIC DATA (LABS, IMAGING, TESTING) - I reviewed patient records, labs, notes, testing and imaging myself where available.       ASSESSMENT AND PLAN 64 y.o. year old female  has a past medical history of Hypertension; Uterine cancer; Hyperthyroidism; and  Left thalamic hemorrhage in March 2015 I had a long discussion with the patient and husband regarding her remote left thalamic intracerebral hemorrhage and she is done remarkably well with practically no residual deficits. I complimented her on losing more than 100 pounds since her brain hemorrhage and recommend continuing aggressive blood pressure control with goal below 130/90. She had disability paperwork which I filled out. She has done well since I do not believe further routine schedule appointment with me is necessary. She was asked  to have regular follow-up with her primary physician was tight blood pressure control. She may return in the future only if needed.    Antony Contras, MD  06/16/2015, 9:55 PM [facility] [address] [place], [place] [address] [phone]  Note: This document was prepared with digital dictation and possible smart phrase technology. Any transcriptional errors that result from this process are unintentional.

## 2015-06-16 NOTE — Telephone Encounter (Signed)
Form Manufacturing engineer received,completed by Dr Leonie Man and Renato Battles faxed 06/16/15.

## 2015-06-16 NOTE — Progress Notes (Unsigned)
Patient was seen by Dr.Sethi today in office. Rn gave patient back her cancel check that arrive on 06-15-15. Patients husband was in the room when nurse gave it back to her.

## 2015-06-17 DIAGNOSIS — Z0289 Encounter for other administrative examinations: Secondary | ICD-10-CM

## 2015-07-02 DIAGNOSIS — Z23 Encounter for immunization: Secondary | ICD-10-CM | POA: Diagnosis not present

## 2015-07-05 ENCOUNTER — Other Ambulatory Visit: Payer: Self-pay | Admitting: Internal Medicine

## 2015-07-05 DIAGNOSIS — R928 Other abnormal and inconclusive findings on diagnostic imaging of breast: Secondary | ICD-10-CM

## 2015-07-12 ENCOUNTER — Ambulatory Visit: Payer: 59 | Admitting: Adult Health

## 2015-07-13 ENCOUNTER — Ambulatory Visit
Admission: RE | Admit: 2015-07-13 | Discharge: 2015-07-13 | Disposition: A | Discharge status: Home / Self Care | Payer: Medicare Other | Source: Ambulatory Visit | Attending: Internal Medicine | Admitting: Internal Medicine

## 2015-07-13 DIAGNOSIS — N6489 Other specified disorders of breast: Secondary | ICD-10-CM | POA: Diagnosis not present

## 2015-07-13 DIAGNOSIS — R928 Other abnormal and inconclusive findings on diagnostic imaging of breast: Secondary | ICD-10-CM | POA: Diagnosis not present

## 2015-09-14 ENCOUNTER — Encounter: Payer: Self-pay | Admitting: Gastroenterology

## 2015-09-14 DIAGNOSIS — I10 Essential (primary) hypertension: Secondary | ICD-10-CM | POA: Diagnosis not present

## 2015-09-14 DIAGNOSIS — Z79899 Other long term (current) drug therapy: Secondary | ICD-10-CM | POA: Diagnosis not present

## 2015-09-14 DIAGNOSIS — M1990 Unspecified osteoarthritis, unspecified site: Secondary | ICD-10-CM | POA: Diagnosis not present

## 2015-09-21 DIAGNOSIS — I10 Essential (primary) hypertension: Secondary | ICD-10-CM | POA: Diagnosis not present

## 2015-09-21 DIAGNOSIS — E030 Congenital hypothyroidism with diffuse goiter: Secondary | ICD-10-CM | POA: Diagnosis not present

## 2015-09-21 DIAGNOSIS — Z6826 Body mass index (BMI) 26.0-26.9, adult: Secondary | ICD-10-CM | POA: Diagnosis not present

## 2015-09-21 DIAGNOSIS — D696 Thrombocytopenia, unspecified: Secondary | ICD-10-CM | POA: Diagnosis not present

## 2015-12-21 DIAGNOSIS — Z79899 Other long term (current) drug therapy: Secondary | ICD-10-CM | POA: Diagnosis not present

## 2015-12-21 DIAGNOSIS — D696 Thrombocytopenia, unspecified: Secondary | ICD-10-CM | POA: Diagnosis not present

## 2015-12-28 DIAGNOSIS — D696 Thrombocytopenia, unspecified: Secondary | ICD-10-CM | POA: Diagnosis not present

## 2015-12-28 DIAGNOSIS — I10 Essential (primary) hypertension: Secondary | ICD-10-CM | POA: Diagnosis not present

## 2015-12-28 DIAGNOSIS — Z23 Encounter for immunization: Secondary | ICD-10-CM | POA: Diagnosis not present

## 2015-12-28 DIAGNOSIS — Z6823 Body mass index (BMI) 23.0-23.9, adult: Secondary | ICD-10-CM | POA: Diagnosis not present

## 2016-01-04 ENCOUNTER — Other Ambulatory Visit (HOSPITAL_COMMUNITY): Payer: Self-pay | Admitting: Internal Medicine

## 2016-01-04 DIAGNOSIS — Z09 Encounter for follow-up examination after completed treatment for conditions other than malignant neoplasm: Secondary | ICD-10-CM

## 2016-01-04 DIAGNOSIS — R928 Other abnormal and inconclusive findings on diagnostic imaging of breast: Secondary | ICD-10-CM

## 2016-01-04 DIAGNOSIS — N6489 Other specified disorders of breast: Secondary | ICD-10-CM

## 2016-01-10 ENCOUNTER — Ambulatory Visit (HOSPITAL_COMMUNITY)
Admission: RE | Admit: 2016-01-10 | Discharge: 2016-01-10 | Disposition: A | Discharge status: Home / Self Care | Payer: Medicare Other | Source: Ambulatory Visit | Attending: Internal Medicine | Admitting: Internal Medicine

## 2016-01-10 DIAGNOSIS — Z09 Encounter for follow-up examination after completed treatment for conditions other than malignant neoplasm: Secondary | ICD-10-CM | POA: Diagnosis not present

## 2016-01-10 DIAGNOSIS — N6489 Other specified disorders of breast: Secondary | ICD-10-CM | POA: Diagnosis not present

## 2016-01-10 DIAGNOSIS — R928 Other abnormal and inconclusive findings on diagnostic imaging of breast: Secondary | ICD-10-CM | POA: Diagnosis not present

## 2016-05-01 DIAGNOSIS — G463 Brain stem stroke syndrome: Secondary | ICD-10-CM | POA: Diagnosis not present

## 2016-05-01 DIAGNOSIS — I10 Essential (primary) hypertension: Secondary | ICD-10-CM | POA: Diagnosis not present

## 2016-06-30 DIAGNOSIS — Z23 Encounter for immunization: Secondary | ICD-10-CM | POA: Diagnosis not present

## 2016-07-02 ENCOUNTER — Other Ambulatory Visit (HOSPITAL_COMMUNITY): Payer: Self-pay | Admitting: Internal Medicine

## 2016-07-02 DIAGNOSIS — Z09 Encounter for follow-up examination after completed treatment for conditions other than malignant neoplasm: Secondary | ICD-10-CM

## 2016-07-02 DIAGNOSIS — N6489 Other specified disorders of breast: Secondary | ICD-10-CM

## 2016-07-10 ENCOUNTER — Ambulatory Visit (HOSPITAL_COMMUNITY)
Admission: RE | Admit: 2016-07-10 | Discharge: 2016-07-10 | Disposition: A | Discharge status: Home / Self Care | Payer: Medicare Other | Source: Ambulatory Visit | Attending: Internal Medicine | Admitting: Internal Medicine

## 2016-07-10 DIAGNOSIS — N6489 Other specified disorders of breast: Secondary | ICD-10-CM | POA: Insufficient documentation

## 2016-07-10 DIAGNOSIS — R928 Other abnormal and inconclusive findings on diagnostic imaging of breast: Secondary | ICD-10-CM | POA: Diagnosis not present

## 2016-07-10 DIAGNOSIS — Z09 Encounter for follow-up examination after completed treatment for conditions other than malignant neoplasm: Secondary | ICD-10-CM

## 2016-09-19 DIAGNOSIS — M1990 Unspecified osteoarthritis, unspecified site: Secondary | ICD-10-CM | POA: Diagnosis not present

## 2016-09-19 DIAGNOSIS — I618 Other nontraumatic intracerebral hemorrhage: Secondary | ICD-10-CM | POA: Diagnosis not present

## 2016-09-19 DIAGNOSIS — Z79899 Other long term (current) drug therapy: Secondary | ICD-10-CM | POA: Diagnosis not present

## 2016-09-19 DIAGNOSIS — I10 Essential (primary) hypertension: Secondary | ICD-10-CM | POA: Diagnosis not present

## 2016-09-27 DIAGNOSIS — Z96652 Presence of left artificial knee joint: Secondary | ICD-10-CM | POA: Diagnosis not present

## 2016-09-27 DIAGNOSIS — I10 Essential (primary) hypertension: Secondary | ICD-10-CM | POA: Diagnosis not present

## 2016-09-27 DIAGNOSIS — Z96651 Presence of right artificial knee joint: Secondary | ICD-10-CM | POA: Diagnosis not present

## 2017-03-11 DIAGNOSIS — I10 Essential (primary) hypertension: Secondary | ICD-10-CM | POA: Diagnosis not present

## 2017-03-11 DIAGNOSIS — Z6827 Body mass index (BMI) 27.0-27.9, adult: Secondary | ICD-10-CM | POA: Diagnosis not present

## 2017-03-11 DIAGNOSIS — Z23 Encounter for immunization: Secondary | ICD-10-CM | POA: Diagnosis not present

## 2017-04-04 ENCOUNTER — Encounter: Payer: Self-pay | Admitting: Internal Medicine

## 2017-06-19 ENCOUNTER — Telehealth: Payer: Self-pay

## 2017-06-19 NOTE — Telephone Encounter (Signed)
[phone] patient due for 3 yr tcs, no current gi problems and has [employer]

## 2017-06-22 DIAGNOSIS — Z23 Encounter for immunization: Secondary | ICD-10-CM | POA: Diagnosis not present

## 2017-06-25 NOTE — Telephone Encounter (Signed)
LMOM to call.

## 2017-06-26 ENCOUNTER — Telehealth: Payer: Self-pay

## 2017-06-26 NOTE — Telephone Encounter (Addendum)
Gastroenterology Pre-Procedure Review  Request Date: 06/26/2017 Requesting Physician: RECALL  LAST COLONOSCOPY 05/12/2014 BY DR. Georgie Chard OF ADENOMATOUS POLYPS  PATIENT REVIEW QUESTIONS: The patient responded to the following health history questions as indicated:    1. Diabetes Melitis: No 2. Joint replacements in the past 12 months: no 3. Major health problems in the past 3 months: no 4. Has an artificial valve or MVP: no 5. Has a defibrillator: no 6. Has been advised in past to take antibiotics in advance of a procedure like teeth cleaning: yes/ knee replacements over 12 years ago 7. Family history of colon cancer: no  8. Alcohol Use: no 9. History of sleep apnea: no  10. History of coronary artery or other vascular stents placed within the last 12 months: no 11. History of any prior anesthesia complications: no    MEDICATIONS & ALLERGIES:    Patient reports the following regarding taking any blood thinners:   Plavix? no Aspirin? no Coumadin? no Brilinta? no Xarelto? no Eliquis? no Pradaxa? no Savaysa? no Effient? no  Patient confirms/reports the following medications:  Current Outpatient Prescriptions  Medication Sig Dispense Refill  . acetaminophen (TYLENOL) 500 MG tablet Take 500 mg by mouth every 6 (six) hours as needed.    Marland Kitchen amLODipine (NORVASC) 5 MG tablet TAKE 1 TABLET BY MOUTH  TWICE A DAY (Patient taking differently: once a day) 180 tablet 1  . irbesartan (AVAPRO) 150 MG tablet Take 150 mg by mouth daily.    . metoprolol tartrate (LOPRESSOR) 25 MG tablet Take 25 mg by mouth daily. 1/2 tablet daily     No current facility-administered medications for this visit.     Patient confirms/reports the following allergies:  No Known Allergies  No orders of the defined types were placed in this encounter.   AUTHORIZATION INFORMATION Primary Insurance:   ID #: Group #:  Pre-Cert / Auth required:  Pre-Cert / Auth #:   Secondary Insurance:   ID #:   Group #:   Pre-Cert / Auth required: Pre-Cert / Auth #:   SCHEDULE INFORMATION: Procedure has been scheduled as follows:  Date:  07/26/2017                 Time: 1:00 pm  Location: [facility] Short Stay  This Gastroenterology Pre-Precedure Review Form is being routed to the following provider(s): R. Garfield Cornea, MD

## 2017-06-26 NOTE — Telephone Encounter (Signed)
Pt said that she had spoken to someone yesterday about scheduling her colonoscopy. I told her that DS said that she would call her back in a few minutes. [phone]

## 2017-06-26 NOTE — Telephone Encounter (Signed)
Tried to call and line is busy.  

## 2017-06-26 NOTE — Telephone Encounter (Signed)
See triage

## 2017-06-27 NOTE — Telephone Encounter (Signed)
Ok to schedule.

## 2017-07-01 ENCOUNTER — Other Ambulatory Visit: Payer: Self-pay

## 2017-07-01 DIAGNOSIS — Z86010 Personal history of colonic polyps: Secondary | ICD-10-CM

## 2017-07-01 MED ORDER — NA SULFATE-K SULFATE-MG SULF 17.5-3.13-1.6 GM/177ML PO SOLN: 1.0000 | ORAL | 0 refills | Status: DC

## 2017-07-01 NOTE — Telephone Encounter (Signed)
Rx sent to the pharmacy and instructions mailed to pt.  

## 2017-07-04 ENCOUNTER — Other Ambulatory Visit (HOSPITAL_COMMUNITY): Payer: Self-pay | Admitting: Internal Medicine

## 2017-07-04 DIAGNOSIS — Z09 Encounter for follow-up examination after completed treatment for conditions other than malignant neoplasm: Secondary | ICD-10-CM

## 2017-07-04 DIAGNOSIS — N6489 Other specified disorders of breast: Secondary | ICD-10-CM

## 2017-07-23 ENCOUNTER — Ambulatory Visit (HOSPITAL_COMMUNITY)
Admission: RE | Admit: 2017-07-23 | Discharge: 2017-07-23 | Disposition: A | Discharge status: Home / Self Care | Payer: Medicare Other | Source: Ambulatory Visit | Attending: Internal Medicine | Admitting: Internal Medicine

## 2017-07-23 DIAGNOSIS — R928 Other abnormal and inconclusive findings on diagnostic imaging of breast: Secondary | ICD-10-CM | POA: Diagnosis not present

## 2017-07-23 DIAGNOSIS — N6489 Other specified disorders of breast: Secondary | ICD-10-CM | POA: Diagnosis not present

## 2017-07-26 ENCOUNTER — Encounter (HOSPITAL_COMMUNITY)
Admission: RE | Disposition: A | Discharge status: Home / Self Care | Payer: Self-pay | Source: Ambulatory Visit | Attending: Internal Medicine

## 2017-07-26 ENCOUNTER — Encounter (HOSPITAL_COMMUNITY): Payer: Self-pay | Admitting: *Deleted

## 2017-07-26 ENCOUNTER — Ambulatory Visit (HOSPITAL_COMMUNITY)
Admission: RE | Admit: 2017-07-26 | Discharge: 2017-07-26 | Disposition: A | Discharge status: Home / Self Care | Payer: Medicare Other | Source: Ambulatory Visit | Attending: Internal Medicine | Admitting: Internal Medicine

## 2017-07-26 DIAGNOSIS — Z7982 Long term (current) use of aspirin: Secondary | ICD-10-CM | POA: Diagnosis not present

## 2017-07-26 DIAGNOSIS — Z8542 Personal history of malignant neoplasm of other parts of uterus: Secondary | ICD-10-CM | POA: Diagnosis not present

## 2017-07-26 DIAGNOSIS — Z96653 Presence of artificial knee joint, bilateral: Secondary | ICD-10-CM | POA: Insufficient documentation

## 2017-07-26 DIAGNOSIS — I10 Essential (primary) hypertension: Secondary | ICD-10-CM | POA: Insufficient documentation

## 2017-07-26 DIAGNOSIS — Z8673 Personal history of transient ischemic attack (TIA), and cerebral infarction without residual deficits: Secondary | ICD-10-CM | POA: Insufficient documentation

## 2017-07-26 DIAGNOSIS — Z79899 Other long term (current) drug therapy: Secondary | ICD-10-CM | POA: Diagnosis not present

## 2017-07-26 DIAGNOSIS — Z86010 Personal history of colonic polyps: Secondary | ICD-10-CM

## 2017-07-26 DIAGNOSIS — E0590 Thyrotoxicosis, unspecified without thyrotoxic crisis or storm: Secondary | ICD-10-CM | POA: Diagnosis not present

## 2017-07-26 DIAGNOSIS — Z1211 Encounter for screening for malignant neoplasm of colon: Secondary | ICD-10-CM | POA: Diagnosis not present

## 2017-07-26 HISTORY — PX: COLONOSCOPY: SHX5424

## 2017-07-26 SURGERY — COLONOSCOPY
Anesthesia: Moderate Sedation

## 2017-07-26 MED ORDER — MIDAZOLAM HCL 5 MG/5ML IJ SOLN
INTRAMUSCULAR | Status: AC
  Filled 2017-07-26: qty 10

## 2017-07-26 MED ORDER — MEPERIDINE HCL 100 MG/ML IJ SOLN
INTRAMUSCULAR | Status: DC | PRN
  Administered 2017-07-26: 25 mg via INTRAVENOUS
  Administered 2017-07-26: 50 mg via INTRAVENOUS
  Administered 2017-07-26: 25 mg via INTRAVENOUS

## 2017-07-26 MED ORDER — ONDANSETRON HCL 4 MG/2ML IJ SOLN
INTRAMUSCULAR | Status: AC
  Filled 2017-07-26: qty 2

## 2017-07-26 MED ORDER — SODIUM CHLORIDE 0.9 % IV SOLN
INTRAVENOUS | Status: DC
  Administered 2017-07-26: 12:00:00 via INTRAVENOUS

## 2017-07-26 MED ORDER — MIDAZOLAM HCL 5 MG/5ML IJ SOLN
INTRAMUSCULAR | Status: DC | PRN
  Administered 2017-07-26: 1 mg via INTRAVENOUS
  Administered 2017-07-26 (×2): 2 mg via INTRAVENOUS

## 2017-07-26 MED ORDER — MEPERIDINE HCL 100 MG/ML IJ SOLN
INTRAMUSCULAR | Status: AC
  Filled 2017-07-26: qty 2

## 2017-07-26 MED ORDER — STERILE WATER FOR IRRIGATION IR SOLN
Status: DC | PRN
  Administered 2017-07-26: 2.5 mL

## 2017-07-26 NOTE — H&P (Signed)
@LOGO @   Primary Care Physician:  Asencion Noble, MD Primary Gastroenterologist:  Dr. Gala Romney  Pre-Procedure History & Physical: HPI:  Melissa [name] is a 66 y.o. female here for surveillance colonoscopy. History of multiple colonic adenomas removed in 2015.  No bowel symptoms. Snoring resolved with the liver weight loss of 150 pounds  Past Medical History:  Diagnosis Date  . 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 Hypertension   . Hyperthyroidism    remote  . Uterine cancer [facility])     Past Surgical History:  Procedure Laterality Date  . ABDOMINAL HYSTERECTOMY    . CHOLECYSTECTOMY    . COLONOSCOPY N/A 05/12/2014   Procedure: COLONOSCOPY;  Surgeon: Daneil Dolin, MD;  Location: [facility];  Service: Endoscopy;  Laterality: N/A;  1130  . REPLACEMENT TOTAL KNEE BILATERAL     bilateral knees replaced twice    Prior to Admission medications   Medication Sig Start Date End Date Taking? Authorizing Provider  acetaminophen (TYLENOL) 500 MG tablet Take 500 mg by mouth daily.    Yes [provider]  amLODipine (NORVASC) 5 MG tablet Take 5 mg by mouth every morning.   Yes [provider]  aspirin EC 81 MG tablet Take 81 mg by mouth daily.   Yes [provider]  irbesartan (AVAPRO) 75 MG tablet Take 75 mg by mouth at bedtime.   Yes [provider]  metoprolol tartrate (LOPRESSOR) 25 MG tablet Take 12.5 mg by mouth every morning. 1/2 tablet daily    Yes [provider]  Multiple Vitamins-Minerals (MULTIVITAMIN WITH MINERALS) tablet Take 1 tablet by mouth daily.   Yes [provider]    Allergies as of 07/01/2017  . (No Known Allergies)    Family History  Problem Relation Age of Onset  . Colon cancer Neg Hx     Social History   Social History  . Marital status: Married    Spouse name: N/A  . Number of children: 1  . Years of education: college   Occupational History  . s [employer]  Topics  . Smoking status: Never Smoker  . Smokeless tobacco: Never Used     Comment: Never smoked  . Alcohol use No  . Drug use: No  . Sexual activity: Not on file   Other Topics Concern  . Not on file   Social History Narrative   Patient lives at home with her husband.   Patient drinks soda and coffee diet.    Review of Systems: See HPI, otherwise negative ROS  Physical Exam: BP (!) 177/70   Pulse 68   Temp 98.2 F (36.8 C) (Oral)   Resp 19   Ht 5\' 8"  (1.727 m)   Wt 175 lb (79.4 kg)   SpO2 100%   BMI 26.61 kg/m  General:   Alert,  Well-developed, well-nourished, pleasant and cooperative in NAD Neck:  Supple; no masses or thyromegaly. No significant cervical adenopathy. Lungs:  Clear throughout to auscultation.   No wheezes, crackles, or rhonchi. No acute distress. Heart:  Regular rate and rhythm; no murmurs, clicks, rubs,  or gallops. Abdomen: Non-distended, normal bowel sounds.  Soft and nontender without appreciable mass or hepatosplenomegaly.  Pulses:  Normal pulses noted. Extremities:  Without clubbing or edema.  Impression:   66 year old lady here for surveillance colonoscopy. History of multiple colonic adenomas removed previously. Recommendations:  I have offered the patient a surveillance colonoscopy.The risks, benefits, limitations, alternatives  and imponderables have been reviewed with the patient. Questions have been answered. All parties are agreeable.     Notice: This dictation was prepared with Dragon dictation along with smaller phrase technology. Any transcriptional errors that result from this process are unintentional and may not be corrected upon review.

## 2017-07-26 NOTE — Op Note (Signed)
[facility] Patient Name: Melissa [name] Procedure Date: 07/26/2017 12:18 PM MRN: [id number] Date of Birth: 02/15/1951 Attending MD: Norvel Richards , MD CSN: [id number] Age: 66 Admit Type: Outpatient Procedure:                Colonoscopy Indications:              High risk colon cancer surveillance: Personal                            history of colonic polyps Providers:                Norvel Richards, MD, Lurline Del, RN, Purcell Nails.                            [place], Merchant navy officer Referring MD:              Medicines:                Midazolam 5 mg IV, Meperidine 100 mg IV,                            Ondansetron 4 mg IV Complications:            No immediate complications. Estimated Blood Loss:     Estimated blood loss: none. Procedure:                Pre-Anesthesia Assessment:                           - Prior to the procedure, a History and Physical                            was performed, and patient medications and                            allergies were reviewed. The patient's tolerance of                            previous anesthesia was also reviewed. The risks                            and benefits of the procedure and the sedation                            options and risks were discussed with the patient.                            All questions were answered, and informed consent                            was obtained. Prior Anticoagulants: The patient has                            taken no previous anticoagulant or antiplatelet  agents. ASA Grade Assessment: II - A patient with                            mild systemic disease. After reviewing the risks                            and benefits, the patient was deemed in                            satisfactory condition to undergo the procedure.                           After obtaining informed consent, the colonoscope                            was passed under direct vision.  Throughout the                            procedure, the patient's blood pressure, pulse, and                            oxygen saturations were monitored continuously. The                            EC-[id number] ([id number]) scope was introduced through                            the anus and advanced to the the cecum, identified                            by appendiceal orifice and ileocecal valve. The                            colonoscopy was performed without difficulty. The                            patient tolerated the procedure well. The quality                            of the bowel preparation was adequate. The                            ileocecal valve, appendiceal orifice, and rectum                            were photographed. The entire colon was well                            visualized. Scope In: 12:26:47 PM Scope Out: 12:42:54 PM Scope Withdrawal Time: 0 hours 6 minutes 19 seconds  Total Procedure Duration: 0 hours 16 minutes 7 seconds  Findings:      The perianal and digital rectal examinations were normal.      The entire examined colon appeared normal on direct and retroflexion  views. Impression:               - The entire examined colon is normal on direct and                            retroflexion views.                           - No specimens collected. Moderate Sedation:      Moderate (conscious) sedation was administered by the endoscopy nurse       and supervised by the endoscopist. The following parameters were       monitored: oxygen saturation, heart rate, blood pressure, respiratory       rate, EKG, adequacy of pulmonary ventilation, and response to care.       Total physician intraservice time was 20 minutes. Recommendation:           - Patient has a contact number available for                            emergencies. The signs and symptoms of potential                            delayed complications were discussed with the                             patient. Return to normal activities tomorrow.                            Written discharge instructions were provided to the                            patient.                           - Resume previous diet.                           - Continue present medications.                           - Repeat colonoscopy in 5 years for surveillance.                           - Return to GI clinic PRN. Procedure Code(s):        --- Professional ---                           [phone], Colonoscopy, flexible; diagnostic, including                            collection of specimen(s) by brushing or washing,                            when performed (separate procedure)  16109, Moderate sedation services provided by the                            same physician or other qualified health care                            professional performing the diagnostic or                            therapeutic service that the sedation supports,                            requiring the presence of an independent trained                            observer to assist in the monitoring of the                            patient's level of consciousness and physiological                            status; initial 15 minutes of intraservice time,                            patient age 59 years or older Diagnosis Code(s):        --- Professional ---                           Z86.010, Personal history of colonic polyps CPT copyright 2016 American Medical Association. All rights reserved. The codes documented in this report are preliminary and upon coder review may  be revised to meet current compliance requirements. Cristopher Estimable. Tacoya Altizer, MD Norvel Richards, MD 07/26/2017 12:47:02 PM This report has been signed electronically. Number of Addenda: 0

## 2017-07-26 NOTE — Discharge Instructions (Signed)

## 2017-07-30 ENCOUNTER — Encounter (HOSPITAL_COMMUNITY): Payer: Self-pay | Admitting: Internal Medicine

## 2017-08-31 ENCOUNTER — Other Ambulatory Visit: Payer: Self-pay

## 2017-08-31 ENCOUNTER — Emergency Department (HOSPITAL_COMMUNITY): Payer: Medicare Other

## 2017-08-31 ENCOUNTER — Encounter (HOSPITAL_COMMUNITY): Payer: Self-pay | Admitting: *Deleted

## 2017-08-31 ENCOUNTER — Observation Stay (HOSPITAL_COMMUNITY)
Admission: EM | Admit: 2017-08-31 | Discharge: 2017-09-02 | Disposition: A | Discharge status: Home / Self Care | Payer: Medicare Other | Attending: Internal Medicine | Admitting: Internal Medicine

## 2017-08-31 DIAGNOSIS — Z7982 Long term (current) use of aspirin: Secondary | ICD-10-CM | POA: Insufficient documentation

## 2017-08-31 DIAGNOSIS — R000 Tachycardia, unspecified: Secondary | ICD-10-CM | POA: Diagnosis not present

## 2017-08-31 DIAGNOSIS — I10 Essential (primary) hypertension: Secondary | ICD-10-CM | POA: Diagnosis not present

## 2017-08-31 DIAGNOSIS — Z8542 Personal history of malignant neoplasm of other parts of uterus: Secondary | ICD-10-CM | POA: Diagnosis not present

## 2017-08-31 DIAGNOSIS — I160 Hypertensive urgency: Secondary | ICD-10-CM | POA: Diagnosis not present

## 2017-08-31 DIAGNOSIS — I619 Nontraumatic intracerebral hemorrhage, unspecified: Secondary | ICD-10-CM | POA: Diagnosis present

## 2017-08-31 DIAGNOSIS — Z79899 Other long term (current) drug therapy: Secondary | ICD-10-CM | POA: Insufficient documentation

## 2017-08-31 DIAGNOSIS — I4891 Unspecified atrial fibrillation: Principal | ICD-10-CM | POA: Diagnosis present

## 2017-08-31 DIAGNOSIS — I618 Other nontraumatic intracerebral hemorrhage: Secondary | ICD-10-CM | POA: Diagnosis not present

## 2017-08-31 LAB — CBC WITH DIFFERENTIAL/PLATELET
BASOS PCT: 1 %
Basophils Absolute: 0 10*3/uL (ref 0.0–0.1)
Eosinophils Absolute: 0.2 10*3/uL (ref 0.0–0.7)
Eosinophils Relative: 3 %
HEMATOCRIT: 41 % (ref 36.0–46.0)
HEMOGLOBIN: 13.5 g/dL (ref 12.0–15.0)
LYMPHS ABS: 1.4 10*3/uL (ref 0.7–4.0)
Lymphocytes Relative: 23 %
MCH: 31.6 pg (ref 26.0–34.0)
MCHC: 32.9 g/dL (ref 30.0–36.0)
MCV: 96 fL (ref 78.0–100.0)
MONO ABS: 0.6 10*3/uL (ref 0.1–1.0)
MONOS PCT: 10 %
NEUTROS ABS: 3.8 10*3/uL (ref 1.7–7.7)
Neutrophils Relative %: 63 %
PLATELETS: 156 10*3/uL (ref 150–400)
RBC: 4.27 MIL/uL (ref 3.87–5.11)
RDW: 12.5 % (ref 11.5–15.5)
WBC: 6.1 10*3/uL (ref 4.0–10.5)

## 2017-08-31 LAB — COMPREHENSIVE METABOLIC PANEL
ALBUMIN: 4.4 g/dL (ref 3.5–5.0)
ALT: 24 U/L (ref 14–54)
ANION GAP: 9 (ref 5–15)
AST: 29 U/L (ref 15–41)
Alkaline Phosphatase: 149 U/L — ABNORMAL HIGH (ref 38–126)
BILIRUBIN TOTAL: 0.7 mg/dL (ref 0.3–1.2)
BUN: 23 mg/dL — ABNORMAL HIGH (ref 6–20)
CHLORIDE: 104 mmol/L (ref 101–111)
CO2: 27 mmol/L (ref 22–32)
Calcium: 9.6 mg/dL (ref 8.9–10.3)
Creatinine, Ser: 0.89 mg/dL (ref 0.44–1.00)
GFR calc Af Amer: >60 mL/min (ref >60)
GFR calc non Af Amer: >60 mL/min (ref >60)
GLUCOSE: 147 mg/dL — AB (ref 65–99)
POTASSIUM: 3.7 mmol/L (ref 3.5–5.1)
SODIUM: 140 mmol/L (ref 135–145)
TOTAL PROTEIN: 7.7 g/dL (ref 6.5–8.1)

## 2017-08-31 LAB — TROPONIN I: Troponin I: <0.03 ng/mL (ref <0.03)

## 2017-08-31 MED ORDER — ONDANSETRON HCL 4 MG/2ML IJ SOLN
4.0000 mg | Freq: Once | INTRAMUSCULAR | Status: AC
  Administered 2017-08-31: 4 mg via INTRAVENOUS
  Filled 2017-08-31: qty 2

## 2017-08-31 MED ORDER — IRBESARTAN 75 MG PO TABS
75.0000 mg | ORAL_TABLET | Freq: Every day | ORAL | Status: DC
  Administered 2017-09-01 (×2): 75 mg via ORAL
  Filled 2017-08-31 (×2): qty 1

## 2017-08-31 MED ORDER — ALPRAZOLAM 0.25 MG PO TABS: 0.2500 mg | ORAL_TABLET | Freq: Two times a day (BID) | ORAL | Status: DC | PRN

## 2017-08-31 MED ORDER — ZOLPIDEM TARTRATE 5 MG PO TABS
5.0000 mg | ORAL_TABLET | Freq: Every evening | ORAL | Status: DC | PRN
  Administered 2017-09-01: 5 mg via ORAL
  Filled 2017-08-31: qty 1

## 2017-08-31 MED ORDER — LABETALOL HCL 5 MG/ML IV SOLN: 10.0000 mg | INTRAVENOUS | Status: DC | PRN

## 2017-08-31 MED ORDER — SODIUM CHLORIDE 0.9 % IV BOLUS (SEPSIS)
500.0000 mL | Freq: Once | INTRAVENOUS | Status: AC
  Administered 2017-08-31: 500 mL via INTRAVENOUS

## 2017-08-31 MED ORDER — ONDANSETRON HCL 4 MG/2ML IJ SOLN: 4.0000 mg | Freq: Four times a day (QID) | INTRAMUSCULAR | Status: DC | PRN

## 2017-08-31 MED ORDER — ASPIRIN EC 81 MG PO TBEC
81.0000 mg | DELAYED_RELEASE_TABLET | Freq: Every day | ORAL | Status: DC
Start: 2017-09-01 — End: 2017-09-02
  Administered 2017-09-01 – 2017-09-02 (×2): 81 mg via ORAL
  Filled 2017-08-31 (×2): qty 1

## 2017-08-31 MED ORDER — DILTIAZEM HCL 25 MG/5ML IV SOLN
10.0000 mg | Freq: Once | INTRAVENOUS | Status: AC
  Administered 2017-08-31: 10 mg via INTRAVENOUS
  Filled 2017-08-31: qty 5

## 2017-08-31 MED ORDER — SODIUM CHLORIDE 0.9 % IV SOLN
INTRAVENOUS | Status: DC
Start: 2017-08-31 — End: 2017-08-31
  Administered 2017-08-31: 22:00:00 via INTRAVENOUS

## 2017-08-31 MED ORDER — ADULT MULTIVITAMIN W/MINERALS CH
1.0000 | ORAL_TABLET | Freq: Every day | ORAL | Status: DC
  Administered 2017-09-01 – 2017-09-02 (×2): 1 via ORAL
  Filled 2017-08-31 (×2): qty 1

## 2017-08-31 MED ORDER — DILTIAZEM HCL-DEXTROSE 100-5 MG/100ML-% IV SOLN (PREMIX)
5.0000 mg/h | Freq: Once | INTRAVENOUS | Status: AC
  Administered 2017-08-31: 5 mg/h via INTRAVENOUS
  Filled 2017-08-31: qty 100

## 2017-08-31 MED ORDER — METOPROLOL TARTRATE 25 MG PO TABS: 12.5000 mg | ORAL_TABLET | Freq: Every day | ORAL | Status: DC

## 2017-08-31 MED ORDER — AMLODIPINE BESYLATE 5 MG PO TABS
5.0000 mg | ORAL_TABLET | Freq: Every day | ORAL | Status: DC
  Administered 2017-09-01 – 2017-09-02 (×2): 5 mg via ORAL
  Filled 2017-08-31 (×2): qty 1

## 2017-08-31 MED ORDER — ACETAMINOPHEN 325 MG PO TABS: 650.0000 mg | ORAL_TABLET | ORAL | Status: DC | PRN

## 2017-08-31 NOTE — H&P (Signed)
History and Physical    Melissa [name] LKG:[id number] DOB: 04-Aug-1951 DOA: 08/31/2017  PCP: Asencion Noble, MD   Patient coming from: Home  Chief Complaint: Palpitations   HPI: Melissa [name] is a 66 y.o. female with medical history significant for left thalamic hemorrhagic infarct in 2015, remote hypothyroidism, and hypertension, presented to the emergency department for evaluation of palpitations.  Patient reports that she been in her usual state of health and was having an uneventful day until this evening when she noted palpitations.  She checked her heart rate at home, noted that it was 153, and also notes that she was hypertensive with systolic pressure in the 664Q at that time.  Symptoms persisted and she eventually came to the ED for evaluation.  She denies any chest pain or dyspnea.  She has never experienced similar symptoms previously.  ED Course: Upon arrival to the ED, patient is found to be afebrile, saturating well on room air, tachycardic to 149, and blood pressure of 210/120.  EKG features atrial fibrillation with RVR, rate 149.  Chest x-rays negative for acute cardiopulmonary disease.  Chemistry panel is unremarkable and CBC is within normal limits.  Patient was given 500 cc normal saline and 10 mg IV diltiazem in the heart rate improved to the 120s after the diltiazem IVP and she was started on diltiazem infusion his blood pressures remained stable and the patient acute respiratory distress.  She will be  observed in the stepdown unit for ongoing evaluation and management of new onset atrial fibrillation with RVR.  Review of Systems:  All other systems reviewed and apart from HPI, are negative.  Past Medical History:  Diagnosis Date  . 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 Hypertension   . Hyperthyroidism    remote  . Uterine cancer [facility])     Past Surgical History:  Procedure Laterality Date  . ABDOMINAL HYSTERECTOMY    . CHOLECYSTECTOMY    . COLONOSCOPY  N/A 05/12/2014   Procedure: COLONOSCOPY;  Surgeon: Daneil Dolin, MD;  [facility];  Service: Endoscopy;  Laterality: N/A;  1130  . COLONOSCOPY N/A 07/26/2017   Procedure: COLONOSCOPY;  Surgeon: Daneil Dolin, MD;  Location: [facility];  Service: Endoscopy;  Laterality: N/A;  1:00 pm  . REPLACEMENT TOTAL KNEE BILATERAL     bilateral knees replaced twice     reports that  has never smoked. she has never used smokeless tobacco. She reports that she does not drink alcohol or use drugs.  No Known Allergies  Family History  Problem Relation Age of Onset  . Colon cancer Neg Hx      Prior to Admission medications   Medication Sig Start Date End Date Taking? Authorizing Provider  acetaminophen (TYLENOL) 500 MG tablet Take 500 mg by mouth daily.    Yes [provider]  amLODipine (NORVASC) 5 MG tablet Take 5 mg by mouth every morning.   Yes [provider]  aspirin EC 81 MG tablet Take 81 mg by mouth daily.   Yes [provider]  irbesartan (AVAPRO) 75 MG tablet Take 75 mg by mouth at bedtime.   Yes [provider]  metoprolol tartrate (LOPRESSOR) 25 MG tablet Take 12.5 mg by mouth every morning. 1/2 tablet daily    Yes [provider]  Multiple Vitamins-Minerals (MULTIVITAMIN WITH MINERALS) tablet Take 1 tablet by mouth daily.   Yes [provider]    Physical Exam: Vitals:   08/31/17  2130 08/31/17 2200 08/31/17 2215 08/31/17 2230  BP: (!) 158/93 (!) 145/79 (!) 159/101 111/76  Pulse: (!) 147 (!) 142 (!) 125 (!) 133  Resp: 12 (!) 22 11 14   Temp:      TempSrc:      SpO2: 100% 97% 95% 99%  Weight:      Height:          Constitutional: NAD, appears anxious Eyes: PERTLA, lids and conjunctivae normal ENMT: Mucous membranes are moist. Posterior pharynx clear of any exudate or lesions.   Neck: normal, supple, no masses, no thyromegaly Respiratory: clear to auscultation bilaterally, no wheezing, no crackles. Normal  respiratory effort.   Cardiovascular: Rate ~120 and irregular. No extremity edema. No significant JVD. Abdomen: No distension, no tenderness, no masses palpated. Bowel sounds normal.  Musculoskeletal: no clubbing / cyanosis. No joint deformity upper and lower extremities.   Skin: no significant rashes, lesions, ulcers. Warm, dry, well-perfused. Neurologic: CN 2-12 grossly intact. Sensation intact. Strength 5/5 in all 4 limbs.  Psychiatric: Alert and oriented x 3. Pleasant, cooperative.     Labs on Admission: I have personally reviewed following labs and imaging studies  CBC: Recent Labs  Lab 08/31/17 2135  WBC 6.1  NEUTROABS 3.8  HGB 13.5  HCT 41.0  MCV 96.0  PLT 585   Basic Metabolic Panel: Recent Labs  Lab 08/31/17 2135  NA 140  K 3.7  CL 104  CO2 27  GLUCOSE 147*  BUN 23*  CREATININE 0.89  CALCIUM 9.6   GFR: Estimated Creatinine Clearance: 67 mL/min (by C-G formula based on SCr of 0.89 mg/dL). Liver Function Tests: Recent Labs  Lab 08/31/17 2135  AST 29  ALT 24  ALKPHOS 149*  BILITOT 0.7  PROT 7.7  ALBUMIN 4.4   No results for input(s): LIPASE, AMYLASE in the last 168 hours. No results for input(s): AMMONIA in the last 168 hours. Coagulation Profile: No results for input(s): INR, PROTIME in the last 168 hours. Cardiac Enzymes: No results for input(s): CKTOTAL, CKMB, CKMBINDEX, TROPONINI in the last 168 hours. BNP (last 3 results) No results for input(s): PROBNP in the last 8760 hours. HbA1C: No results for input(s): HGBA1C in the last 72 hours. CBG: No results for input(s): GLUCAP in the last 168 hours. Lipid Profile: No results for input(s): CHOL, HDL, LDLCALC, TRIG, CHOLHDL, LDLDIRECT in the last 72 hours. Thyroid Function Tests: No results for input(s): TSH, T4TOTAL, FREET4, T3FREE, THYROIDAB in the last 72 hours. Anemia Panel: No results for input(s): VITAMINB12, FOLATE, FERRITIN, TIBC, IRON, RETICCTPCT in the last 72 hours. Urine analysis:     Component Value Date/Time   COLORURINE RED (A) 04/02/2014 0310   APPEARANCEUR HAZY (A) 04/02/2014 0310   LABSPEC 1.010 04/02/2014 0310   PHURINE 6.0 04/02/2014 0310   GLUCOSEU NEGATIVE 04/02/2014 0310   HGBUR LARGE (A) 04/02/2014 0310   BILIRUBINUR NEGATIVE 04/02/2014 0310   KETONESUR NEGATIVE 04/02/2014 0310   PROTEINUR 100 (A) 04/02/2014 0310   UROBILINOGEN 0.2 04/02/2014 0310   NITRITE NEGATIVE 04/02/2014 0310   LEUKOCYTESUR MODERATE (A) 04/02/2014 0310   Sepsis Labs: @LABRCNTIP (procalcitonin:4,lacticidven:4) )No results found for this or any previous visit (from the past 240 hour(s)).   Radiological Exams on Admission: Dg Chest Port 1 View  Result Date: 08/31/2017 CLINICAL DATA:  Tachycardia. EXAM: PORTABLE CHEST 1 VIEW COMPARISON:  December 06, 2013 FINDINGS: The heart size and mediastinal contours are within normal limits. Both lungs are clear. The visualized skeletal structures are unremarkable. IMPRESSION: No active  disease. Electronically Signed   By: Dorise Bullion III M.D   On: 08/31/2017 22:09    EKG: Independently reviewed. Atrial fibrillation with RVR (rate 149).   Assessment/Plan  1. New-onset atrial fibrillation with RVR  - Presents with palpitations that began a few hours prior to arrival, noted to be in atrial fibrillation with rate 140-160's  - Given IVP diltiazem in ED and started on diltiazem infusion with improvement in rate  - No chest pain to suggest an ischemic etiology, will check troponin level; reports hx of hyperthyroidism, thyroid studies wnl in 2015 - CHADS-VASc is 47 (age, gender, CVA x2, HTN)  - She has hx of hemorrhagic CVA and may not be appropriate for anticoagulation  - Plan to check thyroid studies, check troponin, continue diltiazem infusion, continue cardiac monitoring    2. Hx of hemorrhagic CVA  - Left thalamic hemorrhagic infarct in 2015   - No evidence for acute CVA  - Follows with neurology and managed with ASA 81, will continue   3.  Hypertension with hypertensive urgency  - BP elevated to 210/120 range on arrival, improved some with IV diltiazem in ED  - Plan to continue Norvasc, irbesartan, and Lopressor, use labetalol IVP's prn     DVT prophylaxis: sq heparin  Code Status: Full  Family Communication: Discussed with patient Disposition Plan: Observe in SDU Consults called: None Admission status: Observation    Vianne Bulls, MD [facility] Pager [phone]  If 7PM-7AM, please contact night-coverage [email or web address] Password [email or web address]  08/31/2017, 10:35 PM

## 2017-08-31 NOTE — ED Triage Notes (Signed)
Pt with tachycardia and htn at home 180/? And hr 153 since 1600 today. Pt denies pain.

## 2017-08-31 NOTE — ED Provider Notes (Signed)
[facility] EMERGENCY DEPARTMENT Provider Note   CSN: [id number] Arrival date & time: 08/31/17  2104     History   Chief Complaint Chief Complaint  Patient presents with  . Tachycardia    HPI Melissa [name] is a 66 y.o. female.  Patient with complaint of rapid heart rate that started at 4 PM this afternoon.  No nausea no vomiting no shortness of breath no chest pain.  Patient's never had this happen before.  It has been persistent since it started.  Past medical history significant for hypertension remote hyperthyroidism past CVA.  In 2015.      Past Medical History:  Diagnosis Date  . 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 Hypertension   . Hyperthyroidism    remote  . Uterine cancer [facility])     Patient Active Problem List   Diagnosis Date Noted  . Cognitive deficits, late effect of cerebrovascular disease 05/24/2014  . Abdominal pain, unspecified site 04/26/2014  . Hyperglycemia 02/17/2014  . Severe obesity (BMI >= 40) ([place]) 01/28/2014  . Aphasia due to recent cerebrovascular accident 01/07/2014  . Thalamic hemorrhage with stroke ([place]) 12/02/2013  . ADENOCARCINOMA, ENDOMETRIUM 08/11/2007  . HYPERTHYROIDISM 05/16/2007  . HYPERTENSION 05/16/2007  . OSTEOARTHRITIS 05/16/2007    Past Surgical History:  Procedure Laterality Date  . ABDOMINAL HYSTERECTOMY    . CHOLECYSTECTOMY    . COLONOSCOPY N/A 05/12/2014   Procedure: COLONOSCOPY;  Surgeon: Daneil Dolin, MD;  [facility];  Service: Endoscopy;  Laterality: N/A;  1130  . COLONOSCOPY N/A 07/26/2017   Procedure: COLONOSCOPY;  Surgeon: Daneil Dolin, MD;  Location: [facility];  Service: Endoscopy;  Laterality: N/A;  1:00 pm  . REPLACEMENT TOTAL KNEE BILATERAL     bilateral knees replaced twice    OB History    No data available       Home Medications    Prior to Admission medications   Medication Sig Start Date End Date Taking? Authorizing Provider  acetaminophen (TYLENOL) 500  MG tablet Take 500 mg by mouth daily.     [provider]  amLODipine (NORVASC) 5 MG tablet Take 5 mg by mouth every morning.    [provider]  aspirin EC 81 MG tablet Take 81 mg by mouth daily.    [provider]  irbesartan (AVAPRO) 75 MG tablet Take 75 mg by mouth at bedtime.    [provider]  metoprolol tartrate (LOPRESSOR) 25 MG tablet Take 12.5 mg by mouth every morning. 1/2 tablet daily     [provider]  Multiple Vitamins-Minerals (MULTIVITAMIN WITH MINERALS) tablet Take 1 tablet by mouth daily.    [provider]    Family History Family History  Problem Relation Age of Onset  . Colon cancer Neg Hx     Social History Social History   Tobacco Use  . Smoking status: Never Smoker  . Smokeless tobacco: Never Used  . Tobacco comment: Never smoked  Substance Use Topics  . Alcohol use: No    Alcohol/week: 0.0 oz  . Drug use: No     Allergies   Patient has no known allergies.   Review of Systems Review of Systems  Constitutional: Negative for fever.  HENT: Negative for congestion.   Eyes: Negative for visual disturbance.  Respiratory: Negative for shortness of breath.   Cardiovascular: Positive for palpitations. Negative for chest pain and leg swelling.  Gastrointestinal: Negative for abdominal pain, nausea and vomiting.  Genitourinary: Negative for dysuria.  Musculoskeletal: Negative for back pain.  Skin: Negative for rash.  Neurological: Negative for syncope and headaches.  Hematological: Does not bruise/bleed easily.  Psychiatric/Behavioral: Negative for confusion.     Physical Exam Updated Vital Signs BP (!) 145/79   Pulse (!) 142   Temp 97.9 F (36.6 C) (Oral)   Resp (!) 22   Ht 1.727 m (5\' 8" )   Wt 77.1 kg (170 lb)   SpO2 97%   BMI 25.85 kg/m   Physical Exam  Constitutional: She is oriented to person, place, and time. She appears well-developed and well-nourished. She appears distressed.    HENT:  Head: Normocephalic and atraumatic.  Mouth/Throat: Oropharynx is clear and moist.  Eyes: Conjunctivae and EOM are normal. Pupils are equal, round, and reactive to light.  Neck: Normal range of motion. Neck supple.  Cardiovascular:  No murmur heard. Significant tachycardia irregular  Pulmonary/Chest: Effort normal and breath sounds normal. She has no wheezes. She has no rales.  Abdominal: Soft. Bowel sounds are normal. There is no tenderness.  Musculoskeletal: Normal range of motion. She exhibits no edema.  Neurological: She is alert and oriented to person, place, and time. No cranial nerve deficit or sensory deficit. She exhibits normal muscle tone. Coordination normal.  Skin: Skin is warm.  Nursing note and vitals reviewed.    ED Treatments / Results  Labs (all labs ordered are listed, but only abnormal results are displayed) Labs Reviewed  COMPREHENSIVE METABOLIC PANEL - Abnormal; Notable for the following components:      Result Value   Glucose, Bld 147 (*)    BUN 23 (*)    Alkaline Phosphatase 149 (*)    All other components within normal limits  CBC WITH DIFFERENTIAL/PLATELET  TSH    EKG  EKG Interpretation  Date/Time:  Saturday August 31 2017 21:21:54 EST Ventricular Rate:  149 PR Interval:    QRS Duration: 96 QT Interval:  295 QTC Calculation: 476 R Axis:   42 Text Interpretation:  Atrial fibrillation Borderline low voltage, extremity leads ST depression, probably rate related Confirmed by Fredia Sorrow [phone]) on 08/31/2017 9:29:43 PM       Radiology Dg Chest Port 1 View  Result Date: 08/31/2017 CLINICAL DATA:  Tachycardia. EXAM: PORTABLE CHEST 1 VIEW COMPARISON:  December 06, 2013 FINDINGS: The heart size and mediastinal contours are within normal limits. Both lungs are clear. The visualized skeletal structures are unremarkable. IMPRESSION: No active disease. Electronically Signed   By: Dorise Bullion III M.D   On: 08/31/2017 22:09     Procedures Procedures (including critical care time)  CRITICAL CARE Performed by: Fredia Sorrow Total critical care time: 30 minutes Critical care time was exclusive of separately billable procedures and treating other patients. Critical care was necessary to treat or prevent imminent or life-threatening deterioration. Critical care was time spent personally by me on the following activities: development of treatment plan with patient and/or surrogate as well as nursing, discussions with consultants, evaluation of patient's response to treatment, examination of patient, obtaining history from patient or surrogate, ordering and performing treatments and interventions, ordering and review of laboratory studies, ordering and review of radiographic studies, pulse oximetry and re-evaluation of patient's condition.  Medications Ordered in ED Medications  0.9 %  sodium chloride infusion ( Intravenous New Bag/Given 08/31/17 2212)  ondansetron (ZOFRAN) injection 4 mg (4 mg Intravenous Given 08/31/17 2155)  sodium chloride 0.9 % bolus 500 mL (0 mLs Intravenous Stopped 08/31/17 2211)  diltiazem (CARDIZEM)  injection 10 mg (10 mg Intravenous Given 08/31/17 2155)  diltiazem (CARDIZEM) 100 mg in dextrose 5% 124mL (1 mg/mL) infusion (5 mg/hr Intravenous New Bag/Given 08/31/17 2158)     Initial Impression / Assessment and Plan / ED Course  I have reviewed the triage vital signs and the nursing notes.  Pertinent labs & imaging results that were available during my care of the patient were reviewed by me and considered in my medical decision making (see chart for details).   Patient with new onset atrial fibrillation with RVR.  Sounds like things started around 1600 today.  Patient not on blood thinners.  No past history of this.  Not associated with shortness of breath or chest pain.  Other than the rapid heart rate vital signs are fine.  Actually a little hypertensive.  Patient's past medical history  significant for remote hyperthyroidism.  TS H ordered and pending.  Patient will require admission got diltiazem bolus with slowing heart rate down to around the 120s.  It was 160 prior to that.  Now started on drip.  Chest x-ray negative basic labs without significant abnormalities.  We will discussed with hospitalist regarding admission.  Primary care doctor is Dr. Willey Blade.  Patient feels better with heart rate slowed down to the 120s.    Final Clinical Impressions(s) / ED Diagnoses   Final diagnoses:  Atrial fibrillation with RVR [facility])    ED Discharge Orders    None       Fredia Sorrow, MD 08/31/17 2228

## 2017-09-01 DIAGNOSIS — I10 Essential (primary) hypertension: Secondary | ICD-10-CM

## 2017-09-01 DIAGNOSIS — I4891 Unspecified atrial fibrillation: Secondary | ICD-10-CM | POA: Diagnosis not present

## 2017-09-01 DIAGNOSIS — I160 Hypertensive urgency: Secondary | ICD-10-CM | POA: Diagnosis not present

## 2017-09-01 LAB — TROPONIN I

## 2017-09-01 LAB — TSH: TSH: 3.552 u[IU]/mL (ref 0.350–4.500)

## 2017-09-01 LAB — CBC
HCT: 37.1 % (ref 36.0–46.0)
Hemoglobin: 12.2 g/dL (ref 12.0–15.0)
MCH: 31.9 pg (ref 26.0–34.0)
MCHC: 32.9 g/dL (ref 30.0–36.0)
MCV: 96.9 fL (ref 78.0–100.0)
PLATELETS: 142 10*3/uL — AB (ref 150–400)
RBC: 3.83 MIL/uL — ABNORMAL LOW (ref 3.87–5.11)
RDW: 12.6 % (ref 11.5–15.5)
WBC: 6.8 10*3/uL (ref 4.0–10.5)

## 2017-09-01 LAB — BASIC METABOLIC PANEL
Anion gap: 5 (ref 5–15)
BUN: 24 mg/dL — AB (ref 6–20)
CALCIUM: 9.1 mg/dL (ref 8.9–10.3)
CO2: 29 mmol/L (ref 22–32)
CREATININE: 0.82 mg/dL (ref 0.44–1.00)
Chloride: 109 mmol/L (ref 101–111)
GFR calc Af Amer: >60 mL/min (ref >60)
Glucose, Bld: 101 mg/dL — ABNORMAL HIGH (ref 65–99)
Potassium: 4.2 mmol/L (ref 3.5–5.1)
Sodium: 143 mmol/L (ref 135–145)

## 2017-09-01 LAB — MRSA PCR SCREENING: MRSA BY PCR: NEGATIVE

## 2017-09-01 LAB — T4, FREE: FREE T4: 0.84 ng/dL (ref 0.61–1.12)

## 2017-09-01 MED ORDER — HEPARIN SODIUM (PORCINE) 5000 UNIT/ML IJ SOLN: 5000.0000 [IU] | Freq: Three times a day (TID) | INTRAMUSCULAR | Status: DC

## 2017-09-01 MED ORDER — DILTIAZEM HCL-DEXTROSE 100-5 MG/100ML-% IV SOLN (PREMIX)
5.0000 mg/h | INTRAVENOUS | Status: DC
Start: 2017-09-01 — End: 2017-09-01

## 2017-09-01 MED ORDER — METOPROLOL TARTRATE 25 MG PO TABS
25.0000 mg | ORAL_TABLET | Freq: Every day | ORAL | Status: DC
  Administered 2017-09-01 – 2017-09-02 (×2): 25 mg via ORAL
  Filled 2017-09-01 (×2): qty 1

## 2017-09-01 NOTE — Progress Notes (Signed)
PROGRESS NOTE    Melissa [name]  DJT:[id number] DOB: 28-Feb-1951 DOA: 08/31/2017 PCP: Asencion Noble, MD     Brief Narrative:  66 year old woman admitted from home on 12/1 due to palpitations.  She has a history of a left thalamic hemorrhagic infarct in 2015, hypothyroidism, hypertension.  Upon arrival she was found to have a blood pressure of 210/120 and an EKG consistent with A. fib with RVR at a rate of 149.   Assessment & Plan:   Principal Problem:   Atrial fibrillation with RVR (HCC) Active Problems:   Essential hypertension   Thalamic hemorrhage with stroke (HCC)   Hypertensive urgency   New onset A. fib with RVR. -He was initially placed on a Cardizem drip and admitted to the stepdown unit, she has subsequently been weaned off Cardizem. -Heart rates are currently in the 70s-80s and she has converted to normal sinus rhythm. -She is on metoprolol 12.5 mg daily which I will increased to 25 mg daily. -She would not be a good candidate for anticoagulation given her history of hemorrhagic CVA.  I have fully discussed this with patient and her husband at bedside.  Hypertensive urgency -Blood pressure 210/120 on arrival. -Blood pressures down to 150/60 as of this morning. -Metoprolol dose will be increased, has been weaned off Cardizem.   DVT prophylaxis: SCDs Code Status: Full code Family Communication: Patient only Disposition Plan: Transfer to floor, anticipate discharge home over the next 24 hours  Consultants:   None  Procedures:   None  Antimicrobials:  Anti-infectives (From admission, onward)   None       Subjective: Feels much improved, no further palpitations.  Objective: Vitals:   09/01/17 0900 09/01/17 0930 09/01/17 1000 09/01/17 1100  BP: (!) 152/61 (!) 162/66  (!) 155/61  Pulse: 61 63 (!) 59 (!) 51  Resp: 20 13 13    Temp:    98.9 F (37.2 C)  TempSrc:      SpO2: 100% 100% 99% 98%  Weight:      Height:        Intake/Output Summary (Last  24 hours) at 09/01/2017 1511 Last data filed at 09/01/2017 0500 Gross per 24 hour  Intake 532.34 ml  Output -  Net 532.34 ml   Filed Weights   08/31/17 2112 08/31/17 2323 09/01/17 0500  Weight: 77.1 kg (170 lb) 81 kg (178 lb 9.2 oz) 81 kg (178 lb 9.2 oz)    Examination:  General exam: Alert, awake, oriented x 3 Respiratory system: Clear to auscultation. Respiratory effort normal. Cardiovascular system:RRR. No murmurs, rubs, gallops. Gastrointestinal system: Abdomen is nondistended, soft and nontender. No organomegaly or masses felt. Normal bowel sounds heard. Central nervous system: Alert and oriented. No focal neurological deficits. Extremities: No C/C/E, +pedal pulses Skin: No rashes, lesions or ulcers Psychiatry: Judgement and insight appear normal. Mood & affect appropriate.     Data Reviewed: I have personally reviewed following labs and imaging studies  CBC: Recent Labs  Lab 08/31/17 2135 09/01/17 0421  WBC 6.1 6.8  NEUTROABS 3.8  --   HGB 13.5 12.2  HCT 41.0 37.1  MCV 96.0 96.9  PLT 156 300*   Basic Metabolic Panel: Recent Labs  Lab 08/31/17 2135 09/01/17 0421  NA 140 143  K 3.7 4.2  CL 104 109  CO2 27 29  GLUCOSE 147* 101*  BUN 23* 24*  CREATININE 0.89 0.82  CALCIUM 9.6 9.1   GFR: Estimated Creatinine Clearance: 74.3 mL/min (by C-G formula based on SCr of  0.82 mg/dL). Liver Function Tests: Recent Labs  Lab 08/31/17 2135  AST 29  ALT 24  ALKPHOS 149*  BILITOT 0.7  PROT 7.7  ALBUMIN 4.4   No results for input(s): LIPASE, AMYLASE in the last 168 hours. No results for input(s): AMMONIA in the last 168 hours. Coagulation Profile: No results for input(s): INR, PROTIME in the last 168 hours. Cardiac Enzymes: Recent Labs  Lab 08/31/17 2135 09/01/17 0421 09/01/17 1036  TROPONINI <0.03 <0.03 <0.03   BNP (last 3 results) No results for input(s): PROBNP in the last 8760 hours. HbA1C: No results for input(s): HGBA1C in the last 72  hours. CBG: No results for input(s): GLUCAP in the last 168 hours. Lipid Profile: No results for input(s): CHOL, HDL, LDLCALC, TRIG, CHOLHDL, LDLDIRECT in the last 72 hours. Thyroid Function Tests: Recent Labs    08/31/17 2135 09/01/17 0421  TSH  --  3.552  FREET4 0.84  --    Anemia Panel: No results for input(s): VITAMINB12, FOLATE, FERRITIN, TIBC, IRON, RETICCTPCT in the last 72 hours. Urine analysis:    Component Value Date/Time   COLORURINE RED (A) 04/02/2014 0310   APPEARANCEUR HAZY (A) 04/02/2014 0310   LABSPEC 1.010 04/02/2014 0310   PHURINE 6.0 04/02/2014 0310   GLUCOSEU NEGATIVE 04/02/2014 0310   HGBUR LARGE (A) 04/02/2014 0310   BILIRUBINUR NEGATIVE 04/02/2014 0310   KETONESUR NEGATIVE 04/02/2014 0310   PROTEINUR 100 (A) 04/02/2014 0310   UROBILINOGEN 0.2 04/02/2014 0310   NITRITE NEGATIVE 04/02/2014 0310   LEUKOCYTESUR MODERATE (A) 04/02/2014 0310   Sepsis Labs: @LABRCNTIP (procalcitonin:4,lacticidven:4)  ) Recent Results (from the past 240 hour(s))  MRSA PCR Screening     Status: None   Collection Time: 08/31/17 11:27 PM  Result Value Ref Range Status   MRSA by PCR NEGATIVE NEGATIVE Final    Comment:        The GeneXpert MRSA Assay (FDA approved for NASAL specimens only), is one component of a comprehensive MRSA colonization surveillance program. It is not intended to diagnose MRSA infection nor to guide or monitor treatment for MRSA infections.          Radiology Studies: Dg Chest Port 1 View  Result Date: 08/31/2017 CLINICAL DATA:  Tachycardia. EXAM: PORTABLE CHEST 1 VIEW COMPARISON:  December 06, 2013 FINDINGS: The heart size and mediastinal contours are within normal limits. Both lungs are clear. The visualized skeletal structures are unremarkable. IMPRESSION: No active disease. Electronically Signed   By: Dorise Bullion III M.D   On: 08/31/2017 22:09        Scheduled Meds: . amLODipine  5 mg Oral Daily  . aspirin EC  81 mg Oral Daily   . irbesartan  75 mg Oral QHS  . metoprolol tartrate  25 mg Oral Daily  . multivitamin with minerals  1 tablet Oral Daily   Continuous Infusions:   LOS: 0 days    Time spent: 25 minutes. Greater than 50% of this time was spent in direct contact with the patient coordinating care.     Lelon Frohlich, MD [facility] Pager [phone]  If 7PM-7AM, please contact night-coverage [email or web address] Password [email or web address] 09/01/2017, 3:11 PM

## 2017-09-01 NOTE — Progress Notes (Signed)
Patient is alert and oriented. Vital signs are stable. Telemetry reads sinus brady 50's. "this is normal pulse for me" per patient. Saline lock patent.  Patient Denies pain. Report called to Quillian Quince, RN and he verbalized understanding of report. Patient transferred to room 334 via wheelchair accompanied by nursing staff and family.

## 2017-09-01 NOTE — Progress Notes (Signed)
Received patient to room 334 from ICU, patient A&O x4, no acute distress noted, patient placed on tele monitor and noted to be in Sinus bradycardia -50"s. Family member at bedside.  Patient verbalized no needs at this time. Will continue to monitor patient.

## 2017-09-02 DIAGNOSIS — I4891 Unspecified atrial fibrillation: Secondary | ICD-10-CM | POA: Diagnosis not present

## 2017-09-02 MED ORDER — METOPROLOL TARTRATE 25 MG PO TABS: 12.5000 mg | ORAL_TABLET | Freq: Two times a day (BID) | ORAL | 2 refills | Status: DC

## 2017-09-02 NOTE — Progress Notes (Signed)
IV removed, 2x2 gauze and paper tape applied to site, patient tolerated well. Discharge information reviewed with patient who verbalized understanding. Patient's husband to transport patient home.

## 2017-09-02 NOTE — Discharge Summary (Signed)
Physician Discharge Summary  LOVELLA HARDIE EUM:[id number] DOB: 1951-06-05 DOA: 08/31/2017  PCP: Asencion Noble, MD  Admit date: 08/31/2017 Discharge date: 09/02/2017  Time spent: 45 minutes  Recommendations for Outpatient Follow-up:  -Will be discharged home today. -Advised to follow up with PCP and cardiology in 2 weeks.   Discharge Diagnoses:  Principal Problem:   Atrial fibrillation with RVR ([place]) Active Problems:   Essential hypertension   Thalamic hemorrhage with stroke [facility])   Hypertensive urgency   Discharge Condition: Stable and improved  Filed Weights   08/31/17 2323 09/01/17 0500 09/02/17 0517  Weight: 81 kg (178 lb 9.2 oz) 81 kg (178 lb 9.2 oz) 79 kg (174 lb 1.6 oz)    History of present illness:  As per Dr. Myna Hidalgo on 12/1: Melissa [name] is a 66 y.o. female with medical history significant for left thalamic hemorrhagic infarct in 2015, remote hypothyroidism, and hypertension, presented to the emergency department for evaluation of palpitations.  Patient reports that she been in her usual state of health and was having an uneventful day until this evening when she noted palpitations.  She checked her heart rate at home, noted that it was 153, and also notes that she was hypertensive with systolic pressure in the 540G at that time.  Symptoms persisted and she eventually came to the ED for evaluation.  She denies any chest pain or dyspnea.  She has never experienced similar symptoms previously.  ED Course: Upon arrival to the ED, patient is found to be afebrile, saturating well on room air, tachycardic to 149, and blood pressure of 210/120.  EKG features atrial fibrillation with RVR, rate 149.  Chest x-rays negative for acute cardiopulmonary disease.  Chemistry panel is unremarkable and CBC is within normal limits.  Patient was given 500 cc normal saline and 10 mg IV diltiazem in the heart rate improved to the 120s after the diltiazem IVP and she was started on diltiazem  infusion his blood pressures remained stable and the patient acute respiratory distress.  She will be  observed in the stepdown unit for ongoing evaluation and management of new onset atrial fibrillation with RVR.     Hospital Course:   New onset A. fib with RVR. -She was initially placed on a Cardizem drip and admitted to the stepdown unit, she has subsequently been weaned off Cardizem. -She has converted to NSR. -She is on metoprolol 12.5 mg daily which I have increased to 25 mg daily. HRs 60-80 past 12 hours on telemetry review. -She would not be a good candidate for anticoagulation given her history of hemorrhagic CVA.  I have fully discussed this with patient and her husband at bedside. They understand increased risk of ischemic CVA. -OP follow up with cardiology will be arranged.  Hypertensive urgency -Blood pressure 210/120 on arrival. -Blood pressures down to 150-160/60-70 on DC. -On increased metoprolol dose. -Follow BP as an OP for further medication needs.    Procedures:  None   Consultations:  None  Discharge Instructions  Discharge Instructions    Diet - low sodium heart healthy   Complete by:  As directed    Increase activity slowly   Complete by:  As directed      Allergies as of 09/02/2017   No Known Allergies     Medication List    TAKE these medications   acetaminophen 500 MG tablet Commonly known as:  TYLENOL Take 500 mg by mouth daily.   amLODipine 5 MG tablet  Commonly known as:  NORVASC Take 5 mg by mouth every morning.   aspirin EC 81 MG tablet Take 81 mg by mouth daily.   irbesartan 75 MG tablet Commonly known as:  AVAPRO Take 75 mg by mouth at bedtime.   metoprolol tartrate 25 MG tablet Commonly known as:  LOPRESSOR Take 0.5 tablets (12.5 mg total) by mouth 2 (two) times daily. What changed:    when to take this  additional instructions   multivitamin with minerals tablet Take 1 tablet by mouth daily.      No Known  Allergies Follow-up Information    Asencion Noble, MD. Schedule an appointment as soon as possible for a visit on 09/11/2017.   Specialty:  Internal Medicine Why:  3:45pm Contact information: [address] [place] [address] [phone]        Herminio Commons, MD. Schedule an appointment as soon as possible for a visit in 2 week(s).   Specialty:  Cardiology Contact information: [place] [address] [phone]            The results of significant diagnostics from this hospitalization (including imaging, microbiology, ancillary and laboratory) are listed below for reference.    Significant Diagnostic Studies: Dg Chest Port 1 View  Result Date: 08/31/2017 CLINICAL DATA:  Tachycardia. EXAM: PORTABLE CHEST 1 VIEW COMPARISON:  December 06, 2013 FINDINGS: The heart size and mediastinal contours are within normal limits. Both lungs are clear. The visualized skeletal structures are unremarkable. IMPRESSION: No active disease. Electronically Signed   By: Dorise Bullion III M.D   On: 08/31/2017 22:09    Microbiology: Recent Results (from the past 240 hour(s))  MRSA PCR Screening     Status: None   Collection Time: 08/31/17 11:27 PM  Result Value Ref Range Status   MRSA by PCR NEGATIVE NEGATIVE Final    Comment:        The GeneXpert MRSA Assay (FDA approved for NASAL specimens only), is one component of a comprehensive MRSA colonization surveillance program. It is not intended to diagnose MRSA infection nor to guide or monitor treatment for MRSA infections.      Labs: Basic Metabolic Panel: Recent Labs  Lab 08/31/17 2135 09/01/17 0421  NA 140 143  K 3.7 4.2  CL 104 109  CO2 27 29  GLUCOSE 147* 101*  BUN 23* 24*  CREATININE 0.89 0.82  CALCIUM 9.6 9.1   Liver Function Tests: Recent Labs  Lab 08/31/17 2135  AST 29  ALT 24  ALKPHOS 149*  BILITOT 0.7  PROT 7.7  ALBUMIN 4.4   No results for input(s): LIPASE, AMYLASE in the last  168 hours. No results for input(s): AMMONIA in the last 168 hours. CBC: Recent Labs  Lab 08/31/17 2135 09/01/17 0421  WBC 6.1 6.8  NEUTROABS 3.8  --   HGB 13.5 12.2  HCT 41.0 37.1  MCV 96.0 96.9  PLT 156 142*   Cardiac Enzymes: Recent Labs  Lab 08/31/17 2135 09/01/17 0421 09/01/17 1036  TROPONINI <0.03 <0.03 <0.03   BNP: BNP (last 3 results) No results for input(s): BNP in the last 8760 hours.  ProBNP (last 3 results) No results for input(s): PROBNP in the last 8760 hours.  CBG: No results for input(s): GLUCAP in the last 168 hours.     Signed:  Lelon Frohlich  [facility] Pager: [phone] 09/02/2017, 4:48 PM

## 2017-09-04 ENCOUNTER — Other Ambulatory Visit (HOSPITAL_COMMUNITY): Payer: Self-pay | Admitting: Internal Medicine

## 2017-09-04 DIAGNOSIS — I4891 Unspecified atrial fibrillation: Secondary | ICD-10-CM

## 2017-09-05 ENCOUNTER — Ambulatory Visit (HOSPITAL_COMMUNITY)
Admission: RE | Admit: 2017-09-05 | Discharge: 2017-09-05 | Disposition: A | Discharge status: Home / Self Care | Payer: Medicare Other | Source: Ambulatory Visit | Attending: Internal Medicine | Admitting: Internal Medicine

## 2017-09-05 DIAGNOSIS — I4891 Unspecified atrial fibrillation: Secondary | ICD-10-CM

## 2017-09-05 DIAGNOSIS — I081 Rheumatic disorders of both mitral and tricuspid valves: Secondary | ICD-10-CM | POA: Diagnosis not present

## 2017-09-05 DIAGNOSIS — I420 Dilated cardiomyopathy: Secondary | ICD-10-CM | POA: Insufficient documentation

## 2017-09-05 NOTE — Progress Notes (Signed)
*  PRELIMINARY RESULTS* Echocardiogram 2D Echocardiogram has been performed.  [name] 09/05/2017, 11:00 AM

## 2017-09-06 DIAGNOSIS — I10 Essential (primary) hypertension: Secondary | ICD-10-CM | POA: Diagnosis not present

## 2017-09-06 DIAGNOSIS — I480 Paroxysmal atrial fibrillation: Secondary | ICD-10-CM | POA: Diagnosis not present

## 2017-09-10 ENCOUNTER — Encounter (HOSPITAL_COMMUNITY): Payer: Self-pay

## 2017-09-10 ENCOUNTER — Emergency Department (HOSPITAL_COMMUNITY)
Admission: EM | Admit: 2017-09-10 | Discharge: 2017-09-10 | Disposition: A | Discharge status: Home / Self Care | Payer: Medicare Other | Attending: Emergency Medicine | Admitting: Emergency Medicine

## 2017-09-10 ENCOUNTER — Other Ambulatory Visit: Payer: Self-pay

## 2017-09-10 DIAGNOSIS — E876 Hypokalemia: Secondary | ICD-10-CM | POA: Diagnosis not present

## 2017-09-10 DIAGNOSIS — R002 Palpitations: Secondary | ICD-10-CM | POA: Diagnosis not present

## 2017-09-10 DIAGNOSIS — Z79899 Other long term (current) drug therapy: Secondary | ICD-10-CM | POA: Diagnosis not present

## 2017-09-10 DIAGNOSIS — I10 Essential (primary) hypertension: Secondary | ICD-10-CM | POA: Insufficient documentation

## 2017-09-10 DIAGNOSIS — E0590 Thyrotoxicosis, unspecified without thyrotoxic crisis or storm: Secondary | ICD-10-CM | POA: Insufficient documentation

## 2017-09-10 DIAGNOSIS — Z8673 Personal history of transient ischemic attack (TIA), and cerebral infarction without residual deficits: Secondary | ICD-10-CM | POA: Diagnosis not present

## 2017-09-10 DIAGNOSIS — I4891 Unspecified atrial fibrillation: Secondary | ICD-10-CM | POA: Insufficient documentation

## 2017-09-10 LAB — CBC WITH DIFFERENTIAL/PLATELET
Basophils Absolute: 0.1 10*3/uL (ref 0.0–0.1)
Basophils Relative: 1 %
Eosinophils Absolute: 0.1 10*3/uL (ref 0.0–0.7)
Eosinophils Relative: 2 %
HEMATOCRIT: 41.1 % (ref 36.0–46.0)
HEMOGLOBIN: 13.9 g/dL (ref 12.0–15.0)
LYMPHS ABS: 1.6 10*3/uL (ref 0.7–4.0)
LYMPHS PCT: 25 %
MCH: 32.3 pg (ref 26.0–34.0)
MCHC: 33.8 g/dL (ref 30.0–36.0)
MCV: 95.6 fL (ref 78.0–100.0)
MONOS PCT: 10 %
Monocytes Absolute: 0.7 10*3/uL (ref 0.1–1.0)
NEUTROS ABS: 4 10*3/uL (ref 1.7–7.7)
NEUTROS PCT: 62 %
Platelets: 175 10*3/uL (ref 150–400)
RBC: 4.3 MIL/uL (ref 3.87–5.11)
RDW: 12.3 % (ref 11.5–15.5)
WBC: 6.4 10*3/uL (ref 4.0–10.5)

## 2017-09-10 LAB — COMPREHENSIVE METABOLIC PANEL
ALBUMIN: 4.4 g/dL (ref 3.5–5.0)
ALK PHOS: 118 U/L (ref 38–126)
ALT: 18 U/L (ref 14–54)
ANION GAP: 10 (ref 5–15)
AST: 23 U/L (ref 15–41)
BUN: 11 mg/dL (ref 6–20)
CHLORIDE: 102 mmol/L (ref 101–111)
CO2: 27 mmol/L (ref 22–32)
Calcium: 10 mg/dL (ref 8.9–10.3)
Creatinine, Ser: 0.83 mg/dL (ref 0.44–1.00)
GFR calc Af Amer: >60 mL/min (ref >60)
GFR calc non Af Amer: >60 mL/min (ref >60)
Glucose, Bld: 103 mg/dL — ABNORMAL HIGH (ref 65–99)
Potassium: 3.4 mmol/L — ABNORMAL LOW (ref 3.5–5.1)
SODIUM: 139 mmol/L (ref 135–145)
Total Bilirubin: 1.1 mg/dL (ref 0.3–1.2)
Total Protein: 7.8 g/dL (ref 6.5–8.1)

## 2017-09-10 LAB — TROPONIN I: Troponin I: <0.03 ng/mL (ref <0.03)

## 2017-09-10 LAB — MAGNESIUM: MAGNESIUM: 1.8 mg/dL (ref 1.7–2.4)

## 2017-09-10 MED ORDER — POTASSIUM CHLORIDE CRYS ER 20 MEQ PO TBCR
40.0000 meq | EXTENDED_RELEASE_TABLET | Freq: Once | ORAL | Status: AC
  Administered 2017-09-10: 40 meq via ORAL
  Filled 2017-09-10: qty 2

## 2017-09-10 NOTE — Discharge Instructions (Signed)
If you feel like your heart if racing again and lasting a few minutes, take an extra 1/2 tablet of your metoprolol. Proceed to the ED if you feel dizzy, get chest pain, shortness of breath or feel light headed, or if your heart is still racing after 30-60 minutes of taking the metoprolol.  Keep your appointment with Dr. Willey Blade on the 14th and keep your cardiology appointment next week.

## 2017-09-10 NOTE — ED Provider Notes (Signed)
[facility] EMERGENCY DEPARTMENT Provider Note   CSN: [id number] Arrival date & time: 09/10/17  0531  Time seen 5:44 AM   History   Chief Complaint Chief Complaint  Patient presents with  . Atrial Fibrillation    HPI Melissa [name] is a 66 y.o. female.  HPI patient states she had her first episode of atrial fibrillation on December 1.  At that time it lasted a couple of hours.  Her thyroid tests, TSH and free T4 were normal on December 2 and first.  She states today about 5 AM she woke up and felt like her heart was beating fast.  She states when she checked it it was over 100.  She states it lasted about 30 minutes and this resolved while driving to the emergency department.  She denies having chest pain, shortness of breath, diaphoresis, nausea, or vomiting during the episode.  She states she currently just feels shaky.  She was seen by her PCP on December 7 and he increased her metoprolol from 12.5 mg once a day to 12.5 mg twice a day because her blood pressure was high.  He also increased her irbesartan from 75 mg a day to 150 mg a day.  She states they are trying to avoid putting her on Coumadin because she has had a bleeding stroke before ( thalamic hemorrhagic stroke in 2015).  She states that she was diagnosed with atrial fibrillation her blood pressure has been high overall but goes up and down.  She has a follow-up appointment with her PCP on December 14.  Her first appointment with cardiology is on December 18.   PCP Asencion Noble, MD   Past Medical History:  Diagnosis Date  . 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 Hypertension   . Hyperthyroidism    remote  . Uterine cancer [facility])     Patient Active Problem List   Diagnosis Date Noted  . Atrial fibrillation with RVR ([place]) 08/31/2017  . Hypertensive urgency 08/31/2017  . Cognitive deficits, late effect of cerebrovascular disease 05/24/2014  . Abdominal pain, unspecified site 04/26/2014  . Hyperglycemia  02/17/2014  . Severe obesity (BMI >= 40) ([place]) 01/28/2014  . Aphasia due to recent cerebrovascular accident 01/07/2014  . Thalamic hemorrhage with stroke ([place]) 12/02/2013  . ADENOCARCINOMA, ENDOMETRIUM 08/11/2007  . HYPERTHYROIDISM 05/16/2007  . Essential hypertension 05/16/2007  . OSTEOARTHRITIS 05/16/2007    Past Surgical History:  Procedure Laterality Date  . ABDOMINAL HYSTERECTOMY    . CHOLECYSTECTOMY    . COLONOSCOPY N/A 05/12/2014   Procedure: COLONOSCOPY;  Surgeon: Daneil Dolin, MD;  Location: [facility] ENDO SU[facility];  Service: Endoscopy;  Laterality: N/A;  1130  . COLONOSCOPY N/A 07/26/2017   Procedure: COLONOSCOPY;  Surgeon: Daneil Dolin, MD;  Location: [facility] ENDO SUITE;  Service: Endoscopy;  Laterality: N/A;  1:00 pm  . REPLACEMENT TOTAL KNEE BILATERAL     bilateral knees replaced twice    OB History    No data available       Home Medications    Prior to Admission medications   Medication Sig Start Date End Date Taking? Authorizing Provider  acetaminophen (TYLENOL) 500 MG tablet Take 500 mg by mouth daily.     [provider]  amLODipine (NORVASC) 5 MG tablet Take 5 mg by mouth every morning.    [provider]  aspirin EC 81 MG tablet Take 81 mg by mouth daily.    [provider]  irbesartan (AVAPRO) 75  MG tablet Take 75 mg by mouth at bedtime.    [provider]  metoprolol tartrate (LOPRESSOR) 25 MG tablet Take 0.5 tablets (12.5 mg total) by mouth 2 (two) times daily. 09/02/17   Isaac Bliss, Rayford Halsted, MD  Multiple Vitamins-Minerals (MULTIVITAMIN WITH MINERALS) tablet Take 1 tablet by mouth daily.    [provider]    Family History Family History  Problem Relation Age of Onset  . Colon cancer Neg Hx     Social History Social History   Tobacco Use  . Smoking status: Never Smoker  . Smokeless tobacco: Never Used  . Tobacco comment: Never smoked  Substance Use Topics  . Alcohol use: No    Alcohol/week: 0.0 oz    . Drug use: No  lives at home  Lives with spouse   Allergies   Patient has no known allergies.   Review of Systems Review of Systems  All other systems reviewed and are negative.    Physical Exam Updated Vital Signs BP (!) 194/78 (BP Location: Left Arm)   Pulse 75   Temp 97.7 F (36.5 C) (Oral)   Resp 18   Ht 5\' 8"  (1.727 m)   Wt 77.1 kg (170 lb)   SpO2 100%   BMI 25.85 kg/m   Vital signs normal except hypertension   Physical Exam  Constitutional: She is oriented to person, place, and time. She appears well-developed and well-nourished.  Non-toxic appearance. She does not appear ill. No distress.  HENT:  Head: Normocephalic and atraumatic.  Right Ear: External ear normal.  Left Ear: External ear normal.  Nose: Nose normal. No mucosal edema or rhinorrhea.  Mouth/Throat: Oropharynx is clear and moist and mucous membranes are normal. No dental abscesses or uvula swelling.  Eyes: Conjunctivae and EOM are normal. Pupils are equal, round, and reactive to light.  Neck: Normal range of motion and full passive range of motion without pain. Neck supple.  Cardiovascular: Normal rate, regular rhythm and normal heart sounds. Exam reveals no gallop and no friction rub.  No murmur heard. Pulmonary/Chest: Effort normal and breath sounds normal. No respiratory distress. She has no wheezes. She has no rhonchi. She has no rales. She exhibits no tenderness and no crepitus.  Abdominal: Soft. Normal appearance and bowel sounds are normal. She exhibits no distension. There is no tenderness. There is no rebound and no guarding.  Musculoskeletal: Normal range of motion. She exhibits no edema or tenderness.  Moves all extremities well.   Neurological: She is alert and oriented to person, place, and time. She has normal strength. No cranial nerve deficit.  Skin: Skin is warm, dry and intact. No rash noted. No erythema. No pallor.  Psychiatric: Her speech is normal and behavior is normal. Her  mood appears anxious.  Nursing note and vitals reviewed.    ED Treatments / Results  Labs (all labs ordered are listed, but only abnormal results are displayed) Results for orders placed or performed during the hospital encounter of 09/10/17  Comprehensive metabolic panel  Result Value Ref Range   Sodium 139 135 - 145 mmol/L   Potassium 3.4 (L) 3.5 - 5.1 mmol/L   Chloride 102 101 - 111 mmol/L   CO2 27 22 - 32 mmol/L   Glucose, Bld 103 (H) 65 - 99 mg/dL   BUN 11 6 - 20 mg/dL   Creatinine, Ser 0.83 0.44 - 1.00 mg/dL   Calcium 10.0 8.9 - 10.3 mg/dL   Total Protein 7.8 6.5 - 8.1  g/dL   Albumin 4.4 3.5 - 5.0 g/dL   AST 23 15 - 41 U/L   ALT 18 14 - 54 U/L   Alkaline Phosphatase 118 38 - 126 U/L   Total Bilirubin 1.1 0.3 - 1.2 mg/dL   GFR calc non Af Amer >60 >60 mL/min   GFR calc Af Amer >60 >60 mL/min   Anion gap 10 5 - 15  CBC with Differential  Result Value Ref Range   WBC 6.4 4.0 - 10.5 K/uL   RBC 4.30 3.87 - 5.11 MIL/uL   Hemoglobin 13.9 12.0 - 15.0 g/dL   HCT 41.1 36.0 - 46.0 %   MCV 95.6 78.0 - 100.0 fL   MCH 32.3 26.0 - 34.0 pg   MCHC 33.8 30.0 - 36.0 g/dL   RDW 12.3 11.5 - 15.5 %   Platelets 175 150 - 400 K/uL   Neutrophils Relative % 62 %   Neutro Abs 4.0 1.7 - 7.7 K/uL   Lymphocytes Relative 25 %   Lymphs Abs 1.6 0.7 - 4.0 K/uL   Monocytes Relative 10 %   Monocytes Absolute 0.7 0.1 - 1.0 K/uL   Eosinophils Relative 2 %   Eosinophils Absolute 0.1 0.0 - 0.7 K/uL   Basophils Relative 1 %   Basophils Absolute 0.1 0.0 - 0.1 K/uL  Troponin I  Result Value Ref Range   Troponin I <0.03 <0.03 ng/mL  Magnesium  Result Value Ref Range   Magnesium 1.8 1.7 - 2.4 mg/dL   Laboratory interpretation all normal except mild hypokalemia    EKG  EKG Interpretation  Date/Time:  Tuesday September 10 2017 05:40:38 EST Ventricular Rate:  79 PR Interval:    QRS Duration: 137 QT Interval:  379 QTC Calculation: 435 R Axis:   47 Text Interpretation:  Normal sinus rhythm U  waves present Probable left ventricular hypertrophy Baseline wander in lead(s) V5 Since last tracing 31 Aug 2017 Normal sinus rhythm has replaced Atrial fibrillation Confirmed by Rolland Porter [phone]) on 09/10/2017 5:46:30 AM       Radiology No results found.  Procedures Procedures (including critical care time)  Sep 05, 2017  Study Conclusions  - Left ventricle: The cavity size was normal. Wall thickness was   normal. Systolic function was normal. The estimated ejection   fraction was in the range of 60% to 65%. Wall motion was normal;   there were no regional wall motion abnormalities. The study is   not technically sufficient to allow evaluation of LV diastolic   function. A comprehensive diastolic assessment was not obtained. - Mitral valve: There was mild regurgitation. - Left atrium: The atrium was mildly dilated. - Right atrium: The atrium was mildly dilated. - Atrial septum: No defect or patent foramen ovale was identified. - Tricuspid valve: There was moderate eccentric regurgitation. - Pulmonary arteries: PA peak pressure: 43 mm Hg (S).   Medications Ordered in ED Medications  potassium chloride SA (K-DUR,KLOR-CON) CR tablet 40 mEq (40 mEq Oral Given 09/10/17 0640)     Initial Impression / Assessment and Plan / ED Course  I have reviewed the triage vital signs and the nursing notes.  Pertinent labs & imaging results that were available during my care of the patient were reviewed by me and considered in my medical decision making (see chart for details).     When I review her chart she had already been on metoprolol 12.5 mg a day prior to her admission for the atrial fibrillation and according to  that note she was increased to 25 mg a day, with instructions to take 12.5 mg twice a day.  Her blood pressure was 210/120 when she arrived to the ED on December 1, at time of discharge her blood pressure was in the 150-160/60-70 range.  She does take a baby aspirin once a  day.  Laboratory testing was done including a calcium and magnesium to look for electrolyte abnormality.  Patient was monitored during her ED visit.  Her initial blood pressure was high in the 190 range.  However both patient and her husband seem very anxious.  Patient's blood pressure improved to 173/73 which is about what her blood pressures were when she was discharged from the hospital.  Her heart rate remains in the 60s.  We discussed she could take an extra half of her metoprolol if she starts getting the palpitations again and they are lasting more than a few minutes.  She can proceed to the emergency department if it does not control her heart rate within the hour or she should come to the emergency department if she has symptoms such as lightheadedness or chest pain or shortness of breath.  Patient has asked me several times that she wants a "cure" for the atrial fibrillation.  She seems to think there is a pill that will make it go away.  Final Clinical Impressions(s) / ED Diagnoses   Final diagnoses:  Palpitations  Hypokalemia    ED Discharge Orders    None      Plan discharge  Rolland Porter, MD, Barbette Or, MD 09/10/17 [phone]

## 2017-09-10 NOTE — ED Triage Notes (Signed)
Pt awoke from sleep feeling her heart beating abnormally fast as if it were beating out her chest.

## 2017-09-13 DIAGNOSIS — I480 Paroxysmal atrial fibrillation: Secondary | ICD-10-CM | POA: Diagnosis not present

## 2017-09-13 DIAGNOSIS — I10 Essential (primary) hypertension: Secondary | ICD-10-CM | POA: Diagnosis not present

## 2017-09-16 NOTE — Progress Notes (Signed)
Cardiology Office Note    Date:  09/17/2017   ID:  Melissa, [name] March 23, 1951, MRN [id number]  PCP:  Asencion Noble, MD  Cardiologist: New to [facility] - Dr. Bronson Ing (contacted about the patient during her recent hospitalization)  Chief Complaint  Patient presents with  . New Patient (Initial Visit)    New-onset atrial fibrillation     History of Present Illness:    Melissa [name] is a 66 y.o. female with HTN, Hyperthyroidism, and prior hemorrhagic CVA (occurring in 2015) who presents to the office today as a new patient referral from [facility] ED for evaluation of atrial fibrillation.   She was recently admitted to [facility] from 12/1-12/11/2016 for evaluation of new onset palpitations which started the day of her arrival. Her heart rate was elevated into the 150's and SBP was in the 180's at that time. Upon arrival to the ED, she was found to be in atrial fibrillation with RVR and was started on IV Cardizem for rate control. She experienced spontaneous conversion to normal sinus rhythm and was switched to Lopressor 12.5mg  BID. Thyroid panel showed no acute abnormalities and cyclic troponin values remained negative. She was not started on anticoagulation given her history of his hemorrhagic CVA.  An echocardiogram was obtained in the outpatient setting and showed a preserved EF of 60-65% with no regional WMA, mild MR, and moderate TR.   She presented back to [facility] ED on 09/10/2017 for recurrent palpitations which lasted for a 30 minute interval. She denied any recurrent symptoms while in the ED and her EKG showed she was maintaining NSR. K+ was slightly low at 3.4 and Mg was stable at 1.8. She was discharged and informed to follow-up with Cardiology in the outpatient setting.   In talking with the patient today, she reports being very anxious about the chance that she might go back in atrial fibrillation and has been checking her HR and BP 10+ times per day. She is accompanied  by her husband who feels that her anxiety is a significant contributing factor to her recurrent palpitations. She has been monitoring her heart rate and blood pressure at home and heart rate has been well controlled in the 50's-60's. Last night, she experienced palpitations and took an extra Lopressor 12.5 mg and her symptoms resolved within 30 minutes. She denies any associated symptoms at that time and heart rate was in the 60's then.   She is unaware of any prior history of CAD or cardiac arrhythmias (prior to recent atrial fibrillation diagnosis). Reports having known hypertension which is followed by her PCP. Reports her Avapro was recently increased from 150 mg daily to 300 mg daily last week. Prior to her hospitalization, she was exercising for 1-2 hours at the gym without any anginal symptoms.   She does have a family history of hypertension and her maternal grandfather had a CVA in his 42's. No known family history of CAD or cardiac arrhythmias. She does not consume caffeine or alcohol. No prior tobacco use or recreational drug use.  Past Medical History:  Diagnosis Date  . 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 Hypertension   . Hyperthyroidism    remote  . Uterine cancer [facility])     Past Surgical History:  Procedure Laterality Date  . ABDOMINAL HYSTERECTOMY    . CHOLECYSTECTOMY    . COLONOSCOPY N/A 05/12/2014   Procedure: COLONOSCOPY;  Surgeon: Daneil Dolin, MD;  [facility];  Service: Endoscopy;  Laterality: N/A;  1130  . COLONOSCOPY N/A 07/26/2017   Procedure: COLONOSCOPY;  Surgeon: Daneil Dolin, MD;  Location: [facility] ENDO [facility];  Service: Endoscopy;  Laterality: N/A;  1:00 pm  . REPLACEMENT TOTAL KNEE BILATERAL     bilateral knees replaced twice    Current Medications: Outpatient Medications Prior to Visit  Medication Sig Dispense Refill  . acetaminophen (TYLENOL) 500 MG tablet Take 500 mg by mouth as needed.     Marland Kitchen amLODipine (NORVASC) 5 MG tablet Take 5  mg by mouth every morning.    Marland Kitchen aspirin EC 81 MG tablet Take 81 mg by mouth daily.    . irbesartan (AVAPRO) 300 MG tablet Take 300 mg by mouth at bedtime.     . metoprolol tartrate (LOPRESSOR) 25 MG tablet Take 0.5 tablets (12.5 mg total) by mouth 2 (two) times daily. 60 tablet 2  . Multiple Vitamins-Minerals (MULTIVITAMIN WITH MINERALS) tablet Take 1 tablet by mouth daily.    . irbesartan (AVAPRO) 75 MG tablet Take 75 mg by mouth at bedtime.     No facility-administered medications prior to visit.      Allergies:   Patient has no known allergies.   Social History   Socioeconomic History  . Marital status: Married    Spouse name: None  . Number of children: 1  . Years of education: college  . Highest education level: None  Social Needs  . Financial resource strain: None  . Food insecurity - worry: None  . Food insecurity - inability: None  . Transportation needs - medical: None  . Transportation needs - non-medical: None  Occupational History  . Occupation: s    Employer: Therapist, music  Tobacco Use  . Smoking status: Never Smoker  . Smokeless tobacco: Never Used  . Tobacco comment: Never smoked  Substance and Sexual Activity  . Alcohol use: No    Alcohol/week: 0.0 oz  . Drug use: No  . Sexual activity: None  Other Topics Concern  . None  Social History Narrative   Patient lives at home with her husband.   Patient drinks soda and coffee diet.     Family History:  The patient's family history includes CVA (age of onset: 48) in her maternal grandfather.   Review of Systems:   Please see the history of present illness.     General:  No chills, fever, night sweats or weight changes.  Cardiovascular:  No chest pain, dyspnea on exertion, edema, orthopnea, paroxysmal nocturnal dyspnea. Positive for palpitations.  Dermatological: No rash, lesions/masses Respiratory: No cough, dyspnea Urologic: No hematuria, dysuria Abdominal:   No nausea, vomiting, diarrhea, bright  red blood per rectum, melena, or hematemesis Neurologic:  No visual changes, wkns, changes in mental status.  All other systems reviewed and are otherwise negative except as noted above.   Physical Exam:    VS:  BP (!) 168/80 (BP Location: Left Arm)   Pulse 84   Ht 5' 7.5" (1.715 m)   Wt 173 lb (78.5 kg)   SpO2 99%   BMI 26.70 kg/m    General: Well developed, well nourished Caucasian female appearing in no acute distress. Head: Normocephalic, atraumatic, sclera non-icteric, no xanthomas, nares are without discharge.  Neck: No carotid bruits. JVD not elevated.  Lungs: Respirations regular and unlabored, without wheezes or rales.  Heart: Regular rate and rhythm. No S3 or S4.  No murmur, no rubs, or gallops appreciated. Abdomen: Soft, non-tender, non-distended with normoactive bowel sounds.  No hepatomegaly. No rebound/guarding. No obvious abdominal masses. Msk:  Strength and tone appear normal for age. No joint deformities or effusions. Extremities: No clubbing or cyanosis. No lower extremity edema.  Distal pedal pulses are 2+ bilaterally. Neuro: Alert and oriented X 3. Moves all extremities spontaneously. No focal deficits noted. Psych:  Responds to questions appropriately with a normal affect. Skin: No rashes or lesions noted  Wt Readings from Last 3 Encounters:  09/17/17 173 lb (78.5 kg)  09/10/17 170 lb (77.1 kg)  09/02/17 174 lb 1.6 oz (79 kg)     Studies/Labs Reviewed:   EKG:  EKG is ordered today.  The ekg ordered today demonstrates NSR, HR 79, with no acute ST or T-wave changes when compared to prior tracings.   Recent Labs: 09/01/2017: TSH 3.552 09/10/2017: ALT 18; BUN 11; Creatinine, Ser 0.83; Hemoglobin 13.9; Magnesium 1.8; Platelets 175; Potassium 3.4; Sodium 139   Lipid Panel    Component Value Date/Time   CHOL 126 04/12/2014 0945   TRIG 121.0 04/12/2014 0945   HDL 35.00 (L) 04/12/2014 0945   CHOLHDL 4 04/12/2014 0945   VLDL 24.2 04/12/2014 0945   LDLCALC 67  04/12/2014 0945    Additional studies/ records that were reviewed today include:   Echocardiogram: 09/05/2017 Study Conclusions  - Left ventricle: The cavity size was normal. Wall thickness was   normal. Systolic function was normal. The estimated ejection   fraction was in the range of 60% to 65%. Wall motion was normal;   there were no regional wall motion abnormalities. The study is   not technically sufficient to allow evaluation of LV diastolic   function. A comprehensive diastolic assessment was not obtained. - Mitral valve: There was mild regurgitation. - Left atrium: The atrium was mildly dilated. - Right atrium: The atrium was mildly dilated. - Atrial septum: No defect or patent foramen ovale was identified. - Tricuspid valve: There was moderate eccentric regurgitation. - Pulmonary arteries: PA peak pressure: 43 mm Hg (S).  Assessment:    1. Paroxysmal atrial fibrillation (HCC)   2. Palpitations   3. Essential hypertension   4. 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 Paroxysmal Atrial Fibrillation/ Palpitations - she was recently admitted for evaluation of palpitations, found to be in atrial fibrillation with RVR. Experienced conversion to NSR with IV Cardizem. Thyroid panel was WNL and cyclic troponin values remained negative. Outpatient echocardiogram showed a preserved EF of 60-65% with no regional WMA, mild MR, and moderate TR.  - she presented back to the ED for recurrent symptoms but was found to be in NSR. She reports episodes of palpitations since but HR is in the 50's to 60's when this occurs which makes recurrent atrial fibrillation with RVR less likely.  -We spent a significant amount of time reviewing the pathophysiology of atrial fibrillation and different treatment options today as the patient is clearly anxious about her recent diagnosis. Will continue with Lopressor 12.5 mg twice daily as her heart rate is  in the 50's at baseline and does not allow for further titration of her AV nodal blocking agent.  I have informed the patient she can take an extra tablet as needed for recurrent palpitations. Antiarrhythmic options limited as she is not on anticoagulation.  - she has not yet returned to exercise as she is concerned this will induce her arrhythmia. Will obtain an ETT to assess for ischemic and to rule-out  recurrent arrhythmias. If she develops recurrent persistent palpitations, would place a 30-day cardiac event monitor to assess AF burden.  - This patients CHA2DS2-VASc Score and unadjusted Ischemic Stroke Rate (% per year) is equal to 9.7 % stroke rate/year from a score of 6 (HTN, Female, Age (2), CVA (2)).  She has not been started on anticoagulation due to a history of hemorrhagic CVA.  She is aware of the increased risk of an acute ischemic stroke and the risks and benefits of this were reviewed in detail again today. Will continue with ASA 81 mg daily at this time. If she has recurrent events, we will need to touch base with Neurology and get their input on starting anticoagulation at that time.  2. HTN - BP is elevated at 168/80 during today's visit. She has been following this closely at home and SBP has been in the 130s-140's since titration of her Irbesartan last week. Will continue current medication regimen at this time. Can further titrate Amlodipine to 10 mg daily if additional blood pressure control is needed.  3. History of Hemorrhagic CVA - occurring in 2015. She denies any residual deficits.   Medication Adjustments/Labs and Tests Ordered: Current medicines are reviewed at length with the patient today.  Concerns regarding medicines are outlined above.  Medication changes, Labs and Tests ordered today are listed in the Patient Instructions below. Patient Instructions  Medication Instructions:  YOU MAY TAKE AN EXTRA 12.5 MG OF LOPRESSOR AS NEEDED FOR PALPITATIONS    Labwork: NONE  Testing/Procedures: Your physician has requested that you have an exercise tolerance test. For further information please visit [facility]uge[facility]iest[facility]tn. Please also follow instruction sheet, as given.  THE MORNING OF STRESS TEST PLEASE DO NOT TAKE LOPRESSOR   Follow-Up: Your physician recommends that you schedule a follow-up appointment in: 2 MONTHS    Any Other Special Instructions Will Be Listed Below (If Applicable).   If you need a refill on your cardiac medications before your next appointment, please call your pharmacy.    Signed, Erma Heritage, PA-C  09/17/2017 4:54 PM    [place], [place], [place]  [address] Phone: [phone]; Fax: [phone]  [address], [place], [place] [address] Phone: [phone]

## 2017-09-17 ENCOUNTER — Ambulatory Visit (INDEPENDENT_AMBULATORY_CARE_PROVIDER_SITE_OTHER): Payer: Medicare Other | Admitting: Student

## 2017-09-17 ENCOUNTER — Encounter: Payer: Self-pay | Admitting: Student

## 2017-09-17 VITALS — BP 168/80 | HR 84 | Ht 67.5 in | Wt 173.0 lb

## 2017-09-17 DIAGNOSIS — I480 Paroxysmal atrial fibrillation: Secondary | ICD-10-CM | POA: Diagnosis not present

## 2017-09-17 DIAGNOSIS — R002 Palpitations: Secondary | ICD-10-CM

## 2017-09-17 DIAGNOSIS — I619 Nontraumatic intracerebral hemorrhage, unspecified: Secondary | ICD-10-CM | POA: Diagnosis not present

## 2017-09-17 DIAGNOSIS — I10 Essential (primary) hypertension: Secondary | ICD-10-CM | POA: Diagnosis not present

## 2017-09-17 NOTE — Patient Instructions (Signed)
Medication Instructions:  YOU MAY TAKE AN EXTRA 12.5 MG OF LOPRESSOR AS NEEDED FOR PALPITATIONS   Labwork: NONE  Testing/Procedures: Your physician has requested that you have an exercise tolerance test. For further information please visit [facility]tn. Please also follow instruction sheet, as given.  THE MORNING OF STRESS TEST PLEASE DO NOT TAKE LOPRESSOR   Follow-Up: Your physician recommends that you schedule a follow-up appointment in: 2 MONTHS    Any Other Special Instructions Will Be Listed Below (If Applicable).     If you need a refill on your cardiac medications before your next appointment, please call your pharmacy.

## 2017-09-26 DIAGNOSIS — I480 Paroxysmal atrial fibrillation: Secondary | ICD-10-CM | POA: Diagnosis not present

## 2017-09-26 DIAGNOSIS — I10 Essential (primary) hypertension: Secondary | ICD-10-CM | POA: Diagnosis not present

## 2017-09-30 ENCOUNTER — Ambulatory Visit (HOSPITAL_COMMUNITY)
Admission: RE | Admit: 2017-09-30 | Discharge: 2017-09-30 | Disposition: A | Discharge status: Home / Self Care | Payer: Medicare Other | Source: Ambulatory Visit | Attending: Student | Admitting: Student

## 2017-09-30 DIAGNOSIS — R002 Palpitations: Secondary | ICD-10-CM | POA: Diagnosis not present

## 2017-09-30 LAB — EXERCISE TOLERANCE TEST
CHL CUP MPHR: 153 {beats}/min
CHL CUP RESTING HR STRESS: 89 {beats}/min
CSEPEW: 7 METS
CSEPPHR: 160 {beats}/min
Exercise duration (min): 5 min
Exercise duration (sec): 1 s
Percent HR: 104 %
RPE: 9

## 2017-10-17 DIAGNOSIS — M1990 Unspecified osteoarthritis, unspecified site: Secondary | ICD-10-CM | POA: Diagnosis not present

## 2017-10-17 DIAGNOSIS — I480 Paroxysmal atrial fibrillation: Secondary | ICD-10-CM | POA: Diagnosis not present

## 2017-10-17 DIAGNOSIS — I10 Essential (primary) hypertension: Secondary | ICD-10-CM | POA: Diagnosis not present

## 2017-10-17 DIAGNOSIS — Z79899 Other long term (current) drug therapy: Secondary | ICD-10-CM | POA: Diagnosis not present

## 2017-10-24 DIAGNOSIS — I10 Essential (primary) hypertension: Secondary | ICD-10-CM | POA: Diagnosis not present

## 2017-10-24 DIAGNOSIS — I480 Paroxysmal atrial fibrillation: Secondary | ICD-10-CM | POA: Diagnosis not present

## 2017-10-24 DIAGNOSIS — F419 Anxiety disorder, unspecified: Secondary | ICD-10-CM | POA: Diagnosis not present

## 2017-11-19 ENCOUNTER — Encounter: Payer: Self-pay | Admitting: Cardiovascular Disease

## 2017-11-19 ENCOUNTER — Ambulatory Visit: Payer: PPO | Admitting: Cardiovascular Disease

## 2017-11-19 VITALS — BP 158/64 | HR 69 | Ht 68.0 in | Wt 161.0 lb

## 2017-11-19 DIAGNOSIS — I10 Essential (primary) hypertension: Secondary | ICD-10-CM

## 2017-11-19 DIAGNOSIS — I480 Paroxysmal atrial fibrillation: Secondary | ICD-10-CM

## 2017-11-19 DIAGNOSIS — I619 Nontraumatic intracerebral hemorrhage, unspecified: Secondary | ICD-10-CM | POA: Diagnosis not present

## 2017-11-19 NOTE — Patient Instructions (Addendum)
Your physician wants you to follow-up in: 6 months with Dr.Koneswaran You will receive a reminder letter in the mail two months in advance. If you don't receive a letter, please call our office to schedule the follow-up appointment.   Your physician recommends that you continue on your current medications as directed. Please refer to the Current Medication list given to you today.    If you need a refill on your cardiac medications before your next appointment, please call your pharmacy.     No lab work or tests ordered today.     Thank you for choosing [facility] !         

## 2017-11-19 NOTE — Progress Notes (Addendum)
SUBJECTIVE: The patient presents for follow-up of paroxysmal atrial fibrillation.  Echocardiogram demonstrated normal left ventricular systolic function, LVEF 39-76%, normal regional wall motion, mild biatrial dilatation, mild mitral and moderate tricuspid regurgitation.  She is on metoprolol 12.5 mg twice daily.  She was instructed that she could take an extra tablet as needed for recurrent palpitations.  She is currently on aspirin 81 mg.  She is not on systemic anticoagulation.  She has a history of hemorrhagic CVA in 2015.  She also has hypertension.  She underwent an exercise tolerance test which showed no evidence of ischemia and no significant arrhythmias.  She is feeling well and denies chest pain, palpitations, leg swelling, and shortness of breath.  She checks her blood pressure routinely at home and it ranges from 110-120/60-70.  She said her blood pressure is always elevated at doctor's offices.  She told me she used to weigh 150 pounds more but now is strict about diet and exercise.  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  Review of Systems: As per "subjective", otherwise negative.  No Known Allergies  Current Outpatient Medications  Medication Sig Dispense Refill  . acetaminophen (TYLENOL) 500 MG tablet Take 500 mg by mouth as needed.     Marland Kitchen amLODipine (NORVASC) 5 MG tablet Take 5 mg by mouth every morning.    Marland Kitchen aspirin EC 81 MG tablet Take 81 mg by mouth daily.    . hydrochlorothiazide (MICROZIDE) 12.5 MG capsule Take 12.5 mg by mouth daily.    . metoprolol tartrate (LOPRESSOR) 25 MG tablet Take 0.5 tablets (12.5 mg total) by mouth 2 (two) times daily. 60 tablet 2  . Multiple Vitamins-Minerals (MULTIVITAMIN WITH MINERALS) tablet Take 1 tablet by mouth daily.    Marland Kitchen olmesartan (BENICAR) 20 MG tablet Take 40 mg by mouth daily.      No current facility-administered medications for this visit.      Past Medical History:  Diagnosis Date  . 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 Hypertension   . Hyperthyroidism    remote  . Uterine cancer [facility])     Past Surgical History:  Procedure Laterality Date  . ABDOMINAL HYSTERECTOMY    . CHOLECYSTECTOMY    . COLONOSCOPY N/A 05/12/2014   Procedure: COLONOSCOPY;  Surgeon: Daneil Dolin, MD;  [facility];  Service: Endoscopy;  Laterality: N/A;  1130  . COLONOSCOPY N/A 07/26/2017   Procedure: COLONOSCOPY;  Surgeon: Daneil Dolin, MD;  [facility];  Service: Endoscopy;  Laterality: N/A;  1:00 pm  . REPLACEMENT TOTAL KNEE BILATERAL     bilateral knees replaced twice    Social History   Socioeconomic History  . Marital status: Married    Spouse name: Not on file  . Number of children: 1  . Years of education: college  . Highest education level: Not on file  Social Needs  . Financial resource strain: Not on file  . Food insecurity - worry: Not on file  . Food insecurity - inability: Not on file  . Transportation needs - medical: Not on file  . Transportation needs - non-medical: Not on file  Occupational History  . Occupation: s    Employer: Therapist, music  Tobacco Use  . Smoking status: Never Smoker  . Smokeless tobacco: Never Used  . Tobacco comment: Never smoked  Substance and Sexual Activity  .  Alcohol use: No    Alcohol/week: 0.0 oz  . Drug use: No  . Sexual activity: Not on file  Other Topics Concern  . Not on file  Social History Narrative   Patient lives at home with her husband.   Patient drinks soda and coffee diet.     Vitals:   11/19/17 1021  BP: (!) 158/64  Pulse: 69  SpO2: 99%  Weight: 161 lb (73 kg)  Height: 5\' 8"  (1.727 m)    Wt Readings from Last 3 Encounters:  11/19/17 161 lb (73 kg)  09/17/17 173 lb (78.5 kg)  09/10/17 170 lb (77.1 kg)     PHYSICAL EXAM General: NAD HEENT: Normal. Neck: No JVD, no thyromegaly. Lungs: Clear to auscultation  bilaterally with normal respiratory effort. CV: Regular rate and rhythm, normal S1/S2, no Y8/A1, soft systolic murmur along left sternal border. No pretibial or periankle edema.  No carotid bruit.   Abdomen: Soft, nontender, no distention.  Neurologic: Alert and oriented.  Psych: Normal affect. Skin: Normal. Musculoskeletal: No gross deformities.    ECG: Most recent ECG reviewed.   Labs: Lab Results  Component Value Date/Time   K 3.4 (L) 09/10/2017 05:45 AM   BUN 11 09/10/2017 05:45 AM   CREATININE 0.83 09/10/2017 05:45 AM   CREATININE 0.96 04/08/2014 05:46 PM   ALT 18 09/10/2017 05:45 AM   TSH 3.552 09/01/2017 04:21 AM   TSH 1.417 04/08/2014 05:46 PM   HGB 13.9 09/10/2017 05:45 AM     Lipids: Lab Results  Component Value Date/Time   LDLCALC 67 04/12/2014 09:45 AM   CHOL 126 04/12/2014 09:45 AM   TRIG 121.0 04/12/2014 09:45 AM   HDL 35.00 (L) 04/12/2014 09:45 AM       ASSESSMENT AND PLAN: 1.  Paroxysmal atrial fibrillation: Presently maintained on low-dose metoprolol tartrate 12.5 mg twice daily.  She is on aspirin 81 mg and not currently on systemic anticoagulation due to history of hemorrhagic CVA.  Given CHA2DS2-VASc score of 6, she is at a high risk for a thromboembolic event.  If she has documented recurrence of atrial fibrillation, a discussion will need to take place with neurology and obtain their input on initiating anticoagulation at that time.  2.  Hypertension: Blood pressure is elevated.  I will increase amlodipine to 10 mg daily.  3.  History of hemorrhagic CVA: This occurred in 2015.  She has no residual deficits.    Disposition: Follow up 6 months   Kate Sable, M.D., F.A.C.C.

## 2017-11-25 DIAGNOSIS — I480 Paroxysmal atrial fibrillation: Secondary | ICD-10-CM | POA: Diagnosis not present

## 2017-11-25 DIAGNOSIS — Z6825 Body mass index (BMI) 25.0-25.9, adult: Secondary | ICD-10-CM | POA: Diagnosis not present

## 2017-11-25 DIAGNOSIS — I10 Essential (primary) hypertension: Secondary | ICD-10-CM | POA: Diagnosis not present

## 2017-12-20 IMAGING — CR DG CHEST 1V PORT
1 series · 1 of 1 positions shown · non-contrast
Comparison: December 06, 2013

CLINICAL DATA: Tachycardia.

EXAM:
PORTABLE CHEST 1 VIEW

[portable]
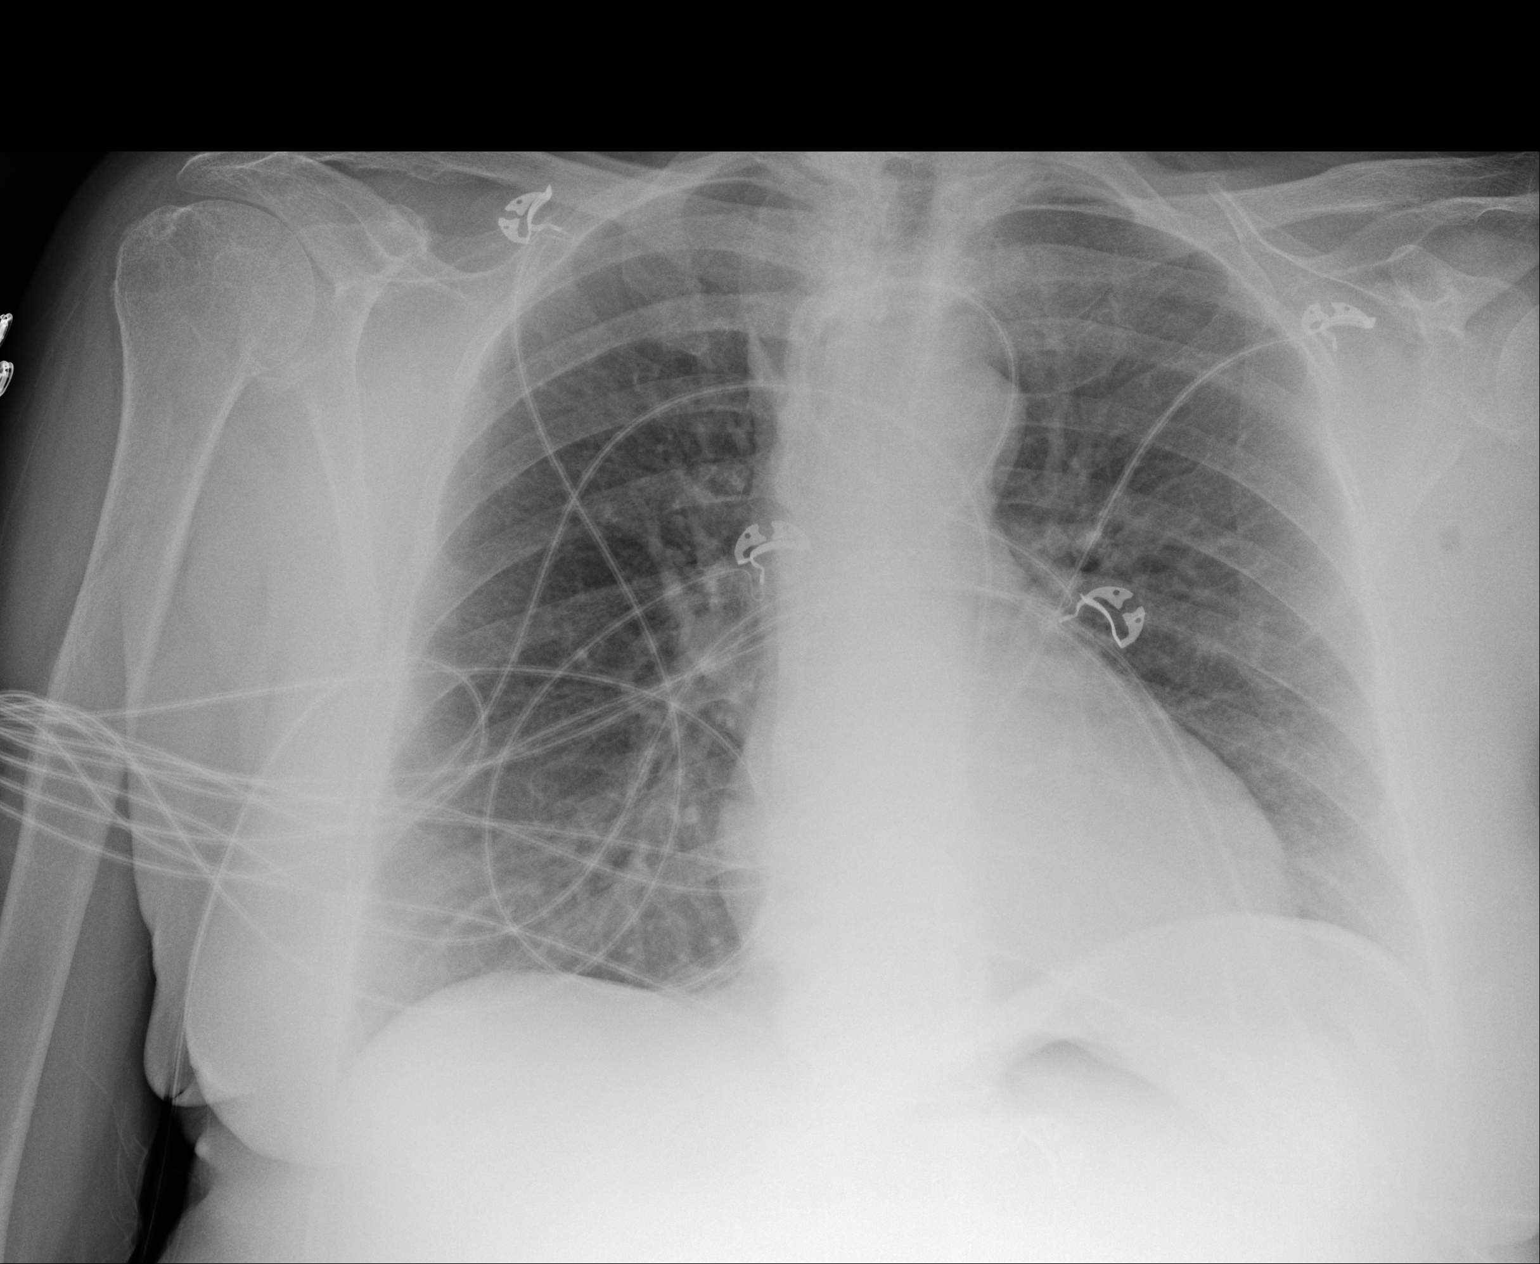

[1 of 1 positions shown; findings below may reference images not displayed]

FINDINGS: The heart size and mediastinal contours are within normal limits.
Both lungs are clear. The visualized skeletal structures are
unremarkable.
IMPRESSION: No active disease.

## 2018-02-25 DIAGNOSIS — I480 Paroxysmal atrial fibrillation: Secondary | ICD-10-CM | POA: Diagnosis not present

## 2018-02-25 DIAGNOSIS — I10 Essential (primary) hypertension: Secondary | ICD-10-CM | POA: Diagnosis not present

## 2018-03-18 ENCOUNTER — Encounter: Payer: Self-pay | Admitting: Cardiovascular Disease

## 2018-03-18 DIAGNOSIS — L821 Other seborrheic keratosis: Secondary | ICD-10-CM | POA: Diagnosis not present

## 2018-05-23 ENCOUNTER — Ambulatory Visit: Payer: Medicare Other | Admitting: Cardiovascular Disease

## 2018-06-04 DIAGNOSIS — I480 Paroxysmal atrial fibrillation: Secondary | ICD-10-CM | POA: Diagnosis not present

## 2018-06-04 DIAGNOSIS — I10 Essential (primary) hypertension: Secondary | ICD-10-CM | POA: Diagnosis not present

## 2018-06-12 ENCOUNTER — Encounter: Payer: Self-pay | Admitting: Cardiovascular Disease

## 2018-06-12 ENCOUNTER — Ambulatory Visit: Payer: PPO | Admitting: Cardiovascular Disease

## 2018-06-12 VITALS — BP 148/76 | HR 47 | Ht 67.5 in | Wt 143.0 lb

## 2018-06-12 DIAGNOSIS — I10 Essential (primary) hypertension: Secondary | ICD-10-CM | POA: Diagnosis not present

## 2018-06-12 DIAGNOSIS — I480 Paroxysmal atrial fibrillation: Secondary | ICD-10-CM | POA: Diagnosis not present

## 2018-06-12 DIAGNOSIS — Z8673 Personal history of transient ischemic attack (TIA), and cerebral infarction without residual deficits: Secondary | ICD-10-CM | POA: Diagnosis not present

## 2018-06-12 NOTE — Progress Notes (Signed)
SUBJECTIVE: The patient presents for follow-up of paroxysmal atrial fibrillation.  Echocardiogram demonstrated normal left ventricular systolic function, LVEF 43-15%, normal regional wall motion, mild biatrial dilatation, mild mitral and moderate tricuspid regurgitation.  She is on metoprolol 12.5 mg in the morning and now takes 6.25 mg in the evening.  She was instructed that she could take an extra tablet as needed for recurrent palpitations.  She is currently on aspirin 81 mg.  She is not on systemic anticoagulation.  She has a history of hemorrhagic CVA in 2015.  She also has hypertension.  She underwent an exercise tolerance test which showed no evidence of ischemia and no significant arrhythmias.  The patient denies any symptoms of chest pain, palpitations, shortness of breath, lightheadedness, dizziness, leg swelling, orthopnea, PND, and syncope.  She exercises 3 days/week.  She has lost 30 pounds in the past several months and has lost 160 pounds total.  She is very careful about what she eats.  Blood pressures at home generally range in the 100-110/50 range.  She gets nervous when she comes to see doctors.  She is here with her husband.  [place] the next few months.  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     Review of Systems: As per "subjective", otherwise negative.  No Known Allergies  Current Outpatient Medications  Medication Sig Dispense Refill  . acetaminophen (TYLENOL) 500 MG tablet Take 500 mg by mouth as needed.     Marland Kitchen amLODipine (NORVASC) 5 MG tablet Take 5 mg by mouth every morning.    Marland Kitchen aspirin EC 81 MG tablet Take 81 mg by mouth daily.    . hydrochlorothiazide (MICROZIDE) 12.5 MG capsule Take 12.5 mg by mouth daily.    . metoprolol tartrate (LOPRESSOR) 25 MG tablet Take 1/2 tab every morning & 1/4 every evening    .  Multiple Vitamins-Minerals (MULTIVITAMIN WITH MINERALS) tablet Take 1 tablet by mouth daily.    Marland Kitchen olmesartan (BENICAR) 20 MG tablet Take 40 mg by mouth daily.      No current facility-administered medications for this visit.     Past Medical History:  Diagnosis Date  . 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 Hypertension   . Hyperthyroidism    remote  . Uterine cancer [facility])     Past Surgical History:  Procedure Laterality Date  . ABDOMINAL HYSTERECTOMY    . CHOLECYSTECTOMY    . COLONOSCOPY N/A 05/12/2014   Procedure: COLONOSCOPY;  Surgeon: Daneil Dolin, MD;  [facility];  Service: Endoscopy;  Laterality: N/A;  1130  . COLONOSCOPY N/A 07/26/2017   Procedure: COLONOSCOPY;  Surgeon: Daneil Dolin, MD;  Location: [facility];  Service: Endoscopy;  Laterality: N/A;  1:00 pm  . REPLACEMENT TOTAL KNEE BILATERAL     bilateral knees replaced twice    Social History   Socioeconomic History  . Marital status: Married    Spouse name: Not on file  . Number of children: 1  . Years of education: college  . Highest education level: Not on file  Occupational History  . Occupation: s    Employer: [employer]  . Financial resource strain: Not on file  . Food insecurity:    Worry: Not on file    Inability: Not on  file  . Transportation needs:    Medical: Not on file    Non-medical: Not on file  Tobacco Use  . Smoking status: Never Smoker  . Smokeless tobacco: Never Used  . Tobacco comment: Never smoked  Substance and Sexual Activity  . Alcohol use: No    Alcohol/week: 0.0 standard drinks  . Drug use: No  . Sexual activity: Not on file  Lifestyle  . Physical activity:    Days per week: Not on file    Minutes per session: Not on file  . Stress: Not on file  Relationships  . Social connections:    Talks on phone: Not on file    Gets together: Not on file    Attends religious service: Not on file    Active member of club or  organization: Not on file    Attends meetings of clubs or organizations: Not on file    Relationship status: Not on file  . Intimate partner violence:    Fear of current or ex partner: Not on file    Emotionally abused: Not on file    Physically abused: Not on file    Forced sexual activity: Not on file  Other Topics Concern  . Not on file  Social History Narrative   Patient lives at home with her husband.   Patient drinks soda and coffee diet.     Vitals:   06/12/18 0942  BP: (!) 148/76  Pulse: (!) 47  SpO2: 100%  Weight: 143 lb (64.9 kg)  Height: 5' 7.5" (1.715 m)    Wt Readings from Last 3 Encounters:  06/12/18 143 lb (64.9 kg)  11/19/17 161 lb (73 kg)  09/17/17 173 lb (78.5 kg)     PHYSICAL EXAM General: NAD HEENT: Normal. Neck: No JVD, no thyromegaly. Lungs: Clear to auscultation bilaterally with normal respiratory effort. CV: Regular rate and rhythm, normal S1/S2, no A6/T0, soft systolic murmur along left sternal border. No pretibial or periankle edema.  No carotid bruit.   Abdomen: Soft, nontender, no distention.  Neurologic: Alert and oriented.  Psych: Normal affect. Skin: Normal. Musculoskeletal: No gross deformities.    ECG: Reviewed above under Subjective   Labs: Lab Results  Component Value Date/Time   K 3.4 (L) 09/10/2017 05:45 AM   BUN 11 09/10/2017 05:45 AM   CREATININE 0.83 09/10/2017 05:45 AM   CREATININE 0.96 04/08/2014 05:46 PM   ALT 18 09/10/2017 05:45 AM   TSH 3.552 09/01/2017 04:21 AM   TSH 1.417 04/08/2014 05:46 PM   HGB 13.9 09/10/2017 05:45 AM     Lipids: Lab Results  Component Value Date/Time   LDLCALC 67 04/12/2014 09:45 AM   CHOL 126 04/12/2014 09:45 AM   TRIG 121.0 04/12/2014 09:45 AM   HDL 35.00 (L) 04/12/2014 09:45 AM       ASSESSMENT AND PLAN:  1.  Paroxysmal atrial fibrillation: Symptomatically stable.  Presently maintained on low-dose metoprolol tartrate 12.5 mg in the morning and now takes 6.25 mg in the  evening.  She denies dizziness and lightheadedness and prefers to stay on this dose. She is on aspirin 81 mg and not currently on systemic anticoagulation due to history of hemorrhagic CVA.  Given CHA2DS2-VASc score of 6, she is at a high risk for a thromboembolic event.  If she has documented recurrence of atrial fibrillation, a discussion will need to take place with neurology and obtain their input on initiating anticoagulation at that time.  2.  Hypertension: Blood pressure is  routinely normal at home and elevated in doctor's offices.  No changes to therapy.  3.  History of hemorrhagic CVA: This occurred in 2015.  She has no residual deficits.    Disposition: Follow up 1 year   Kate Sable, M.D., F.A.C.C.

## 2018-06-12 NOTE — Patient Instructions (Addendum)
Your physician wants you to follow-up in:1 year  with Dr.Koneswaran You will receive a reminder letter in the mail two months in advance. If you don't receive a letter, please call our office to schedule the follow-up appointment.    Your physician recommends that you continue on your current medications as directed. Please refer to the Current Medication list given to you today.    If you need a refill on your cardiac medications before your next appointment, please call your pharmacy.     No lab work or tests today      Thank you for choosing [facility] !        

## 2018-07-08 ENCOUNTER — Other Ambulatory Visit (HOSPITAL_COMMUNITY): Payer: Self-pay | Admitting: Internal Medicine

## 2018-07-08 DIAGNOSIS — Z1231 Encounter for screening mammogram for malignant neoplasm of breast: Secondary | ICD-10-CM

## 2018-07-28 ENCOUNTER — Ambulatory Visit (HOSPITAL_COMMUNITY): Payer: Medicare Other

## 2018-08-07 ENCOUNTER — Ambulatory Visit (HOSPITAL_COMMUNITY)
Admission: RE | Admit: 2018-08-07 | Discharge: 2018-08-07 | Disposition: A | Discharge status: Home / Self Care | Payer: PPO | Source: Ambulatory Visit | Attending: Internal Medicine | Admitting: Internal Medicine

## 2018-08-07 DIAGNOSIS — Z1231 Encounter for screening mammogram for malignant neoplasm of breast: Secondary | ICD-10-CM

## 2018-09-03 DIAGNOSIS — I480 Paroxysmal atrial fibrillation: Secondary | ICD-10-CM | POA: Diagnosis not present

## 2018-09-03 DIAGNOSIS — I10 Essential (primary) hypertension: Secondary | ICD-10-CM | POA: Diagnosis not present

## 2018-10-07 ENCOUNTER — Other Ambulatory Visit: Payer: Self-pay

## 2018-10-07 ENCOUNTER — Encounter (HOSPITAL_COMMUNITY): Payer: Self-pay | Admitting: Emergency Medicine

## 2018-10-07 ENCOUNTER — Emergency Department (HOSPITAL_COMMUNITY)
Admission: EM | Admit: 2018-10-07 | Discharge: 2018-10-07 | Disposition: A | Discharge status: Home / Self Care | Payer: PPO | Attending: Emergency Medicine | Admitting: Emergency Medicine

## 2018-10-07 ENCOUNTER — Emergency Department (HOSPITAL_COMMUNITY): Payer: PPO

## 2018-10-07 DIAGNOSIS — Z79899 Other long term (current) drug therapy: Secondary | ICD-10-CM | POA: Insufficient documentation

## 2018-10-07 DIAGNOSIS — E0590 Thyrotoxicosis, unspecified without thyrotoxic crisis or storm: Secondary | ICD-10-CM | POA: Insufficient documentation

## 2018-10-07 DIAGNOSIS — R002 Palpitations: Secondary | ICD-10-CM | POA: Diagnosis not present

## 2018-10-07 DIAGNOSIS — Z7982 Long term (current) use of aspirin: Secondary | ICD-10-CM | POA: Insufficient documentation

## 2018-10-07 DIAGNOSIS — I10 Essential (primary) hypertension: Secondary | ICD-10-CM | POA: Diagnosis not present

## 2018-10-07 LAB — TROPONIN I: Troponin I: <0.03 ng/mL (ref <0.03)

## 2018-10-07 LAB — BASIC METABOLIC PANEL
Anion gap: 11 (ref 5–15)
BUN: 15 mg/dL (ref 8–23)
CO2: 26 mmol/L (ref 22–32)
CREATININE: 0.72 mg/dL (ref 0.44–1.00)
Calcium: 9.6 mg/dL (ref 8.9–10.3)
Chloride: 95 mmol/L — ABNORMAL LOW (ref 98–111)
GFR calc Af Amer: >60 mL/min (ref >60)
GFR calc non Af Amer: >60 mL/min (ref >60)
Glucose, Bld: 93 mg/dL (ref 70–99)
Potassium: 3.6 mmol/L (ref 3.5–5.1)
Sodium: 132 mmol/L — ABNORMAL LOW (ref 135–145)

## 2018-10-07 LAB — CBC
HCT: 43.9 % (ref 36.0–46.0)
Hemoglobin: 14.7 g/dL (ref 12.0–15.0)
MCH: 32.8 pg (ref 26.0–34.0)
MCHC: 33.5 g/dL (ref 30.0–36.0)
MCV: 98 fL (ref 80.0–100.0)
Platelets: 145 10*3/uL — ABNORMAL LOW (ref 150–400)
RBC: 4.48 MIL/uL (ref 3.87–5.11)
RDW: 12.3 % (ref 11.5–15.5)
WBC: 5.2 10*3/uL (ref 4.0–10.5)
nRBC: 0 % (ref 0.0–0.2)

## 2018-10-07 NOTE — ED Triage Notes (Signed)
Patient complains of chest palpitations and states her BP was 190/90 this morning. States history of TIA and hypertension. Denies nausea, vomiting.

## 2018-10-07 NOTE — ED Provider Notes (Signed)
[facility] EMERGENCY DEPARTMENT Provider Note   CSN: [id number] Arrival date & time: 10/07/18  1206     History   Chief Complaint Chief Complaint  Patient presents with  . Chest Pain  Chief complaint elevated blood pressure  HPI Melissa [name] is a 68 y.o. female.  Patient took her blood pressure this morning and it was noted to be 190/90.  She treated herself with an extra metoprolol 2.5 mg and an extra hydrochlorothiazide tablet.  She denies having had any chest pain .  Denies palpitations.  She has been asymptomatic.  She presents here as she was alarmed about her elevated blood pressure.  She denies  HPI  Past Medical History:  Diagnosis Date  . 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 Hypertension   . Hyperthyroidism    remote  . Uterine cancer [facility])     Patient Active Problem List   Diagnosis Date Noted  . Atrial fibrillation with RVR ([place]) 08/31/2017  . Hypertensive urgency 08/31/2017  . Cognitive deficits, late effect of cerebrovascular disease 05/24/2014  . Abdominal pain, unspecified site 04/26/2014  . Hyperglycemia 02/17/2014  . Severe obesity (BMI >= 40) ([place]) 01/28/2014  . Aphasia due to recent cerebrovascular accident 01/07/2014  . 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 ADENOCARCINOMA, ENDOMETRIUM 08/11/2007  . HYPERTHYROIDISM 05/16/2007  . Essential hypertension 05/16/2007  . OSTEOARTHRITIS 05/16/2007    Past Surgical History:  Procedure Laterality Date  . ABDOMINAL HYSTERECTOMY    . CHOLECYSTECTOMY    . COLONOSCOPY N/A 05/12/2014   Procedure: COLONOSCOPY;  Surgeon: Daneil Dolin, MD;  [facility];  Service: Endoscopy;  Laterality: N/A;  1130  . COLONOSCOPY N/A 07/26/2017   Procedure: COLONOSCOPY;  Surgeon: Daneil Dolin, MD;  Location[facility];  Service: Endoscopy;  Laterality: N/A;  1:00 pm  . REPLACEMENT TOTAL KNEE BILATERAL     bilateral knees replaced twice     OB History   No obstetric history on  file.      Home Medications    Prior to Admission medications   Medication Sig Start Date End Date Taking? Authorizing Provider  acetaminophen (TYLENOL) 500 MG tablet Take 500 mg by mouth as needed.     [provider]  amLODipine (NORVASC) 5 MG tablet Take 5 mg by mouth every morning.    [provider]  aspirin EC 81 MG tablet Take 81 mg by mouth daily.    [provider]  hydrochlorothiazide (MICROZIDE) 12.5 MG capsule Take 12.5 mg by mouth daily.    [provider]  metoprolol tartrate (LOPRESSOR) 25 MG tablet Take 1/2 tab every morning & 1/4 every evening    [provider]  Multiple Vitamins-Minerals (MULTIVITAMIN WITH MINERALS) tablet Take 1 tablet by mouth daily.    [provider]  olmesartan (BENICAR) 20 MG tablet Take 40 mg by mouth daily.  11/14/17   [provider]    Family History Family History  Problem Relation Age of Onset  . CVA Maternal Grandfather 10  . Colon cancer Neg Hx     Social History Social History   Tobacco Use  . Smoking status: Never Smoker  . Smokeless tobacco: Never Used  . Tobacco comment: Never smoked  Substance Use Topics  . Alcohol use: No    Alcohol/week: 0.0 standard drinks  . Drug use: No     Allergies   Patient has no known allergies.   Review of Systems Review  of Systems  Constitutional: Negative.   HENT: Negative.   Respiratory: Negative.   Cardiovascular: Negative.   Gastrointestinal: Negative.   Musculoskeletal: Negative.   Skin: Negative.   Neurological: Negative.   Psychiatric/Behavioral: Negative.   All other systems reviewed and are negative.    Physical Exam Updated Vital Signs BP (!) 199/96 (BP Location: Left Arm)   Pulse 71   Temp 97.7 F (36.5 C) (Oral)   Resp 18   Ht 5\' 8"  (1.727 m)   Wt 63.5 kg   SpO2 99%   BMI 21.29 kg/m   Physical Exam Vitals signs and nursing note reviewed.  Constitutional:      Appearance: She is  well-developed.  HENT:     Head: Normocephalic and atraumatic.  Eyes:     Conjunctiva/sclera: Conjunctivae normal.     Pupils: Pupils are equal, round, and reactive to light.  Neck:     Musculoskeletal: Neck supple.     Thyroid: No thyromegaly.     Trachea: No tracheal deviation.  Cardiovascular:     Rate and Rhythm: Normal rate and regular rhythm.     Heart sounds: No murmur.  Pulmonary:     Effort: Pulmonary effort is normal.     Breath sounds: Normal breath sounds.  Abdominal:     General: Bowel sounds are normal. There is no distension.     Palpations: Abdomen is soft.     Tenderness: There is no abdominal tenderness.  Musculoskeletal: Normal range of motion.        General: No tenderness.  Skin:    General: Skin is warm and dry.     Findings: No rash.  Neurological:     Mental Status: She is alert.     Coordination: Coordination normal.      ED Treatments / Results  Labs (all labs ordered are listed, but only abnormal results are displayed) Labs Reviewed  BASIC METABOLIC PANEL  CBC  TROPONIN I    EKG EKG Interpretation  Date/Time:  Tuesday October 07 2018 12:21:10 EST Ventricular Rate:  69 PR Interval:    QRS Duration: 89 QT Interval:  441 QTC Calculation: 473 R Axis:   42 Text Interpretation:  Sinus rhythm Ventricular premature complex Borderline short PR interval Low voltage, extremity and precordial leads Probable anteroseptal infarct, old Baseline wander in lead(s) V6 No significant change since last tracing Confirmed by Orlie Dakin [phone]) on 10/07/2018 12:38:53 PM   Radiology No results found.  Procedures Procedures (including critical care time)  Medications Ordered in ED Medications - No data to display Results for orders placed or performed during the hospital encounter of 85/02/77  Basic metabolic panel  Result Value Ref Range   Sodium 132 (L) 135 - 145 mmol/L   Potassium 3.6 3.5 - 5.1 mmol/L   Chloride 95 (L) 98 - 111 mmol/L   CO2  26 22 - 32 mmol/L   Glucose, Bld 93 70 - 99 mg/dL   BUN 15 8 - 23 mg/dL   Creatinine, Ser 0.72 0.44 - 1.00 mg/dL   Calcium 9.6 8.9 - 10.3 mg/dL   GFR calc non Af Amer >60 >60 mL/min   GFR calc Af Amer >60 >60 mL/min   Anion gap 11 5 - 15  CBC  Result Value Ref Range   WBC 5.2 4.0 - 10.5 K/uL   RBC 4.48 3.87 - 5.11 MIL/uL   Hemoglobin 14.7 12.0 - 15.0 g/dL   HCT 43.9 36.0 - 46.0 %   MCV  98.0 80.0 - 100.0 fL   MCH 32.8 26.0 - 34.0 pg   MCHC 33.5 30.0 - 36.0 g/dL   RDW 12.3 11.5 - 15.5 %   Platelets 145 (L) 150 - 400 K/uL   nRBC 0.0 0.0 - 0.2 %  Troponin I - ONCE - STAT  Result Value Ref Range   Troponin I <0.03 <0.03 ng/mL   Dg Chest 2 View  Result Date: 10/07/2018 CLINICAL DATA:  Chest palpitations EXAM: CHEST - 2 VIEW COMPARISON:  08/31/2017 FINDINGS: Upper normal heart size. Mediastinal contours and pulmonary vascularity normal. Azygos fissure noted. Lungs clear. No pulmonary infiltrate, pleural effusion, or pneumothorax. No acute osseous findings. Multilevel endplate spur formation thoracic spine. IMPRESSION: No acute abnormalities. Electronically Signed   By: Lavonia Dana M.D.   On: 10/07/2018 13:20    Initial Impression / Assessment and Plan / ED Course  I have reviewed the triage vital signs and the nursing notes.  Pertinent labs & imaging results that were available during my care of the patient were reviewed by me and considered in my medical decision making (see chart for details).   Lab work is unremarkable  1:20 PM patient remains asymptomatic. Patient never had any symptoms today.  I have advised her to get her blood pressure rechecked in a week Dr. Ria Comment office.  She prefers to have medicines adjusted by Dr.Fagan Final Clinical Impressions(s) / ED Diagnoses  Diagnosis elevated blood pressure Final diagnoses:  None    ED Discharge Orders    None       Orlie Dakin, MD 10/07/18 1427

## 2018-10-07 NOTE — Discharge Instructions (Addendum)
Today's blood pressure was elevated at 164/84.  Call Dr. Ria Comment office today to schedule an appointment to get your blood pressure rechecked in his office in a week.  And if you develop chest pain, lightheadedness or feel that your condition has worsened for any reason

## 2018-10-13 DIAGNOSIS — I480 Paroxysmal atrial fibrillation: Secondary | ICD-10-CM | POA: Diagnosis not present

## 2018-10-13 DIAGNOSIS — I10 Essential (primary) hypertension: Secondary | ICD-10-CM | POA: Diagnosis not present

## 2018-11-06 DIAGNOSIS — I10 Essential (primary) hypertension: Secondary | ICD-10-CM | POA: Diagnosis not present

## 2018-11-27 DIAGNOSIS — I10 Essential (primary) hypertension: Secondary | ICD-10-CM | POA: Diagnosis not present

## 2018-11-27 DIAGNOSIS — Z79899 Other long term (current) drug therapy: Secondary | ICD-10-CM | POA: Diagnosis not present

## 2018-11-27 DIAGNOSIS — I480 Paroxysmal atrial fibrillation: Secondary | ICD-10-CM | POA: Diagnosis not present

## 2018-12-09 DIAGNOSIS — Z6822 Body mass index (BMI) 22.0-22.9, adult: Secondary | ICD-10-CM | POA: Diagnosis not present

## 2018-12-09 DIAGNOSIS — I10 Essential (primary) hypertension: Secondary | ICD-10-CM | POA: Diagnosis not present

## 2018-12-09 DIAGNOSIS — I480 Paroxysmal atrial fibrillation: Secondary | ICD-10-CM | POA: Diagnosis not present

## 2019-02-01 ENCOUNTER — Emergency Department (EMERGENCY_DEPARTMENT_HOSPITAL)
Admission: EM | Admit: 2019-02-01 | Discharge: 2019-02-01 | Disposition: A | Discharge status: Home / Self Care | Payer: PPO | Source: Home / Self Care | Attending: Emergency Medicine | Admitting: Emergency Medicine

## 2019-02-01 ENCOUNTER — Encounter (HOSPITAL_COMMUNITY): Payer: Self-pay | Admitting: Emergency Medicine

## 2019-02-01 ENCOUNTER — Emergency Department (HOSPITAL_COMMUNITY): Payer: PPO

## 2019-02-01 ENCOUNTER — Other Ambulatory Visit: Payer: Self-pay

## 2019-02-01 DIAGNOSIS — I69320 Aphasia following cerebral infarction: Secondary | ICD-10-CM | POA: Diagnosis not present

## 2019-02-01 DIAGNOSIS — I4891 Unspecified atrial fibrillation: Secondary | ICD-10-CM | POA: Diagnosis not present

## 2019-02-01 DIAGNOSIS — Z9049 Acquired absence of other specified parts of digestive tract: Secondary | ICD-10-CM | POA: Diagnosis not present

## 2019-02-01 DIAGNOSIS — I340 Nonrheumatic mitral (valve) insufficiency: Secondary | ICD-10-CM | POA: Diagnosis not present

## 2019-02-01 DIAGNOSIS — R000 Tachycardia, unspecified: Secondary | ICD-10-CM | POA: Diagnosis not present

## 2019-02-01 DIAGNOSIS — I480 Paroxysmal atrial fibrillation: Secondary | ICD-10-CM

## 2019-02-01 DIAGNOSIS — I10 Essential (primary) hypertension: Secondary | ICD-10-CM | POA: Insufficient documentation

## 2019-02-01 DIAGNOSIS — R739 Hyperglycemia, unspecified: Secondary | ICD-10-CM | POA: Diagnosis not present

## 2019-02-01 DIAGNOSIS — Z20828 Contact with and (suspected) exposure to other viral communicable diseases: Secondary | ICD-10-CM | POA: Diagnosis not present

## 2019-02-01 DIAGNOSIS — Z8673 Personal history of transient ischemic attack (TIA), and cerebral infarction without residual deficits: Secondary | ICD-10-CM

## 2019-02-01 DIAGNOSIS — Z96643 Presence of artificial hip joint, bilateral: Secondary | ICD-10-CM | POA: Diagnosis not present

## 2019-02-01 DIAGNOSIS — E871 Hypo-osmolality and hyponatremia: Secondary | ICD-10-CM | POA: Diagnosis not present

## 2019-02-01 DIAGNOSIS — Z7982 Long term (current) use of aspirin: Secondary | ICD-10-CM

## 2019-02-01 DIAGNOSIS — I639 Cerebral infarction, unspecified: Secondary | ICD-10-CM | POA: Diagnosis not present

## 2019-02-01 DIAGNOSIS — Z03818 Encounter for observation for suspected exposure to other biological agents ruled out: Secondary | ICD-10-CM | POA: Diagnosis not present

## 2019-02-01 DIAGNOSIS — I440 Atrioventricular block, first degree: Secondary | ICD-10-CM | POA: Diagnosis not present

## 2019-02-01 DIAGNOSIS — I619 Nontraumatic intracerebral hemorrhage, unspecified: Secondary | ICD-10-CM

## 2019-02-01 DIAGNOSIS — E0590 Thyrotoxicosis, unspecified without thyrotoxic crisis or storm: Secondary | ICD-10-CM | POA: Diagnosis not present

## 2019-02-01 DIAGNOSIS — Z79899 Other long term (current) drug therapy: Secondary | ICD-10-CM

## 2019-02-01 DIAGNOSIS — Z8542 Personal history of malignant neoplasm of other parts of uterus: Secondary | ICD-10-CM | POA: Diagnosis not present

## 2019-02-01 DIAGNOSIS — I081 Rheumatic disorders of both mitral and tricuspid valves: Secondary | ICD-10-CM | POA: Diagnosis not present

## 2019-02-01 DIAGNOSIS — Z8541 Personal history of malignant neoplasm of cervix uteri: Secondary | ICD-10-CM

## 2019-02-01 DIAGNOSIS — R002 Palpitations: Secondary | ICD-10-CM | POA: Diagnosis not present

## 2019-02-01 DIAGNOSIS — Z8639 Personal history of other endocrine, nutritional and metabolic disease: Secondary | ICD-10-CM | POA: Diagnosis not present

## 2019-02-01 DIAGNOSIS — Z823 Family history of stroke: Secondary | ICD-10-CM | POA: Diagnosis not present

## 2019-02-01 DIAGNOSIS — I69319 Unspecified symptoms and signs involving cognitive functions following cerebral infarction: Secondary | ICD-10-CM | POA: Diagnosis not present

## 2019-02-01 DIAGNOSIS — R030 Elevated blood-pressure reading, without diagnosis of hypertension: Secondary | ICD-10-CM | POA: Diagnosis not present

## 2019-02-01 DIAGNOSIS — Z7901 Long term (current) use of anticoagulants: Secondary | ICD-10-CM | POA: Diagnosis not present

## 2019-02-01 DIAGNOSIS — R7989 Other specified abnormal findings of blood chemistry: Secondary | ICD-10-CM | POA: Diagnosis not present

## 2019-02-01 DIAGNOSIS — Z90710 Acquired absence of both cervix and uterus: Secondary | ICD-10-CM | POA: Diagnosis not present

## 2019-02-01 DIAGNOSIS — R001 Bradycardia, unspecified: Secondary | ICD-10-CM | POA: Diagnosis not present

## 2019-02-01 HISTORY — DX: Unspecified atrial fibrillation: I48.91

## 2019-02-01 LAB — CBC WITH DIFFERENTIAL/PLATELET
Abs Immature Granulocytes: 0.04 10*3/uL (ref 0.00–0.07)
Basophils Absolute: 0.1 10*3/uL (ref 0.0–0.1)
Basophils Relative: 1 %
Eosinophils Absolute: 0.2 10*3/uL (ref 0.0–0.5)
Eosinophils Relative: 3 %
HCT: 41.8 % (ref 36.0–46.0)
Hemoglobin: 14.8 g/dL (ref 12.0–15.0)
Immature Granulocytes: 1 %
Lymphocytes Relative: 17 %
Lymphs Abs: 0.8 10*3/uL (ref 0.7–4.0)
MCH: 32.9 pg (ref 26.0–34.0)
MCHC: 35.4 g/dL (ref 30.0–36.0)
MCV: 92.9 fL (ref 80.0–100.0)
Monocytes Absolute: 0.4 10*3/uL (ref 0.1–1.0)
Monocytes Relative: 8 %
Neutro Abs: 3.5 10*3/uL (ref 1.7–7.7)
Neutrophils Relative %: 70 %
Platelets: 172 10*3/uL (ref 150–400)
RBC: 4.5 MIL/uL (ref 3.87–5.11)
RDW: 11.9 % (ref 11.5–15.5)
WBC: 5 10*3/uL (ref 4.0–10.5)
nRBC: 0 % (ref 0.0–0.2)

## 2019-02-01 LAB — TROPONIN I: Troponin I: <0.03 ng/mL (ref <0.03)

## 2019-02-01 LAB — BASIC METABOLIC PANEL
Anion gap: 10 (ref 5–15)
BUN: 14 mg/dL (ref 8–23)
CO2: 28 mmol/L (ref 22–32)
Calcium: 9.8 mg/dL (ref 8.9–10.3)
Chloride: 92 mmol/L — ABNORMAL LOW (ref 98–111)
Creatinine, Ser: 0.71 mg/dL (ref 0.44–1.00)
GFR calc Af Amer: >60 mL/min (ref >60)
GFR calc non Af Amer: >60 mL/min (ref >60)
Glucose, Bld: 104 mg/dL — ABNORMAL HIGH (ref 70–99)
Potassium: 3.8 mmol/L (ref 3.5–5.1)
Sodium: 130 mmol/L — ABNORMAL LOW (ref 135–145)

## 2019-02-01 MED ORDER — SODIUM CHLORIDE 0.9 % IV BOLUS
500.0000 mL | Freq: Once | INTRAVENOUS | Status: AC
  Administered 2019-02-01: 500 mL via INTRAVENOUS

## 2019-02-01 MED ORDER — RIVAROXABAN 15 MG PO TABS: 15.0000 mg | ORAL_TABLET | Freq: Once | ORAL | Status: DC

## 2019-02-01 MED ORDER — RIVAROXABAN 20 MG PO TABS
20.0000 mg | ORAL_TABLET | Freq: Once | ORAL | Status: AC
  Administered 2019-02-01: 20 mg via ORAL
  Filled 2019-02-01: qty 1

## 2019-02-01 MED ORDER — RIVAROXABAN 20 MG PO TABS: 20.0000 mg | ORAL_TABLET | Freq: Every day | ORAL | 0 refills | Status: AC

## 2019-02-01 MED ORDER — ETOMIDATE 2 MG/ML IV SOLN
5.0000 mg | Freq: Once | INTRAVENOUS | Status: AC
  Administered 2019-02-01: 5 mg via INTRAVENOUS
  Filled 2019-02-01: qty 10

## 2019-02-01 NOTE — Discharge Instructions (Signed)
Stop your aspirin and start the Xarelto prescription tomorrow.  Call Dr. Court Joy office to arrange a follow-up appt.  Careful to avoid falls.  Return here if needed.

## 2019-02-01 NOTE — Consult Note (Addendum)
Telephone Question  Dr. Alvino Chapel, ED provider and Forest[facility] Na called to ask me a question regarding starting anticoagulation on patient who presented with atrial fibrillation in the ER. He tells me that she has no neurological complaints or deficits.  Is in for call was clearance to start anticoagulation after cardioversion and patient with a remote ICH.  She had history of hemorrhagic stroke (left lentiform nucleus hemorrhage with intraventricular extension.  Reviewed doctor's that this note from 9/29 2015.  Felt hemorrhage was due to uncontrolled hypertension.  Patient was started on aspirin.  At the time, patient did not have atrial fibrillation.  I explained to Dr. Alvino Chapel, that a remote hemorrhage is not an absolute contraindication for anticoagulation.  It appears in her case, etiology for hemorrhage was uncontrolled hypertension.  Reviewed MRI and MRA report-no mention of aneurysms, vascular malformation.  I looked at the MRI brain from 2015-SWI sequence did not show any  evidence to suggest amyloid angiopathy.   Based on my chart review, do think that it is reasonable to anticoagulate patient with atrial fibrillation with high CHA2DS2 VASC.  Remote hypertensive hemorrhage is not an absolute contraindication. I explained it Is important that patient controls her blood pressure to minimize future hemorrhagic risk.  Also, not unreasonable to get a repeat head CT to make sure patient has not had anything asymptomatic (hemorrhage, subacute infarct) that would increase risk of hemorrhagic conversion.   Sushanth Aroor  [facility]

## 2019-02-01 NOTE — ED Provider Notes (Signed)
[facility] EMERGENCY DEPARTMENT Provider Note   CSN: [id number] Arrival date & time: 02/01/19  7672    History   Chief Complaint Chief Complaint  Patient presents with  . Palpitations    HPI Melissa [name] is a 68 y.o. female.     HPI  Melissa [name] is a 68 y.o. female with hx of hyperthyroidism, HTN, atrial fibrillation and previous thalamic hemorrhage with CVA in 2015, presents to the Emergency Department complaining of feeling her heart racing this morning.  States she woke up at 3:00 this morning and felt her heart was beating fast and "skipping"  She took her metoprolol 25 mg at 6:00 am which she states helped slow it down.  She denies chest pain, shortness of breath, nausea or vomiting.  She reports having similar symptoms approximately, had cardiac echo 12/18 that showed LVEF of 60-65%, nml wall motion, mild biatrial dilatation, mild mitral and moderate tricuspid regurgitation.  She denies any new medications, excessive caffeine.  She states that she can feel when her heart is beating irregularly and she is concerned that she is in atrial fibrillation.  Her last episode was greater than one year ago.  Takes 81 mg ASA daily  PCP Dr. Willey Blade Cardiologist Dr. Carolin Sicks    Past Medical History:  Diagnosis Date  . Atrial fibrillation ([place])   . 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 Hypertension   . Hyperthyroidism    remote  . Uterine cancer [facility])     Patient Active Problem List   Diagnosis Date Noted  . Atrial fibrillation with RVR ([place]) 08/31/2017  . Hypertensive urgency 08/31/2017  . Cognitive deficits, late effect of cerebrovascular disease 05/24/2014  . Abdominal pain, unspecified site 04/26/2014  . Hyperglycemia 02/17/2014  . Severe obesity (BMI >= 40) ([place]) 01/28/2014  . Aphasia due to recent cerebrovascular accident 01/07/2014  . Thalamic hemorrhage with stroke ([place]) 12/02/2013  . ADENOCARCINOMA, ENDOMETRIUM 08/11/2007  . HYPERTHYROIDISM  05/16/2007  . Essential hypertension 05/16/2007  . OSTEOARTHRITIS 05/16/2007    Past Surgical History:  Procedure Laterality Date  . ABDOMINAL HYSTERECTOMY    . CHOLECYSTECTOMY    . COLONOSCOPY N/A 05/12/2014   Procedure: COLONOSCOPY;  Surgeon: Daneil Dolin, MD;  [facility];  Service: Endoscopy;  Laterality: N/A;  1130  . COLONOSCOPY N/A 07/26/2017   Procedure: COLONOSCOPY;  Surgeon: Daneil Dolin, MD;  Location: [facility] ENDO [facility];  Service: Endoscopy;  Laterality: N/A;  1:00 pm  . REPLACEMENT TOTAL KNEE BILATERAL     bilateral knees replaced twice     OB History   No obstetric history on file.      Home Medications    Prior to Admission medications   Medication Sig Start Date End Date Taking? Authorizing Provider  acetaminophen (TYLENOL) 500 MG tablet Take 500 mg by mouth as needed.     [provider]  amLODipine (NORVASC) 5 MG tablet Take 5 mg by mouth every morning.    [provider]  aspirin EC 81 MG tablet Take 81 mg by mouth daily.    [provider]  hydrochlorothiazide (MICROZIDE) 12.5 MG capsule Take 12.5 mg by mouth daily.    [provider]  metoprolol tartrate (LOPRESSOR) 25 MG tablet Take 12.5 mg by mouth 2 (two) times daily. Take 1/2 tab every morning & 1/2 every evening    [provider]  Multiple Vitamins-Minerals (MULTIVITAMIN WITH MINERALS) tablet Take 1 tablet by mouth daily.  [provider]  olmesartan (BENICAR) 20 MG tablet Take 40 mg by mouth at bedtime.  11/14/17   [provider]    Family History Family History  Problem Relation Age of Onset  . CVA Maternal Grandfather 17  . Colon cancer Neg Hx     Social History Social History   Tobacco Use  . Smoking status: Never Smoker  . Smokeless tobacco: Never Used  . Tobacco comment: Never smoked  Substance Use Topics  . Alcohol use: No    Alcohol/week: 0.0 standard drinks  . Drug use: No     Allergies   Patient  has no known allergies.   Review of Systems Review of Systems  Constitutional: Negative for chills, fatigue and fever.  Respiratory: Negative for chest tightness, shortness of breath and wheezing.   Cardiovascular: Positive for palpitations. Negative for chest pain and leg swelling.  Gastrointestinal: Negative for abdominal pain, nausea and vomiting.  Genitourinary: Negative for dysuria.  Musculoskeletal: Negative for back pain and myalgias.  Skin: Negative for rash.  Neurological: Negative for dizziness, weakness, numbness and headaches.  Hematological: Does not bruise/bleed easily.     Physical Exam Updated Vital Signs BP (!) 183/97 (BP Location: Right Arm)   Pulse (!) 120   Temp 98 F (36.7 C) (Oral)   Resp 18   Ht 5\' 6"  (1.676 m)   Wt 65.8 kg   SpO2 100%   BMI 23.40 kg/m   Physical Exam Vitals signs and nursing note reviewed.  Constitutional:      General: She is not in acute distress.    Appearance: Normal appearance.  HENT:     Mouth/Throat:     Mouth: Mucous membranes are moist.     Pharynx: Oropharynx is clear.  Neck:     Musculoskeletal: Normal range of motion.     Vascular: No carotid bruit.  Cardiovascular:     Rate and Rhythm: Tachycardia present. Rhythm irregular.     Pulses: Normal pulses.  Pulmonary:     Effort: Pulmonary effort is normal.     Breath sounds: Normal breath sounds. No wheezing or rales.  Abdominal:     General: There is no distension.     Palpations: Abdomen is soft. There is no mass.     Tenderness: There is no abdominal tenderness.  Musculoskeletal: Normal range of motion.  Skin:    General: Skin is warm.     Capillary Refill: Capillary refill takes less than 2 seconds.     Findings: No rash.  Neurological:     General: No focal deficit present.     Mental Status: She is alert and oriented to person, place, and time.     GCS: GCS eye subscore is 4. GCS verbal subscore is 5. GCS motor subscore is 6.     Sensory: Sensation is  intact. No sensory deficit.     Motor: Motor function is intact. No weakness.     Comments: CN II-XII grossly intact.  Speech clear, nml mentation.        ED Treatments / Results  Labs (all labs ordered are listed, but only abnormal results are displayed) Labs Reviewed  BASIC METABOLIC PANEL - Abnormal; Notable for the following components:      Result Value   Sodium 130 (*)    Chloride 92 (*)    Glucose, Bld 104 (*)    All other components within normal limits  CBC WITH DIFFERENTIAL/PLATELET  TROPONIN I    EKG EKG  Interpretation  Date/Time:  Sunday Feb 01 2019 10:07:00 EDT Ventricular Rate:  124 PR Interval:    QRS Duration: 94 QT Interval:  307 QTC Calculation: 441 R Axis:   43 Text Interpretation:  Atrial fibrillation Borderline low voltage, extremity leads Minimal ST depression, lateral leads Confirmed by Davonna Belling [id number]) on 02/01/2019 10:31:23 AM   Radiology Ct Head Wo Contrast  Result Date: 02/01/2019 CLINICAL DATA:  Stroke follow-up.  Patient presents with tachycardia EXAM: CT HEAD WITHOUT CONTRAST TECHNIQUE: Contiguous axial images were obtained from the base of the skull through the vertex without intravenous contrast. COMPARISON:  Brain MRI 12/04/2013 FINDINGS: Brain: No evidence of acute infarction, hemorrhage, hydrocephalus, extra-axial collection or mass lesion/mass effect. Moderate to extensive chronic small vessel ischemic change in the cerebral white matter. Blunted left thalamus where there was hemorrhage remotely. Vascular: No hyperdense vessel or unexpected calcification. Skull: Normal. Negative for fracture or focal lesion. Sinuses/Orbits: No acute finding. IMPRESSION: 1. No acute finding. 2. Chronic small vessel ischemia. Electronically Signed   By: Monte Fantasia M.D.   On: 02/01/2019 12:08   Dg Chest Portable 1 View  Result Date: 02/01/2019 CLINICAL DATA:  Heart racing EXAM: PORTABLE CHEST 1 VIEW COMPARISON:  10/07/2018 FINDINGS: Normal heart size  and mediastinal contours. There is no edema, consolidation, effusion, or pneumothorax. No acute osseous finding IMPRESSION: No evidence of active disease. Electronically Signed   By: Monte Fantasia M.D.   On: 02/01/2019 10:42    Procedures Procedures (including critical care time)  Medications Ordered in ED Medications - No data to display   Initial Impression / Assessment and Plan / ED Course  I have reviewed the triage vital signs and the nursing notes.  Pertinent labs & imaging results that were available during my care of the patient were reviewed by me and considered in my medical decision making (see chart for details).        Pt with hx of PAF and thalamic hemorrhage with CVA 5 yrs ago.  Sudden onset of rapid heart beat this morning upon waking.  Last episode of A fib over one year ago.  Mildly irregular rhythm and tachycardic here.  Possible candidate for cardioversion, discussed with Dr. Alvino Chapel.  Since pt's hx of stroke, he will consulted neurology.  Who recommended CT head.  See his note for cardioversion procedure  Pt has been observed with stable sinus rhythm. Repeat EKG reviewed by Dr. Alvino Chapel shows nml sinus rhythm.   Will start pt on Xarelto 20 mg , (CrCl > 50)  She agrees to close cards f/u.  Return precautions discussed.    Final Clinical Impressions(s) / ED Diagnoses   Final diagnoses:  Paroxysmal atrial fibrillation with RVR [facility])    ED Discharge Orders    None       Bufford Lope 02/01/19 1432    Davonna Belling, MD 02/03/19 Cruz Condon, MD 02/03/19 [phone]

## 2019-02-01 NOTE — ED Triage Notes (Signed)
Pt here due to checking her pulse this morning and finding it elevated at 115. Denies any symptoms and has not been having any pain, shortness of breath, or palpitations. States she fears she is in afib and wanted to be checked.

## 2019-02-01 NOTE — ED Notes (Signed)
1250 pt given ediate given per order 1251 pt cardioverted with 150j\ 1310 pt awake aoX4 able to move all four limbs family contacted per her husband.

## 2019-02-02 DIAGNOSIS — I480 Paroxysmal atrial fibrillation: Secondary | ICD-10-CM | POA: Diagnosis not present

## 2019-02-02 DIAGNOSIS — R030 Elevated blood-pressure reading, without diagnosis of hypertension: Secondary | ICD-10-CM | POA: Diagnosis not present

## 2019-02-03 ENCOUNTER — Other Ambulatory Visit: Payer: Self-pay

## 2019-02-03 ENCOUNTER — Encounter (HOSPITAL_COMMUNITY): Payer: Self-pay | Admitting: Emergency Medicine

## 2019-02-03 ENCOUNTER — Inpatient Hospital Stay (HOSPITAL_COMMUNITY)
Admission: EM | Admit: 2019-02-03 | Discharge: 2019-02-05 | DRG: 309 | Disposition: A | Discharge status: Home / Self Care | Payer: PPO | Attending: Internal Medicine | Admitting: Internal Medicine

## 2019-02-03 DIAGNOSIS — Z90710 Acquired absence of both cervix and uterus: Secondary | ICD-10-CM

## 2019-02-03 DIAGNOSIS — E0590 Thyrotoxicosis, unspecified without thyrotoxic crisis or storm: Secondary | ICD-10-CM | POA: Diagnosis present

## 2019-02-03 DIAGNOSIS — R739 Hyperglycemia, unspecified: Secondary | ICD-10-CM | POA: Diagnosis present

## 2019-02-03 DIAGNOSIS — I10 Essential (primary) hypertension: Secondary | ICD-10-CM | POA: Diagnosis present

## 2019-02-03 DIAGNOSIS — I69319 Unspecified symptoms and signs involving cognitive functions following cerebral infarction: Secondary | ICD-10-CM | POA: Diagnosis not present

## 2019-02-03 DIAGNOSIS — I619 Nontraumatic intracerebral hemorrhage, unspecified: Secondary | ICD-10-CM | POA: Diagnosis present

## 2019-02-03 DIAGNOSIS — Z9049 Acquired absence of other specified parts of digestive tract: Secondary | ICD-10-CM | POA: Diagnosis not present

## 2019-02-03 DIAGNOSIS — I440 Atrioventricular block, first degree: Secondary | ICD-10-CM | POA: Diagnosis not present

## 2019-02-03 DIAGNOSIS — I480 Paroxysmal atrial fibrillation: Principal | ICD-10-CM | POA: Diagnosis present

## 2019-02-03 DIAGNOSIS — I69320 Aphasia following cerebral infarction: Secondary | ICD-10-CM | POA: Diagnosis not present

## 2019-02-03 DIAGNOSIS — Z8639 Personal history of other endocrine, nutritional and metabolic disease: Secondary | ICD-10-CM

## 2019-02-03 DIAGNOSIS — Z823 Family history of stroke: Secondary | ICD-10-CM

## 2019-02-03 DIAGNOSIS — I4891 Unspecified atrial fibrillation: Secondary | ICD-10-CM | POA: Diagnosis present

## 2019-02-03 DIAGNOSIS — R001 Bradycardia, unspecified: Secondary | ICD-10-CM | POA: Diagnosis not present

## 2019-02-03 DIAGNOSIS — E871 Hypo-osmolality and hyponatremia: Secondary | ICD-10-CM | POA: Diagnosis present

## 2019-02-03 DIAGNOSIS — Z7982 Long term (current) use of aspirin: Secondary | ICD-10-CM

## 2019-02-03 DIAGNOSIS — Z96643 Presence of artificial hip joint, bilateral: Secondary | ICD-10-CM | POA: Diagnosis present

## 2019-02-03 DIAGNOSIS — I340 Nonrheumatic mitral (valve) insufficiency: Secondary | ICD-10-CM | POA: Diagnosis not present

## 2019-02-03 DIAGNOSIS — Z20828 Contact with and (suspected) exposure to other viral communicable diseases: Secondary | ICD-10-CM | POA: Diagnosis present

## 2019-02-03 DIAGNOSIS — Z79899 Other long term (current) drug therapy: Secondary | ICD-10-CM

## 2019-02-03 DIAGNOSIS — R7989 Other specified abnormal findings of blood chemistry: Secondary | ICD-10-CM | POA: Diagnosis present

## 2019-02-03 DIAGNOSIS — Z8542 Personal history of malignant neoplasm of other parts of uterus: Secondary | ICD-10-CM | POA: Diagnosis not present

## 2019-02-03 DIAGNOSIS — I081 Rheumatic disorders of both mitral and tricuspid valves: Secondary | ICD-10-CM | POA: Diagnosis present

## 2019-02-03 DIAGNOSIS — Z7901 Long term (current) use of anticoagulants: Secondary | ICD-10-CM

## 2019-02-03 LAB — CBC WITH DIFFERENTIAL/PLATELET
Abs Immature Granulocytes: 0.04 10*3/uL (ref 0.00–0.07)
Basophils Absolute: 0 10*3/uL (ref 0.0–0.1)
Basophils Relative: 1 %
Eosinophils Absolute: 0.1 10*3/uL (ref 0.0–0.5)
Eosinophils Relative: 1 %
HCT: 38.9 % (ref 36.0–46.0)
Hemoglobin: 14.1 g/dL (ref 12.0–15.0)
Immature Granulocytes: 1 %
Lymphocytes Relative: 12 %
Lymphs Abs: 0.8 10*3/uL (ref 0.7–4.0)
MCH: 33.7 pg (ref 26.0–34.0)
MCHC: 36.2 g/dL — ABNORMAL HIGH (ref 30.0–36.0)
MCV: 92.8 fL (ref 80.0–100.0)
Monocytes Absolute: 0.5 10*3/uL (ref 0.1–1.0)
Monocytes Relative: 9 %
Neutro Abs: 4.8 10*3/uL (ref 1.7–7.7)
Neutrophils Relative %: 76 %
Platelets: 167 10*3/uL (ref 150–400)
RBC: 4.19 MIL/uL (ref 3.87–5.11)
RDW: 11.9 % (ref 11.5–15.5)
WBC: 6.3 10*3/uL (ref 4.0–10.5)
nRBC: 0 % (ref 0.0–0.2)

## 2019-02-03 LAB — TSH: TSH: 2.058 u[IU]/mL (ref 0.350–4.500)

## 2019-02-03 LAB — BASIC METABOLIC PANEL
Anion gap: 12 (ref 5–15)
BUN: 16 mg/dL (ref 8–23)
CO2: 25 mmol/L (ref 22–32)
Calcium: 10.1 mg/dL (ref 8.9–10.3)
Chloride: 94 mmol/L — ABNORMAL LOW (ref 98–111)
Creatinine, Ser: 0.75 mg/dL (ref 0.44–1.00)
GFR calc Af Amer: >60 mL/min (ref >60)
GFR calc non Af Amer: >60 mL/min (ref >60)
Glucose, Bld: 120 mg/dL — ABNORMAL HIGH (ref 70–99)
Potassium: 3.8 mmol/L (ref 3.5–5.1)
Sodium: 131 mmol/L — ABNORMAL LOW (ref 135–145)

## 2019-02-03 LAB — TROPONIN I
Troponin I: 0.05 ng/mL (ref <0.03)
Troponin I: 0.05 ng/mL (ref <0.03)
Troponin I: 0.04 ng/mL (ref <0.03)

## 2019-02-03 LAB — SARS CORONAVIRUS 2 BY RT PCR (HOSPITAL ORDER, PERFORMED IN ~~LOC~~ HOSPITAL LAB): SARS Coronavirus 2: NEGATIVE

## 2019-02-03 LAB — MRSA PCR SCREENING: MRSA by PCR: NEGATIVE

## 2019-02-03 LAB — T4, FREE: Free T4: 1.11 ng/dL (ref 0.82–1.77)

## 2019-02-03 LAB — MAGNESIUM: Magnesium: 1.8 mg/dL (ref 1.7–2.4)

## 2019-02-03 LAB — HEMOGLOBIN A1C
Hgb A1c MFr Bld: 4.8 % (ref 4.8–5.6)
Mean Plasma Glucose: 91.06 mg/dL

## 2019-02-03 MED ORDER — RIVAROXABAN 20 MG PO TABS
20.0000 mg | ORAL_TABLET | Freq: Every day | ORAL | Status: DC
  Administered 2019-02-03 – 2019-02-04 (×2): 20 mg via ORAL
  Filled 2019-02-03 (×2): qty 1

## 2019-02-03 MED ORDER — ACETAMINOPHEN 325 MG PO TABS: 650.0000 mg | ORAL_TABLET | ORAL | Status: DC | PRN

## 2019-02-03 MED ORDER — DILTIAZEM HCL 100 MG IV SOLR
5.0000 mg/h | INTRAVENOUS | Status: DC
  Administered 2019-02-03: 17:00:00 12.5 mg/h via INTRAVENOUS
  Administered 2019-02-03: 11:00:00 5 mg/h via INTRAVENOUS
  Administered 2019-02-04: 04:00:00 7.5 mg/h via INTRAVENOUS
  Filled 2019-02-03 (×3): qty 100

## 2019-02-03 MED ORDER — METOPROLOL TARTRATE 50 MG PO TABS
50.0000 mg | ORAL_TABLET | Freq: Two times a day (BID) | ORAL | Status: DC
  Administered 2019-02-03 – 2019-02-04 (×2): 50 mg via ORAL
  Filled 2019-02-03 (×2): qty 1

## 2019-02-03 MED ORDER — ONDANSETRON HCL 4 MG/2ML IJ SOLN: 4.0000 mg | Freq: Four times a day (QID) | INTRAMUSCULAR | Status: DC | PRN

## 2019-02-03 MED ORDER — IRBESARTAN 300 MG PO TABS
300.0000 mg | ORAL_TABLET | Freq: Every day | ORAL | Status: DC
  Administered 2019-02-03 – 2019-02-04 (×2): 300 mg via ORAL
  Filled 2019-02-03 (×2): qty 1

## 2019-02-03 MED ORDER — ALPRAZOLAM 0.25 MG PO TABS
0.2500 mg | ORAL_TABLET | Freq: Three times a day (TID) | ORAL | Status: DC | PRN
  Administered 2019-02-03 – 2019-02-04 (×4): 0.25 mg via ORAL
  Filled 2019-02-03 (×4): qty 1

## 2019-02-03 MED ORDER — DILTIAZEM LOAD VIA INFUSION
20.0000 mg | Freq: Once | INTRAVENOUS | Status: AC
  Administered 2019-02-03: 20 mg via INTRAVENOUS
  Filled 2019-02-03: qty 20

## 2019-02-03 MED ORDER — NON FORMULARY: 40.0000 mg | Freq: Every day | Status: DC

## 2019-02-03 MED ORDER — ADULT MULTIVITAMIN W/MINERALS CH
1.0000 | ORAL_TABLET | Freq: Every day | ORAL | Status: DC
  Administered 2019-02-03 – 2019-02-05 (×3): 1 via ORAL
  Filled 2019-02-03 (×3): qty 1

## 2019-02-03 NOTE — ED Notes (Signed)
CRITICAL VALUE ALERT  Critical Value:  Trop 0.05  Date & Time Notied:  02/03/19, 1118  Provider Notified: Dr. Wilson Singer  Orders Received/Actions taken: no new orders

## 2019-02-03 NOTE — ED Provider Notes (Signed)
[facility] EMERGENCY DEPARTMENT Provider Note   CSN: [id number] Arrival date & time: 02/03/19  4098    History   Chief Complaint Chief Complaint  Patient presents with  . Atrial Fibrillation    HPI Melissa [name] is a 68 y.o. female.     HPI   68 year old female with palpitations.  History of A. fib.  She felt like she went back into A. fib this morning.  She has had persistent shortness of breath since then.  Of note, she was just seen in the emergency room on 02/01/19 and was cardioverted.  She was started on anticoagulation.  She denies any pain.  She reports compliance with her medications including taking her metoprolol this morning.  Her cardiologist is Dr. Bronson Ing.  Past Medical History:  Diagnosis Date  . Atrial fibrillation ([place])   . 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 Hypertension   . Hyperthyroidism    remote  . Uterine cancer [facility])     Patient Active Problem List   Diagnosis Date Noted  . Atrial fibrillation with RVR ([place]) 08/31/2017  . Hypertensive urgency 08/31/2017  . Cognitive deficits, late effect of cerebrovascular disease 05/24/2014  . Abdominal pain, unspecified site 04/26/2014  . Hyperglycemia 02/17/2014  . Severe obesity (BMI >= 40) ([place]) 01/28/2014  . Aphasia due to recent cerebrovascular accident 01/07/2014  . Thalamic hemorrhage with stroke ([place]) 12/02/2013  . ADENOCARCINOMA, ENDOMETRIUM 08/11/2007  . HYPERTHYROIDISM 05/16/2007  . Essential hypertension 05/16/2007  . OSTEOARTHRITIS 05/16/2007    Past Surgical History:  Procedure Laterality Date  . ABDOMINAL HYSTERECTOMY    . CHOLECYSTECTOMY    . COLONOSCOPY N/A 05/12/2014   Procedure: COLONOSCOPY;  Surgeon: Daneil Dolin, MD;  Location[facility];  Service: Endoscopy;  Laterality: N/A;  1130  . COLONOSCOPY N/A 07/26/2017   Procedure: COLONOSCOPY;  Surgeon: Daneil Dolin, MD;  Location: [facility];  Service: Endoscopy;  Laterality: N/A;  1:00 pm  . REPLACEMENT  TOTAL KNEE BILATERAL     bilateral knees replaced twice     OB History   No obstetric history on file.      Home Medications    Prior to Admission medications   Medication Sig Start Date End Date Taking? Authorizing Provider  acetaminophen (TYLENOL) 500 MG tablet Take 500 mg by mouth as needed.     [provider]  amLODipine (NORVASC) 5 MG tablet Take 5 mg by mouth every morning.    [provider]  chlorthalidone (HYGROTON) 25 MG tablet Take 25 mg by mouth daily.  11/06/18   [provider]  metoprolol tartrate (LOPRESSOR) 25 MG tablet Take 25 mg by mouth 2 (two) times daily.     [provider]  Multiple Vitamins-Minerals (MULTIVITAMIN WITH MINERALS) tablet Take 1 tablet by mouth daily.    [provider]  olmesartan (BENICAR) 40 MG tablet Take 40 mg by mouth daily.  12/01/18   [provider]  rivaroxaban (XARELTO) 20 MG TABS tablet Take 1 tablet (20 mg total) by mouth daily with supper. 02/01/19   Kem Parkinson, PA-C    Family History Family History  Problem Relation Age of Onset  . CVA Maternal Grandfather 31  . Colon cancer Neg Hx     Social History Social History   Tobacco Use  . Smoking status: Never Smoker  . Smokeless tobacco: Never Used  . Tobacco comment: Never smoked  Substance Use Topics  . Alcohol use: No  Alcohol/week: 0.0 standard drinks  . Drug use: No     Allergies   Patient has no known allergies.   Review of Systems Review of Systems  All systems reviewed and negative, other than as noted in HPI.  Physical Exam Updated Vital Signs Ht 5\' 6"  (1.676 m)   Wt 65.8 kg   BMI 23.40 kg/m   Physical Exam Vitals signs and nursing note reviewed.  Constitutional:      General: She is not in acute distress.    Appearance: She is well-developed.  HENT:     Head: Normocephalic and atraumatic.  Eyes:     General:        Right eye: No discharge.        Left eye: No discharge.      Conjunctiva/sclera: Conjunctivae normal.  Neck:     Musculoskeletal: Neck supple.  Cardiovascular:     Rate and Rhythm: Tachycardia present.     Heart sounds: Normal heart sounds. No murmur. No friction rub. No gallop.      Comments: Tachycardic.  Irregularly irregular. Pulmonary:     Effort: Pulmonary effort is normal. No respiratory distress.     Breath sounds: Normal breath sounds.  Abdominal:     General: There is no distension.     Palpations: Abdomen is soft.     Tenderness: There is no abdominal tenderness.  Musculoskeletal:        General: No tenderness.  Skin:    General: Skin is warm and dry.  Neurological:     Mental Status: She is alert.  Psychiatric:        Behavior: Behavior normal.        Thought Content: Thought content normal.      ED Treatments / Results  Labs (all labs ordered are listed, but only abnormal results are displayed) Labs Reviewed  CBC WITH DIFFERENTIAL/PLATELET - Abnormal; Notable for the following components:      Result Value   MCHC 36.2 (*)    All other components within normal limits  BASIC METABOLIC PANEL - Abnormal; Notable for the following components:   Sodium 131 (*)    Chloride 94 (*)    Glucose, Bld 120 (*)    All other components within normal limits  TROPONIN I - Abnormal; Notable for the following components:   Troponin I 0.05 (*)    All other components within normal limits  SARS CORONAVIRUS 2 (HOSPITAL ORDER, [facility])  MAGNESIUM    EKG EKG Interpretation  Date/Time:  Tuesday Feb 03 2019 09:55:39 EDT Ventricular Rate:  135 PR Interval:    QRS Duration: 88 QT Interval:  327 QTC Calculation: 491 R Axis:   62 Text Interpretation:  Atrial fibrillation Low voltage, extremity leads Repol abnrm suggests ischemia, inferior leads Confirmed by Virgel Manifold [phone]) on 02/03/2019 10:34:49 AM   Radiology Ct Head Wo Contrast  Result Date: 02/01/2019 CLINICAL DATA:  Stroke follow-up.  Patient  presents with tachycardia EXAM: CT HEAD WITHOUT CONTRAST TECHNIQUE: Contiguous axial images were obtained from the base of the skull through the vertex without intravenous contrast. COMPARISON:  Brain MRI 12/04/2013 FINDINGS: Brain: No evidence of acute infarction, hemorrhage, hydrocephalus, extra-axial collection or mass lesion/mass effect. Moderate to extensive chronic small vessel ischemic change in the cerebral white matter. Blunted left thalamus where there was hemorrhage remotely. Vascular: No hyperdense vessel or unexpected calcification. Skull: Normal. Negative for fracture or focal lesion. Sinuses/Orbits: No acute finding. IMPRESSION: 1. No acute  finding. 2. Chronic small vessel ischemia. Electronically Signed   By: Monte Fantasia M.D.   On: 02/01/2019 12:08   Dg Chest Portable 1 View  Result Date: 02/01/2019 CLINICAL DATA:  Heart racing EXAM: PORTABLE CHEST 1 VIEW COMPARISON:  10/07/2018 FINDINGS: Normal heart size and mediastinal contours. There is no edema, consolidation, effusion, or pneumothorax. No acute osseous finding IMPRESSION: No evidence of active disease. Electronically Signed   By: Monte Fantasia M.D.   On: 02/01/2019 10:42    Procedures Procedures (including critical care time)  CRITICAL CARE Performed by: Virgel Manifold Total critical care time: 35 minutes Critical care time was exclusive of separately billable procedures and treating other patients. Critical care was necessary to treat or prevent imminent or life-threatening deterioration. Critical care was time spent personally by me on the following activities: development of treatment plan with patient and/or surrogate as well as nursing, discussions with consultants, evaluation of patient's response to treatment, examination of patient, obtaining history from patient or surrogate, ordering and performing treatments and interventions, ordering and review of laboratory studies, ordering and review of radiographic studies,  pulse oximetry and re-evaluation of patient's condition.   Medications Ordered in ED Medications - No data to display   Initial Impression / Assessment and Plan / ED Course  I have reviewed the triage vital signs and the nursing notes.  Pertinent labs & imaging results that were available during my care of the patient were reviewed by me and considered in my medical decision making (see chart for details).  68 year old female with recurrent atrial fibrillation.  Cardioverted in the emergency room 3 days ago.  She is not willing to attempt this again.  Given a bolus of 20 mg of Cardizem and started on a Cardizem drip.  She remains with a rate of 100-130 despite titration up.  Mild elevation of troponin.  Probably rate related.  She denies any chest pain or dyspnea. Electrolytes ok.   Final Clinical Impressions(s) / ED Diagnoses   Final diagnoses:  Atrial fibrillation with rapid ventricular response [facility])    ED Discharge Orders    None       Virgel Manifold, MD 02/03/19 1247

## 2019-02-03 NOTE — ED Triage Notes (Signed)
Pt reports that she feels like she is in Afib again. HR at home was 120s. Pt was seen in ED on Sunday and started on a new anticoagulant for Afib. Pt also endorses generalized weakness. Denies SOB, palpitations, or CP.

## 2019-02-03 NOTE — ED Notes (Signed)
Patient denies pain and is resting comfortably.  

## 2019-02-03 NOTE — ED Provider Notes (Signed)
  Physical Exam  BP 131/70   Pulse (!) 58   Temp 98 F (36.7 C) (Oral)   Resp 14   Ht 5\' 6"  (1.676 m)   Wt 65.8 kg   SpO2 100%   BMI 23.40 kg/m   Physical Exam  ED Course/Procedures     Sedation procedure Date/Time: 02/02/2019 12:24 AM Performed by: Davonna Belling, MD Authorized by: Davonna Belling, MD   Consent:    Consent obtained:  Written   Consent given by:  Patient   Risks discussed:  Allergic reaction, dysrhythmia, inadequate sedation, nausea, prolonged sedation necessitating reversal and prolonged hypoxia resulting in organ damage   Alternatives discussed:  Analgesia without sedation Universal protocol:    Immediately prior to procedure a time out was called: yes     Patient identity confirmation method:  Arm band Indications:    Procedure performed:  Cardioversion Pre-sedation assessment:    Time since last food or drink:  5   NPO status caution: unable to specify NPO status     ASA classification: class 3 - patient with severe systemic disease     Mallampati score:  III - soft palate, base of uvula visible   Pre-sedation assessments completed and reviewed: airway patency, cardiovascular function, hydration status, mental status, nausea/vomiting and pain level   Immediate pre-procedure details:    Reassessment: Patient reassessed immediately prior to procedure     Reviewed: vital signs and NPO status     Verified: bag valve mask available   Procedure details (see MAR for exact dosages):    Preoxygenation:  Nasal cannula   Sedation:  Etomidate   Intra-procedure monitoring:  Blood pressure monitoring, cardiac monitor and continuous pulse oximetry   Intra-procedure events: none     Total Provider sedation time (minutes):  5 Post-procedure details:    Attendance: Constant attendance by certified staff until patient recovered     Recovery: Patient returned to pre-procedure baseline     Post-sedation assessments completed and reviewed: airway patency,  cardiovascular function and hydration status     Patient is stable for discharge or admission: yes     Patient tolerance:  Tolerated well, no immediate complications .Cardioversion Date/Time: 02/03/2019 12:25 AM Performed by: Davonna Belling, MD Authorized by: Davonna Belling, MD   Consent:    Consent obtained:  Verbal   Consent given by:  Patient   Risks discussed:  Cutaneous burn, induced arrhythmia and death   Alternatives discussed:  No treatment and rate-control medication Pre-procedure details:    Cardioversion basis:  Elective   Rhythm:  Atrial fibrillation Patient sedated: Yes. Refer to sedation procedure documentation for details of sedation.  Attempt one:    Cardioversion mode:  Synchronous   Waveform:  Biphasic   Shock (Joules):  150   Shock outcome:  Conversion to normal sinus rhythm Post-procedure details:    Patient status:  Awake   Patient tolerance of procedure:  Tolerated well, no immediate complications    MDM         Davonna Belling, MD 02/03/19 0025

## 2019-02-03 NOTE — H&P (Addendum)
History and Physical  [name] NLZ:[id number] DOB: [birth year] DOA: 02/03/2019  Referring physician: Archie Patten MD  PCP: Asencion Noble, MD   Chief Complaint: "feels like afib is back"  HPI: Melissa [name] is a 68 y.o. female with paroxysmal atrial fibrillation, history of small left thalamic hemorrhage in 2015 thought secondary to uncontrolled hypertension returned to ED today with symptoms of recurrent atrial fibrillation.  The patient was seen 2 days ago in the ED with A. fib RVR and was treated with an electrical cardioversion.  After consultation with neurology (Dr. Lorraine Lax) she was discharged on metoprolol 25 mg twice daily and started on rivaroxaban for full anticoagulation.  The patient reports that her heart rate at home was in the 120s.  She reports generalized weakness.  She denies chest pain but has had symptoms of shortness of breath.  She did feel the palpitations.  She says that she has been taking her medications and has not missed any doses.  ED course: The patient was noted to be in moderate distress.  Her heart rate was in the 135 range.  Her blood pressure was 150/85.  She sat at 100% on room air.  The patient was given 20 mg diltiazem IV and started on IV diltiazem infusion.  She has been having steady heart rate in the 100-130 range.  Her troponin was mildly elevated at 0.05.  Her EKG was consistent with atrial fibrillation.  Her COVID 19 test was negative.  Her BMP was mostly unchanged from 2 days ago.  She has a normal creatinine.  Her sodium was 131.  Her hemoglobin 14.1.  Platelet count 167.  The patient was offered to receive another cardioversion in the ED however she has adamantly declined and is being admitted to the stepdown unit for further treatment.   Review of Systems: All systems reviewed and apart from history of presenting illness, are negative.  Past Medical History:  Diagnosis Date  . Atrial fibrillation ([place])   . 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 Hypertension   . Hyperthyroidism    remote  . Uterine cancer [facility])    Past Surgical History:  Procedure Laterality Date  . ABDOMINAL HYSTERECTOMY    . CHOLECYSTECTOMY    . COLONOSCOPY N/A 05/12/2014   Procedure: COLONOSCOPY;  Surgeon: Daneil Dolin, MD;  Location[facility];  Service: Endoscopy;  Laterality: N/A;  1130  . COLONOSCOPY N/A 07/26/2017   Procedure: COLONOSCOPY;  Surgeon: Daneil Dolin, MD;  Location: [facility];  Service: Endoscopy;  Laterality: N/A;  1:00 pm  . REPLACEMENT TOTAL KNEE BILATERAL     bilateral knees replaced twice   Social History:  reports that she has never smoked. She has never used smokeless tobacco. She reports that she does not drink alcohol or use drugs.  No Known Allergies  Family History  Problem Relation Age of Onset  . CVA Maternal Grandfather 63  . Colon cancer Neg Hx     Prior to Admission medications   Medication Sig Start Date End Date Taking? Authorizing Provider  acetaminophen (TYLENOL) 500 MG tablet Take 500 mg by mouth as needed.    Yes [provider]  amLODipine (NORVASC) 5 MG tablet Take 5 mg by mouth every morning.   Yes [provider]  chlorthalidone (HYGROTON) 25 MG tablet Take 25 mg by mouth daily.  11/06/18  Yes [provider]  metoprolol tartrate (LOPRESSOR) 25 MG tablet Take 25 mg by mouth  2 (two) times daily.    Yes [provider]  Multiple Vitamins-Minerals (MULTIVITAMIN WITH MINERALS) tablet Take 1 tablet by mouth daily.   Yes [provider]  olmesartan (BENICAR) 40 MG tablet Take 40 mg by mouth daily.  12/01/18  Yes [provider]  rivaroxaban (XARELTO) 20 MG TABS tablet Take 1 tablet (20 mg total) by mouth daily with supper. 02/01/19  Yes Kem Parkinson, PA-C   Physical Exam: Vitals:   02/03/19 1230 02/03/19 1300 02/03/19 1302 02/03/19 1330  BP: (!) 165/92 (!) 151/83 (!) 151/96 (!) 145/76  Pulse: (!) 122 (!) 102 (!) 137 (!) 103  Resp: (!) 25  12 19 11   Temp:      TempSrc:      SpO2: 100% 100% 100% 100%  Weight:      Height:         General exam: Moderately built and nourished patient, lying comfortably supine on the gurney in no obvious distress.  Head, eyes and ENT: Nontraumatic and normocephalic. Pupils equally reacting to light and accommodation. Oral mucosa moist.  Neck: Supple. No JVD, carotid bruit or thyromegaly.  Lymphatics: No lymphadenopathy.  Respiratory system: Clear to auscultation. No increased work of breathing.  Cardiovascular system: S1 and S2 heard, irregularly irregular. No JVD, murmurs, gallops, clicks or pedal edema.  Gastrointestinal system: Abdomen is nondistended, soft and nontender. Normal bowel sounds heard. No organomegaly or masses appreciated.  Central nervous system: Alert and oriented. No focal neurological deficits.  Extremities: Symmetric 5 x 5 power. Peripheral pulses symmetrically felt.  Trace pretibial edema bilateral lower extremities.  Skin: No rashes or acute findings.  Musculoskeletal system: Negative exam.  Psychiatry: Pleasant and cooperative.  Labs on Admission:  Basic Metabolic Panel: Recent Labs  Lab 02/01/19 1019 02/03/19 1042  NA 130* 131*  K 3.8 3.8  CL 92* 94*  CO2 28 25  GLUCOSE 104* 120*  BUN 14 16  CREATININE 0.71 0.75  CALCIUM 9.8 10.1  MG  --  1.8   Liver Function Tests: No results for input(s): AST, ALT, ALKPHOS, BILITOT, PROT, ALBUMIN in the last 168 hours. No results for input(s): LIPASE, AMYLASE in the last 168 hours. No results for input(s): AMMONIA in the last 168 hours. CBC: Recent Labs  Lab 02/01/19 1019 02/03/19 1042  WBC 5.0 6.3  NEUTROABS 3.5 4.8  HGB 14.8 14.1  HCT 41.8 38.9  MCV 92.9 92.8  PLT 172 167   Cardiac Enzymes: Recent Labs  Lab 02/01/19 1019 02/03/19 1042  TROPONINI <0.03 0.05*    BNP (last 3 results) No results for input(s): PROBNP in the last 8760 hours. CBG: No results for input(s): GLUCAP in the last  168 hours.  Radiological Exams on Admission: No results found.  EKG: Independently reviewed.   Assessment/Plan Principal Problem:   Atrial fibrillation with RVR (HCC) Active Problems:   History of hyperthyroidism   Essential hypertension   History of small thalamic hemorrhage with stroke 2015   Severe obesity (BMI >= 40) (HCC)   Hyperglycemia   1. Atrial fibrillation with RVR-the patient has failed electrical cardioversion that was done 2 days ago.  She has been compliant with taking her metoprolol and reports compliance with taking Xarelto for full anticoagulation.  She has been started on IV diltiazem and will be admitted to the stepdown unit for further management.  Titrate diltiazem to achieve rate control.  On restarting her metoprolol increasing to 50 mg twice daily.  We have continued the Xarelto that was  started 2 days ago for full anticoagulation.  Neurology was consulted 2 days ago and felt that there was no strong contraindication to her being fully anticoagulated because of the CVA from 2015.  I have asked the inpatient cardiology team to evaluate her tomorrow.  COVID-19 testing was negative. 2. History of hyperthyroidism-check TSH and free T4. 3. Essential hypertension-her blood pressures are poorly controlled at this time.  Monitor closely.  Given her history of hemorrhagic CVA will try to obtain better blood pressure control.  Increase metoprolol to 50 mg twice daily.  Holding other home blood pressure medications to allow room for beta-blocker titration. 4. Hyperglycemia-check hemoglobin A1c. 5. Cerebrovascular disease status post small thalamic hemorrhage in 2015- working hard to improve blood pressure control. 6. Elevated troponin- I suspect this is all secondary to demand ischemia from A. fib RVR, no chest pain symptoms at this time.  Cycle troponin.  Monitor telemetry and repeat EKG in a.m. 7. Hyponatremia-suspect this is secondary to HCTZ which is discontinued at this  time.  Follow BMP.  DVT Prophylaxis: Xarelto Code Status: Full Family Communication: I called and updated her husband per her request Disposition Plan: Inpatient admission to stepdown ICU  Critical care time spent: 45 minutes  Maloni Musleh Wynetta Emery, MD [facility] How to contact the [facility] Attending or Consulting provider [place] or covering provider during after hours [place], for this patient?  1. Check the care team in [facility] and look for a) attending/consulting [facility] provider listed and b) the [facility] team listed 2. Log into [email or web address] and use [place]'s universal password to access. If you do not have the password, please contact the hospital operator. 3. Locate the [facility] provider you are looking for under [facility] and page to a number that you can be directly reached. 4. If you still have difficulty reaching the provider, please page the [facility] (Director on Call) for the Hospitalists listed on amion for assistance.

## 2019-02-04 ENCOUNTER — Inpatient Hospital Stay (HOSPITAL_COMMUNITY): Payer: PPO

## 2019-02-04 DIAGNOSIS — I340 Nonrheumatic mitral (valve) insufficiency: Secondary | ICD-10-CM

## 2019-02-04 LAB — CBC WITH DIFFERENTIAL/PLATELET
Abs Immature Granulocytes: 0.05 10*3/uL (ref 0.00–0.07)
Basophils Absolute: 0 10*3/uL (ref 0.0–0.1)
Basophils Relative: 1 %
Eosinophils Absolute: 0.1 10*3/uL (ref 0.0–0.5)
Eosinophils Relative: 2 %
HCT: 39.5 % (ref 36.0–46.0)
Hemoglobin: 13.6 g/dL (ref 12.0–15.0)
Immature Granulocytes: 1 %
Lymphocytes Relative: 20 %
Lymphs Abs: 1.3 10*3/uL (ref 0.7–4.0)
MCH: 32.6 pg (ref 26.0–34.0)
MCHC: 34.4 g/dL (ref 30.0–36.0)
MCV: 94.7 fL (ref 80.0–100.0)
Monocytes Absolute: 0.7 10*3/uL (ref 0.1–1.0)
Monocytes Relative: 10 %
Neutro Abs: 4.4 10*3/uL (ref 1.7–7.7)
Neutrophils Relative %: 66 %
Platelets: 168 10*3/uL (ref 150–400)
RBC: 4.17 MIL/uL (ref 3.87–5.11)
RDW: 11.9 % (ref 11.5–15.5)
WBC: 6.5 10*3/uL (ref 4.0–10.5)
nRBC: 0 % (ref 0.0–0.2)

## 2019-02-04 LAB — COMPREHENSIVE METABOLIC PANEL
ALT: 26 U/L (ref 0–44)
AST: 30 U/L (ref 15–41)
Albumin: 4.3 g/dL (ref 3.5–5.0)
Alkaline Phosphatase: 89 U/L (ref 38–126)
Anion gap: 11 (ref 5–15)
BUN: 15 mg/dL (ref 8–23)
CO2: 28 mmol/L (ref 22–32)
Calcium: 9.5 mg/dL (ref 8.9–10.3)
Chloride: 90 mmol/L — ABNORMAL LOW (ref 98–111)
Creatinine, Ser: 0.75 mg/dL (ref 0.44–1.00)
GFR calc Af Amer: >60 mL/min (ref >60)
GFR calc non Af Amer: >60 mL/min (ref >60)
Glucose, Bld: 92 mg/dL (ref 70–99)
Potassium: 4 mmol/L (ref 3.5–5.1)
Sodium: 129 mmol/L — ABNORMAL LOW (ref 135–145)
Total Bilirubin: 1.7 mg/dL — ABNORMAL HIGH (ref 0.3–1.2)
Total Protein: 7.3 g/dL (ref 6.5–8.1)

## 2019-02-04 LAB — BRAIN NATRIURETIC PEPTIDE: B Natriuretic Peptide: 128 pg/mL — ABNORMAL HIGH (ref 0.0–100.0)

## 2019-02-04 LAB — LIPID PANEL
Cholesterol: 149 mg/dL (ref 0–200)
HDL: 71 mg/dL (ref >40)
LDL Cholesterol: 71 mg/dL (ref 0–99)
Total CHOL/HDL Ratio: 2.1 RATIO
Triglycerides: 35 mg/dL (ref <150)
VLDL: 7 mg/dL (ref 0–40)

## 2019-02-04 LAB — MAGNESIUM: Magnesium: 1.8 mg/dL (ref 1.7–2.4)

## 2019-02-04 LAB — ECHOCARDIOGRAM COMPLETE
Height: 67 in
Weight: 2303.37 oz

## 2019-02-04 LAB — HIV ANTIBODY (ROUTINE TESTING W REFLEX): HIV Screen 4th Generation wRfx: NONREACTIVE

## 2019-02-04 MED ORDER — FLECAINIDE ACETATE 50 MG PO TABS
300.0000 mg | ORAL_TABLET | Freq: Once | ORAL | Status: AC
  Administered 2019-02-04: 300 mg via ORAL
  Filled 2019-02-04: qty 3

## 2019-02-04 MED ORDER — DOPAMINE-DEXTROSE 3.2-5 MG/ML-% IV SOLN: 0.0000 ug/kg/min | INTRAVENOUS | Status: DC

## 2019-02-04 MED ORDER — ATROPINE SULFATE 1 MG/ML IJ SOLN: 0.4000 mg | INTRAMUSCULAR | Status: DC | PRN

## 2019-02-04 MED ORDER — GLUCAGON HCL RDNA (DIAGNOSTIC) 1 MG IJ SOLR
5.0000 mg | Freq: Once | INTRAVENOUS | Status: AC
  Administered 2019-02-04: 15:00:00 5 mg via INTRAVENOUS
  Filled 2019-02-04: qty 5

## 2019-02-04 MED ORDER — GLUCAGON HCL RDNA (DIAGNOSTIC) 1 MG IJ SOLR
INTRAMUSCULAR | Status: AC
  Filled 2019-02-04: qty 1

## 2019-02-04 MED ORDER — CALCIUM GLUCONATE-NACL 1-0.675 GM/50ML-% IV SOLN
1.0000 g | Freq: Once | INTRAVENOUS | Status: AC
  Administered 2019-02-04: 1000 mg via INTRAVENOUS
  Filled 2019-02-04: qty 50

## 2019-02-04 MED ORDER — GLUCAGON HCL RDNA (DIAGNOSTIC) 1 MG IJ SOLR: 1.0000 mg/h | INTRAVENOUS | Status: DC

## 2019-02-04 MED ORDER — DEXTROSE 50 % IV SOLN
INTRAVENOUS | Status: AC
  Filled 2019-02-04: qty 50

## 2019-02-04 MED ORDER — SODIUM CHLORIDE 0.9 % IV SOLN
INTRAVENOUS | Status: DC | PRN
  Administered 2019-02-04 (×2): via INTRAVENOUS

## 2019-02-04 NOTE — Progress Notes (Signed)
*  PRELIMINARY RESULTS* Echocardiogram 2D Echocardiogram has been performed.  Melissa [name] 02/04/2019, 4:09 PM

## 2019-02-04 NOTE — Progress Notes (Signed)
MD Manuella Ghazi notified of pharmacy's concern about lack of policy regarding peripheral IV administration of Dopamine. MD Manuella Ghazi has changed order to PRN IVP of Atropine if pt is symptomatic. Pt mentation as per normal, cap refill <3 seconds, pt denies N/, dizziness, lightheadedness, or sx at this time.

## 2019-02-04 NOTE — Progress Notes (Signed)
PROGRESS NOTE    Melissa [name]  ZJI:[id number] DOB: Oct 27, 1950 DOA: 02/03/2019 PCP: Asencion Noble, MD   Brief Narrative:  Per HPI: Melissa [name] is a 68 y.o. female with paroxysmal atrial fibrillation, history of small left thalamic hemorrhage in 2015 thought secondary to uncontrolled hypertension returned to ED today with symptoms of recurrent atrial fibrillation.  The patient was seen 2 days ago in the ED with A. fib RVR and was treated with an electrical cardioversion.  After consultation with neurology (Dr. Lorraine Lax) she was discharged on metoprolol 25 mg twice daily and started on rivaroxaban for full anticoagulation.  The patient reports that her heart rate at home was in the 120s.  She reports generalized weakness.  She denies chest pain but has had symptoms of shortness of breath.  She did feel the palpitations.  She says that she has been taking her medications and has not missed any doses.  Patient was admitted with atrial fibrillation with RVR in the setting of paroxysmal atrial fibrillation.  She is also noted to be hyponatremic from HCTZ use.  Assessment & Plan:   Principal Problem:   Atrial fibrillation with RVR (HCC) Active Problems:   History of hyperthyroidism   Essential hypertension   History of small thalamic hemorrhage with stroke 2015   Severe obesity (BMI >= 40) (HCC)   Hyperglycemia   1. Atrial fibrillation with RVR-the patient has failed electrical cardioversion that was done 2 days ago.  She has been compliant with taking her metoprolol and reports compliance with taking Xarelto for full anticoagulation.    Continue on IV diltiazem with flecainide to be given per cardiology.  Continue on metoprolol 50 mg twice a day as well.  COVID-19 testing noted to be negative.  2D echocardiogram pending. 2. History of hyperthyroidism- controlled. 3. Essential hypertension-her blood pressures are poorly controlled at this time.  Monitor closely.  Given her history of  hemorrhagic CVA will try to obtain better blood pressure control.  Increase metoprolol to 50 mg twice daily.  Holding other home blood pressure medications to allow room for beta-blocker titration. 4. Hyperglycemia- hemoglobin A1c 4.8%. 5. Cerebrovascular disease status post small thalamic hemorrhage in 2015- working hard to improve blood pressure control. 6. Elevated troponin- I suspect this is all secondary to demand ischemia from A. fib RVR, no chest pain symptoms at this time.  Cycle troponin.  Monitor telemetry and repeat EKG in a.m. 7. Hyponatremia-persistent-suspect this is secondary to HCTZ which is discontinued at this time.  Follow BMP.  Change to regular diet and fluid restriction ordered.   DVT prophylaxis: Xarelto Code Status: Full Family Communication: We will plan to call husband Disposition Plan: Continue plans per cardiology with flecainide to be given.  2D echocardiogram pending.   Consultants:   Cardiology  Procedures:   None  Antimicrobials:   None   Subjective: Patient seen and evaluated today with no new acute complaints or concerns. No acute concerns or events noted overnight.  She is noted to be anxious this morning and continues to remain in A. fib on Cardizem drip.  Objective: Vitals:   02/04/19 0945 02/04/19 1100 02/04/19 1115 02/04/19 1130  BP: 130/80 118/60 (!) 150/98 135/69  Pulse: 85 79 72 70  Resp: 16 14 17 19   Temp:   98.4 F (36.9 C)   TempSrc:   Oral   SpO2: 99% 99% 100% 100%  Weight:      Height:        Intake/Output Summary (  Last 24 hours) at 02/04/2019 1148 Last data filed at 02/04/2019 0524 Gross per 24 hour  Intake 1637.74 ml  Output 1200 ml  Net 437.74 ml   Filed Weights   02/03/19 1451 02/03/19 1454 02/04/19 0500  Weight: 63.6 kg 63.6 kg 65.3 kg    Examination:  General exam: Appears calm and comfortable  Respiratory system: Clear to auscultation. Respiratory effort normal. Cardiovascular system: S1 & S2 heard,  irregular. No JVD, murmurs, rubs, gallops or clicks. No pedal edema. Gastrointestinal system: Abdomen is nondistended, soft and nontender. No organomegaly or masses felt. Normal bowel sounds heard. Central nervous system: Alert and oriented. No focal neurological deficits. Extremities: Symmetric 5 x 5 power. Skin: No rashes, lesions or ulcers Psychiatry: Judgement and insight appear normal. Mood & affect appropriate.  Mildly anxious.    Data Reviewed: I have personally reviewed following labs and imaging studies  CBC: Recent Labs  Lab 02/01/19 1019 02/03/19 1042 02/04/19 0432  WBC 5.0 6.3 6.5  NEUTROABS 3.5 4.8 4.4  HGB 14.8 14.1 13.6  HCT 41.8 38.9 39.5  MCV 92.9 92.8 94.7  PLT 172 167 299   Basic Metabolic Panel: Recent Labs  Lab 02/01/19 1019 02/03/19 1042 02/04/19 0432  NA 130* 131* 129*  K 3.8 3.8 4.0  CL 92* 94* 90*  CO2 28 25 28   GLUCOSE 104* 120* 92  BUN 14 16 15   CREATININE 0.71 0.75 0.75  CALCIUM 9.8 10.1 9.5  MG  --  1.8 1.8   GFR: Estimated Creatinine Clearance: 65.5 mL/min (by C-G formula based on SCr of 0.75 mg/dL). Liver Function Tests: Recent Labs  Lab 02/04/19 0432  AST 30  ALT 26  ALKPHOS 89  BILITOT 1.7*  PROT 7.3  ALBUMIN 4.3   No results for input(s): LIPASE, AMYLASE in the last 168 hours. No results for input(s): AMMONIA in the last 168 hours. Coagulation Profile: No results for input(s): INR, PROTIME in the last 168 hours. Cardiac Enzymes: Recent Labs  Lab 02/01/19 1019 02/03/19 1042 02/03/19 1535 02/03/19 2044  TROPONINI <0.03 0.05* 0.05* 0.04*   BNP (last 3 results) No results for input(s): PROBNP in the last 8760 hours. HbA1C: Recent Labs    02/03/19 1535  HGBA1C 4.8   CBG: No results for input(s): GLUCAP in the last 168 hours. Lipid Profile: Recent Labs    02/04/19 0432  CHOL 149  HDL 71  LDLCALC 71  TRIG 35  CHOLHDL 2.1   Thyroid Function Tests: Recent Labs    02/03/19 1303 02/03/19 1535  TSH 2.058   --   FREET4  --  1.11   Anemia Panel: No results for input(s): VITAMINB12, FOLATE, FERRITIN, TIBC, IRON, RETICCTPCT in the last 72 hours. Sepsis Labs: No results for input(s): PROCALCITON, LATICACIDVEN in the last 168 hours.  Recent Results (from the past 240 hour(s))  SARS Coronavirus 2 (CEPHEID - Performed in [facility] hospital lab), Hosp Order     Status: None   Collection Time: 02/03/19 12:09 PM  Result Value Ref Range Status   SARS Coronavirus 2 NEGATIVE NEGATIVE Final    Comment: (NOTE) If result is NEGATIVE SARS-CoV-2 target nucleic acids are NOT DETECTED. The SARS-CoV-2 RNA is generally detectable in upper and lower  respiratory specimens during the acute phase of infection. The lowest  concentration of SARS-CoV-2 viral copies this assay can detect is 250  copies / mL. A negative result does not preclude SARS-CoV-2 infection  and should not be used as the sole basis for  treatment or other  patient management decisions.  A negative result may occur with  improper specimen collection / handling, submission of specimen other  than nasopharyngeal swab, presence of viral mutation(s) within the  areas targeted by this assay, and inadequate number of viral copies  (<250 copies / mL). A negative result must be combined with clinical  observations, patient history, and epidemiological information. If result is POSITIVE SARS-CoV-2 target nucleic acids are DETECTED. The SARS-CoV-2 RNA is generally detectable in upper and lower  respiratory specimens dur ing the acute phase of infection.  Positive  results are indicative of active infection with SARS-CoV-2.  Clinical  correlation with patient history and other diagnostic information is  necessary to determine patient infection status.  Positive results do  not rule out bacterial infection or co-infection with other viruses. If result is PRESUMPTIVE POSTIVE SARS-CoV-2 nucleic acids MAY BE PRESENT.   A presumptive positive result  was obtained on the submitted specimen  and confirmed on repeat testing.  While 2019 novel coronavirus  (SARS-CoV-2) nucleic acids may be present in the submitted sample  additional confirmatory testing may be necessary for epidemiological  and / or clinical management purposes  to differentiate between  SARS-CoV-2 and other Sarbecovirus currently known to infect humans.  If clinically indicated additional testing with an alternate test  methodology [phone]) is advised. The SARS-CoV-2 RNA is generally  detectable in upper and lower respiratory sp ecimens during the acute  phase of infection. The expected result is Negative. Fact Sheet for Patients:  StrictlyIdeas.no Fact Sheet for Healthcare Providers: BankingDealers.co.za This test is not yet approved or cleared by the [place] FDA and has been authorized for detection and/or diagnosis of SARS-CoV-2 by FDA under an Emergency Use Authorization (EUA).  This EUA will remain in effect (meaning this test can be used) for the duration of the COVID-19 declaration under Section 564(b)(1) of the Act, 21 U.S.C. section 360bbb-3(b)(1), unless the authorization is terminated or revoked sooner. Performed at [facility], [address], [place], [place] [address]   MRSA PCR Screening     Status: None   Collection Time: 02/03/19  4:43 PM  Result Value Ref Range Status   MRSA by PCR NEGATIVE NEGATIVE Final    Comment:        The GeneXpert MRSA Assay (FDA approved for NASAL specimens only), is one component of a comprehensive MRSA colonization surveillance program. It is not intended to diagnose MRSA infection nor to guide or monitor treatment for MRSA infections. Performed at [facility], [address] [place], [place] [address]          Radiology Studies: No results found.      Scheduled Meds: . flecainide  300 mg Oral Once  . irbesartan  300 mg Oral Daily  . multivitamin  with minerals  1 tablet Oral Daily  . rivaroxaban  20 mg Oral Q supper   Continuous Infusions: . diltiazem (CARDIZEM) infusion 5 mg/hr (02/04/19 0524)     LOS: 1 day    Time spent: 30 minutes    Theodus Ran Darleen Crocker, DO [facility] Pager [phone]  If 7PM-7AM, please contact night-coverage [email or web address] Password [email or web address] 02/04/2019, 11:48 AM

## 2019-02-04 NOTE — Progress Notes (Signed)
Per MD Nishan give 300mg  Flecanide as a one time loading dose. MD did not want to stop Cardizem prior to PO medication. Per MD stop Cardizem after pt had converted and remained in NSR for 2 hours.

## 2019-02-04 NOTE — Consult Note (Signed)
Cardiology Consultation:   Patient ID: [name] MRN: [id number]; DOB: 15-Nov-1950  Admit date: 02/03/2019 Date of Consult: 02/04/2019  Primary Care Provider: Asencion Noble, MD Primary Cardiologist: Kate Sable, MD   Primary Electrophysiologist:  Will be Lovena Le    Patient Profile:   Melissa [name] is a 68 y.o. female with a hx of PAF who is being seen today for the evaluation of PAF with rapid rates at the request of Dr Wynetta Emery.  History of Present Illness:   68 y.o. previously worked with Dr Arnoldo Morale at [facility]. History of PAF first seen for this by Bernerd Pho PA 09/17/17. TTE has shown normal EF without LVH and mild bi-atrial enlargement 09/05/17 No CAD with normal ETT 09/30/17. She has had a CVA in 2015 but has been cleared to be on anticoagulation by neuro. Seen in [facility] ER 5/3 with sudden onset rapid afib and DCC with single 150 J biphasic shock Started on xarelto and d/c with metoprolol 25 bid and xarelto 20 mg daily. Presents back today with ERAF rapid rate Refused [facility] again and admitted for further w/u and Rx. She is currently comfortable in ICU with 5mg  cardizem drip and afib rates 80-90 bpm. She had xarelto last night No chest pain or dyspnea She only has mild memory issues from previous stroke Long discussion with her about need for AAT and using "pill in pocket" flecainide now while monitored in hospital and continued Rx 50 bid if successful. Risks including stroke, ventricular arrhytmia and bradycardia discussed willing to proceed. Primary service has ordered f/u echo that has not been done yet.   Past Medical History:  Diagnosis Date   Atrial fibrillation ([place])    CVA (cerebral infarction) 11/2013   aphasia   Hypertension    Hyperthyroidism    remote   Uterine cancer [facility])     Past Surgical History:  Procedure Laterality Date   ABDOMINAL HYSTERECTOMY     CHOLECYSTECTOMY     COLONOSCOPY N/A 05/12/2014   Procedure: COLONOSCOPY;  Surgeon: Daneil Dolin, MD;  Location: [facility] ENDO [facility]ITE;  Service: Endoscopy;  Laterality: N/A;  1130   COLONOSCOPY N/A 07/26/2017   Procedure: COLONOSCOPY;  Surgeon: Daneil Dolin, MD;  Location: [facility] ENDO SUITE;  Service: Endoscopy;  Laterality: N/A;  1:00 pm   REPLACEMENT TOTAL KNEE BILATERAL     bilateral knees replaced twice     Home Medications:  Prior to Admission medications   Medication Sig Start Date End Date Taking? Authorizing Provider  acetaminophen (TYLENOL) 500 MG tablet Take 500 mg by mouth as needed.    Yes [provider]  amLODipine (NORVASC) 5 MG tablet Take 5 mg by mouth every morning.   Yes [provider]  chlorthalidone (HYGROTON) 25 MG tablet Take 25 mg by mouth daily.  11/06/18  Yes [provider]  metoprolol tartrate (LOPRESSOR) 25 MG tablet Take 25 mg by mouth 2 (two) times daily.    Yes [provider]  Multiple Vitamins-Minerals (MULTIVITAMIN WITH MINERALS) tablet Take 1 tablet by mouth daily.   Yes [provider]  olmesartan (BENICAR) 40 MG tablet Take 40 mg by mouth daily.  12/01/18  Yes [provider]  rivaroxaban (XARELTO) 20 MG TABS tablet Take 1 tablet (20 mg total) by mouth daily with supper. 02/01/19  Yes Triplett, Tammy, PA-C    Inpatient Medications: Scheduled Meds:  flecainide  300 mg Oral Once   irbesartan  300 mg Oral Daily   metoprolol tartrate  50 mg Oral BID   multivitamin with minerals  1 tablet Oral Daily   rivaroxaban  20 mg Oral Q supper   Continuous Infusions:  diltiazem (CARDIZEM) infusion 5 mg/hr (02/04/19 0524)   PRN Meds: acetaminophen, ALPRAZolam, ondansetron (ZOFRAN) IV  Allergies:   No Known Allergies  Social History:   Social History   Socioeconomic History   Marital status: Married    Spouse name: Not on file   Number of children: 1   Years of education: college   Highest education level: Not on file  Occupational History   Occupation: s    Employer: [employer] resource strain: Not on file   Food insecurity:    Worry: Not on file    Inability: Not on file   Transportation needs:    Medical: Not on file    Non-medical: Not on file  Tobacco Use   Smoking status: Never Smoker   Smokeless tobacco: Never Used   Tobacco comment: Never smoked  Substance and Sexual Activity   Alcohol use: No    Alcohol/week: 0.0 standard drinks   Drug use: No   Sexual activity: Not on file  Lifestyle   Physical activity:    Days per week: Not on file    Minutes per session: Not on file   Stress: Not on file  Relationships   Social connections:    Talks on phone: Not on file    Gets together: Not on file    Attends religious service: Not on file    Active member of club or organization: Not on file    Attends meetings of clubs or organizations: Not on file    Relationship status: Not on file   Intimate partner violence:    Fear of current or ex partner: Not on file    Emotionally abused: Not on file    Physically abused: Not on file    Forced sexual activity: Not on file  Other Topics Concern   Not on file  Social History Narrative   Patient lives at home with her husband.   Patient drinks soda and coffee diet.    Family History:    Family History  Problem Relation Age of Onset   CVA Maternal Grandfather 59   Colon cancer Neg Hx      ROS:  Please see the history of present illness.   All other ROS reviewed and negative.     Physical Exam/Data:   Vitals:   02/04/19 0902 02/04/19 0945 02/04/19 1100 02/04/19 1115  BP: 134/84 130/80 118/60 (!) 150/98  Pulse: 89 85 79 72  Resp:  16 14 17   Temp:    98.4 F (36.9 C)  TempSrc:    Oral  SpO2:  99% 99% 100%  Weight:      Height:        Intake/Output Summary (Last 24 hours) at 02/04/2019 1128 Last data filed at 02/04/2019 0524 Gross per 24 hour  Intake 1637.74 ml  Output 1200 ml  Net 437.74 ml   Last 3 Weights 02/04/2019 02/03/2019 02/03/2019  Weight  (lbs) 143 lb 15.4 oz 140 lb 3.4 oz 140 lb 3.4 oz  Weight (kg) 65.3 kg 63.6 kg 63.6 kg     Body mass index is 22.55 kg/m.  General:  Well nourished, well developed, in no acute distress  HEENT: normal Lymph: no adenopathy Neck: no JVD Endocrine:  No thryomegaly Vascular: No carotid bruits; FA pulses 2+  bilaterally without bruits  Cardiac:  normal S1, S2; RRR; no murmur   Lungs:  clear to auscultation bilaterally, no wheezing, rhonchi or rales  Abd: soft, nontender, no hepatomegaly  Ext: no edema Musculoskeletal:  No deformities, BUE and BLE strength normal and equal Skin: warm and dry  Neuro:  CNs 2-12 intact, no focal abnormalities noted Psych:  Normal affect   EKG:  Afib no acute changes normal QT Telemetry:  Telemetry was personally reviewed and demonstrates:  afib rate 80-90 bpm  Relevant CV Studies: TTE EF 55-60% no LVH mild bi atrial enlargement 09/05/17 ETT normal 09/30/17  Laboratory Data:  Chemistry Recent Labs  Lab 02/01/19 1019 02/03/19 1042 02/04/19 0432  NA 130* 131* 129*  K 3.8 3.8 4.0  CL 92* 94* 90*  CO2 28 25 28   GLUCOSE 104* 120* 92  BUN 14 16 15   CREATININE 0.71 0.75 0.75  CALCIUM 9.8 10.1 9.5  GFRNONAA >60 >60 >60  GFRAA >60 >60 >60  ANIONGAP 10 12 11     Recent Labs  Lab 02/04/19 0432  PROT 7.3  ALBUMIN 4.3  AST 30  ALT 26  ALKPHOS 89  BILITOT 1.7*   Hematology Recent Labs  Lab 02/01/19 1019 02/03/19 1042 02/04/19 0432  WBC 5.0 6.3 6.5  RBC 4.50 4.19 4.17  HGB 14.8 14.1 13.6  HCT 41.8 38.9 39.5  MCV 92.9 92.8 94.7  MCH 32.9 33.7 32.6  MCHC 35.4 36.2* 34.4  RDW 11.9 11.9 11.9  PLT 172 167 168   Cardiac Enzymes Recent Labs  Lab 02/01/19 1019 02/03/19 1042 02/03/19 1535 02/03/19 2044  TROPONINI <0.03 0.05* 0.05* 0.04*   No results for input(s): TROPIPOC in the last 168 hours.  BNP Recent Labs  Lab 02/04/19 0432  BNP 128.0*    DDimer No results for input(s): DDIMER in the last 168 hours.  Radiology/Studies:  Ct  Head Wo Contrast  Result Date: 02/01/2019 CLINICAL DATA:  Stroke follow-up.  Patient presents with tachycardia EXAM: CT HEAD WITHOUT CONTRAST TECHNIQUE: Contiguous axial images were obtained from the base of the skull through the vertex without intravenous contrast. COMPARISON:  Brain MRI 12/04/2013 FINDINGS: Brain: No evidence of acute infarction, hemorrhage, hydrocephalus, extra-axial collection or mass lesion/mass effect. Moderate to extensive chronic small vessel ischemic change in the cerebral white matter. Blunted left thalamus where there was hemorrhage remotely. Vascular: No hyperdense vessel or unexpected calcification. Skull: Normal. Negative for fracture or focal lesion. Sinuses/Orbits: No acute finding. IMPRESSION: 1. No acute finding. 2. Chronic small vessel ischemia. Electronically Signed   By: Monte Fantasia M.D.   On: 02/01/2019 12:08   Dg Chest Portable 1 View  Result Date: 02/01/2019 CLINICAL DATA:  Heart racing EXAM: PORTABLE CHEST 1 VIEW COMPARISON:  10/07/2018 FINDINGS: Normal heart size and mediastinal contours. There is no edema, consolidation, effusion, or pneumothorax. No acute osseous finding IMPRESSION: No evidence of active disease. Electronically Signed   By: Monte Fantasia M.D.   On: 02/01/2019 10:42    Assessment and Plan:   1. PAF:  Will continue xarelto will continue cardizem 5mg  drip. Flecainide one time dose to try to convert 300mg  . Monitor in ICU. If successful start fleainide 50 bid in am Echo to be done latter today. Will d/c cardizem drip 2 hours after conversion or sooner if HR in NSR is slow.  2. CVA:  Distant 2015 no residual deficits cleared by neuro for anticoagulation 3. HTN:  Will hold lopressor while on cardizem drip to avoid post conversion  bradycardia she is on avapro as well       For questions or updates, please contact [place] Please consult [email or web address] for contact info under     Signed, Jenkins Rouge, MD  02/04/2019 11:28 AM

## 2019-02-04 NOTE — Progress Notes (Signed)
Notified of conversion to sinus rhythm earlier this afternoon, but patient progressively developed some asymptomatic bradycardia.  EKG now reveals first-degree AV block.  Patient is still asymptomatic and has stable blood pressure readings noted.  Pacers are at bedside and 1 g of calcium gluconate given initially.  We will also add glucagon at this time and consider infusion as needed.  Hold all other antihypertensive agents at this time and monitor closely.

## 2019-02-04 NOTE — Care Management Important Message (Signed)
Important Message  Patient Details  Name: Melissa [name] MRN: [id number] Date of Birth: [birth year]   Medicare Important Message Given:  Yes    Tommy Medal 02/04/2019, 2:58 PM

## 2019-02-04 NOTE — Progress Notes (Signed)
MD Manuella Ghazi notified of pt's HR of 44 and BP 108/57, pt denies being symptomatic at this time. Cardizem was stopped and further orders will be placed.

## 2019-02-05 DIAGNOSIS — I480 Paroxysmal atrial fibrillation: Principal | ICD-10-CM

## 2019-02-05 LAB — CBC WITH DIFFERENTIAL/PLATELET
Abs Immature Granulocytes: 0.05 10*3/uL (ref 0.00–0.07)
Basophils Absolute: 0 10*3/uL (ref 0.0–0.1)
Basophils Relative: 1 %
Eosinophils Absolute: 0.2 10*3/uL (ref 0.0–0.5)
Eosinophils Relative: 3 %
HCT: 35.2 % — ABNORMAL LOW (ref 36.0–46.0)
Hemoglobin: 12 g/dL (ref 12.0–15.0)
Immature Granulocytes: 1 %
Lymphocytes Relative: 23 %
Lymphs Abs: 1.6 10*3/uL (ref 0.7–4.0)
MCH: 33 pg (ref 26.0–34.0)
MCHC: 34.1 g/dL (ref 30.0–36.0)
MCV: 96.7 fL (ref 80.0–100.0)
Monocytes Absolute: 0.8 10*3/uL (ref 0.1–1.0)
Monocytes Relative: 11 %
Neutro Abs: 4.3 10*3/uL (ref 1.7–7.7)
Neutrophils Relative %: 61 %
Platelets: 141 10*3/uL — ABNORMAL LOW (ref 150–400)
RBC: 3.64 MIL/uL — ABNORMAL LOW (ref 3.87–5.11)
RDW: 11.9 % (ref 11.5–15.5)
WBC: 6.9 10*3/uL (ref 4.0–10.5)
nRBC: 0 % (ref 0.0–0.2)

## 2019-02-05 LAB — COMPREHENSIVE METABOLIC PANEL
ALT: 23 U/L (ref 0–44)
AST: 24 U/L (ref 15–41)
Albumin: 4 g/dL (ref 3.5–5.0)
Alkaline Phosphatase: 84 U/L (ref 38–126)
Anion gap: 11 (ref 5–15)
BUN: 28 mg/dL — ABNORMAL HIGH (ref 8–23)
CO2: 27 mmol/L (ref 22–32)
Calcium: 9.3 mg/dL (ref 8.9–10.3)
Chloride: 91 mmol/L — ABNORMAL LOW (ref 98–111)
Creatinine, Ser: 0.97 mg/dL (ref 0.44–1.00)
GFR calc Af Amer: >60 mL/min (ref >60)
GFR calc non Af Amer: >60 mL/min (ref >60)
Glucose, Bld: 79 mg/dL (ref 70–99)
Potassium: 3.5 mmol/L (ref 3.5–5.1)
Sodium: 129 mmol/L — ABNORMAL LOW (ref 135–145)
Total Bilirubin: 1.4 mg/dL — ABNORMAL HIGH (ref 0.3–1.2)
Total Protein: 6.4 g/dL — ABNORMAL LOW (ref 6.5–8.1)

## 2019-02-05 LAB — MAGNESIUM: Magnesium: 1.8 mg/dL (ref 1.7–2.4)

## 2019-02-05 LAB — BRAIN NATRIURETIC PEPTIDE: B Natriuretic Peptide: 42 pg/mL (ref 0.0–100.0)

## 2019-02-05 MED ORDER — METOPROLOL SUCCINATE ER 25 MG PO TB24: 12.5000 mg | ORAL_TABLET | Freq: Every day | ORAL | 11 refills | Status: DC

## 2019-02-05 MED ORDER — FLECAINIDE ACETATE 50 MG PO TABS
50.0000 mg | ORAL_TABLET | Freq: Two times a day (BID) | ORAL | Status: DC
  Administered 2019-02-05: 10:00:00 50 mg via ORAL
  Filled 2019-02-05 (×3): qty 1

## 2019-02-05 MED ORDER — FLECAINIDE ACETATE 50 MG PO TABS: 50.0000 mg | ORAL_TABLET | Freq: Two times a day (BID) | ORAL | 2 refills | Status: DC

## 2019-02-05 MED ORDER — ALPRAZOLAM 0.25 MG PO TABS: 0.2500 mg | ORAL_TABLET | Freq: Three times a day (TID) | ORAL | 0 refills | Status: AC | PRN

## 2019-02-05 NOTE — Discharge Instructions (Signed)
YOUR CARDIOLOGY TEAM HAS ARRANGED FOR AN E-VISIT FOR YOUR APPOINTMENT - PLEASE REVIEW IMPORTANT INFORMATION BELOW SEVERAL DAYS PRIOR TO YOUR APPOINTMENT  Due to the recent COVID-19 pandemic, we are transitioning in-person office visits to tele-medicine visits in an effort to decrease unnecessary exposure to our patients, their families, and staff. These visits are billed to your insurance just like a normal visit is. We also encourage you to sign up for MyChart if you have not already done so. You will need a smartphone if possible. For patients that do not have this, we can still complete the visit using a regular telephone but do prefer a smartphone to enable video when possible. You may have a family member that lives with you that can help. If possible, we also ask that you have a blood pressure cuff and scale at home to measure your blood pressure, heart rate and weight prior to your scheduled appointment. Patients with clinical needs that need an in-person evaluation and testing will still be able to come to the office if absolutely necessary. If you have any questions, feel free to call our office.     IF USING DOXIMITY or [email or web address] - The staff will give you instructions on receiving your link to join the meeting the day of your visit.      2-3 DAYS BEFORE YOUR APPOINTMENT  You will receive a telephone call from one of our [facility] team members - your caller ID may say "Unknown caller." If this is a video visit, we will walk you through how to get the video launched on your phone. We will remind you check your blood pressure, heart rate and weight prior to your scheduled appointment. If you have an Apple Watch or Kardia, please upload any pertinent ECG strips the day before or morning of your appointment to [facility]. Our staff will also make sure you have reviewed the consent and agree to move forward with your scheduled tele-health visit.     THE DAY OF YOUR APPOINTMENT  Approximately 15  minutes prior to your scheduled appointment, you will receive a telephone call from one of [facility] team - your caller ID may say "Unknown caller."  Our staff will confirm medications, vital signs for the day and any symptoms you may be experiencing. Please have this information available prior to the time of visit start. It may also be helpful for you to have a pad of paper and pen handy for any instructions given during your visit. They will also walk you through joining the smartphone meeting if this is a video visit.    CONSENT FOR TELE-HEALTH VISIT - PLEASE REVIEW  I hereby voluntarily request, consent and authorize [facility] and its employed or contracted physicians, physician assistants, nurse practitioners or other licensed health care professionals (the Practitioner), to provide me with telemedicine health care services (the Services") as deemed necessary by the treating Practitioner. I acknowledge and consent to receive the Services by the Practitioner via telemedicine. I understand that the telemedicine visit will involve communicating with the Practitioner through live audiovisual communication technology and the disclosure of certain medical information by electronic transmission. I acknowledge that I have been given the opportunity to request an in-person assessment or other available alternative prior to the telemedicine visit and am voluntarily participating in the telemedicine visit.  I understand that I have the right to withhold or withdraw my consent to the use of telemedicine in the course of my care at any time, without affecting my  right to future care or treatment, and that the Practitioner or I may terminate the telemedicine visit at any time. I understand that I have the right to inspect all information obtained and/or recorded in the course of the telemedicine visit and may receive copies of available information for a reasonable fee.  I understand that some of the potential  risks of receiving the Services via telemedicine include:   Delay or interruption in medical evaluation due to technological equipment failure or disruption;  Information transmitted may not be sufficient (e.g. poor resolution of images) to allow for appropriate medical decision making by the Practitioner; and/or   In rare instances, security protocols could fail, causing a breach of personal health information.  Furthermore, I acknowledge that it is my responsibility to provide information about my medical history, conditions and care that is complete and accurate to the best of my ability. I acknowledge that Practitioner's advice, recommendations, and/or decision may be based on factors not within their control, such as incomplete or inaccurate data provided by me or distortions of diagnostic images or specimens that may result from electronic transmissions. I understand that the practice of medicine is not an exact science and that Practitioner makes no warranties or guarantees regarding treatment outcomes. I acknowledge that I will receive a copy of this consent concurrently upon execution via email to the email address I last provided but may also request a printed copy by calling the office of [facility].    I understand that my insurance will be billed for this visit.   I have read or had this consent read to me.  I understand the contents of this consent, which adequately explains the benefits and risks of the Services being provided via telemedicine.   I have been provided ample opportunity to ask questions regarding this consent and the Services and have had my questions answered to my satisfaction.  I give my informed consent for the services to be provided through the use of telemedicine in my medical care  By participating in this telemedicine visit I agree to the above.

## 2019-02-05 NOTE — Discharge Summary (Signed)
Physician Discharge Summary  Melissa [name]:[id number] DOB: [birth year] DOA: 02/03/2019  PCP: Asencion Noble, MD  Admit date: 02/03/2019  Discharge date: 02/05/2019  Admitted From:Home  Disposition:  Home  Recommendations for Outpatient Follow-up:  1. Follow up with PCP in 1-2 weeks 2. Continue on medications as currently prescribed and follow-up with telehealth visit with cardiology in 1 week 3. Hold home HCTZ and amlodipine 4. Repeat BMP in 1 week to reassess sodium level  Home Health: None  Equipment/Devices: None  Discharge Condition: Stable  CODE STATUS: Full  Diet recommendation: Heart Healthy  Brief/Interim Summary:Per HPI: Melissa [name] a 68 y.o.femalewith paroxysmal atrial fibrillation, history of small left thalamic hemorrhage in 2015 thought secondary to uncontrolled hypertension returned toED today with symptoms of recurrent atrial fibrillation. The patient was seen 2 days ago in the ED with A. fib RVR and was treated with an electrical cardioversion. After consultation with neurology (Dr. Riki Rusk was discharged on metoprolol 25 mg twice daily and started on rivaroxaban for full anticoagulation.  The patient reports that her heart rate at home was in the 120s. She reportsgeneralized weakness. She denieschest pain but has had symptoms of shortness of breath. She did feel the palpitations.She says that she has been taking her medications and has not missed any doses.  Patient was admitted with atrial fibrillation with RVR in the setting of paroxysmal atrial fibrillation.  She is also noted to be hyponatremic from HCTZ use.  Patient remained on Cardizem drip on 5/6 and was given a 300 mg dose of flecainide which converted her to sinus rhythm.  This was subsequently followed by some bradycardia with rates in the high 30 bpm range.  She was given some calcium gluconate as well as some glucagon with improvement noted.  She was asymptomatic the entire time  and blood pressures remained stable.  She did not require any pacing or atropine overnight and was seen by cardiology this morning with recommendations to continue on flecainide 50 mg twice daily as well as home Benicar and start home Toprol-XL 12.5 mg daily.  She will remain off of her home hydrochlorothiazide on account of some hyponatremia as well as low blood pressure readings and will also remain off of her home amlodipine at this time.  2D echocardiogram was performed on 5/6 with LVEF of 60 to 65% noted and is similar to her prior echocardiogram in 2018.  She is otherwise stable for discharge.  She will follow-up with cardiology in 1 week with telehealth visit and will also require repeat BMP in 1 week to ensure her sodium levels are stable.   Discharge Diagnoses:  Principal Problem:   Atrial fibrillation with RVR ([facility]) Active Problems:   History of hyperthyroidism   Essential hypertension   History of small thalamic hemorrhage with stroke 2015   Severe obesity (BMI >= 40) (HCC)   Hyperglycemia  Principal discharge diagnosis: Atrial fibrillation with RVR in the setting of failed electrical cardioversion.  Discharge Instructions  Discharge Instructions    Diet - low sodium heart healthy   Complete by:  As directed    Increase activity slowly   Complete by:  As directed      Allergies as of 02/05/2019   No Known Allergies     Medication List    STOP taking these medications   amLODipine 5 MG tablet Commonly known as:  NORVASC   chlorthalidone 25 MG tablet Commonly known as:  HYGROTON   metoprolol tartrate 25 MG tablet Commonly  known as:  LOPRESSOR     TAKE these medications   acetaminophen 500 MG tablet Commonly known as:  TYLENOL Take 500 mg by mouth as needed.   ALPRAZolam 0.25 MG tablet Commonly known as:  XANAX Take 1 tablet (0.25 mg total) by mouth 3 (three) times daily as needed for anxiety or sleep.   flecainide 50 MG tablet Commonly known as:   TAMBOCOR Take 1 tablet (50 mg total) by mouth every 12 (twelve) hours for 30 days.   metoprolol succinate 25 MG 24 hr tablet Commonly known as:  Toprol XL Take 0.5 tablets (12.5 mg total) by mouth daily.   multivitamin with minerals tablet Take 1 tablet by mouth daily.   olmesartan 40 MG tablet Commonly known as:  BENICAR Take 40 mg by mouth daily.   rivaroxaban 20 MG Tabs tablet Commonly known as:  XARELTO Take 1 tablet (20 mg total) by mouth daily with supper.      Follow-up Information    Asencion Noble, MD Follow up in 1 week(s).   Specialty:  Internal Medicine Contact information: [address] [place] [address] [phone]        Erma Heritage, [place] .   Specialties:  Physician Assistant, Cardiology Contact information: [address] [place] [address] [phone]          No Known Allergies  Consultations:  Cardiology   Procedures/Studies: Ct Head Wo Contrast  Result Date: 02/01/2019 CLINICAL DATA:  Stroke follow-up.  Patient presents with tachycardia EXAM: CT HEAD WITHOUT CONTRAST TECHNIQUE: Contiguous axial images were obtained from the base of the skull through the vertex without intravenous contrast. COMPARISON:  Brain MRI 12/04/2013 FINDINGS: Brain: No evidence of acute infarction, hemorrhage, hydrocephalus, extra-axial collection or mass lesion/mass effect. Moderate to extensive chronic small vessel ischemic change in the cerebral white matter. Blunted left thalamus where there was hemorrhage remotely. Vascular: No hyperdense vessel or unexpected calcification. Skull: Normal. Negative for fracture or focal lesion. Sinuses/Orbits: No acute finding. IMPRESSION: 1. No acute finding. 2. Chronic small vessel ischemia. Electronically Signed   By: Monte Fantasia M.D.   On: 02/01/2019 12:08   Dg Chest Portable 1 View  Result Date: 02/01/2019 CLINICAL DATA:  Heart racing EXAM: PORTABLE CHEST 1 VIEW COMPARISON:  10/07/2018 FINDINGS: Normal  heart size and mediastinal contours. There is no edema, consolidation, effusion, or pneumothorax. No acute osseous finding IMPRESSION: No evidence of active disease. Electronically Signed   By: Monte Fantasia M.D.   On: 02/01/2019 10:42     Discharge Exam: Vitals:   02/05/19 0800 02/05/19 0830  BP: 126/65   Pulse: 60 68  Resp: 14 14  Temp:    SpO2: 99% 97%   Vitals:   02/05/19 0600 02/05/19 0700 02/05/19 0800 02/05/19 0830  BP: (!) 118/58 130/72 126/65   Pulse: (!) 51 (!) 53 60 68  Resp: 11 13 14 14   Temp:      TempSrc:      SpO2: 99% 96% 99% 97%  Weight:      Height:        General: Pt is alert, awake, not in acute distress Cardiovascular: RRR, S1/S2 +, no rubs, no gallops Respiratory: CTA bilaterally, no wheezing, no rhonchi Abdominal: Soft, NT, ND, bowel sounds + Extremities: no edema, no cyanosis    The results of significant diagnostics from this hospitalization (including imaging, microbiology, ancillary and laboratory) are listed below for reference.     Microbiology: Recent Results (from the past  240 hour(s))  SARS Coronavirus 2 (CEPHEID - Performed in [facility] hospital lab), Hosp Order     Status: None   Collection Time: 02/03/19 12:09 PM  Result Value Ref Range Status   SARS Coronavirus 2 NEGATIVE NEGATIVE Final    Comment: (NOTE) If result is NEGATIVE SARS-CoV-2 target nucleic acids are NOT DETECTED. The SARS-CoV-2 RNA is generally detectable in upper and lower  respiratory specimens during the acute phase of infection. The lowest  concentration of SARS-CoV-2 viral copies this assay can detect is 250  copies / mL. A negative result does not preclude SARS-CoV-2 infection  and should not be used as the sole basis for treatment or other  patient management decisions.  A negative result may occur with  improper specimen collection / handling, submission of specimen other  than nasopharyngeal swab, presence of viral mutation(s) within the  areas targeted  by this assay, and inadequate number of viral copies  (<250 copies / mL). A negative result must be combined with clinical  observations, patient history, and epidemiological information. If result is POSITIVE SARS-CoV-2 target nucleic acids are DETECTED. The SARS-CoV-2 RNA is generally detectable in upper and lower  respiratory specimens dur ing the acute phase of infection.  Positive  results are indicative of active infection with SARS-CoV-2.  Clinical  correlation with patient history and other diagnostic information is  necessary to determine patient infection status.  Positive results do  not rule out bacterial infection or co-infection with other viruses. If result is PRESUMPTIVE POSTIVE SARS-CoV-2 nucleic acids MAY BE PRESENT.   A presumptive positive result was obtained on the submitted specimen  and confirmed on repeat testing.  While 2019 novel coronavirus  (SARS-CoV-2) nucleic acids may be present in the submitted sample  additional confirmatory testing may be necessary for epidemiological  and / or clinical management purposes  to differentiate between  SARS-CoV-2 and other Sarbecovirus currently known to infect humans.  If clinically indicated additional testing with an alternate test  methodology [phone]) is advised. The SARS-CoV-2 RNA is generally  detectable in upper and lower respiratory sp ecimens during the acute  phase of infection. The expected result is Negative. Fact Sheet for Patients:  StrictlyIdeas.no Fact Sheet for Healthcare Providers: BankingDealers.co.za This test is not yet approved or cleared by the [place] FDA and has been authorized for detection and/or diagnosis of SARS-CoV-2 by FDA under an Emergency Use Authorization (EUA).  This EUA will remain in effect (meaning this test can be used) for the duration of the COVID-19 declaration under Section 564(b)(1) of the Act, 21 U.S.C. section  360bbb-3(b)(1), unless the authorization is terminated or revoked sooner. Performed at [facility], [address], [place], [place] [address]   MRSA PCR Screening     Status: None   Collection Time: 02/03/19  4:43 PM  Result Value Ref Range Status   MRSA by PCR NEGATIVE NEGATIVE Final    Comment:        The GeneXpert MRSA Assay (FDA approved for NASAL specimens only), is one component of a comprehensive MRSA colonization surveillance program. It is not intended to diagnose MRSA infection nor to guide or monitor treatment for MRSA infections. Performed at [facility], [address] [place], [place] [address]      Labs: BNP (last 3 results) Recent Labs    02/04/19 0432 02/05/19 0444  BNP 128.0* 73.4   Basic Metabolic Panel: Recent Labs  Lab 02/01/19 1019 02/03/19 1042 02/04/19 0432 02/05/19 0444  NA  130* 131* 129* 129*  K 3.8 3.8 4.0 3.5  CL 92* 94* 90* 91*  CO2 28 25 28 27   GLUCOSE 104* 120* 92 79  BUN 14 16 15  28*  CREATININE 0.71 0.75 0.75 0.97  CALCIUM 9.8 10.1 9.5 9.3  MG  --  1.8 1.8 1.8   Liver Function Tests: Recent Labs  Lab 02/04/19 0432 02/05/19 0444  AST 30 24  ALT 26 23  ALKPHOS 89 84  BILITOT 1.7* 1.4*  PROT 7.3 6.4*  ALBUMIN 4.3 4.0   No results for input(s): LIPASE, AMYLASE in the last 168 hours. No results for input(s): AMMONIA in the last 168 hours. CBC: Recent Labs  Lab 02/01/19 1019 02/03/19 1042 02/04/19 0432 02/05/19 0444  WBC 5.0 6.3 6.5 6.9  NEUTROABS 3.5 4.8 4.4 4.3  HGB 14.8 14.1 13.6 12.0  HCT 41.8 38.9 39.5 35.2*  MCV 92.9 92.8 94.7 96.7  PLT 172 167 168 141*   Cardiac Enzymes: Recent Labs  Lab 02/01/19 1019 02/03/19 1042 02/03/19 1535 02/03/19 2044  TROPONINI <0.03 0.05* 0.05* 0.04*   BNP: Invalid input(s): POCBNP CBG: No results for input(s): GLUCAP in the last 168 hours. D-Dimer No results for input(s): DDIMER in the last 72 hours. Hgb A1c Recent Labs    02/03/19 1535  HGBA1C 4.8   Lipid  Profile Recent Labs    02/04/19 0432  CHOL 149  HDL 71  LDLCALC 71  TRIG 35  CHOLHDL 2.1   Thyroid function studies Recent Labs    02/03/19 1303  TSH 2.058   Anemia work up No results for input(s): VITAMINB12, FOLATE, FERRITIN, TIBC, IRON, RETICCTPCT in the last 72 hours. Urinalysis    Component Value Date/Time   COLORURINE RED (A) 04/02/2014 0310   APPEARANCEUR HAZY (A) 04/02/2014 0310   LABSPEC 1.010 04/02/2014 0310   PHURINE 6.0 04/02/2014 0310   GLUCOSEU NEGATIVE 04/02/2014 0310   HGBUR LARGE (A) 04/02/2014 0310   BILIRUBINUR NEGATIVE 04/02/2014 0310   KETONESUR NEGATIVE 04/02/2014 0310   PROTEINUR 100 (A) 04/02/2014 0310   UROBILINOGEN 0.2 04/02/2014 0310   NITRITE NEGATIVE 04/02/2014 0310   LEUKOCYTESUR MODERATE (A) 04/02/2014 0310   Sepsis Labs Invalid input(s): PROCALCITONIN,  WBC,  LACTICIDVEN Microbiology Recent Results (from the past 240 hour(s))  SARS Coronavirus 2 (CEPHEID - Performed in [facility] hospital lab), Hosp Order     Status: None   Collection Time: 02/03/19 12:09 PM  Result Value Ref Range Status   SARS Coronavirus 2 NEGATIVE NEGATIVE Final    Comment: (NOTE) If result is NEGATIVE SARS-CoV-2 target nucleic acids are NOT DETECTED. The SARS-CoV-2 RNA is generally detectable in upper and lower  respiratory specimens during the acute phase of infection. The lowest  concentration of SARS-CoV-2 viral copies this assay can detect is 250  copies / mL. A negative result does not preclude SARS-CoV-2 infection  and should not be used as the sole basis for treatment or other  patient management decisions.  A negative result may occur with  improper specimen collection / handling, submission of specimen other  than nasopharyngeal swab, presence of viral mutation(s) within the  areas targeted by this assay, and inadequate number of viral copies  (<250 copies / mL). A negative result must be combined with clinical  observations, patient history, and  epidemiological information. If result is POSITIVE SARS-CoV-2 target nucleic acids are DETECTED. The SARS-CoV-2 RNA is generally detectable in upper and lower  respiratory specimens dur ing the acute phase of infection.  Positive  results are indicative of active infection with SARS-CoV-2.  Clinical  correlation with patient history and other diagnostic information is  necessary to determine patient infection status.  Positive results do  not rule out bacterial infection or co-infection with other viruses. If result is PRESUMPTIVE POSTIVE SARS-CoV-2 nucleic acids MAY BE PRESENT.   A presumptive positive result was obtained on the submitted specimen  and confirmed on repeat testing.  While 2019 novel coronavirus  (SARS-CoV-2) nucleic acids may be present in the submitted sample  additional confirmatory testing may be necessary for epidemiological  and / or clinical management purposes  to differentiate between  SARS-CoV-2 and other Sarbecovirus currently known to infect humans.  If clinically indicated additional testing with an alternate test  methodology [phone]) is advised. The SARS-CoV-2 RNA is generally  detectable in upper and lower respiratory sp ecimens during the acute  phase of infection. The expected result is Negative. Fact Sheet for Patients:  StrictlyIdeas.no Fact Sheet for Healthcare Providers: BankingDealers.co.za This test is not yet approved or cleared by the [place] FDA and has been authorized for detection and/or diagnosis of SARS-CoV-2 by FDA under an Emergency Use Authorization (EUA).  This EUA will remain in effect (meaning this test can be used) for the duration of the COVID-19 declaration under Section 564(b)(1) of the Act, 21 U.S.C. section 360bbb-3(b)(1), unless the authorization is terminated or revoked sooner. Performed at [facility], [address] [place], [place] [address]   MRSA PCR Screening      Status: None   Collection Time: 02/03/19  4:43 PM  Result Value Ref Range Status   MRSA by PCR NEGATIVE NEGATIVE Final    Comment:        The GeneXpert MRSA Assay (FDA approved for NASAL specimens only), is one component of a comprehensive MRSA colonization surveillance program. It is not intended to diagnose MRSA infection nor to guide or monitor treatment for MRSA infections. Performed at [facility], [address] [place], [place] [address]      Time coordinating discharge: 35 minutes  SIGNED:   Rodena Goldmann, DO [facility] 02/05/2019, 8:49 AM  If 7PM-7AM, please contact night-coverage [email or web address] Password [email or web address]

## 2019-02-05 NOTE — Progress Notes (Addendum)
Progress Note  Patient Name: Melissa [name] Date of Encounter: 02/05/2019  Primary Cardiologist: Kate Sable, MD  Subjective   No chest pain or palpitations, no shortness of breath or abdominal pain, no lightheadedness.  Inpatient Medications    Scheduled Meds: . flecainide  50 mg Oral Q12H  . multivitamin with minerals  1 tablet Oral Daily  . rivaroxaban  20 mg Oral Q supper   Continuous Infusions: . sodium chloride 10 mL/hr at 02/04/19 1700   PRN Meds: sodium chloride, acetaminophen, ALPRAZolam, atropine, ondansetron (ZOFRAN) IV   Vital Signs    Vitals:   02/05/19 0500 02/05/19 0600 02/05/19 0700 02/05/19 0800  BP: (!) 151/67 (!) 118/58 130/72 126/65  Pulse: (!) 51 (!) 51 (!) 53 60  Resp: 10 11 13 14   Temp:      TempSrc:      SpO2: 98% 99% 96% 99%  Weight:      Height:        Intake/Output Summary (Last 24 hours) at 02/05/2019 0824 Last data filed at 02/05/2019 0600 Gross per 24 hour  Intake 461.33 ml  Output 1700 ml  Net -1238.67 ml   Filed Weights   02/03/19 1454 02/04/19 0500 02/05/19 0400  Weight: 63.6 kg 65.3 kg 64.9 kg    Telemetry    Sinus rhythm.  Lead artifact noted.  Personally reviewed.  ECG    Tracing from 02/05/2019 shows sinus bradycardia with low voltage, normal QRS and QTc.  Personally reviewed.  Physical Exam   GEN: No acute distress.   Neck: No JVD. Cardiac: RRR, soft systolic murmur, no gallop.  Respiratory: Nonlabored. Clear to auscultation bilaterally. GI: Soft, nontender, bowel sounds present. MS: No edema; No deformity. Neuro:  Nonfocal. Psych: Alert and oriented x 3. Normal affect.  Labs    Chemistry Recent Labs  Lab 02/03/19 1042 02/04/19 0432 02/05/19 0444  NA 131* 129* 129*  K 3.8 4.0 3.5  CL 94* 90* 91*  CO2 25 28 27   GLUCOSE 120* 92 79  BUN 16 15 28*  CREATININE 0.75 0.75 0.97  CALCIUM 10.1 9.5 9.3  PROT  --  7.3 6.4*  ALBUMIN  --  4.3 4.0  AST  --  30 24  ALT  --  26 23  ALKPHOS  --  89 84   BILITOT  --  1.7* 1.4*  GFRNONAA >60 >60 >60  GFRAA >60 >60 >60  ANIONGAP 12 11 11      Hematology Recent Labs  Lab 02/03/19 1042 02/04/19 0432 02/05/19 0444  WBC 6.3 6.5 6.9  RBC 4.19 4.17 3.64*  HGB 14.1 13.6 12.0  HCT 38.9 39.5 35.2*  MCV 92.8 94.7 96.7  MCH 33.7 32.6 33.0  MCHC 36.2* 34.4 34.1  RDW 11.9 11.9 11.9  PLT 167 168 141*    Cardiac Enzymes Recent Labs  Lab 02/01/19 1019 02/03/19 1042 02/03/19 1535 02/03/19 2044  TROPONINI <0.03 0.05* 0.05* 0.04*   No results for input(s): TROPIPOC in the last 168 hours.   BNP Recent Labs  Lab 02/04/19 0432 02/05/19 0444  BNP 128.0* 42.0     Radiology    No results found.  Cardiac Studies   Echocardiogram 02/04/2019:  1. The left ventricle has normal systolic function with an ejection fraction of 60-65%. The cavity size was normal. Left ventricular diastolic parameters were normal. No evidence of left ventricular regional wall motion abnormalities.  2. The right ventricle has normal systolic function. The cavity was normal. There is no increase  in right ventricular wall thickness. Right ventricular systolic pressure normal with an estimated pressure of 28.8 mmHg.  3. Mildly thickened tricuspid valve leaflets. There is mild tricuspid regurgitation.  4. The aortic valve is tricuspid. Mild aortic annular calcification noted.  5. The mitral valve is grossly normal. Mild thickening of the mitral valve leaflet. There is mild mitral regurgitation.  6. The tricuspid valve is grossly normal.  7. The aortic root is normal in size and structure.  Patient Profile     68 y.o. female with a history of hypertension, previous stroke, and paroxysmal atrial fibrillation now presenting with rapid, symptomatic atrial fibrillation.  Assessment & Plan    1.  Paroxysmal atrial fibrillation, presenting with symptomatic rapid atrial fibrillation and now status post chemical conversion to sinus rhythm following flecainide 300 mg dose  yesterday per Dr. Johnsie Cancel.  She is on Xarelto for stroke prophylaxis with CHADSVASC score of 5, had been on Lopressor 25 mg twice daily although bradycardic in sinus rhythm.  2.  Essential hypertension systolics 725-366 range.  She was on Norvasc 5 mg daily, HCTZ 25 mg daily, and Benicar 40 mg daily as an outpatient.  All of these medications are held currently per primary team.  3.  Previous history of hyperthyroidism, recent TSH normal at 2.058.  4.  No known history of CAD, negative GXT in 2018.  Follow-up echocardiogram this admission shows LVEF 60 to 65%.  5.  History of stroke in 2015.  [facility] will sign off.  Anticipate discharge home today. Medication Recommendations: Flecainide 50 mg twice daily.  As outpatient would resume beta-blocker in the form of Toprol-XL 12.5 mg once daily.  Would not start back on HCTZ at this time.  Resume Benicar but hold off on Norvasc for now.  Continue Xarelto.  Patient should check heart rate and blood pressure at home daily for the next few weeks. Other recommendations (labs, testing, etc): No additional cardiac testing at this time, although she will eventually need a GXT on flecainide as an outpatient. Follow up as an outpatient: We will schedule a telehealth visit with our practice in 1 week.  Signed, Rozann Lesches, MD  02/05/2019, 8:24 AM

## 2019-02-07 ENCOUNTER — Observation Stay (HOSPITAL_COMMUNITY)
Admission: EM | Admit: 2019-02-07 | Discharge: 2019-02-08 | Disposition: A | Discharge status: Home / Self Care | Payer: PPO | Attending: Internal Medicine | Admitting: Internal Medicine

## 2019-02-07 ENCOUNTER — Encounter (HOSPITAL_COMMUNITY): Payer: Self-pay | Admitting: Emergency Medicine

## 2019-02-07 ENCOUNTER — Emergency Department (HOSPITAL_COMMUNITY): Payer: PPO

## 2019-02-07 ENCOUNTER — Other Ambulatory Visit: Payer: Self-pay

## 2019-02-07 DIAGNOSIS — Z1159 Encounter for screening for other viral diseases: Secondary | ICD-10-CM | POA: Diagnosis not present

## 2019-02-07 DIAGNOSIS — Z8542 Personal history of malignant neoplasm of other parts of uterus: Secondary | ICD-10-CM | POA: Diagnosis not present

## 2019-02-07 DIAGNOSIS — I480 Paroxysmal atrial fibrillation: Secondary | ICD-10-CM | POA: Diagnosis not present

## 2019-02-07 DIAGNOSIS — I10 Essential (primary) hypertension: Secondary | ICD-10-CM | POA: Diagnosis present

## 2019-02-07 DIAGNOSIS — E871 Hypo-osmolality and hyponatremia: Secondary | ICD-10-CM | POA: Diagnosis not present

## 2019-02-07 DIAGNOSIS — I4891 Unspecified atrial fibrillation: Secondary | ICD-10-CM | POA: Diagnosis not present

## 2019-02-07 DIAGNOSIS — Z8673 Personal history of transient ischemic attack (TIA), and cerebral infarction without residual deficits: Secondary | ICD-10-CM | POA: Insufficient documentation

## 2019-02-07 DIAGNOSIS — Z96653 Presence of artificial knee joint, bilateral: Secondary | ICD-10-CM | POA: Insufficient documentation

## 2019-02-07 DIAGNOSIS — E0590 Thyrotoxicosis, unspecified without thyrotoxic crisis or storm: Secondary | ICD-10-CM | POA: Diagnosis not present

## 2019-02-07 DIAGNOSIS — Z7901 Long term (current) use of anticoagulants: Secondary | ICD-10-CM | POA: Diagnosis not present

## 2019-02-07 DIAGNOSIS — F419 Anxiety disorder, unspecified: Secondary | ICD-10-CM | POA: Diagnosis not present

## 2019-02-07 DIAGNOSIS — R002 Palpitations: Secondary | ICD-10-CM | POA: Diagnosis not present

## 2019-02-07 DIAGNOSIS — Z03818 Encounter for observation for suspected exposure to other biological agents ruled out: Secondary | ICD-10-CM | POA: Diagnosis not present

## 2019-02-07 DIAGNOSIS — Z79899 Other long term (current) drug therapy: Secondary | ICD-10-CM | POA: Insufficient documentation

## 2019-02-07 LAB — CBC
HCT: 39.5 % (ref 36.0–46.0)
Hemoglobin: 13.7 g/dL (ref 12.0–15.0)
MCH: 32.9 pg (ref 26.0–34.0)
MCHC: 34.7 g/dL (ref 30.0–36.0)
MCV: 95 fL (ref 80.0–100.0)
Platelets: 166 10*3/uL (ref 150–400)
RBC: 4.16 MIL/uL (ref 3.87–5.11)
RDW: 11.8 % (ref 11.5–15.5)
WBC: 6.3 10*3/uL (ref 4.0–10.5)
nRBC: 0 % (ref 0.0–0.2)

## 2019-02-07 LAB — TSH: TSH: 2.635 u[IU]/mL (ref 0.350–4.500)

## 2019-02-07 LAB — BASIC METABOLIC PANEL
Anion gap: 14 (ref 5–15)
BUN: 23 mg/dL (ref 8–23)
CO2: 27 mmol/L (ref 22–32)
Calcium: 10.1 mg/dL (ref 8.9–10.3)
Chloride: 88 mmol/L — ABNORMAL LOW (ref 98–111)
Creatinine, Ser: 0.72 mg/dL (ref 0.44–1.00)
GFR calc Af Amer: >60 mL/min (ref >60)
GFR calc non Af Amer: >60 mL/min (ref >60)
Glucose, Bld: 141 mg/dL — ABNORMAL HIGH (ref 70–99)
Potassium: 3.6 mmol/L (ref 3.5–5.1)
Sodium: 129 mmol/L — ABNORMAL LOW (ref 135–145)

## 2019-02-07 LAB — MAGNESIUM: Magnesium: 1.9 mg/dL (ref 1.7–2.4)

## 2019-02-07 MED ORDER — DILTIAZEM HCL 100 MG IV SOLR
5.0000 mg/h | INTRAVENOUS | Status: DC
  Administered 2019-02-07: 5 mg/h via INTRAVENOUS
  Filled 2019-02-07: qty 100

## 2019-02-07 MED ORDER — DILTIAZEM HCL 25 MG/5ML IV SOLN
10.0000 mg | Freq: Once | INTRAVENOUS | Status: AC
  Administered 2019-02-07: 10 mg via INTRAVENOUS
  Filled 2019-02-07: qty 5

## 2019-02-07 NOTE — ED Triage Notes (Signed)
Patient complaining of "heart racing" starting approximately 1 hour ago. States "this is my third visit this week." Denies chest pain or shortness of breath.

## 2019-02-08 ENCOUNTER — Encounter (HOSPITAL_COMMUNITY): Payer: Self-pay | Admitting: Internal Medicine

## 2019-02-08 ENCOUNTER — Emergency Department (HOSPITAL_COMMUNITY): Payer: PPO

## 2019-02-08 ENCOUNTER — Other Ambulatory Visit: Payer: Self-pay | Admitting: Cardiology

## 2019-02-08 DIAGNOSIS — Z1159 Encounter for screening for other viral diseases: Secondary | ICD-10-CM | POA: Diagnosis not present

## 2019-02-08 DIAGNOSIS — I4891 Unspecified atrial fibrillation: Secondary | ICD-10-CM | POA: Diagnosis present

## 2019-02-08 DIAGNOSIS — E871 Hypo-osmolality and hyponatremia: Secondary | ICD-10-CM | POA: Diagnosis present

## 2019-02-08 DIAGNOSIS — I10 Essential (primary) hypertension: Secondary | ICD-10-CM

## 2019-02-08 DIAGNOSIS — F419 Anxiety disorder, unspecified: Secondary | ICD-10-CM | POA: Diagnosis not present

## 2019-02-08 DIAGNOSIS — R002 Palpitations: Principal | ICD-10-CM

## 2019-02-08 DIAGNOSIS — Z79899 Other long term (current) drug therapy: Secondary | ICD-10-CM | POA: Diagnosis not present

## 2019-02-08 DIAGNOSIS — E0590 Thyrotoxicosis, unspecified without thyrotoxic crisis or storm: Secondary | ICD-10-CM | POA: Diagnosis not present

## 2019-02-08 DIAGNOSIS — I480 Paroxysmal atrial fibrillation: Secondary | ICD-10-CM | POA: Diagnosis not present

## 2019-02-08 DIAGNOSIS — Z8542 Personal history of malignant neoplasm of other parts of uterus: Secondary | ICD-10-CM | POA: Diagnosis not present

## 2019-02-08 DIAGNOSIS — Z7901 Long term (current) use of anticoagulants: Secondary | ICD-10-CM | POA: Diagnosis not present

## 2019-02-08 DIAGNOSIS — Z8673 Personal history of transient ischemic attack (TIA), and cerebral infarction without residual deficits: Secondary | ICD-10-CM | POA: Diagnosis not present

## 2019-02-08 DIAGNOSIS — Z96653 Presence of artificial knee joint, bilateral: Secondary | ICD-10-CM | POA: Diagnosis not present

## 2019-02-08 LAB — SARS CORONAVIRUS 2 BY RT PCR (HOSPITAL ORDER, PERFORMED IN ~~LOC~~ HOSPITAL LAB): SARS Coronavirus 2: NEGATIVE

## 2019-02-08 LAB — TROPONIN I: Troponin I: 0.04 ng/mL (ref <0.03)

## 2019-02-08 MED ORDER — METOPROLOL SUCCINATE ER 25 MG PO TB24
12.5000 mg | ORAL_TABLET | Freq: Every day | ORAL | Status: DC
  Administered 2019-02-08: 08:00:00 12.5 mg via ORAL
  Filled 2019-02-08: qty 1

## 2019-02-08 MED ORDER — ADULT MULTIVITAMIN W/MINERALS CH
1.0000 | ORAL_TABLET | Freq: Every day | ORAL | Status: DC
  Administered 2019-02-08: 1 via ORAL
  Filled 2019-02-08: qty 1

## 2019-02-08 MED ORDER — ALPRAZOLAM 0.25 MG PO TABS
0.2500 mg | ORAL_TABLET | Freq: Three times a day (TID) | ORAL | Status: DC | PRN
  Administered 2019-02-08 (×2): 0.25 mg via ORAL
  Filled 2019-02-08 (×2): qty 1

## 2019-02-08 MED ORDER — IRBESARTAN 150 MG PO TABS
150.0000 mg | ORAL_TABLET | Freq: Once | ORAL | Status: AC
  Administered 2019-02-08: 150 mg via ORAL
  Filled 2019-02-08: qty 1

## 2019-02-08 MED ORDER — FLECAINIDE ACETATE 50 MG PO TABS: 100.0000 mg | ORAL_TABLET | Freq: Two times a day (BID) | ORAL | 0 refills | Status: DC

## 2019-02-08 MED ORDER — ACETAMINOPHEN 650 MG RE SUPP: 650.0000 mg | Freq: Four times a day (QID) | RECTAL | Status: DC | PRN

## 2019-02-08 MED ORDER — SODIUM CHLORIDE 0.9% FLUSH: 3.0000 mL | INTRAVENOUS | Status: DC | PRN

## 2019-02-08 MED ORDER — SODIUM CHLORIDE 0.9 % IV SOLN
INTRAVENOUS | Status: DC
  Administered 2019-02-08: 04:00:00 via INTRAVENOUS

## 2019-02-08 MED ORDER — ACETAMINOPHEN 325 MG PO TABS: 650.0000 mg | ORAL_TABLET | Freq: Four times a day (QID) | ORAL | Status: DC | PRN

## 2019-02-08 MED ORDER — METOPROLOL TARTRATE 5 MG/5ML IV SOLN
5.0000 mg | Freq: Once | INTRAVENOUS | Status: AC
  Administered 2019-02-08: 5 mg via INTRAVENOUS
  Filled 2019-02-08: qty 5

## 2019-02-08 MED ORDER — HYDRALAZINE HCL 20 MG/ML IJ SOLN: 10.0000 mg | Freq: Four times a day (QID) | INTRAMUSCULAR | Status: DC | PRN

## 2019-02-08 MED ORDER — IRBESARTAN 150 MG PO TABS: 300.0000 mg | ORAL_TABLET | Freq: Every day | ORAL | Status: DC

## 2019-02-08 MED ORDER — METOPROLOL SUCCINATE ER 25 MG PO TB24: 25.0000 mg | ORAL_TABLET | Freq: Every day | ORAL | 0 refills | Status: DC

## 2019-02-08 MED ORDER — SODIUM CHLORIDE 0.9 % IV SOLN: 250.0000 mL | INTRAVENOUS | Status: DC | PRN

## 2019-02-08 MED ORDER — IRBESARTAN 150 MG PO TABS
150.0000 mg | ORAL_TABLET | Freq: Every day | ORAL | Status: DC
  Administered 2019-02-08: 08:00:00 150 mg via ORAL
  Filled 2019-02-08 (×3): qty 1

## 2019-02-08 MED ORDER — METOPROLOL SUCCINATE ER 25 MG PO TB24: 25.0000 mg | ORAL_TABLET | Freq: Every day | ORAL | Status: DC

## 2019-02-08 MED ORDER — SODIUM CHLORIDE 0.9% FLUSH: 3.0000 mL | Freq: Two times a day (BID) | INTRAVENOUS | Status: DC

## 2019-02-08 MED ORDER — RIVAROXABAN 20 MG PO TABS: 20.0000 mg | ORAL_TABLET | Freq: Every day | ORAL | Status: DC

## 2019-02-08 MED ORDER — FLECAINIDE ACETATE 100 MG PO TABS
100.0000 mg | ORAL_TABLET | Freq: Two times a day (BID) | ORAL | Status: DC
  Administered 2019-02-08: 100 mg via ORAL
  Filled 2019-02-08 (×2): qty 1

## 2019-02-08 NOTE — Consult Note (Addendum)
Cardiology Consultation:   Patient ID: Melissa [name] MRN: [id number]; DOB: [birth year]  Admit date: 02/07/2019 Date of Consult: 02/08/2019  Primary Care Provider: Asencion Noble, MD Primary Cardiologist: Kate Sable, MD   Patient Profile:   Melissa [name] is a 68 y.o. female with a hx of PAF who is being seen today for the evaluation of palpitations at the request of Dr Jani Gravel.  History of Present Illness:   Melissa [name] 68 year old female with h/o  PAF first seen for this by Bernerd Pho PA 09/17/17. TTE has shown normal EF without LVH and mild bi-atrial enlargement 09/05/17 No CAD with normal ETT 09/30/17. She has had a CVA in 2015 but has been cleared to be on anticoagulation by neuro. Seen in [facility] ER 5/3 with sudden onset rapid afib and DCC with single 150 J biphasic shock Started on xarelto and d/c with metoprolol 25 bid and xarelto 20 mg daily. Presented back on 5/6 with with a-fib with RVR, refused DCCV again, but cardioverted after 1 dose of Flecainide 300 mg po. Discharged on 5/7 on Flecainide 50mg  po bid and Toprol XL 12.5mg  po qday, pt discharged 02/05/19  She came back to ER the last night with palpitations that started last night and was again in a-fib with RVR, cardioverted on cardizem drip. She is frustrated being here 3x in one week.  Pt states she has been compliant with her medications since discharge.   Past Medical History:  Diagnosis Date  . Atrial fibrillation ([place])   . 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 Hypertension   . Hyperthyroidism    remote  . Uterine cancer [facility])     Past Surgical History:  Procedure Laterality Date  . ABDOMINAL HYSTERECTOMY    . CHOLECYSTECTOMY    . COLONOSCOPY N/A 05/12/2014   Procedure: COLONOSCOPY;  Surgeon: Daneil Dolin, MD;  Location: [facility];  Service: Endoscopy;  Laterality: N/A;  1130  . COLONOSCOPY N/A 07/26/2017   Procedure: COLONOSCOPY;  Surgeon: Daneil Dolin, MD;  Location: [facility];   Service: Endoscopy;  Laterality: N/A;  1:00 pm  . REPLACEMENT TOTAL KNEE BILATERAL     bilateral knees replaced twice     Home Medications:  Prior to Admission medications   Medication Sig Start Date End Date Taking? Authorizing Provider  acetaminophen (TYLENOL) 500 MG tablet Take 500 mg by mouth as needed for mild pain.    Yes [provider]  ALPRAZolam (XANAX) 0.25 MG tablet Take 1 tablet (0.25 mg total) by mouth 3 (three) times daily as needed for anxiety or sleep. 02/05/19  Yes Shah, Pratik D, DO  flecainide (TAMBOCOR) 50 MG tablet Take 1 tablet (50 mg total) by mouth every 12 (twelve) hours for 30 days. 02/05/19 03/07/19 Yes Shah, Pratik D, DO  metoprolol succinate (TOPROL XL) 25 MG 24 hr tablet Take 0.5 tablets (12.5 mg total) by mouth daily. 02/05/19 02/05/20 Yes Shah, Pratik D, DO  Multiple Vitamins-Minerals (MULTIVITAMIN WITH MINERALS) tablet Take 1 tablet by mouth daily.   Yes [provider]  olmesartan (BENICAR) 40 MG tablet Take 40 mg by mouth daily.  12/01/18  Yes [provider]  rivaroxaban (XARELTO) 20 MG TABS tablet Take 1 tablet (20 mg total) by mouth daily with supper. 02/01/19  Yes Triplett, Tammy, PA-C    Inpatient Medications: Scheduled Meds: . irbesartan  150 mg Oral Daily  . metoprolol succinate  12.5 mg Oral Daily  . multivitamin with  minerals  1 tablet Oral Daily  . rivaroxaban  20 mg Oral Q supper  . sodium chloride flush  3 mL Intravenous Q12H   Continuous Infusions: . sodium chloride    . diltiazem (CARDIZEM) infusion Stopped (02/08/19 0513)   PRN Meds: sodium chloride, acetaminophen **OR** acetaminophen, ALPRAZolam, sodium chloride flush  Allergies:   No Known Allergies  Social History:   Social History   Socioeconomic History  . Marital status: Married    Spouse name: Not on file  . Number of children: 1  . Years of education: college  . Highest education level: Not on file  Occupational History  . Occupation: s    Employer:  [employer]  . Financial resource strain: Not on file  . Food insecurity:    Worry: Not on file    Inability: Not on file  . Transportation needs:    Medical: Not on file    Non-medical: Not on file  Tobacco Use  . Smoking status: Never Smoker  . Smokeless tobacco: Never Used  . Tobacco comment: Never smoked  Substance and Sexual Activity  . Alcohol use: No    Alcohol/week: 0.0 standard drinks  . Drug use: No  . Sexual activity: Not on file  Lifestyle  . Physical activity:    Days per week: Not on file    Minutes per session: Not on file  . Stress: Not on file  Relationships  . Social connections:    Talks on phone: Not on file    Gets together: Not on file    Attends religious service: Not on file    Active member of club or organization: Not on file    Attends meetings of clubs or organizations: Not on file    Relationship status: Not on file  . Intimate partner violence:    Fear of current or ex partner: Not on file    Emotionally abused: Not on file    Physically abused: Not on file    Forced sexual activity: Not on file  Other Topics Concern  . Not on file  Social History Narrative   Patient lives at home with her husband.   Patient drinks soda and coffee diet.    Family History:    Family History  Problem Relation Age of Onset  . CVA Maternal Grandfather 39  . Colon cancer Neg Hx      ROS:  Please see the history of present illness.  All other ROS reviewed and negative.     Physical Exam/Data:   Vitals:   02/08/19 0418 02/08/19 0430 02/08/19 0500 02/08/19 0800  BP: (!) 179/71 (!) 160/82 (!) 162/69 (!) 176/68  Pulse: 60 (!) 59 (!) 53 (!) 50  Resp: 15 20 14 13   Temp: 98.6 F (37 C)   97.7 F (36.5 C)  TempSrc: Oral   Oral  SpO2: 100% 100% 100% 100%  Weight: 64.7 kg     Height: 5' 7.5" (1.715 m)       Intake/Output Summary (Last 24 hours) at 02/08/2019 1214 Last data filed at 02/08/2019 1212 Gross per 24 hour  Intake  961.29 ml  Output 1000 ml  Net -38.71 ml   Last 3 Weights 02/08/2019 02/07/2019 02/05/2019  Weight (lbs) 142 lb 10.2 oz 145 lb 143 lb 1.3 oz  Weight (kg) 64.7 kg 65.772 kg 64.9 kg     Body mass index is 22.01 kg/m.  General:  Well nourished, well developed, in no acute  distress HEENT: normal Lymph: no adenopathy Neck: no JVD Endocrine:  No thryomegaly Vascular: No carotid bruits; FA pulses 2+ bilaterally without bruits  Cardiac:  normal S1, S2; RRR; no murmur  Lungs:  clear to auscultation bilaterally, no wheezing, rhonchi or rales  Abd: soft, nontender, no hepatomegaly  Ext: no edema Musculoskeletal:  No deformities, BUE and BLE strength normal and equal Skin: warm and dry  Neuro:  CNs 2-12 intact, no focal abnormalities noted Psych:  Normal affect   EKG:  The EKG was personally reviewed and demonstrates:  SB. Short PR interval Telemetry:  Telemetry was personally reviewed and demonstrates:  SB to SR  Relevant CV Studies: TTE 02/04/2019 1. The left ventricle has normal systolic function with an ejection fraction of 60-65%. The cavity size was normal. Left ventricular diastolic parameters were normal. No evidence of left ventricular regional wall motion abnormalities.  2. The right ventricle has normal systolic function. The cavity was normal. There is no increase in right ventricular wall thickness. Right ventricular systolic pressure normal with an estimated pressure of 28.8 mmHg.  3. Mildly thickened tricuspid valve leaflets. There is mild tricuspid regurgitation.  4. The aortic valve is tricuspid. Mild aortic annular calcification noted.  5. The mitral valve is grossly normal. Mild thickening of the mitral valve leaflet. There is mild mitral regurgitation.  6. The tricuspid valve is grossly normal.  7. The aortic root is normal in size and structure.  Laboratory Data:  Chemistry Recent Labs  Lab 02/04/19 0432 02/05/19 0444 02/07/19 2245  NA 129* 129* 129*  K 4.0 3.5 3.6   CL 90* 91* 88*  CO2 28 27 27   GLUCOSE 92 79 141*  BUN 15 28* 23  CREATININE 0.75 0.97 0.72  CALCIUM 9.5 9.3 10.1  GFRNONAA >60 >60 >60  GFRAA >60 >60 >60  ANIONGAP 11 11 14     Recent Labs  Lab 02/04/19 0432 02/05/19 0444  PROT 7.3 6.4*  ALBUMIN 4.3 4.0  AST 30 24  ALT 26 23  ALKPHOS 89 84  BILITOT 1.7* 1.4*   Hematology Recent Labs  Lab 02/04/19 0432 02/05/19 0444 02/07/19 2245  WBC 6.5 6.9 6.3  RBC 4.17 3.64* 4.16  HGB 13.6 12.0 13.7  HCT 39.5 35.2* 39.5  MCV 94.7 96.7 95.0  MCH 32.6 33.0 32.9  MCHC 34.4 34.1 34.7  RDW 11.9 11.9 11.8  PLT 168 141* 166   Cardiac Enzymes Recent Labs  Lab 02/03/19 1042 02/03/19 1535 02/03/19 2044 02/08/19 0219  TROPONINI 0.05* 0.05* 0.04* 0.04*   No results for input(s): TROPIPOC in the last 168 hours.  BNP Recent Labs  Lab 02/04/19 0432 02/05/19 0444  BNP 128.0* 42.0    DDimer No results for input(s): DDIMER in the last 168 hours.  Radiology/Studies:  Dg Chest Port 1 View  Result Date: 02/08/2019 CLINICAL DATA:  68 year old female with AFib. EXAM: PORTABLE CHEST 1 VIEW COMPARISON:  Chest radiograph dated 02/01/2019 FINDINGS: There is no focal consolidation, pleural effusion, or pneumothorax. The cardiac silhouette is within normal limits. No acute osseous pathology. IMPRESSION: No active disease. Electronically Signed   By: Anner Crete M.D.   On: 02/08/2019 01:14    Assessment and Plan:   1. PAF:   1. Continue XARELTO 2. D/C cardizem drip, not a good candidate for PO cardizem as she tends to be bradycardic while in SR 3. Increase metoprolol to 25 mg po BID 4. Increase flecainide to 100 mg PO BID 5. She can be discharged, we  will arrange for an outpatient visit with EP - Dr Curt Bears for consideratio of an a-fib ablation. She would be a great candidate for RF ablation given significant weight loss (160 kg), easy cardioversion back to SR, normal LVEF, ony mild LA dilatation.  2. CVA:  Distant 2015 no residual  deficits cleared by neuro for anticoagulation 3. HTN:  increase metoprolol to 25 mg po daily, irbesartan was increased to 300 mg po daily was added.  [facility] will sign off.   Medication Recommendations:  As above, the patient can be discharged today Other recommendations (labs, testing, etc):  We will arrange for an outpatient treadmill test later this week.  Follow up as an outpatient: we will arrange for a tele visit with Dr Curt Bears this week.  For questions or updates, please contact [place] Please consult [email or web address] for contact info under   Signed, Ena Dawley, MD  02/08/2019 12:14 PM

## 2019-02-08 NOTE — Progress Notes (Signed)
[place] for xarelto  Indication: atrial fibrillation  No Known Allergies  Patient Measurements: Height: 5' 7.5" (171.5 cm) Weight: 142 lb 10.2 oz (64.7 kg) IBW/kg (Calculated) : 62.75   Vital Signs: Temp: 97.7 F (36.5 C) (05/10 0800) Temp Source: Oral (05/10 0800) BP: 176/68 (05/10 0800) Pulse Rate: 50 (05/10 0800)  Labs: Recent Labs    02/07/19 2245 02/08/19 0219  HGB 13.7  --   HCT 39.5  --   PLT 166  --   CREATININE 0.72  --   TROPONINI  --  0.04*    Estimated Creatinine Clearance: 66.7 mL/min (by C-G formula based on SCr of 0.72 mg/dL).   Medical History: Past Medical History:  Diagnosis Date  . Atrial fibrillation ([place])   . 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 Hypertension   . Hyperthyroidism    remote  . Uterine cancer ([facility])     Medications:  Medications Prior to Admission  Medication Sig Dispense Refill Last Dose  . ALPRAZolam (XANAX) 0.25 MG tablet Take 1 tablet (0.25 mg total) by mouth 3 (three) times daily as needed for anxiety or sleep. 10 tablet 0 02/07/2019 at Unknown time  . flecainide (TAMBOCOR) 50 MG tablet Take 1 tablet (50 mg total) by mouth every 12 (twelve) hours for 30 days. 60 tablet 2 02/07/2019 at Unknown time  . metoprolol succinate (TOPROL XL) 25 MG 24 hr tablet Take 0.5 tablets (12.5 mg total) by mouth daily. 15 tablet 11 02/07/2019 at Unknown time  . Multiple Vitamins-Minerals (MULTIVITAMIN WITH MINERALS) tablet Take 1 tablet by mouth daily.   02/07/2019 at Unknown time  . olmesartan (BENICAR) 40 MG tablet Take 40 mg by mouth daily.    02/07/2019 at Unknown time  . rivaroxaban (XARELTO) 20 MG TABS tablet Take 1 tablet (20 mg total) by mouth daily with supper. 30 tablet 0 02/07/2019 at Unknown time  . acetaminophen (TYLENOL) 500 MG tablet Take 500 mg by mouth as needed.    Unknown at Unknown time   Scheduled:  . irbesartan  150 mg Oral Daily  . metoprolol succinate  12.5 mg Oral Daily  .  multivitamin with minerals  1 tablet Oral Daily  . sodium chloride flush  3 mL Intravenous Q12H    Assessment: 68 yo female here with afib/RVR. She was recently admitted with the same and s/p DCCV on 5/3.  Pharmacy consulted to dose Xarelto (she was on this at home with last dose on 5/9. -Hg= 13.7, SCr= 0.72   Goal of Therapy:  Monitor platelets by anticoagulation protocol: Yes   Plan:  -Xarelto 20mg  po daily with supper -Will follow patient progress  Hildred Laser, PharmD Clinical Pharmacist **Pharmacist phone directory can now be found on [email or web address] ([email or web address]).  Listed under [place].

## 2019-02-08 NOTE — H&P (Signed)
TRH H&P    Patient Demographics:    Melissa [name], is a 68 y.o. female  MRN: [id number]  DOB - 1950/11/18  Admit Date - 02/07/2019  Referring MD/NP/PA:   Milton Ferguson  Outpatient Primary MD for the patient is Asencion Noble, MD  Patient coming from:  home  Chief complaint- palpitations   HPI:    Melissa [name]  is a 68 y.o. female, w hx of Pafib, hx of small left thalamic hemorrhage in 2015 secondary to uncontrolled hypertension, apparently was recently tx in ED  02/01/19 for Afib with RVR with cardioversion and discharge on metoprolol 25mg  po bid and xarelto 02/03/19 admitted for Afib with RVR, started on cardizem GTT, evaluated by cardiology=> Flecainide 300mg => nsr, followed by bradycardia and started on Flecainide 50mg  po bid and Toprol XL 12.5mg  po qday, pt discharged 02/05/19  Pt returns with ER with c/o palpitations starting last nite.  Pt states she has been compliant with her medications since discharge.  Pt denies fever, chills, cough, cp, sob, n/v, abd pain, diarrhea, brbpr, black stool.    In ED T 98.1,  P 144 Bp 133/117  Pox 100% on RA  EKG Afib at 150,   CXR IMPRESSION: No active disease.  TSH 2.635 Na 129, K 3.6 Bun 23, Creatinine 0.72  Glucose 141 Magnesium 1.9 Wbc 6.9, Hgb 12.0, Plt 141  Pt started on cardizem GTT as well as given metoprolol 5mg  iv x1   Pt will be admitted for Afib with RVR, and mild hyponatremia    Review of systems:    In addition to the HPI above,  No Fever-chills, No Headache, No changes with Vision or hearing, No problems swallowing food or Liquids, No Chest pain, No Cough or Shortness of Breath, No Abdominal pain, No Nausea or Vomiting, bowel movements are regular, No Blood in stool or Urine, No dysuria, No new skin rashes or bruises, No new joints pains-aches,  No new weakness, tingling, numbness in any extremity, No recent weight gain or loss, No  polyuria, polydypsia or polyphagia, No significant Mental Stressors.  All other systems reviewed and are negative.    Past History of the following :    Past Medical History:  Diagnosis Date  . Atrial fibrillation ([place])   . 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 Hypertension   . Hyperthyroidism    remote  . Uterine cancer [facility])       Past Surgical History:  Procedure Laterality Date  . ABDOMINAL HYSTERECTOMY    . CHOLECYSTECTOMY    . COLONOSCOPY N/A 05/12/2014   Procedure: COLONOSCOPY;  Surgeon: Daneil Dolin, MD;  Location[facility];  Service: Endoscopy;  Laterality: N/A;  1130  . COLONOSCOPY N/A 07/26/2017   Procedure: COLONOSCOPY;  Surgeon: Daneil Dolin, MD;  Location: [facility];  Service: Endoscopy;  Laterality: N/A;  1:00 pm  . REPLACEMENT TOTAL KNEE BILATERAL     bilateral knees replaced twice      Social History:      Social History  Tobacco Use  . Smoking status: Never Smoker  . Smokeless tobacco: Never Used  . Tobacco comment: Never smoked  Substance Use Topics  . Alcohol use: No    Alcohol/week: 0.0 standard drinks       Family History :     Family History  Problem Relation Age of Onset  . CVA Maternal Grandfather 26  . Colon cancer Neg Hx       Home Medications:   Prior to Admission medications   Medication Sig Start Date End Date Taking? Authorizing Provider  ALPRAZolam (XANAX) 0.25 MG tablet Take 1 tablet (0.25 mg total) by mouth 3 (three) times daily as needed for anxiety or sleep. 02/05/19  Yes Shah, Pratik D, DO  flecainide (TAMBOCOR) 50 MG tablet Take 1 tablet (50 mg total) by mouth every 12 (twelve) hours for 30 days. 02/05/19 03/07/19 Yes Shah, Pratik D, DO  metoprolol succinate (TOPROL XL) 25 MG 24 hr tablet Take 0.5 tablets (12.5 mg total) by mouth daily. 02/05/19 02/05/20 Yes Shah, Pratik D, DO  Multiple Vitamins-Minerals (MULTIVITAMIN WITH MINERALS) tablet Take 1 tablet by mouth daily.   Yes [provider]   olmesartan (BENICAR) 40 MG tablet Take 40 mg by mouth daily.  12/01/18  Yes [provider]  rivaroxaban (XARELTO) 20 MG TABS tablet Take 1 tablet (20 mg total) by mouth daily with supper. 02/01/19  Yes Triplett, Tammy, PA-C  acetaminophen (TYLENOL) 500 MG tablet Take 500 mg by mouth as needed.     [provider]     Allergies:    No Known Allergies   Physical Exam:   Vitals  Blood pressure (!) 173/95, pulse (!) 110, temperature 98.1 F (36.7 C), temperature source Oral, resp. rate 15, height 5\' 7"  (1.702 m), weight 65.8 kg, SpO2 100 %.  1.  General: axox3  2. Psychiatric: euthymic  3. Neurologic: cn2-12 intact, reflexes 2+ symmetric, diffuse with no clonus,  Motor 5/5 in all 4 ext  4. HEENMT:  Anicteric, pupils 1.1mm symmetric, direct, consensual, near intact Mm dry  5. Respiratory : CTAB  6. Cardiovascular : Irr, irr s1, s2, no m/g/r  7. Gastrointestinal:  Abd: soft, nt, nd, +bs  8. Skin:  Ext: no c/c/e, no rash  9.Musculoskeletal:  Good ROM  No adenopathy    Data Review:    CBC Recent Labs  Lab 02/01/19 1019 02/03/19 1042 02/04/19 0432 02/05/19 0444 02/07/19 2245  WBC 5.0 6.3 6.5 6.9 6.3  HGB 14.8 14.1 13.6 12.0 13.7  HCT 41.8 38.9 39.5 35.2* 39.5  PLT 172 167 168 141* 166  MCV 92.9 92.8 94.7 96.7 95.0  MCH 32.9 33.7 32.6 33.0 32.9  MCHC 35.4 36.2* 34.4 34.1 34.7  RDW 11.9 11.9 11.9 11.9 11.8  LYMPHSABS 0.8 0.8 1.3 1.6  --   MONOABS 0.4 0.5 0.7 0.8  --   EOSABS 0.2 0.1 0.1 0.2  --   BASOSABS 0.1 0.0 0.0 0.0  --    ------------------------------------------------------------------------------------------------------------------  Results for orders placed or performed during the hospital encounter of 02/07/19 (from the past 48 hour(s))  Basic metabolic panel     Status: Abnormal   Collection Time: 02/07/19 10:45 PM  Result Value Ref Range   Sodium 129 (L) 135 - 145 mmol/L   Potassium 3.6 3.5 - 5.1 mmol/L   Chloride 88 (L)  98 - 111 mmol/L   CO2 27 22 - 32 mmol/L   Glucose, Bld 141 (H) 70 - 99 mg/dL   BUN 23  8 - 23 mg/dL   Creatinine, Ser 0.72 0.44 - 1.00 mg/dL   Calcium 10.1 8.9 - 10.3 mg/dL   GFR calc non Af Amer >60 >60 mL/min   GFR calc Af Amer >60 >60 mL/min   Anion gap 14 5 - 15    Comment: Performed at [facility], [address] [place], [place] [address]  Magnesium     Status: None   Collection Time: 02/07/19 10:45 PM  Result Value Ref Range   Magnesium 1.9 1.7 - 2.4 mg/dL    Comment: Performed at [facility], [address] [place], [place] [address]  CBC     Status: None   Collection Time: 02/07/19 10:45 PM  Result Value Ref Range   WBC 6.3 4.0 - 10.5 K/uL   RBC 4.16 3.87 - 5.11 MIL/uL   Hemoglobin 13.7 12.0 - 15.0 g/dL   HCT 39.5 36.0 - 46.0 %   MCV 95.0 80.0 - 100.0 fL   MCH 32.9 26.0 - 34.0 pg   MCHC 34.7 30.0 - 36.0 g/dL   RDW 11.8 11.5 - 15.5 %   Platelets 166 150 - 400 K/uL   nRBC 0.0 0.0 - 0.2 %    Comment: Performed at [facility], [address], [place], [place] [address]  TSH     Status: None   Collection Time: 02/07/19 10:45 PM  Result Value Ref Range   TSH 2.635 0.350 - 4.500 uIU/mL    Comment: Performed by a 3rd Generation assay with a functional sensitivity of <=0.01 uIU/mL. Performed at [facility], [address] [place], [place] [address]   SARS Coronavirus 2 (CEPHEID - Performed in [facility] hospital lab), Hosp Order     Status: None   Collection Time: 02/08/19 12:42 AM  Result Value Ref Range   SARS Coronavirus 2 NEGATIVE NEGATIVE    Comment: (NOTE) If result is NEGATIVE SARS-CoV-2 target nucleic acids are NOT DETECTED. The SARS-CoV-2 RNA is generally detectable in upper and lower  respiratory specimens during the acute phase of infection. The lowest  concentration of SARS-CoV-2 viral copies this assay can detect is 250  copies / mL. A negative result does not preclude SARS-CoV-2 infection  and should not be used as the sole basis for treatment or other   patient management decisions.  A negative result may occur with  improper specimen collection / handling, submission of specimen other  than nasopharyngeal swab, presence of viral mutation(s) within the  areas targeted by this assay, and inadequate number of viral copies  (<250 copies / mL). A negative result must be combined with clinical  observations, patient history, and epidemiological information. If result is POSITIVE SARS-CoV-2 target nucleic acids are DETECTED. The SARS-CoV-2 RNA is generally detectable in upper and lower  respiratory specimens dur ing the acute phase of infection.  Positive  results are indicative of active infection with SARS-CoV-2.  Clinical  correlation with patient history and other diagnostic information is  necessary to determine patient infection status.  Positive results do  not rule out bacterial infection or co-infection with other viruses. If result is PRESUMPTIVE POSTIVE SARS-CoV-2 nucleic acids MAY BE PRESENT.   A presumptive positive result was obtained on the submitted specimen  and confirmed on repeat testing.  While 2019 novel coronavirus  (SARS-CoV-2) nucleic acids may be present in the submitted sample  additional confirmatory testing may be necessary for epidemiological  and / or clinical management purposes  to differentiate between  SARS-CoV-2 and other  Sarbecovirus currently known to infect humans.  If clinically indicated additional testing with an alternate test  methodology [phone]) is advised. The SARS-CoV-2 RNA is generally  detectable in upper and lower respiratory sp ecimens during the acute  phase of infection. The expected result is Negative. Fact Sheet for Patients:  StrictlyIdeas.no Fact Sheet for Healthcare Providers: BankingDealers.co.za This test is not yet approved or cleared by the [place] FDA and has been authorized for detection and/or diagnosis of SARS-CoV-2 by  FDA under an Emergency Use Authorization (EUA).  This EUA will remain in effect (meaning this test can be used) for the duration of the COVID-19 declaration under Section 564(b)(1) of the Act, 21 U.S.C. section 360bbb-3(b)(1), unless the authorization is terminated or revoked sooner. Performed at [facility], [address] [place], [place] [address]     Chemistries  Recent Labs  Lab 02/01/19 1019 02/03/19 1042 02/04/19 0432 02/05/19 0444 02/07/19 2245  NA 130* 131* 129* 129* 129*  K 3.8 3.8 4.0 3.5 3.6  CL 92* 94* 90* 91* 88*  CO2 28 25 28 27 27   GLUCOSE 104* 120* 92 79 141*  BUN 14 16 15  28* 23  CREATININE 0.71 0.75 0.75 0.97 0.72  CALCIUM 9.8 10.1 9.5 9.3 10.1  MG  --  1.8 1.8 1.8 1.9  AST  --   --  30 24  --   ALT  --   --  26 23  --   ALKPHOS  --   --  89 84  --   BILITOT  --   --  1.7* 1.4*  --    ------------------------------------------------------------------------------------------------------------------  ------------------------------------------------------------------------------------------------------------------ GFR: Estimated Creatinine Clearance: 65.5 mL/min (by C-G formula based on SCr of 0.72 mg/dL). Liver Function Tests: Recent Labs  Lab 02/04/19 0432 02/05/19 0444  AST 30 24  ALT 26 23  ALKPHOS 89 84  BILITOT 1.7* 1.4*  PROT 7.3 6.4*  ALBUMIN 4.3 4.0   No results for input(s): LIPASE, AMYLASE in the last 168 hours. No results for input(s): AMMONIA in the last 168 hours. Coagulation Profile: No results for input(s): INR, PROTIME in the last 168 hours. Cardiac Enzymes: Recent Labs  Lab 02/01/19 1019 02/03/19 1042 02/03/19 1535 02/03/19 2044  TROPONINI <0.03 0.05* 0.05* 0.04*   BNP (last 3 results) No results for input(s): PROBNP in the last 8760 hours. HbA1C: No results for input(s): HGBA1C in the last 72 hours. CBG: No results for input(s): GLUCAP in the last 168 hours. Lipid Profile: No results for input(s): CHOL, HDL,  LDLCALC, TRIG, CHOLHDL, LDLDIRECT in the last 72 hours. Thyroid Function Tests: Recent Labs    02/07/19 2245  TSH 2.635   Anemia Panel: No results for input(s): VITAMINB12, FOLATE, FERRITIN, TIBC, IRON, RETICCTPCT in the last 72 hours.  --------------------------------------------------------------------------------------------------------------- Urine analysis:    Component Value Date/Time   COLORURINE RED (A) 04/02/2014 0310   APPEARANCEUR HAZY (A) 04/02/2014 0310   LABSPEC 1.010 04/02/2014 0310   PHURINE 6.0 04/02/2014 0310   GLUCOSEU NEGATIVE 04/02/2014 0310   HGBUR LARGE (A) 04/02/2014 0310   BILIRUBINUR NEGATIVE 04/02/2014 0310   KETONESUR NEGATIVE 04/02/2014 0310   PROTEINUR 100 (A) 04/02/2014 0310   UROBILINOGEN 0.2 04/02/2014 0310   NITRITE NEGATIVE 04/02/2014 0310   LEUKOCYTESUR MODERATE (A) 04/02/2014 0310      Imaging Results:    Dg Chest Port 1 View  Result Date: 02/08/2019 CLINICAL DATA:  68 year old female with AFib. EXAM: PORTABLE CHEST 1 VIEW COMPARISON:  Chest radiograph dated 02/01/2019  FINDINGS: There is no focal consolidation, pleural effusion, or pneumothorax. The cardiac silhouette is within normal limits. No acute osseous pathology. IMPRESSION: No active disease. Electronically Signed   By: Anner Crete M.D.   On: 02/08/2019 01:14   Ekg afib at 150, nl axis, no st-t changes c/w ischemia   Assessment & Plan:    Principal Problem:   Atrial fibrillation with RVR (HCC) Active Problems:   Essential hypertension   Hyponatremia   Afib with RVR (recurrent x3 in the past 1 week) Tele Trop I q6h x3 Cardizem GTT Cont Toprol XL 12.5mg  po qday Cont Xarelto Defer to cardiology as to continuation of Flecainide Please call for Cardiology consult in AM, appreciate input  Hyponatremia Thought secondary to hydrochlorothiazide, stopped during past admission Check serum osm, cortisol Check urine osm, urine sodium Hydrate gently with ns iv Check cmp  in am  Hypertension Decrease Benicar to 20mg  po qday while on Cardizem GTT  Anxiety Cont Xanax 0.25mg  po tid prn   DVT Prophylaxis-   Xarelto  AM Labs Ordered, also please review Full Orders  Family Communication: Admission, patients condition and plan of care including tests being ordered have been discussed with the patient  who indicate understanding and agree with the plan and Code Status.  Code Status:  FULL CODE  Admission status: Inpatient: Based on patients clinical presentation and evaluation of above clinical data, I have made determination that patient meets Inpatient criteria at this time.  Pt requires iv cardizem for rate control of Afib with RVR,  Pt will need >2 nites admission for Afib with RVR, to ensure rate control ,  Since has had 3 episodes in the past 1 week, cardioversion x1, cardizem GTT/ Flecaine/ Lopressor.  Pt has complicated recurrent Pafib, and due to complexity of her situation, requires  inpatient admission   Time spent in minutes : 60 minutes critical care    Jani Gravel M.D on 02/08/2019 at 2:30 AM

## 2019-02-08 NOTE — ED Notes (Signed)
Spoke with Dr. Georges Mouse about pt's heart rate ranging from 52-60, he stated to titrate the Cardizem to 5mg /hr

## 2019-02-08 NOTE — Discharge Instructions (Signed)
YOUR CARDIOLOGY TEAM HAS ARRANGED FOR AN E-VISIT FOR YOUR APPOINTMENT - PLEASE REVIEW IMPORTANT INFORMATION BELOW SEVERAL DAYS PRIOR TO YOUR APPOINTMENT  Due to the recent COVID-19 pandemic, we are transitioning in-person office visits to tele-medicine visits in an effort to decrease unnecessary exposure to our patients, their families, and staff. These visits are billed to your insurance just like a normal visit is. We also encourage you to sign up for MyChart if you have not already done so. You will need a smartphone if possible. For patients that do not have this, we can still complete the visit using a regular telephone but do prefer a smartphone to enable video when possible. You may have a family member that lives with you that can help. If possible, we also ask that you have a blood pressure cuff and scale at home to measure your blood pressure, heart rate and weight prior to your scheduled appointment. Patients with clinical needs that need an in-person evaluation and testing will still be able to come to the office if absolutely necessary. If you have any questions, feel free to call our office.   2-3 DAYS BEFORE YOUR APPOINTMENT  You will receive a telephone call from one of our [facility] team members - your caller ID may say "Unknown caller." If this is a video visit, we will walk you through how to get the video launched on your phone. We will remind you check your blood pressure, heart rate and weight prior to your scheduled appointment. If you have an Apple Watch or Kardia, please upload any pertinent ECG strips the day before or morning of your appointment to [facility]. Our staff will also make sure you have reviewed the consent and agree to move forward with your scheduled tele-health visit.   THE DAY OF YOUR APPOINTMENT  Approximately 15 minutes prior to your scheduled appointment, you will receive a telephone call from one of [facility] team - your caller ID may say "Unknown caller."  Our  staff will confirm medications, vital signs for the day and any symptoms you may be experiencing. Please have this information available prior to the time of visit start. It may also be helpful for you to have a pad of paper and pen handy for any instructions given during your visit. They will also walk you through joining the smartphone meeting if this is a video visit.  CONSENT FOR TELE-HEALTH VISIT - PLEASE REVIEW  I hereby voluntarily request, consent and authorize [facility] and its employed or contracted physicians, physician assistants, nurse practitioners or other licensed health care professionals (the Practitioner), to provide me with telemedicine health care services (the Services") as deemed necessary by the treating Practitioner. I acknowledge and consent to receive the Services by the Practitioner via telemedicine. I understand that the telemedicine visit will involve communicating with the Practitioner through live audiovisual communication technology and the disclosure of certain medical information by electronic transmission. I acknowledge that I have been given the opportunity to request an in-person assessment or other available alternative prior to the telemedicine visit and am voluntarily participating in the telemedicine visit.  I understand that I have the right to withhold or withdraw my consent to the use of telemedicine in the course of my care at any time, without affecting my right to future care or treatment, and that the Practitioner or I may terminate the telemedicine visit at any time. I understand that I have the right to inspect all information obtained and/or recorded in the  course of the telemedicine visit and may receive copies of available information for a reasonable fee.  I understand that some of the potential risks of receiving the Services via telemedicine include:   Delay or interruption in medical evaluation due to technological equipment failure or  disruption;  Information transmitted may not be sufficient (e.g. poor resolution of images) to allow for appropriate medical decision making by the Practitioner; and/or   In rare instances, security protocols could fail, causing a breach of personal health information.  Furthermore, I acknowledge that it is my responsibility to provide information about my medical history, conditions and care that is complete and accurate to the best of my ability. I acknowledge that Practitioner's advice, recommendations, and/or decision may be based on factors not within their control, such as incomplete or inaccurate data provided by me or distortions of diagnostic images or specimens that may result from electronic transmissions. I understand that the practice of medicine is not an exact science and that Practitioner makes no warranties or guarantees regarding treatment outcomes. I acknowledge that I will receive a copy of this consent concurrently upon execution via email to the email address I last provided but may also request a printed copy by calling the office of [facility].    I understand that my insurance will be billed for this visit.   I have read or had this consent read to me.  I understand the contents of this consent, which adequately explains the benefits and risks of the Services being provided via telemedicine.   I have been provided ample opportunity to ask questions regarding this consent and the Services and have had my questions answered to my satisfaction.  I give my informed consent for the services to be provided through the use of telemedicine in my medical care  By participating in this telemedicine visit I agree to the above.  =====================================================================================   You have a Stress Test scheduled at [facility]. Your doctor has ordered this test to check the blood flow in your heart arteries.  Please  arrive 15 minutes early for paperwork. The whole test will take several hours. You may want to bring reading material to remain occupied while undergoing different parts of the test.  Instructions:  No food/drink after midnight the night before.  It is OK to take your morning meds with a sip of water EXCEPT for those types of medicines listed below or otherwise instructed.  No caffeine/decaf products 24 hours before, including medicines such as Excedrin or Goody Powders. Call if there are any questions.   Wear comfortable clothes and shoes.   Special Medication Instructions:  Beta blockers such as metoprolol (Lopressor/Toprol XL), atenolol (Tenormin), carvedilol (Coreg), nebivolol (Bystolic), bisoprolol (Zebeta), propranolol (Inderal) should not be taken for 24 hours before the test.  Calcium channel blockers such as diltiazem (Cardizem) or verapmil (Calan) should not be taken for 24 hours before the test.  Remove nitroglycerin patches and do not take nitrate preparations such as Imdur/isosorbide the day of your test.  No Persantine/Theophylline or Aggrenox medicines should be used within 24 hours of the test.   If you are diabetic, please ask which medications to hold the day of the test.  What To Expect: When you arrive in the lab, the technician will inject a small amount of radioactive tracer into your arm through an IV while you are resting quietly. This helps [place] to form pictures of your heart. You will likely only feel a  sting from the IV. After a waiting period, resting pictures will be obtained under a big camera. These are the "before" pictures.  Next, you will be prepped for the stress portion of the test. This may include either walking on a treadmill or receiving a medicine that helps to dilate blood vessels in your heart to simulate the effect of exercise on your heart. If you are walking on a treadmill, you will walk at different paces to try to get your heart rate to a goal  number that is based on your age. If your doctor has chosen the pharmacologic test, then you will receive a medicine through your IV that may cause temporary nausea, flushing, shortness of breath and sometimes chest discomfort or vomiting. This is typically short-lived and usually resolves quickly. If you experience symptoms, that does not automatically mean the test is abnormal. Some patients do not experience any symptoms at all. Your blood pressure and heart rate will be monitored, and we will be watching your EKG on a computer screen for any changes. During this portion of the test, the radiologist will inject another small amount of radioactive tracer into your IV. After a waiting period, you will undergo a second set of pictures. These are the "after" pictures.  The doctor reading the test will compare the before-and-after images to look for evidence of heart blockages or heart weakness. The test usually takes 1 day to complete, but in certain instances (for example, if a patient is over a certain weight limit), the test may be done over the span of 2 days.   Additional discharge instructions  Please get your medications reviewed and adjusted by your Primary MD.  Please request your Primary MD to go over all Hospital Tests and Procedure/Radiological results at the follow up, please get all Hospital records sent to your Prim MD by signing hospital release before you go home.  If you had Pneumonia of Lung problems at the Hospital: Please get a 2 view Chest X ray done in 6-8 weeks after hospital discharge or sooner if instructed by your Primary MD.  If you have Congestive Heart Failure: Please call your Cardiologist or Primary MD anytime you have any of the following symptoms:  1) 3 pound weight gain in 24 hours or 5 pounds in 1 week  2) shortness of breath, with or without a dry hacking cough  3) swelling in the hands, feet or stomach  4) if you have to sleep on extra pillows at night in order  to breathe  Follow cardiac low salt diet and 1.5 lit/day fluid restriction.  If you have diabetes Accuchecks 4 times/day, Once in AM empty stomach and then before each meal. Log in all results and show them to your primary doctor at your next visit. If any glucose reading is under 80 or above 300 call your primary MD immediately.  If you have Seizure/Convulsions/Epilepsy: Please do not drive, operate heavy machinery, participate in activities at heights or participate in high speed sports until you have seen by Primary MD or a Neurologist and advised to do so again.  If you had Gastrointestinal Bleeding: Please ask your Primary MD to check a complete blood count within one week of discharge or at your next visit. Your endoscopic/colonoscopic biopsies that are pending at the time of discharge, will also need to followed by your Primary MD.  Get Medicines reviewed and adjusted. Please take all your medications with you for your next visit with your Primary  MD  Please request your Primary MD to go over all hospital tests and procedure/radiological results at the follow up, please ask your Primary MD to get all Hospital records sent to his/her office.  If you experience worsening of your admission symptoms, develop shortness of breath, life threatening emergency, suicidal or homicidal thoughts you must seek medical attention immediately by calling 911 or calling your MD immediately  if symptoms less severe.  You must read complete instructions/literature along with all the possible adverse reactions/side effects for all the Medicines you take and that have been prescribed to you. Take any new Medicines after you have completely understood and accpet all the possible adverse reactions/side effects.   Do not drive or operate heavy machinery when taking Pain medications.   Do not take more than prescribed Pain, Sleep and Anxiety Medications  Special Instructions: If you have smoked or chewed  Tobacco  in the last 2 yrs please stop smoking, stop any regular Alcohol  and or any Recreational drug use.  Wear Seat belts while driving.  Please note You were cared for by a hospitalist during your hospital stay. If you have any questions about your discharge medications or the care you received while you were in the hospital after you are discharged, you can call the unit and asked to speak with the hospitalist on call if the hospitalist that took care of you is not available. Once you are discharged, your primary care physician will handle any further medical issues. Please note that NO REFILLS for any discharge medications will be authorized once you are discharged, as it is imperative that you return to your primary care physician (or establish a relationship with a primary care physician if you do not have one) for your aftercare needs so that they can reassess your need for medications and monitor your lab values.  You can reach the hospitalist office at phone [phone] or fax [phone]   If you do not have a primary care physician, you can call [phone] for a physician referral.

## 2019-02-08 NOTE — Care Management CC44 (Signed)
Condition Code 44 Documentation Completed  Patient Details  Name: Melissa [name] MRN: [id number] Date of Birth: [birth year]   Condition Code 44 given:  Yes Patient signature on Condition Code 44 notice:  Yes Documentation of 2 MD's agreement:  Yes Code 44 added to claim:  Yes    Claudie Leach, RN 02/08/2019, 3:13 PM

## 2019-02-08 NOTE — ED Provider Notes (Signed)
[facility] EMERGENCY DEPARTMENT Provider Note   CSN: [id number] Arrival date & time: 02/07/19  2227    History   Chief Complaint Chief Complaint  Patient presents with  . Palpitations    HPI Melissa [name] is a 68 y.o. female.     Patient presents with palpitations.  Patient has a history of atrial fib and was cardioverted once and then put on flecainide.  She is just recently discharged from the hospital  The history is provided by the patient. No language interpreter was used.  Palpitations  Palpitations quality:  Irregular Onset quality:  Sudden Timing:  Constant Progression:  Worsening Chronicity:  Recurrent Context: not anxiety   Relieved by:  Nothing Worsened by:  Nothing Ineffective treatments:  None tried Associated symptoms: no back pain, no chest pain and no cough     Past Medical History:  Diagnosis Date  . Atrial fibrillation ([place])   . 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 Hypertension   . Hyperthyroidism    remote  . Uterine cancer [facility])     Patient Active Problem List   Diagnosis Date Noted  . Atrial fibrillation with RVR ([place]) 08/31/2017  . Hypertensive urgency 08/31/2017  . Cognitive deficits, late effect of cerebrovascular disease 05/24/2014  . Abdominal pain, unspecified site 04/26/2014  . Hyperglycemia 02/17/2014  . Severe obesity (BMI >= 40) ([place]) 01/28/2014  . Aphasia due to recent cerebrovascular accident 01/07/2014  . History of small thalamic hemorrhage with stroke 2015 12/02/2013  . ADENOCARCINOMA, ENDOMETRIUM 08/11/2007  . History of hyperthyroidism 05/16/2007  . Essential hypertension 05/16/2007  . OSTEOARTHRITIS 05/16/2007    Past Surgical History:  Procedure Laterality Date  . ABDOMINAL HYSTERECTOMY    . CHOLECYSTECTOMY    . COLONOSCOPY N/A 05/12/2014   Procedure: COLONOSCOPY;  Surgeon: Daneil Dolin, MD;  Location: [facility] ENDO SUITE;  Service: Endoscopy;  Laterality: N/A;  1130  . COLONOSCOPY N/A 07/26/2017   Procedure: COLONOSCOPY;  Surgeon: Daneil Dolin, MD;  Location: [facility] ENDO SUITE;  Service: Endoscopy;  Laterality: N/A;  1:00 pm  . REPLACEMENT TOTAL KNEE BILATERAL     bilateral knees replaced twice     OB History   No obstetric history on file.      Home Medications    Prior to Admission medications   Medication Sig Start Date End Date Taking? Authorizing Provider  acetaminophen (TYLENOL) 500 MG tablet Take 500 mg by mouth as needed.     [provider]  ALPRAZolam Duanne Moron) 0.25 MG tablet Take 1 tablet (0.25 mg total) by mouth 3 (three) times daily as needed for anxiety or sleep. 02/05/19   Manuella Ghazi, Pratik D, DO  flecainide (TAMBOCOR) 50 MG tablet Take 1 tablet (50 mg total) by mouth every 12 (twelve) hours for 30 days. 02/05/19 03/07/19  Manuella Ghazi, Pratik D, DO  metoprolol succinate (TOPROL XL) 25 MG 24 hr tablet Take 0.5 tablets (12.5 mg total) by mouth daily. 02/05/19 02/05/20  Manuella Ghazi, Pratik D, DO  Multiple Vitamins-Minerals (MULTIVITAMIN WITH MINERALS) tablet Take 1 tablet by mouth daily.    [provider]  olmesartan (BENICAR) 40 MG tablet Take 40 mg by mouth daily.  12/01/18   [provider]  rivaroxaban (XARELTO) 20 MG TABS tablet Take 1 tablet (20 mg total) by mouth daily with supper. 02/01/19   Kem Parkinson, PA-C    Family History Family History  Problem Relation Age of Onset  . CVA Maternal Grandfather 32  . Colon cancer Neg  Hx     Social History Social History   Tobacco Use  . Smoking status: Never Smoker  . Smokeless tobacco: Never Used  . Tobacco comment: Never smoked  Substance Use Topics  . Alcohol use: No    Alcohol/week: 0.0 standard drinks  . Drug use: No     Allergies   Patient has no known allergies.   Review of Systems Review of Systems  Constitutional: Negative for appetite change and fatigue.  HENT: Negative for congestion, ear discharge and sinus pressure.   Eyes: Negative for discharge.  Respiratory: Negative for cough.    Cardiovascular: Positive for palpitations. Negative for chest pain.  Gastrointestinal: Negative for abdominal pain and diarrhea.  Genitourinary: Negative for frequency and hematuria.  Musculoskeletal: Negative for back pain.  Skin: Negative for rash.  Neurological: Negative for seizures and headaches.  Psychiatric/Behavioral: Negative for hallucinations.     Physical Exam Updated Vital Signs BP (!) 151/82   Pulse (!) 107   Temp 98.1 F (36.7 C) (Oral)   Resp 11   Ht 5\' 7"  (1.702 m)   Wt 65.8 kg   SpO2 100%   BMI 22.71 kg/m   Physical Exam Vitals signs and nursing note reviewed.  Constitutional:      Appearance: She is well-developed.  HENT:     Head: Normocephalic.     Nose: Nose normal.  Eyes:     General: No scleral icterus.    Conjunctiva/sclera: Conjunctivae normal.  Neck:     Musculoskeletal: Neck supple.     Thyroid: No thyromegaly.  Cardiovascular:     Heart sounds: No murmur. No friction rub. No gallop.      Comments: Rapid irregular heartbeat Pulmonary:     Breath sounds: No stridor. No wheezing or rales.  Chest:     Chest wall: No tenderness.  Abdominal:     General: There is no distension.     Tenderness: There is no abdominal tenderness. There is no rebound.  Musculoskeletal: Normal range of motion.  Lymphadenopathy:     Cervical: No cervical adenopathy.  Skin:    Findings: No erythema or rash.  Neurological:     Mental Status: She is oriented to person, place, and time.     Motor: No abnormal muscle tone.     Coordination: Coordination normal.  Psychiatric:        Behavior: Behavior normal.      ED Treatments / Results  Labs (all labs ordered are listed, but only abnormal results are displayed) Labs Reviewed  BASIC METABOLIC PANEL - Abnormal; Notable for the following components:      Result Value   Sodium 129 (*)    Chloride 88 (*)    Glucose, Bld 141 (*)    All other components within normal limits  SARS CORONAVIRUS 2 (HOSPITAL  ORDER, [facility])  MAGNESIUM  CBC  TSH    EKG EKG Interpretation  Date/Time:  Saturday Feb 07 2019 22:45:08 EDT Ventricular Rate:  153 PR Interval:    QRS Duration: 103 QT Interval:  293 QTC Calculation: 475 R Axis:   -90 Text Interpretation:  Atrial fibrillation with rapid V-rate Borderline low voltage, extremity leads LVH with secondary repolarization abnormality Abnormal inferior Q waves ST depression, probably rate related Baseline wander in lead(s) V6 Confirmed by Isla Pence [phone]) on 02/07/2019 10:50:53 PM   Radiology Dg Chest Port 1 View  Result Date: 02/08/2019 CLINICAL DATA:  68 year old female with AFib. EXAM: PORTABLE CHEST  1 VIEW COMPARISON:  Chest radiograph dated 02/01/2019 FINDINGS: There is no focal consolidation, pleural effusion, or pneumothorax. The cardiac silhouette is within normal limits. No acute osseous pathology. IMPRESSION: No active disease. Electronically Signed   By: Anner Crete M.D.   On: 02/08/2019 01:14    Procedures Procedures (including critical care time)  Medications Ordered in ED Medications  diltiazem (CARDIZEM) 100 mg in dextrose 5 % 100 mL (1 mg/mL) infusion (10 mg/hr Intravenous Rate/Dose Change 02/08/19 0002)  diltiazem (CARDIZEM) injection 10 mg (10 mg Intravenous Given 02/07/19 2309)  metoprolol tartrate (LOPRESSOR) injection 5 mg (5 mg Intravenous Given 02/08/19 0030)     Initial Impression / Assessment and Plan / ED Course  I have reviewed the triage vital signs and the nursing notes.  Pertinent labs & imaging results that were available during my care of the patient were reviewed by me and considered in my medical decision making (see chart for details).        CRITICAL CARE Performed by: Milton Ferguson Total critical care time: 45 minutes Critical care time was exclusive of separately billable procedures and treating other patients. Critical care was necessary to treat or prevent imminent  or life-threatening deterioration. Critical care was time spent personally by me on the following activities: development of treatment plan with patient and/or surrogate as well as nursing, discussions with consultants, evaluation of patient's response to treatment, examination of patient, obtaining history from patient or surrogate, ordering and performing treatments and interventions, ordering and review of laboratory studies, ordering and review of radiographic studies, pulse oximetry and re-evaluation of patient's condition.  Patient was placed on a Cardizem drip along with Lopressor IV.  Her rate slowed down to about 100 and atrial for.  She will be admitted to medicine Final Clinical Impressions(s) / ED Diagnoses   Final diagnoses:  Palpitations    ED Discharge Orders    None       Milton Ferguson, MD 02/08/19 0128

## 2019-02-08 NOTE — Discharge Summary (Signed)
Physician Discharge Summary  NAOMA BOXELL NIO:[id number] DOB: 10/23/1950  PCP: Asencion Noble, MD  Admit date: 02/07/2019 Discharge date: 02/08/2019  Recommendations for Outpatient Follow-up:  1. Dr. Asencion Noble, PCP in 1 week with repeat labs (CBC & BMP). 2. Dr. Allegra Lai, EP Cardiology: Office will call patient next week to arrange for exercise stress test and follow-up.  Home Health: None Equipment/Devices: None  Discharge Condition: Improved and stable CODE STATUS: Full Diet recommendation: Heart healthy diet.  Discharge Diagnoses:  Principal Problem:   Atrial fibrillation with RVR ([place]) Active Problems:   Essential hypertension   Hyponatremia   Palpitations   Atrial fibrillation [facility])   Brief Summary: 68 year old female with PMH of PAF, small left thalamic hemorrhage 2015 felt to be due to uncontrolled hypertension, seen recently in ED 02/01/2019 for A. fib with RVR and underwent electrocardioversion and discharged on metoprolol and Xarelto followed by admission to the hospital 5/5-5/7 for symptomatic recurrent A. fib with RVR, treated with Cardizem drip and flecainide then reverted to sinus rhythm, seen by cardiology, discharged home on flecainide, Toprol-XL presented back to [facility] on 02/07/2019 due to palpitations and noted to be in A. fib with RVR in the 150s.  Reverted to sinus rhythm on 5/10.  Cardiology consulted.   Assessment & Plan:  1. Paroxysmal A. fib with RVR: Status post cardioversion in ED 5/3.  Hospitalized for same 5/5-5/7, reportedly refused DCCV, cardioverted after a dose of flecainide 300 mg, had some issues with bradycardia but then discharged on above doses of flecainide and Toprol-XL.  Compliant with discharge medications.  Presented with palpitations and home heart rate of 180 on home BP monitor. She was started on Cardizem drip and converted to sinus rhythm early this morning. Cardiology was consulted and their input appreciated.  I discussed with Dr. Meda Coffee  in detail.  She has discussed with EP Cardiology who will call patient as outpatient next week to evaluate for A. fib ablation.  As per cardiology, she would be a great candidate for RF ablation given significant weight loss, easy cardioversion back to SR, normal LVEF and only mild LA dilatation.  As per cardiology recommendations, Toprol-XL was increased from 12.5 mg daily to 25 mg daily, Flecainide was increased from 50 mg to 100 mg twice daily.  She is not a good Cardizem candidate as she tends to become bradycardic.  Continue Xarelto.  Cardiology cleared her for discharge home today. 2. Essential hypertension: Uncontrolled.  Increased Toprol-XL to 25 mg daily.  Continue home dose of Benicar 40 mg daily.  Chlorthalidone recently discontinued due to hyponatremia.  Monitor closely as outpatient. 3. Hyponatremia: Clinically appears euvolemic.  Possibly related to recent use of thiazide diuretics, now discontinued.  Sodium stable in the 129 range.  Discontinued IV fluids.  Follow BMP as outpatient. 4. Anxiety disorder: Continue Xanax as needed. 5. H/o hemorrhagic CVA 2015: No residual deficits.  Neurology has reportedly cleared patient for anticoagulation.   Consultants:  Cardiology  Procedures:  None   Discharge Instructions  Discharge Instructions    Call MD for:   Complete by:  As directed    Racing of heart or palpitations.   Call MD for:  difficulty breathing, headache or visual disturbances   Complete by:  As directed    Call MD for:  extreme fatigue   Complete by:  As directed    Call MD for:  persistant dizziness or light-headedness   Complete by:  As directed    Diet - low  sodium heart healthy   Complete by:  As directed    Increase activity slowly   Complete by:  As directed        Medication List    TAKE these medications   acetaminophen 500 MG tablet Commonly known as:  TYLENOL Take 500 mg by mouth as needed for mild pain.   ALPRAZolam 0.25 MG tablet Commonly  known as:  XANAX Take 1 tablet (0.25 mg total) by mouth 3 (three) times daily as needed for anxiety or sleep.   flecainide 50 MG tablet Commonly known as:  TAMBOCOR Take 2 tablets (100 mg total) by mouth 2 (two) times daily for 30 days. What changed:    how much to take  when to take this   metoprolol succinate 25 MG 24 hr tablet Commonly known as:  Toprol XL Take 1 tablet (25 mg total) by mouth daily. What changed:  how much to take   multivitamin with minerals tablet Take 1 tablet by mouth daily.   olmesartan 40 MG tablet Commonly known as:  BENICAR Take 40 mg by mouth daily.   rivaroxaban 20 MG Tabs tablet Commonly known as:  XARELTO Take 1 tablet (20 mg total) by mouth daily with supper.      Follow-up Information    [place] [facility] Follow up.   Specialty:  Cardiology Why:  You will be called to arrange an exercise stress test for Friday.  Contact information: [address] [place]       Constance Haw, MD Follow up.   Specialty:  Cardiology Why:  You will be called to arragne for a virtual visit later this week. Please see attached instructions on virtual visits.  Contact information: [address] [place] [address] [phone]        Asencion Noble, MD. Schedule an appointment as soon as possible for a visit in 1 week(s).   Specialty:  Internal Medicine Why:  To be seen with repeat labs (CBC & BMP). Contact information: [address] [place] [address] [phone]        Herminio Commons, MD .   Specialty:  Cardiology Contact information: [place] [address] [phone]          No Known Allergies    Procedures/Studies: Dg Chest Port 1 View  Result Date: 02/08/2019 CLINICAL DATA:  68 year old female with AFib. EXAM: PORTABLE CHEST 1 VIEW COMPARISON:  Chest radiograph dated 02/01/2019 FINDINGS: There is no focal  consolidation, pleural effusion, or pneumothorax. The cardiac silhouette is within normal limits. No acute osseous pathology. IMPRESSION: No active disease. Electronically Signed   By: Anner Crete M.D.   On: 02/08/2019 01:14    Subjective: Patient reports that at home she had palpitations and BP monitor revealed heart rate in the 180s.  She denied associated dizziness, lightheadedness, chest pain or dyspnea.  Palpitations have now resolved.  "I am in sinus rhythm now".  Feels tired but denies any other complaints.  As per RN, extremely anxious this morning, improved after Xanax.  Had a large BM.  Tolerating diet.  Discharge Exam:  Vitals:   02/08/19 0418 02/08/19 0430 02/08/19 0500 02/08/19 0800  BP: (!) 179/71 (!) 160/82 (!) 162/69 (!) 176/68  Pulse: 60 (!) 59 (!) 53 (!) 50  Resp: 15 20 14 13   Temp: 98.6 F (37 C)   97.7 F (36.5 C)  TempSrc: Oral  Oral  SpO2: 100% 100% 100% 100%  Weight: 64.7 kg     Height: 5' 7.5" (1.715 m)       General exam: Pleasant middle-aged female, moderately built and nourished lying comfortably propped up in bed without distress.  Oral mucosa moist. Respiratory system: Clear to auscultation. Respiratory effort normal. Cardiovascular system: S1 & S2 heard, RRR. No JVD, murmurs, rubs, gallops or clicks. No pedal edema.  Telemetry personally reviewed: SB in the 60s. Gastrointestinal system: Abdomen is nondistended, soft and nontender. No organomegaly or masses felt. Normal bowel sounds heard. Central nervous system: Alert and oriented. No focal neurological deficits. Extremities: Symmetric 5 x 5 power. Skin: No rashes, lesions or ulcers Psychiatry: Judgement and insight appear normal. Mood & affect appropriate.     The results of significant diagnostics from this hospitalization (including imaging, microbiology, ancillary and laboratory) are listed below for reference.     Microbiology: Recent Results (from the past 240 hour(s))  SARS Coronavirus  2 (CEPHEID - Performed in [place] hospital lab), Hosp Order     Status: None   Collection Time: 02/03/19 12:09 PM  Result Value Ref Range Status   SARS Coronavirus 2 NEGATIVE NEGATIVE Final    Comment: (NOTE) If result is NEGATIVE SARS-CoV-2 target nucleic acids are NOT DETECTED. The SARS-CoV-2 RNA is generally detectable in upper and lower  respiratory specimens during the acute phase of infection. The lowest  concentration of SARS-CoV-2 viral copies this assay can detect is 250  copies / mL. A negative result does not preclude SARS-CoV-2 infection  and should not be used as the sole basis for treatment or other  patient management decisions.  A negative result may occur with  improper specimen collection / handling, submission of specimen other  than nasopharyngeal swab, presence of viral mutation(s) within the  areas targeted by this assay, and inadequate number of viral copies  (<250 copies / mL). A negative result must be combined with clinical  observations, patient history, and epidemiological information. If result is POSITIVE SARS-CoV-2 target nucleic acids are DETECTED. The SARS-CoV-2 RNA is generally detectable in upper and lower  respiratory specimens dur ing the acute phase of infection.  Positive  results are indicative of active infection with SARS-CoV-2.  Clinical  correlation with patient history and other diagnostic information is  necessary to determine patient infection status.  Positive results do  not rule out bacterial infection or co-infection with other viruses. If result is PRESUMPTIVE POSTIVE SARS-CoV-2 nucleic acids MAY BE PRESENT.   A presumptive positive result was obtained on the submitted specimen  and confirmed on repeat testing.  While 2019 novel coronavirus  (SARS-CoV-2) nucleic acids may be present in the submitted sample  additional confirmatory testing may be necessary for epidemiological  and / or clinical management purposes  to  differentiate between  SARS-CoV-2 and other Sarbecovirus currently known to infect humans.  If clinically indicated additional testing with an alternate test  methodology [phone]) is advised. The SARS-CoV-2 RNA is generally  detectable in upper and lower respiratory sp ecimens during the acute  phase of infection. The expected result is Negative. Fact Sheet for Patients:  StrictlyIdeas.no Fact Sheet for Healthcare Providers: BankingDealers.co.za This test is not yet approved or cleared by the [place] FDA and has been authorized for detection and/or diagnosis of SARS-CoV-2 by FDA under an Emergency Use Authorization (EUA).  This EUA will remain in effect (meaning this test can be used) for the duration of the COVID-19 declaration under  Section 564(b)(1) of the Act, 21 U.S.C. section 360bbb-3(b)(1), unless the authorization is terminated or revoked sooner. Performed at [facility], [address] [place], [place] [address]   MRSA PCR Screening     Status: None   Collection Time: 02/03/19  4:43 PM  Result Value Ref Range Status   MRSA by PCR NEGATIVE NEGATIVE Final    Comment:        The GeneXpert MRSA Assay (FDA approved for NASAL specimens only), is one component of a comprehensive MRSA colonization surveillance program. It is not intended to diagnose MRSA infection nor to guide or monitor treatment for MRSA infections. Performed at [facility], [address], [place], [place] [address]   SARS Coronavirus 2 (CEPHEID - Performed in [facility] hospital lab), Hosp Order     Status: None   Collection Time: 02/08/19 12:42 AM  Result Value Ref Range Status   SARS Coronavirus 2 NEGATIVE NEGATIVE Final    Comment: (NOTE) If result is NEGATIVE SARS-CoV-2 target nucleic acids are NOT DETECTED. The SARS-CoV-2 RNA is generally detectable in upper and lower  respiratory specimens during the acute phase of infection. The lowest   concentration of SARS-CoV-2 viral copies this assay can detect is 250  copies / mL. A negative result does not preclude SARS-CoV-2 infection  and should not be used as the sole basis for treatment or other  patient management decisions.  A negative result may occur with  improper specimen collection / handling, submission of specimen other  than nasopharyngeal swab, presence of viral mutation(s) within the  areas targeted by this assay, and inadequate number of viral copies  (<250 copies / mL). A negative result must be combined with clinical  observations, patient history, and epidemiological information. If result is POSITIVE SARS-CoV-2 target nucleic acids are DETECTED. The SARS-CoV-2 RNA is generally detectable in upper and lower  respiratory specimens dur ing the acute phase of infection.  Positive  results are indicative of active infection with SARS-CoV-2.  Clinical  correlation with patient history and other diagnostic information is  necessary to determine patient infection status.  Positive results do  not rule out bacterial infection or co-infection with other viruses. If result is PRESUMPTIVE POSTIVE SARS-CoV-2 nucleic acids MAY BE PRESENT.   A presumptive positive result was obtained on the submitted specimen  and confirmed on repeat testing.  While 2019 novel coronavirus  (SARS-CoV-2) nucleic acids may be present in the submitted sample  additional confirmatory testing may be necessary for epidemiological  and / or clinical management purposes  to differentiate between  SARS-CoV-2 and other Sarbecovirus currently known to infect humans.  If clinically indicated additional testing with an alternate test  methodology [phone]) is advised. The SARS-CoV-2 RNA is generally  detectable in upper and lower respiratory sp ecimens during the acute  phase of infection. The expected result is Negative. Fact Sheet for Patients:  StrictlyIdeas.no Fact Sheet  for Healthcare Providers: BankingDealers.co.za This test is not yet approved or cleared by the [place] FDA and has been authorized for detection and/or diagnosis of SARS-CoV-2 by FDA under an Emergency Use Authorization (EUA).  This EUA will remain in effect (meaning this test can be used) for the duration of the COVID-19 declaration under Section 564(b)(1) of the Act, 21 U.S.C. section 360bbb-3(b)(1), unless the authorization is terminated or revoked sooner. Performed at [facility], [address] [place], [place] [address]      Labs: CBC: Recent Labs  Lab 02/03/19 1042 02/04/19 [phone]  02/05/19 0444 02/07/19 2245  WBC 6.3 6.5 6.9 6.3  NEUTROABS 4.8 4.4 4.3  --   HGB 14.1 13.6 12.0 13.7  HCT 38.9 39.5 35.2* 39.5  MCV 92.8 94.7 96.7 95.0  PLT 167 168 141* 163   Basic Metabolic Panel: Recent Labs  Lab 02/03/19 1042 02/04/19 0432 02/05/19 0444 02/07/19 2245  NA 131* 129* 129* 129*  K 3.8 4.0 3.5 3.6  CL 94* 90* 91* 88*  CO2 25 28 27 27   GLUCOSE 120* 92 79 141*  BUN 16 15 28* 23  CREATININE 0.75 0.75 0.97 0.72  CALCIUM 10.1 9.5 9.3 10.1  MG 1.8 1.8 1.8 1.9   Liver Function Tests: Recent Labs  Lab 02/04/19 0432 02/05/19 0444  AST 30 24  ALT 26 23  ALKPHOS 89 84  BILITOT 1.7* 1.4*  PROT 7.3 6.4*  ALBUMIN 4.3 4.0   BNP (last 3 results) Recent Labs    02/04/19 0432 02/05/19 0444  BNP 128.0* 42.0   Cardiac Enzymes: Recent Labs  Lab 02/03/19 1042 02/03/19 1535 02/03/19 2044 02/08/19 0219  TROPONINI 0.05* 0.05* 0.04* 0.04*   Thyroid function studies Recent Labs    02/07/19 2245  TSH 2.635   I discussed in detail with patient and spouse regarding the entire above care.  They verbalized understanding and both are agreeable for patient to be discharged home today with close outpatient follow-up with Cardiology.  Answered all questions.  Time coordinating discharge: 40 minutes  SIGNED:  Vernell Leep, MD, FACP,  [facility]. [facility]  To contact the attending provider between 7A-7P or the covering provider during after hours 7P-7A, please log into the web site [email or web address] and access using universal [place] password for that web site. If you do not have the password, please call the hospital operator.

## 2019-02-08 NOTE — Progress Notes (Addendum)
PROGRESS NOTE   Melissa [name]  WJX:[id number]    DOB: 06/08/1951    DOA: 02/07/2019  PCP: Asencion Noble, MD   I have briefly reviewed patients previous medical records in [facility].  Brief Narrative:  68 year old female with PMH of PAF, small left thalamic hemorrhage 2015 felt to be due to uncontrolled hypertension, seen recently in ED 02/01/2019 for A. fib with RVR and underwent electrocardioversion and discharged on metoprolol and Xarelto followed by admission to the hospital 5/5-5/7 for symptomatic recurrent A. fib with RVR, treated with Cardizem drip and flecainide then reverted to sinus rhythm, seen by cardiology, discharged home on flecainide, Toprol-XL presented back to [facility] on 02/07/2019 due to palpitations and noted to be in A. fib with RVR in the 150s.  Reverted to sinus rhythm on 5/10.  Cardiology consulted.   Assessment & Plan:   Principal Problem:   Atrial fibrillation with RVR (HCC) Active Problems:   Essential hypertension   Hyponatremia   1. Paroxysmal A. fib with RVR: Status post cardioversion in ED 5/3.  Hospitalized for same 5/5-5/7.  Compliant with discharge medications.  Presented with palpitations and home heart rate of 180 on BP monitor.  Continued on Toprol-XL 12.5 mg daily and Xarelto.  Started on Cardizem drip.  Patient reverted to sinus rhythm 5/10 and Cardizem drip discontinued.  Flecainide on hold until cardiology input.  Cardiology consulted. 2. Essential hypertension: Uncontrolled.  Continue Toprol-XL 12.5 mg daily.  Placed on reduced dose ARB, increase to prior home dose.  Amlodipine and chlorthalidone discontinued during recent admission due to hyponatremia.  PRN IV hydralazine. 3. Hyponatremia: Clinically appears euvolemic.  Possibly related to recent use of thiazide diuretics, now discontinued.  Sodium stable in the 129 range.  Discontinued IV fluids. 4. Anxiety disorder: Continue Xanax as needed. 5. H/o hemorrhagic CVA 2015:   DVT prophylaxis:  Currently on Xarelto. Code Status: Full. Family Communication: I discussed in detail with patient spouse, updated care and answer questions. Disposition: DC home pending clinical improvement.   Consultants:  Cardiology  Procedures:  None  Antimicrobials:  None   Subjective: Patient reports that at home she had palpitations and BP monitor revealed heart rate in the 180s.  She denied associated dizziness, lightheadedness, chest pain or dyspnea.  Palpitations have now resolved.  "I am in sinus rhythm now".  Feels tired but denies any other complaints.  As per RN, extremely anxious this morning, improved after Xanax.  Had a large BM.  Tolerating diet.  ROS: As above, otherwise negative.  Objective:  Vitals:   02/08/19 0418 02/08/19 0430 02/08/19 0500 02/08/19 0800  BP: (!) 179/71 (!) 160/82 (!) 162/69 (!) 176/68  Pulse: 60 (!) 59 (!) 53 (!) 50  Resp: 15 20 14 13   Temp: 98.6 F (37 C)   97.7 F (36.5 C)  TempSrc: Oral   Oral  SpO2: 100% 100% 100% 100%  Weight: 64.7 kg     Height: 5' 7.5" (1.715 m)       Examination:  General exam: Pleasant middle-aged female, moderately built and nourished lying comfortably propped up in bed without distress.  Oral mucosa moist. Respiratory system: Clear to auscultation. Respiratory effort normal. Cardiovascular system: S1 & S2 heard, RRR. No JVD, murmurs, rubs, gallops or clicks. No pedal edema.  Telemetry personally reviewed: SB in the 40s. Gastrointestinal system: Abdomen is nondistended, soft and nontender. No organomegaly or masses felt. Normal bowel sounds heard. Central nervous system: Alert and oriented. No focal neurological deficits. Extremities:  Symmetric 5 x 5 power. Skin: No rashes, lesions or ulcers Psychiatry: Judgement and insight appear normal. Mood & affect appropriate.     Data Reviewed: I have personally reviewed following labs and imaging studies  CBC: Recent Labs  Lab 02/03/19 1042 02/04/19 0432 02/05/19 0444  02/07/19 2245  WBC 6.3 6.5 6.9 6.3  NEUTROABS 4.8 4.4 4.3  --   HGB 14.1 13.6 12.0 13.7  HCT 38.9 39.5 35.2* 39.5  MCV 92.8 94.7 96.7 95.0  PLT 167 168 141* 903   Basic Metabolic Panel: Recent Labs  Lab 02/03/19 1042 02/04/19 0432 02/05/19 0444 02/07/19 2245  NA 131* 129* 129* 129*  K 3.8 4.0 3.5 3.6  CL 94* 90* 91* 88*  CO2 25 28 27 27   GLUCOSE 120* 92 79 141*  BUN 16 15 28* 23  CREATININE 0.75 0.75 0.97 0.72  CALCIUM 10.1 9.5 9.3 10.1  MG 1.8 1.8 1.8 1.9   Liver Function Tests: Recent Labs  Lab 02/04/19 0432 02/05/19 0444  AST 30 24  ALT 26 23  ALKPHOS 89 84  BILITOT 1.7* 1.4*  PROT 7.3 6.4*  ALBUMIN 4.3 4.0   Cardiac Enzymes: Recent Labs  Lab 02/03/19 1042 02/03/19 1535 02/03/19 2044 02/08/19 0219  TROPONINI 0.05* 0.05* 0.04* 0.04*     Recent Results (from the past 240 hour(s))  SARS Coronavirus 2 (CEPHEID - Performed in [place] hospital lab), Hosp Order     Status: None   Collection Time: 02/03/19 12:09 PM  Result Value Ref Range Status   SARS Coronavirus 2 NEGATIVE NEGATIVE Final    Comment: (NOTE) If result is NEGATIVE SARS-CoV-2 target nucleic acids are NOT DETECTED. The SARS-CoV-2 RNA is generally detectable in upper and lower  respiratory specimens during the acute phase of infection. The lowest  concentration of SARS-CoV-2 viral copies this assay can detect is 250  copies / mL. A negative result does not preclude SARS-CoV-2 infection  and should not be used as the sole basis for treatment or other  patient management decisions.  A negative result may occur with  improper specimen collection / handling, submission of specimen other  than nasopharyngeal swab, presence of viral mutation(s) within the  areas targeted by this assay, and inadequate number of viral copies  (<250 copies / mL). A negative result must be combined with clinical  observations, patient history, and epidemiological information. If result is POSITIVE SARS-CoV-2  target nucleic acids are DETECTED. The SARS-CoV-2 RNA is generally detectable in upper and lower  respiratory specimens dur ing the acute phase of infection.  Positive  results are indicative of active infection with SARS-CoV-2.  Clinical  correlation with patient history and other diagnostic information is  necessary to determine patient infection status.  Positive results do  not rule out bacterial infection or co-infection with other viruses. If result is PRESUMPTIVE POSTIVE SARS-CoV-2 nucleic acids MAY BE PRESENT.   A presumptive positive result was obtained on the submitted specimen  and confirmed on repeat testing.  While 2019 novel coronavirus  (SARS-CoV-2) nucleic acids may be present in the submitted sample  additional confirmatory testing may be necessary for epidemiological  and / or clinical management purposes  to differentiate between  SARS-CoV-2 and other Sarbecovirus currently known to infect humans.  If clinically indicated additional testing with an alternate test  methodology [phone]) is advised. The SARS-CoV-2 RNA is generally  detectable in upper and lower respiratory sp ecimens during the acute  phase of infection. The expected result is  Negative. Fact Sheet for Patients:  StrictlyIdeas.no Fact Sheet for Healthcare Providers: BankingDealers.co.za This test is not yet approved or cleared by the [place] FDA and has been authorized for detection and/or diagnosis of SARS-CoV-2 by FDA under an Emergency Use Authorization (EUA).  This EUA will remain in effect (meaning this test can be used) for the duration of the COVID-19 declaration under Section 564(b)(1) of the Act, 21 U.S.C. section 360bbb-3(b)(1), unless the authorization is terminated or revoked sooner. Performed at [facility], [address] [place], [place] [address]   MRSA PCR Screening     Status: None   Collection Time: 02/03/19  4:43 PM  Result  Value Ref Range Status   MRSA by PCR NEGATIVE NEGATIVE Final    Comment:        The GeneXpert MRSA Assay (FDA approved for NASAL specimens only), is one component of a comprehensive MRSA colonization surveillance program. It is not intended to diagnose MRSA infection nor to guide or monitor treatment for MRSA infections. Performed at [facility], [address], [place], [place] [address]   SARS Coronavirus 2 (CEPHEID - Performed in [facility] hospital lab), Hosp Order     Status: None   Collection Time: 02/08/19 12:42 AM  Result Value Ref Range Status   SARS Coronavirus 2 NEGATIVE NEGATIVE Final    Comment: (NOTE) If result is NEGATIVE SARS-CoV-2 target nucleic acids are NOT DETECTED. The SARS-CoV-2 RNA is generally detectable in upper and lower  respiratory specimens during the acute phase of infection. The lowest  concentration of SARS-CoV-2 viral copies this assay can detect is 250  copies / mL. A negative result does not preclude SARS-CoV-2 infection  and should not be used as the sole basis for treatment or other  patient management decisions.  A negative result may occur with  improper specimen collection / handling, submission of specimen other  than nasopharyngeal swab, presence of viral mutation(s) within the  areas targeted by this assay, and inadequate number of viral copies  (<250 copies / mL). A negative result must be combined with clinical  observations, patient history, and epidemiological information. If result is POSITIVE SARS-CoV-2 target nucleic acids are DETECTED. The SARS-CoV-2 RNA is generally detectable in upper and lower  respiratory specimens dur ing the acute phase of infection.  Positive  results are indicative of active infection with SARS-CoV-2.  Clinical  correlation with patient history and other diagnostic information is  necessary to determine patient infection status.  Positive results do  not rule out bacterial infection or co-infection  with other viruses. If result is PRESUMPTIVE POSTIVE SARS-CoV-2 nucleic acids MAY BE PRESENT.   A presumptive positive result was obtained on the submitted specimen  and confirmed on repeat testing.  While 2019 novel coronavirus  (SARS-CoV-2) nucleic acids may be present in the submitted sample  additional confirmatory testing may be necessary for epidemiological  and / or clinical management purposes  to differentiate between  SARS-CoV-2 and other Sarbecovirus currently known to infect humans.  If clinically indicated additional testing with an alternate test  methodology [phone]) is advised. The SARS-CoV-2 RNA is generally  detectable in upper and lower respiratory sp ecimens during the acute  phase of infection. The expected result is Negative. Fact Sheet for Patients:  StrictlyIdeas.no Fact Sheet for Healthcare Providers: BankingDealers.co.za This test is not yet approved or cleared by the [place] FDA and has been authorized for detection and/or diagnosis of SARS-CoV-2 by FDA under an Emergency Use Authorization (  EUA).  This EUA will remain in effect (meaning this test can be used) for the duration of the COVID-19 declaration under Section 564(b)(1) of the Act, 21 U.S.C. section 360bbb-3(b)(1), unless the authorization is terminated or revoked sooner. Performed at [facility], [address] [place], [place] [address]          Radiology Studies: Dg Chest [facility] 1 View  Result Date: 02/08/2019 CLINICAL DATA:  68 year old female with AFib. EXAM: PORTABLE CHEST 1 VIEW COMPARISON:  Chest radiograph dated 02/01/2019 FINDINGS: There is no focal consolidation, pleural effusion, or pneumothorax. The cardiac silhouette is within normal limits. No acute osseous pathology. IMPRESSION: No active disease. Electronically Signed   By: Anner Crete M.D.   On: 02/08/2019 01:14        Scheduled Meds: . irbesartan  150 mg Oral Daily   . metoprolol succinate  12.5 mg Oral Daily  . multivitamin with minerals  1 tablet Oral Daily  . rivaroxaban  20 mg Oral Q supper  . sodium chloride flush  3 mL Intravenous Q12H   Continuous Infusions: . sodium chloride    . diltiazem (CARDIZEM) infusion Stopped (02/08/19 0513)     LOS: 0 days     Vernell Leep, MD, FACP, [facility]. [facility]  To contact the attending provider between 7A-7P or the covering provider during after hours 7P-7A, please log into the web site [email or web address] and access using universal [place] password for that web site. If you do not have the password, please call the hospital operator.  02/08/2019, 12:14 PM

## 2019-02-10 ENCOUNTER — Telehealth: Payer: Self-pay | Admitting: Cardiovascular Disease

## 2019-02-10 DIAGNOSIS — I10 Essential (primary) hypertension: Secondary | ICD-10-CM | POA: Diagnosis not present

## 2019-02-10 DIAGNOSIS — I4891 Unspecified atrial fibrillation: Secondary | ICD-10-CM | POA: Diagnosis not present

## 2019-02-10 DIAGNOSIS — Z79899 Other long term (current) drug therapy: Secondary | ICD-10-CM | POA: Diagnosis not present

## 2019-02-10 NOTE — Telephone Encounter (Signed)

## 2019-02-11 ENCOUNTER — Telehealth: Payer: Self-pay | Admitting: *Deleted

## 2019-02-11 NOTE — Telephone Encounter (Signed)

## 2019-02-12 ENCOUNTER — Telehealth (INDEPENDENT_AMBULATORY_CARE_PROVIDER_SITE_OTHER): Payer: PPO | Admitting: Cardiology

## 2019-02-12 ENCOUNTER — Other Ambulatory Visit: Payer: Self-pay

## 2019-02-12 ENCOUNTER — Telehealth: Payer: PPO | Admitting: Student

## 2019-02-12 DIAGNOSIS — I480 Paroxysmal atrial fibrillation: Secondary | ICD-10-CM

## 2019-02-12 DIAGNOSIS — R030 Elevated blood-pressure reading, without diagnosis of hypertension: Secondary | ICD-10-CM | POA: Diagnosis not present

## 2019-02-12 MED ORDER — FLECAINIDE ACETATE 100 MG PO TABS: 100.0000 mg | ORAL_TABLET | Freq: Two times a day (BID) | ORAL | 6 refills | Status: DC

## 2019-02-12 NOTE — Progress Notes (Signed)
Virtual Visit via Telephone Note   This visit type was conducted due to national recommendations for restrictions regarding the COVID-19 Pandemic (e.g. social distancing) in an effort to limit this patient's exposure and mitigate transmission in our community.  Due to her co-morbid illnesses, this patient is at least at moderate risk for complications without adequate follow up.  This format is felt to be most appropriate for this patient at this time.  The patient did not have access to video technology/had technical difficulties with video requiring transitioning to audio format only (telephone).  All issues noted in this document were discussed and addressed.  No physical exam could be performed with this format.  Please refer to the patient's chart for her  consent to telehealth for [facility].   Date:  02/12/2019   ID:  Melissa [name], Melissa [name] [birth year], MRN [id number]  Patient Location: Home Provider Location: Home  PCP:  Asencion Noble, MD  Cardiologist:  Kate Sable, MD  Electrophysiologist:  None   Evaluation Performed:  Consultation - Allyne Gee was referred by Lilian Kapur for the evaluation of atrial fibrillation.  Chief Complaint:  palpitations  History of Present Illness:    Melissa [name] is a 68 y.o. female with a history of paroxysmal atrial fibrillation.  She had an echo with a normal ejection fraction and no LVH with biatrial enlargement which was mild.  She was seen in the ER 5 3 with sudden onset of rapid atrial fibrillation and had a cardioversion.  She was started on Xarelto at the time and discharged with metoprolol and Xarelto.  She presented back to the hospital on 5 6 with atrial fibrillation and converted after 1 dose of flecainide 300 mg.  She was discharged on 5 7 on flecainide 50 mg twice a day and Toprol-XL.  She re-presented to the emergency room 5 9 with palpitations.  She was found to be in atrial fibrillation with rapid rates and converted  on a diltiazem drip.  The patient does not have symptoms concerning for COVID-19 infection (fever, chills, cough, or new shortness of breath).    Past Medical History:  Diagnosis Date  . Atrial fibrillation ([place])   . 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 Hypertension   . Hyperthyroidism    remote  . Uterine cancer [facility])    Past Surgical History:  Procedure Laterality Date  . ABDOMINAL HYSTERECTOMY    . CHOLECYSTECTOMY    . COLONOSCOPY N/A 05/12/2014   Procedure: COLONOSCOPY;  Surgeon: Daneil Dolin, MD;  Location: [facility];  Service: Endoscopy;  Laterality: N/A;  1130  . COLONOSCOPY N/A 07/26/2017   Procedure: COLONOSCOPY;  Surgeon: Daneil Dolin, MD;  Location: [facility];  Service: Endoscopy;  Laterality: N/A;  1:00 pm  . REPLACEMENT TOTAL KNEE BILATERAL     bilateral knees replaced twice     Current Meds  Medication Sig  . acetaminophen (TYLENOL) 500 MG tablet Take 500 mg by mouth as needed for mild pain.   Marland Kitchen ALPRAZolam (XANAX) 0.25 MG tablet Take 1 tablet (0.25 mg total) by mouth 3 (three) times daily as needed for anxiety or sleep.  Marland Kitchen amLODipine (NORVASC) 5 MG tablet Take 5 mg by mouth daily.  . flecainide (TAMBOCOR) 50 MG tablet Take 2 tablets (100 mg total) by mouth 2 (two) times daily for 30 days.  . metoprolol succinate (TOPROL XL) 25 MG 24 hr tablet Take 1 tablet (25 mg total) by mouth  daily.  . Multiple Vitamins-Minerals (MULTIVITAMIN WITH MINERALS) tablet Take 1 tablet by mouth daily.  Marland Kitchen olmesartan (BENICAR) 40 MG tablet Take 40 mg by mouth daily.   . rivaroxaban (XARELTO) 20 MG TABS tablet Take 1 tablet (20 mg total) by mouth daily with supper.     Allergies:   Patient has no known allergies.   Social History   Tobacco Use  . Smoking status: Never Smoker  . Smokeless tobacco: Never Used  . Tobacco comment: Never smoked  Substance Use Topics  . Alcohol use: No    Alcohol/week: 0.0 standard drinks  . Drug use: No     Family Hx: The  patient's family history includes CVA (age of onset: 59) in her maternal grandfather. There is no history of Colon cancer.  ROS:   Please see the history of present illness.     All other systems reviewed and are negative.   Prior CV studies:   The following studies were reviewed today:  TTE 02/04/2019 1. The left ventricle has normal systolic function with an ejection fraction of 60-65%. The cavity size was normal. Left ventricular diastolic parameters were normal. No evidence of left ventricular regional wall motion abnormalities. 2. The right ventricle has normal systolic function. The cavity was normal. There is no increase in right ventricular wall thickness. Right ventricular systolic pressure normal with an estimated pressure of 28.8 mmHg. 3. Mildly thickened tricuspid valve leaflets. There is mild tricuspid regurgitation. 4. The aortic valve is tricuspid. Mild aortic annular calcification noted. 5. The mitral valve is grossly normal. Mild thickening of the mitral valve leaflet. There is mild mitral regurgitation. 6. The tricuspid valve is grossly normal. 7. The aortic root is normal in size and structure.  Labs/Other Tests and Data Reviewed:    EKG:  An ECG dated 02/07/2019 was personally reviewed today and demonstrated:  Atrial fibrillation with rapid rates  Recent Labs: 02/05/2019: ALT 23; B Natriuretic Peptide 42.0 02/07/2019: BUN 23; Creatinine, Ser 0.72; Hemoglobin 13.7; Magnesium 1.9; Platelets 166; Potassium 3.6; Sodium 129; TSH 2.635   Recent Lipid Panel Lab Results  Component Value Date/Time   CHOL 149 02/04/2019 04:32 AM   TRIG 35 02/04/2019 04:32 AM   HDL 71 02/04/2019 04:32 AM   CHOLHDL 2.1 02/04/2019 04:32 AM   LDLCALC 71 02/04/2019 04:32 AM    Wt Readings from Last 3 Encounters:  02/12/19 142 lb (64.4 kg)  02/08/19 142 lb 10.2 oz (64.7 kg)  02/05/19 143 lb 1.3 oz (64.9 kg)     Objective:    Vital Signs:  BP 139/68   Pulse (!) 58   Ht 5\' 7"  (1.702 m)    Wt 142 lb (64.4 kg)   BMI 22.24 kg/m    VITAL SIGNS:  reviewed GEN:  no acute distress PSYCH:  normal affect  ASSESSMENT & PLAN:    1. Paroxysmal atrial fibrillation: Currently on Xarelto, metoprolol, and flecainide 100 mg.  She has had multiple hospitalizations for atrial fibrillation.She is doing well on her flecainide, though due to her multiple issues, would prefer ablation.  Risks and benefits were discussed and include bleeding, tamponade, heart block, stroke, damage to surrounding organs.  She understands these risks and has agreed to the procedure.  She would be a 3B urgency.  If she continues to have more atrial fibrillation while on her flecainide, we may move up her procedure.  Ideally preprocedure, she would either have a video visit or an in person visit. This patients CHA2DS2-VASc Score  and unadjusted Ischemic Stroke Rate (% per year) is equal to 7.2 % stroke rate/year from a score of 5  Above score calculated as 1 point each if present [CHF, HTN, DM, Vascular=MI/PAD/Aortic Plaque, Age if 65-74, or Female] Above score calculated as 2 points each if present [Age > 75, or Stroke/TIA/TE]    2. CVA: Occurred in 2015 without neuro deficits.  Okay for anticoagulation per neurology. 3. Hypertension: Currently on metoprolol and irbesartan.  COVID-19 Education: The signs and symptoms of COVID-19 were discussed with the patient and how to seek care for testing (follow up with PCP or arrange E-visit).  The importance of social distancing was discussed today.  Time:   Today, I have spent 15 minutes with the patient with telehealth technology discussing the above problems.     Medication Adjustments/Labs and Tests Ordered: Current medicines are reviewed at length with the patient today.  Concerns regarding medicines are outlined above.   Tests Ordered: No orders of the defined types were placed in this encounter.   Medication Changes: No orders of the defined types were placed  in this encounter.   Disposition:  Follow up in 3 month(s)  Signed, Horton Ellithorpe Meredith Leeds, MD  02/12/2019 11:18 AM    [place]

## 2019-02-12 NOTE — Addendum Note (Signed)
Addended by: Stanton Kidney on: 02/12/2019 11:56 AM   Modules accepted: Orders

## 2019-02-18 ENCOUNTER — Telehealth: Payer: Self-pay | Admitting: Cardiology

## 2019-02-18 NOTE — Telephone Encounter (Signed)
I spoke to Bradd Canary from [employer] in regards to patient.  She was wondering what the term "3B Urgency" means in notes from last OV and when the patient's ablation plans to be done.  She would like a call in the morning, if possible.  She has a meeting at 2 and would like to know before.  I told her that I would forward.  She was thankful for the call and verbalized understanding.

## 2019-02-18 NOTE — Telephone Encounter (Signed)
New message    Kayleen Memos wants to know what 3B Urgency means per the patient notes and if the ablation is going to be scheduled soon? Please call.

## 2019-02-19 NOTE — Telephone Encounter (Signed)
Explanation given to [facility]. She appreciates the follow up

## 2019-02-24 ENCOUNTER — Encounter: Payer: Self-pay | Admitting: *Deleted

## 2019-02-24 ENCOUNTER — Other Ambulatory Visit: Payer: Self-pay | Admitting: *Deleted

## 2019-02-24 NOTE — Patient Outreach (Signed)
[facility] High Risk Patient. Has had several ED visits for heart arrhythmias and multiple interventions: medical management, cardioversion, medical management and is now awaiting an ablation.  She advised me today that she has not had any further palpitations since she was started on the flecanide. She is feeling good about that. She reports their for 40 patient's in front of her waiting on the procedure.   I talked to her about [place] [facility] services and she would like to have our information for future reference.  I've encouraged her to call me with any problems.  Eulah Pont. Myrtie Neither, MSN, [facility] Gerontological Nurse Practitioner [facility] Care Management [phone]

## 2019-02-27 ENCOUNTER — Ambulatory Visit: Payer: PPO | Admitting: Student

## 2019-03-13 ENCOUNTER — Telehealth: Payer: Self-pay | Admitting: Cardiology

## 2019-03-13 NOTE — Telephone Encounter (Signed)
Patient called stating her pulse has been running low in the 40's, she currently taking metoprolol succinate 6mg  twice a day and flecainide (TAMBOCOR) 100 MG.   Right now her pulse is 55.  She wants to know if she should stop taking it.

## 2019-03-13 NOTE — Telephone Encounter (Signed)
Pt reports low HRs. Pt is taking a 1/4 of her Toprol pill, she started with splitting it and then down to quartering it d/t low HRs.  Pt is going to hold the 1/4 tablet Toprol she has been taking. She will continue Flecainide. Pt scheduled for in-office visit Tuesday to further discuss w/ Camnitz. Reports no symptoms/issues w/ low HRs.  Denies light-headedness/dizziness/synocpe/SOB. Patient verbalized understanding and agreeable to plan.

## 2019-03-17 ENCOUNTER — Ambulatory Visit: Payer: PPO | Admitting: Cardiology

## 2019-03-25 DIAGNOSIS — I10 Essential (primary) hypertension: Secondary | ICD-10-CM | POA: Diagnosis not present

## 2019-03-25 DIAGNOSIS — I480 Paroxysmal atrial fibrillation: Secondary | ICD-10-CM | POA: Diagnosis not present

## 2019-03-25 DIAGNOSIS — F419 Anxiety disorder, unspecified: Secondary | ICD-10-CM | POA: Diagnosis not present

## 2019-03-30 ENCOUNTER — Telehealth: Payer: Self-pay | Admitting: Cardiology

## 2019-03-30 NOTE — Telephone Encounter (Signed)
  Melissa [name] wants to speak to the nurse regarding some questions about having an ablation

## 2019-03-30 NOTE — Telephone Encounter (Signed)
Pt scheduled for in-office visit 7/17 to discuss ablation. 8/6 spot help for procedure Patient verbalized understanding and agreeable to plan.

## 2019-03-31 ENCOUNTER — Other Ambulatory Visit: Payer: Self-pay | Admitting: *Deleted

## 2019-03-31 NOTE — Patient Outreach (Signed)
Brief call to Mrs. Sylvain to verify if she had ever been a pt at [facility]. Patient Melissa [name] is showing that she is a pt there. Mrs. Rueckert denies ever being a pt there. I will relay this information to our associate, Patient Isla Pence C. Myrtie Neither, MSN, [facility] Gerontological Nurse Practitioner [facility] Care Management [phone]

## 2019-04-15 ENCOUNTER — Telehealth: Payer: Self-pay | Admitting: Cardiology

## 2019-04-15 NOTE — Telephone Encounter (Signed)

## 2019-04-17 ENCOUNTER — Ambulatory Visit (INDEPENDENT_AMBULATORY_CARE_PROVIDER_SITE_OTHER): Payer: PPO | Admitting: Cardiology

## 2019-04-17 ENCOUNTER — Telehealth: Payer: Self-pay | Admitting: Cardiology

## 2019-04-17 ENCOUNTER — Encounter: Payer: Self-pay | Admitting: Cardiology

## 2019-04-17 ENCOUNTER — Other Ambulatory Visit: Payer: Self-pay

## 2019-04-17 VITALS — BP 154/72 | HR 77 | Ht 67.0 in | Wt 146.0 lb

## 2019-04-17 DIAGNOSIS — I480 Paroxysmal atrial fibrillation: Secondary | ICD-10-CM

## 2019-04-17 DIAGNOSIS — Z01812 Encounter for preprocedural laboratory examination: Secondary | ICD-10-CM | POA: Diagnosis not present

## 2019-04-17 NOTE — Progress Notes (Signed)
Electrophysiology Office Note   Date:  04/17/2019   ID:  Melissa, [name] 1951-04-11, MRN [id number]  PCP:  Asencion Noble, MD  Cardiologist:  Bronson Ing Primary Electrophysiologist:  Constance Haw, MD    No chief complaint on file.    History of Present Illness: Melissa [name] is a 68 y.o. female who is being seen today for the evaluation of atrial fibrillation at the request of Asencion Noble, MD. Presenting today for electrophysiology evaluation.  She has a history of atrial fibrillation, CVA, hypertension.  She has had multiple episodes of rapid atrial fibrillation.  She is currently on flecainide.  At this point, she would like to come off of her flecainide as she is unsure of the efficacy of the medication.  She is interested in ablation today.  Today, she denies symptoms of palpitations, chest pain, shortness of breath, orthopnea, PND, lower extremity edema, claudication, dizziness, presyncope, syncope, bleeding, or neurologic sequela. The patient is tolerating medications without difficulties.    Past Medical History:  Diagnosis Date  . Atrial fibrillation ([place])   . 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 Hypertension   . Hyperthyroidism    remote  . Uterine cancer [facility])    Past Surgical History:  Procedure Laterality Date  . ABDOMINAL HYSTERECTOMY    . CHOLECYSTECTOMY    . COLONOSCOPY N/A 05/12/2014   Procedure: COLONOSCOPY;  Surgeon: Daneil Dolin, MD;  [facility];  Service: Endoscopy;  Laterality: N/A;  1130  . COLONOSCOPY N/A 07/26/2017   Procedure: COLONOSCOPY;  Surgeon: Daneil Dolin, MD;  Location: [facility];  Service: Endoscopy;  Laterality: N/A;  1:00 pm  . REPLACEMENT TOTAL KNEE BILATERAL     bilateral knees replaced twice     Current Outpatient Medications  Medication Sig Dispense Refill  . acetaminophen (TYLENOL) 500 MG tablet Take 500 mg by mouth as needed for mild pain.     Marland Kitchen ALPRAZolam (XANAX) 0.25 MG tablet Take  1 tablet (0.25 mg total) by mouth 3 (three) times daily as needed for anxiety or sleep. 10 tablet 0  . amLODipine (NORVASC) 5 MG tablet Take 7.5 mg by mouth daily.     Marland Kitchen escitalopram (LEXAPRO) 5 MG tablet Take 5 mg by mouth daily.    . flecainide (TAMBOCOR) 100 MG tablet Take 1 tablet (100 mg total) by mouth 2 (two) times daily. 60 tablet 6  . metoprolol succinate (TOPROL XL) 25 MG 24 hr tablet Take 1 tablet (25 mg total) by mouth daily. (Patient taking differently: Take by mouth daily. PATIENT STATES TAKES 1/4 TABLET DAILY) 30 tablet 0  . Multiple Vitamins-Minerals (MULTIVITAMIN WITH MINERALS) tablet Take 1 tablet by mouth daily.    Marland Kitchen olmesartan (BENICAR) 40 MG tablet Take 40 mg by mouth daily.     . rivaroxaban (XARELTO) 20 MG TABS tablet Take 1 tablet (20 mg total) by mouth daily with supper. 30 tablet 0   No current facility-administered medications for this visit.     Allergies:   Patient has no known allergies.   Social History:  The patient  reports that she has never smoked. She has never used smokeless tobacco. She reports that she does not drink alcohol or use drugs.   Family History:  The patient's family history includes CVA (age of onset: 73) in her maternal grandfather.    ROS:  Please see the history of present illness.   Otherwise, review of systems is positive for none.  All other systems are reviewed and negative.    PHYSICAL EXAM: VS:  BP (!) 154/72   Pulse 77   Ht 5\' 7"  (1.702 m)   Wt 146 lb (66.2 kg)   BMI 22.87 kg/m  , BMI Body mass index is 22.87 kg/m. GEN: Well nourished, well developed, in no acute distress  HEENT: normal  Neck: no JVD, carotid bruits, or masses Cardiac: RRR; no murmurs, rubs, or gallops,no edema  Respiratory:  clear to auscultation bilaterally, normal work of breathing GI: soft, nontender, nondistended, + BS MS: no deformity or atrophy  Skin: warm and dry Neuro:  Strength and sensation are intact Psych: euthymic mood, full affect   EKG:  EKG is ordered today. Personal review of the ekg ordered shows sinus rhythm, rate 77  Recent Labs: 02/05/2019: ALT 23; B Natriuretic Peptide 42.0 02/07/2019: BUN 23; Creatinine, Ser 0.72; Hemoglobin 13.7; Magnesium 1.9; Platelets 166; Potassium 3.6; Sodium 129; TSH 2.635    Lipid Panel     Component Value Date/Time   CHOL 149 02/04/2019 0432   TRIG 35 02/04/2019 0432   HDL 71 02/04/2019 0432   CHOLHDL 2.1 02/04/2019 0432   VLDL 7 02/04/2019 0432   LDLCALC 71 02/04/2019 0432     Wt Readings from Last 3 Encounters:  04/17/19 146 lb (66.2 kg)  02/12/19 142 lb (64.4 kg)  02/08/19 142 lb 10.2 oz (64.7 kg)      Other studies Reviewed: Additional studies/ records that were reviewed today include: TTE 02/04/2019 Review of the above records today demonstrates:  1. The left ventricle has normal systolic function with an ejection fraction of 60-65%. The cavity size was normal. Left ventricular diastolic parameters were normal. No evidence of left ventricular regional wall motion abnormalities. 2. The right ventricle has normal systolic function. The cavity was normal. There is no increase in right ventricular wall thickness. Right ventricular systolic pressure normal with an estimated pressure of 28.8 mmHg. 3. Mildly thickened tricuspid valve leaflets. There is mild tricuspid regurgitation. 4. The aortic valve is tricuspid. Mild aortic annular calcification noted. 5. The mitral valve is grossly normal. Mild thickening of the mitral valve leaflet. There is mild mitral regurgitation. 6. The tricuspid valve is grossly normal. 7. The aortic root is normal in size and structure.    ASSESSMENT AND PLAN:  1.  Paroxysmal atrial fibrillation: Currently on Xarelto, metoprolol, flecainide.  At this point she would like to stop her flecainide.  I discussed with her further options including ablation.  Risks and benefits were discussed include bleeding, tamponade, infection, heart block,  stroke, damage surrounding organs.  She understands these risks and is agreed to the procedure.  Chads vasc5  2.  CVA: Occurred in 2015 without residual defects.  3.  Hypertension: Blood pressure is elevated today but she feels that this is due to whitecoat hypertension.  No changes.    Current medicines are reviewed at length with the patient today.   The patient does not have concerns regarding her medicines.  The following changes were made today:  none  Labs/ tests ordered today include:  Orders Placed This Encounter  Procedures  . CT CARDIAC MORPH/PULM VEIN W/CM&W/O CA SCORE  . CT CORONARY FRACTIONAL FLOW RESERVE DATA PREP  . CT CORONARY FRACTIONAL FLOW RESERVE FLUID ANALYSIS  . Basic metabolic panel  . CBC  . EKG 12-Lead     Disposition:   FU with Melissa [name] 3 months  Signed, Reka Wist [place], MD  04/17/2019 1:57 PM  [place] [address] [phone] (office) [phone] (fax)

## 2019-04-17 NOTE — Telephone Encounter (Signed)
Contacted patient via phone call  so she can get her BMP done  close to where she lives. Order enrty error . I got the answering service. Waiting for return call . KB

## 2019-04-17 NOTE — Patient Instructions (Addendum)
Medication Instructions:  Your physician recommends that you continue on your current medications as directed. Please refer to the Current Medication list given to you today.  *If you need a refill on your cardiac medications before your next appointment, please call your pharmacy.  Labwork: You will get lab work today:  [place] notify you of abnormal results, otherwise continue current treatment plan.  Testing/Procedures: Your physician has requested that you have cardiac CT within 7 days prior to your ablation. Cardiac computed tomography (CT) is a painless test that uses an x-ray machine to take clear, detailed pictures of your heart. For further information please visit [facility]. Please follow instruction below located under special instructions. You will get a call from our office to schedule the date for this test.  Your physician has recommended that you have an ablation. Catheter ablation is a medical procedure used to treat some cardiac arrhythmias (irregular heartbeats). During catheter ablation, a long, thin, flexible tube is put into a blood vessel in your groin (upper thigh), or neck. This tube is called an ablation catheter. It is then guided to your heart through the blood vessel. Radio frequency waves destroy small areas of heart tissue where abnormal heartbeats may cause an arrhythmia to start. Please see the instructions below located under special instructions  Follow-Up: Your physician recommends that you schedule a follow-up appointment in: 4 weeks, after your procedure on 05/08/19, with Roderic Palau NP in the AFib clinic.  Your physician recommends that you schedule a follow up appointment in: 3 months, after your procedure on 05/08/19, with Dr. Curt Bears.  *Please note that any paperwork needing to be filled out by the provider will need to be addressed at the front desk prior to seeing the provider. Please note that any FMLA, disability or other documents  regarding health condition is subject to a $25.00 charge that must be received prior to completion of paperwork in the form of a money order or check.  Thank you for choosing [facility]!! Trinidad Curet, RN [phone]   Any Other Special Instructions Will Be Listed Below      CARDIAC CT INSTRUCTIONS:  Please arrive at the [facility] main entrance of [facility] at ________  AM (30-45 minutes prior to test start time) [facility] [address] [place], [place] [phone]  Proceed to the [facility] Radiology Department (First Floor).  Please follow these instructions carefully (unless otherwise directed):  On the Night Before the Test: . Drink plenty of water. . Do not consume any caffeinated/decaffeinated beverages or chocolate 12 hours prior to your test. . Do not take any antihistamines 12 hours prior to your test.  On the Day of the Test: . Drink plenty of water. Do not drink any water within one hour of the test. . Do not eat any food 4 hours prior to the test. . You may take your regular medications prior to the test. . Take 50 mg of Toprol two hours  before the test.   . HOLD Furosemide morning of the test.  After the Test: . Drink plenty of water. . After receiving IV contrast, you may experience a mild flushed feeling. This is normal. . On occasion, you may experience a mild rash up to 24 hours after the test. This is not dangerous. If this occurs, you can take Benadryl 25 mg and increase your fluid intake. . If you experience trouble breathing, this can be serious. If it  is severe call 911 IMMEDIATELY. If it is mild, please call our office.   COVID SCREENING You are scheduled for COVID screening on 05/05/19 @ 9:45 am at [facility] drive through location.   [address] across from the ED   Instructions for your ablation: 1. Please arrive at the [facility], Main Entrance "A", of [facility] at 8:30 a.m.  on 05/08/19. 2. Do not eat or drink after midnight the night prior to the procedure. 3. Do not miss any doses of XARELTO prior to the morning of the procedure.  4. Do not take any medications the morning of the procedure. 5. Both of your groins will need to be shaved for this procedure (if needed). We ask that you do this yourself at home 1-2 days prior to the procedure.  If you are unable/uncomfortable to do yourself, the hospital staff will shave you the day of your procedure (if needed). 6. You will need someone to drive you home at discharge.

## 2019-04-18 LAB — CBC
Hematocrit: 37.3 % (ref 34.0–46.6)
Hemoglobin: 13.1 g/dL (ref 11.1–15.9)
MCH: 33 pg (ref 26.6–33.0)
MCHC: 35.1 g/dL (ref 31.5–35.7)
MCV: 94 fL (ref 79–97)
Platelets: 173 10*3/uL (ref 150–450)
RBC: 3.97 x10E6/uL (ref 3.77–5.28)
RDW: 11.8 % (ref 11.7–15.4)
WBC: 6.6 10*3/uL (ref 3.4–10.8)

## 2019-04-20 ENCOUNTER — Other Ambulatory Visit (HOSPITAL_COMMUNITY)
Admission: RE | Admit: 2019-04-20 | Discharge: 2019-04-20 | Disposition: A | Discharge status: Home / Self Care | Payer: PPO | Source: Ambulatory Visit | Attending: Cardiology | Admitting: Cardiology

## 2019-04-20 ENCOUNTER — Other Ambulatory Visit: Payer: Self-pay

## 2019-04-20 DIAGNOSIS — I480 Paroxysmal atrial fibrillation: Secondary | ICD-10-CM | POA: Diagnosis not present

## 2019-04-20 DIAGNOSIS — Z01812 Encounter for preprocedural laboratory examination: Secondary | ICD-10-CM | POA: Insufficient documentation

## 2019-04-20 LAB — BASIC METABOLIC PANEL
Anion gap: 9 (ref 5–15)
BUN: 10 mg/dL (ref 8–23)
CO2: 28 mmol/L (ref 22–32)
Calcium: 9.3 mg/dL (ref 8.9–10.3)
Chloride: 93 mmol/L — ABNORMAL LOW (ref 98–111)
Creatinine, Ser: 0.69 mg/dL (ref 0.44–1.00)
GFR calc Af Amer: >60 mL/min (ref >60)
GFR calc non Af Amer: >60 mL/min (ref >60)
Glucose, Bld: 89 mg/dL (ref 70–99)
Potassium: 3.8 mmol/L (ref 3.5–5.1)
Sodium: 130 mmol/L — ABNORMAL LOW (ref 135–145)

## 2019-04-23 DIAGNOSIS — I480 Paroxysmal atrial fibrillation: Secondary | ICD-10-CM | POA: Diagnosis not present

## 2019-04-23 DIAGNOSIS — R55 Syncope and collapse: Secondary | ICD-10-CM | POA: Diagnosis not present

## 2019-04-23 DIAGNOSIS — F419 Anxiety disorder, unspecified: Secondary | ICD-10-CM | POA: Diagnosis not present

## 2019-04-28 ENCOUNTER — Telehealth: Payer: Self-pay | Admitting: Cardiology

## 2019-04-28 NOTE — Telephone Encounter (Signed)
°  Patient called and wanted to ask Sherri some  Questions regarding her upcoming Cardiac CT and Abation.  It is not urgent, and can wait until tomorrow if necessary.

## 2019-04-29 NOTE — Telephone Encounter (Signed)
Pt inquiring as to taking her Metoprolol for the CT next week. States that she only takes a quarter tablet b/c her HRs are too low if she takes more and she feels tired/weak on higher doses.  Pt advised to take her normal dose d/t low HRs.  Pt understands that we want her HR low for CT images.  Pt will check her HR morning of CT and if elevated she will take a 1/2 tablet.  States that normally HRs are 50-60s at home. She also asked if she needed to shave her groins.  Advised to shave if needed, or hospital will do if needed if pt is uncomfortable/unable to do herself. Pt asked if husband can come with her.  Informed that he may come with her but he must have a mask and will need to wait in the waiting area. Patient verbalized understanding and agreeable to plan.

## 2019-05-03 ENCOUNTER — Telehealth (HOSPITAL_COMMUNITY): Payer: Self-pay | Admitting: Emergency Medicine

## 2019-05-03 NOTE — Telephone Encounter (Signed)
Left message on voicemail with name and callback number Marchia Bond RN Navigator Cardiac [facility] [phone] Office [phone] Cell  mychart message sent

## 2019-05-04 ENCOUNTER — Other Ambulatory Visit: Payer: Self-pay

## 2019-05-04 ENCOUNTER — Encounter (HOSPITAL_COMMUNITY): Payer: Self-pay

## 2019-05-04 ENCOUNTER — Ambulatory Visit (HOSPITAL_COMMUNITY): Payer: PPO

## 2019-05-04 ENCOUNTER — Ambulatory Visit (HOSPITAL_COMMUNITY)
Admission: RE | Admit: 2019-05-04 | Discharge: 2019-05-04 | Disposition: A | Discharge status: Home / Self Care | Payer: PPO | Source: Ambulatory Visit | Attending: Cardiology | Admitting: Cardiology

## 2019-05-04 DIAGNOSIS — I480 Paroxysmal atrial fibrillation: Secondary | ICD-10-CM | POA: Diagnosis not present

## 2019-05-04 MED ORDER — NITROGLYCERIN 0.4 MG SL SUBL
0.8000 mg | SUBLINGUAL_TABLET | SUBLINGUAL | Status: DC | PRN
  Administered 2019-05-04: 0.8 mg via SUBLINGUAL
  Filled 2019-05-04 (×2): qty 25

## 2019-05-04 MED ORDER — NITROGLYCERIN 0.4 MG SL SUBL
SUBLINGUAL_TABLET | SUBLINGUAL | Status: AC
  Filled 2019-05-04: qty 2

## 2019-05-04 MED ORDER — IOHEXOL 350 MG/ML SOLN
80.0000 mL | Freq: Once | INTRAVENOUS | Status: AC | PRN
  Administered 2019-05-04: 09:00:00 80 mL via INTRAVENOUS

## 2019-05-04 NOTE — Progress Notes (Signed)
Pt has been made aware of normal result and verbalized understanding.  Jw8/11/2018

## 2019-05-05 ENCOUNTER — Other Ambulatory Visit (HOSPITAL_COMMUNITY)
Admission: RE | Admit: 2019-05-05 | Discharge: 2019-05-05 | Disposition: A | Discharge status: Home / Self Care | Payer: PPO | Source: Ambulatory Visit | Attending: Cardiology | Admitting: Cardiology

## 2019-05-05 ENCOUNTER — Inpatient Hospital Stay (HOSPITAL_COMMUNITY): Admission: RE | Admit: 2019-05-05 | Payer: PPO | Source: Ambulatory Visit

## 2019-05-05 ENCOUNTER — Telehealth: Payer: Self-pay | Admitting: Cardiology

## 2019-05-05 DIAGNOSIS — Z01812 Encounter for preprocedural laboratory examination: Secondary | ICD-10-CM | POA: Diagnosis not present

## 2019-05-05 DIAGNOSIS — Z20828 Contact with and (suspected) exposure to other viral communicable diseases: Secondary | ICD-10-CM | POA: Diagnosis not present

## 2019-05-05 LAB — SARS CORONAVIRUS 2 (TAT 6-24 HRS): SARS Coronavirus 2: NEGATIVE

## 2019-05-05 NOTE — Telephone Encounter (Signed)
Pt asking what medication/s she should take the morning of her procedure.  Informed no medications morning of, she asks if she can take her BP medications and instructed that she may if needed. She thanks me for the return call.

## 2019-05-05 NOTE — Telephone Encounter (Signed)
Patient has a few questions about her upcoming surgery.

## 2019-05-08 ENCOUNTER — Other Ambulatory Visit: Payer: Self-pay

## 2019-05-08 ENCOUNTER — Ambulatory Visit (HOSPITAL_COMMUNITY): Payer: PPO | Admitting: Anesthesiology

## 2019-05-08 ENCOUNTER — Ambulatory Visit (HOSPITAL_COMMUNITY)
Admission: RE | Admit: 2019-05-08 | Discharge: 2019-05-08 | Disposition: A | Discharge status: Home / Self Care | Payer: PPO | Attending: Cardiology | Admitting: Cardiology

## 2019-05-08 ENCOUNTER — Encounter (HOSPITAL_COMMUNITY): Payer: Self-pay | Admitting: Anesthesiology

## 2019-05-08 ENCOUNTER — Encounter (HOSPITAL_COMMUNITY)
Admission: RE | Disposition: A | Discharge status: Home / Self Care | Payer: PPO | Source: Home / Self Care | Attending: Cardiology

## 2019-05-08 DIAGNOSIS — Z79899 Other long term (current) drug therapy: Secondary | ICD-10-CM | POA: Insufficient documentation

## 2019-05-08 DIAGNOSIS — Z7901 Long term (current) use of anticoagulants: Secondary | ICD-10-CM | POA: Diagnosis not present

## 2019-05-08 DIAGNOSIS — Z8673 Personal history of transient ischemic attack (TIA), and cerebral infarction without residual deficits: Secondary | ICD-10-CM | POA: Insufficient documentation

## 2019-05-08 DIAGNOSIS — I639 Cerebral infarction, unspecified: Secondary | ICD-10-CM | POA: Diagnosis not present

## 2019-05-08 DIAGNOSIS — I10 Essential (primary) hypertension: Secondary | ICD-10-CM | POA: Insufficient documentation

## 2019-05-08 DIAGNOSIS — I480 Paroxysmal atrial fibrillation: Secondary | ICD-10-CM | POA: Diagnosis present

## 2019-05-08 DIAGNOSIS — I4891 Unspecified atrial fibrillation: Secondary | ICD-10-CM | POA: Diagnosis not present

## 2019-05-08 DIAGNOSIS — E871 Hypo-osmolality and hyponatremia: Secondary | ICD-10-CM | POA: Diagnosis not present

## 2019-05-08 HISTORY — PX: ATRIAL FIBRILLATION ABLATION: EP1191

## 2019-05-08 LAB — POCT ACTIVATED CLOTTING TIME
Activated Clotting Time: 345 seconds
Activated Clotting Time: 400 seconds
Activated Clotting Time: 323 seconds
Activated Clotting Time: 142 seconds

## 2019-05-08 SURGERY — ATRIAL FIBRILLATION ABLATION
Anesthesia: General

## 2019-05-08 MED ORDER — HEPARIN SODIUM (PORCINE) 1000 UNIT/ML IJ SOLN
INTRAMUSCULAR | Status: DC | PRN
  Administered 2019-05-08: 13000 [IU] via INTRAVENOUS
  Administered 2019-05-08: 1000 [IU] via INTRAVENOUS

## 2019-05-08 MED ORDER — HEPARIN (PORCINE) IN NACL 1000-0.9 UT/500ML-% IV SOLN
INTRAVENOUS | Status: DC | PRN
  Administered 2019-05-08 (×5): 500 mL

## 2019-05-08 MED ORDER — LIDOCAINE 2% (20 MG/ML) 5 ML SYRINGE
INTRAMUSCULAR | Status: DC | PRN
  Administered 2019-05-08: 80 mg via INTRAVENOUS

## 2019-05-08 MED ORDER — DOBUTAMINE IN D5W 4-5 MG/ML-% IV SOLN
INTRAVENOUS | Status: DC | PRN
  Administered 2019-05-08: 20 ug/kg/min via INTRAVENOUS

## 2019-05-08 MED ORDER — ROCURONIUM BROMIDE 10 MG/ML (PF) SYRINGE
PREFILLED_SYRINGE | INTRAVENOUS | Status: DC | PRN
  Administered 2019-05-08: 50 mg via INTRAVENOUS

## 2019-05-08 MED ORDER — PROTAMINE SULFATE 10 MG/ML IV SOLN
INTRAVENOUS | Status: DC | PRN
  Administered 2019-05-08: 50 mg via INTRAVENOUS

## 2019-05-08 MED ORDER — HYDRALAZINE HCL 20 MG/ML IJ SOLN
10.0000 mg | Freq: Once | INTRAMUSCULAR | Status: AC
  Administered 2019-05-08: 10 mg via INTRAVENOUS

## 2019-05-08 MED ORDER — MIDAZOLAM HCL 5 MG/5ML IJ SOLN
INTRAMUSCULAR | Status: DC | PRN
  Administered 2019-05-08: 2 mg via INTRAVENOUS

## 2019-05-08 MED ORDER — HYDRALAZINE HCL 20 MG/ML IJ SOLN
INTRAMUSCULAR | Status: AC
  Filled 2019-05-08: qty 1

## 2019-05-08 MED ORDER — FENTANYL CITRATE (PF) 100 MCG/2ML IJ SOLN
INTRAMUSCULAR | Status: DC | PRN
  Administered 2019-05-08: 100 ug via INTRAVENOUS

## 2019-05-08 MED ORDER — HEPARIN SODIUM (PORCINE) 1000 UNIT/ML IJ SOLN
INTRAMUSCULAR | Status: DC | PRN
  Administered 2019-05-08: 1000 [IU] via INTRAVENOUS

## 2019-05-08 MED ORDER — SODIUM CHLORIDE 0.9 % IV SOLN: INTRAVENOUS | Status: DC

## 2019-05-08 MED ORDER — SODIUM CHLORIDE 0.9 % IV SOLN
INTRAVENOUS | Status: DC | PRN
  Administered 2019-05-08: 11:00:00 40 ug/min via INTRAVENOUS

## 2019-05-08 MED ORDER — HEPARIN (PORCINE) IN NACL 1000-0.9 UT/500ML-% IV SOLN
INTRAVENOUS | Status: AC
  Filled 2019-05-08: qty 500

## 2019-05-08 MED ORDER — PROPOFOL 10 MG/ML IV BOLUS
INTRAVENOUS | Status: DC | PRN
  Administered 2019-05-08: 120 mg via INTRAVENOUS

## 2019-05-08 MED ORDER — DOBUTAMINE IN D5W 4-5 MG/ML-% IV SOLN
INTRAVENOUS | Status: AC
  Filled 2019-05-08: qty 250

## 2019-05-08 MED ORDER — BUPIVACAINE HCL (PF) 0.25 % IJ SOLN
INTRAMUSCULAR | Status: DC | PRN
  Administered 2019-05-08 (×2): 20 mL

## 2019-05-08 MED ORDER — BUPIVACAINE HCL (PF) 0.25 % IJ SOLN
INTRAMUSCULAR | Status: AC
  Filled 2019-05-08: qty 60

## 2019-05-08 MED ORDER — ONDANSETRON HCL 4 MG/2ML IJ SOLN
INTRAMUSCULAR | Status: DC | PRN
  Administered 2019-05-08: 4 mg via INTRAVENOUS

## 2019-05-08 MED ORDER — HEPARIN (PORCINE) IN NACL 1000-0.9 UT/500ML-% IV SOLN
INTRAVENOUS | Status: AC
  Filled 2019-05-08: qty 2500

## 2019-05-08 MED ORDER — HEPARIN SODIUM (PORCINE) 1000 UNIT/ML IJ SOLN
INTRAMUSCULAR | Status: AC
  Filled 2019-05-08: qty 1

## 2019-05-08 MED ORDER — EPHEDRINE SULFATE-NACL 50-0.9 MG/10ML-% IV SOSY
PREFILLED_SYRINGE | INTRAVENOUS | Status: DC | PRN
  Administered 2019-05-08: 10 mg via INTRAVENOUS

## 2019-05-08 MED ORDER — DEXAMETHASONE SODIUM PHOSPHATE 10 MG/ML IJ SOLN
INTRAMUSCULAR | Status: DC | PRN
  Administered 2019-05-08: 5 mg via INTRAVENOUS

## 2019-05-08 MED ORDER — SUGAMMADEX SODIUM 200 MG/2ML IV SOLN
INTRAVENOUS | Status: DC | PRN
  Administered 2019-05-08: 200 mg via INTRAVENOUS

## 2019-05-08 SURGICAL SUPPLY — 20 items
BLANKET WARM UNDERBOD FULL ACC (MISCELLANEOUS) ×3 IMPLANT
CATH MAPPNG PENTARAY F 2-6-2MM (CATHETERS) IMPLANT
CATH SMTCH THERMOCOOL SF DF (CATHETERS) ×2 IMPLANT
CATH SOUNDSTAR ECO REPROCESSED (CATHETERS) ×2 IMPLANT
CATH WEBSTER BI DIR CS D-F CRV (CATHETERS) ×2 IMPLANT
COVER SWIFTLINK CONNECTOR (BAG) ×3 IMPLANT
PACK EP LATEX FREE (CUSTOM PROCEDURE TRAY) ×2
PACK EP LF (CUSTOM PROCEDURE TRAY) ×1 IMPLANT
PAD PRO RADIOLUCENT 2001M-C (PAD) ×3 IMPLANT
PATCH CARTO3 (PAD) ×2 IMPLANT
PENTARAY F 2-6-2MM (CATHETERS) ×3
SHEATH AGILIS NXT 8.5F 71CM (SHEATH) ×2 IMPLANT
SHEATH AVANTI 11F 11CM (SHEATH) ×2 IMPLANT
SHEATH BAYLIS SUREFLEX  M 8.5 (SHEATH) ×2
SHEATH BAYLIS SUREFLEX M 8.5 (SHEATH) IMPLANT
SHEATH BAYLIS TRANSSEPTAL 98CM (NEEDLE) ×2 IMPLANT
SHEATH PINNACLE 7F 10CM (SHEATH) ×2 IMPLANT
SHEATH PINNACLE 8F 10CM (SHEATH) ×4 IMPLANT
SHEATH PINNACLE 9F 10CM (SHEATH) ×4 IMPLANT
TUBING SMART ABLATE COOLFLOW (TUBING) ×2 IMPLANT

## 2019-05-08 NOTE — Progress Notes (Signed)
No bleeding or hematoma noted after ambulation 

## 2019-05-08 NOTE — Progress Notes (Signed)
    2 X 9 Fr R F/V and 1 X 11 Fr . 1 X 7 Fr sheaths were pulled manually and pressure was held for min. on each side.Sterile gauze was applied at both sites. Both groins are soft and non tender.   R and L DP  were palpable before and after the sheath pull.   Bed rest started at 1445  X  6 hr. Instructions were given to patient about bed rest time.    HR 67 SR  BP 131/49  sPO2 100 % on R/A

## 2019-05-08 NOTE — Anesthesia Postprocedure Evaluation (Signed)
Anesthesia Post Note  Patient: Melissa [name]  Procedure(s) Performed: ATRIAL FIBRILLATION ABLATION (N/A )     Patient location during evaluation: PACU Anesthesia Type: General Level of consciousness: awake and alert and oriented Pain management: pain level controlled Vital Signs Assessment: post-procedure vital signs reviewed and stable Respiratory status: spontaneous breathing, nonlabored ventilation and respiratory function stable Cardiovascular status: blood pressure returned to baseline and stable Postop Assessment: no apparent nausea or vomiting Anesthetic complications: no    Last Vitals:  Vitals:   05/08/19 1345 05/08/19 1358  BP: (!) 141/54 (!) 130/58  Pulse: 67 70  Resp: 11 12  Temp:  36.5 C  SpO2: 99%     Last Pain:  Vitals:   05/08/19 1358  TempSrc: Temporal  PainSc: 0-No pain                 Ronnita Paz A.

## 2019-05-08 NOTE — Anesthesia Preprocedure Evaluation (Addendum)
Anesthesia Evaluation  Patient identified by MRN, date of birth, ID band Patient awake    Reviewed: Allergy & Precautions, NPO status , Patient's Chart, lab work & pertinent test results, reviewed documented beta blocker date and time   Airway Mallampati: III  TM Distance: >3 FB Neck ROM: Full    Dental no notable dental hx. (+) Teeth Intact, Caps   Pulmonary neg pulmonary ROS,    Pulmonary exam normal breath sounds clear to auscultation       Cardiovascular hypertension, Pt. on medications and Pt. on home beta blockers  Rhythm:Regular Rate:Normal  Echo 02/04/2019 1. The left ventricle has normal systolic function with an ejection fraction of 60-65%. The cavity size was normal. Left ventricular diastolic parameters were normal. No evidence of left ventricular regional wall motion abnormalities.  2. The right ventricle has normal systolic function. The cavity was normal. There is no increase in right ventricular wall thickness. Right ventricular systolic pressure normal with an estimated pressure of 28.8 mmHg.  3. Mildly thickened tricuspid valve leaflets. There is mild tricuspid regurgitation.  4. The aortic valve is tricuspid. Mild aortic annular calcification noted.  5. The mitral valve is grossly normal. Mild thickening of the mitral valve leaflet. There is mild mitral regurgitation.  6. The tricuspid valve is grossly normal.  7. The aortic root is normal in size and structure  EKG 04/2019 NSR, 1st degree AV Block   Neuro/Psych CVA, No Residual Symptoms negative psych ROS   GI/Hepatic negative GI ROS, Neg liver ROS,   Endo/Other  Hyperthyroidism   Renal/GU negative Renal ROS  negative genitourinary   Musculoskeletal  (+) Arthritis , Osteoarthritis,    Abdominal   Peds  Hematology Xarelto therapy- last dose last pm   Anesthesia Other Findings   Reproductive/Obstetrics                             Anesthesia Physical Anesthesia Plan  ASA: III  Anesthesia Plan: General   Post-op Pain Management:    Induction: Intravenous  PONV Risk Score and Plan: 3 and Ondansetron, Dexamethasone and Treatment may vary due to age or medical condition  Airway Management Planned: Oral ETT  Additional Equipment:   Intra-op Plan:   Post-operative Plan: Extubation in OR  Informed Consent: I have reviewed the patients History and Physical, chart, labs and discussed the procedure including the risks, benefits and alternatives for the proposed anesthesia with the patient or authorized representative who has indicated his/her understanding and acceptance.     Dental advisory given  Plan Discussed with: CRNA and Surgeon  Anesthesia Plan Comments:         Anesthesia Quick Evaluation

## 2019-05-08 NOTE — Discharge Instructions (Signed)
Femoral Site Care °This sheet gives you information about how to care for yourself after your procedure. Your health care provider may also give you more specific instructions. If you have problems or questions, contact your health care provider. °What can I expect after the procedure? °After the procedure, it is common to have: °· Bruising that usually fades within 1-2 weeks. °· Tenderness at the site. °Follow these instructions at home: °Wound care °· Follow instructions from your health care provider about how to take care of your insertion site. Make sure you: °? Wash your hands with soap and water before you change your bandage (dressing). If soap and water are not available, use hand sanitizer. °? Change your dressing as told by your health care provider. °? Leave stitches (sutures), skin glue, or adhesive strips in place. These skin closures may need to stay in place for 2 weeks or longer. If adhesive strip edges start to loosen and curl up, you may trim the loose edges. Do not remove adhesive strips completely unless your health care provider tells you to do that. °· Do not take baths, swim, or use a hot tub until your health care provider approves. °· You may shower 24-48 hours after the procedure or as told by your health care provider. °? Gently wash the site with plain soap and water. °? Pat the area dry with a clean towel. °? Do not rub the site. This may cause bleeding. °· Do not apply powder or lotion to the site. Keep the site clean and dry. °· Check your femoral site every day for signs of infection. Check for: °? Redness, swelling, or pain. °? Fluid or blood. °? Warmth. °? Pus or a bad smell. °Activity °· For the first 2-3 days after your procedure, or as long as directed: °? Avoid climbing stairs as much as possible. °? Do not squat. °· Do not lift anything that is heavier than 10 lb (4.5 kg), or the limit that you are told, until your health care provider says that it is safe. °· Rest as  directed. °? Avoid sitting for a long time without moving. Get up to take short walks every 1-2 hours. °· Do not drive for 24 hours if you were given a medicine to help you relax (sedative). °General instructions °· Take over-the-counter and prescription medicines only as told by your health care provider. °· Keep all follow-up visits as told by your health care provider. This is important. °Contact a health care provider if you have: °· A fever or chills. °· You have redness, swelling, or pain around your insertion site. °Get help right away if: °· The catheter insertion area swells very fast. °· You pass out. °· You suddenly start to sweat or your skin gets clammy. °· The catheter insertion area is bleeding, and the bleeding does not stop when you hold steady pressure on the area. °· The area near or just beyond the catheter insertion site becomes pale, cool, tingly, or numb. °These symptoms may represent a serious problem that is an emergency. Do not wait to see if the symptoms will go away. Get medical help right away. Call your local emergency services (911 in the [place] Do not drive yourself to the hospital. °Summary °· After the procedure, it is common to have bruising that usually fades within 1-2 weeks. °· Check your femoral site every day for signs of infection. °· Do not lift anything that is heavier than 10 lb (4.5 kg), or the   limit that you are told, until your health care provider says that it is safe. °This information is not intended to replace advice given to you by your health care provider. Make sure you discuss any questions you have with your health care provider. °Document Released: 05/21/2014 Document Revised: 09/30/2017 Document Reviewed: 09/30/2017 °Elsevier Patient Education © 2020 [employer] Inc[employer] ° °

## 2019-05-08 NOTE — Transfer of Care (Signed)
Immediate Anesthesia Transfer of Care Note  Patient: Melissa [name]  Procedure(s) Performed: ATRIAL FIBRILLATION ABLATION (N/A )  Patient Location: Cath Lab  Anesthesia Type:General  Level of Consciousness: awake, alert  and oriented  Airway & Oxygen Therapy: Patient Spontanous Breathing  Post-op Assessment: Report given to RN, Post -op Vital signs reviewed and stable and Patient moving all extremities X 4  Post vital signs: Reviewed and stable  Last Vitals:  Vitals Value Taken Time  BP    Temp    Pulse    Resp    SpO2      Last Pain: There were no vitals filed for this visit.       Complications: No apparent anesthesia complications

## 2019-05-08 NOTE — H&P (Signed)
Melissa [name] has presented today for surgery, with the diagnosis of atrial fibrillation.  The various methods of treatment have been discussed with the patient and family. After consideration of risks, benefits and other options for treatment, the patient has consented to  Procedure(s): Catheter ablation as a surgical intervention .  Risks include but not limited to bleeding, tamponade, heart block, stroke, damage to surrounding organs, among others. The patient's history has been reviewed, patient examined, no change in status, stable for surgery.  I have reviewed the patient's chart and labs.  Questions were answered to the patient's satisfaction.    [name] Curt Bears, MD 05/08/2019 10:00 AM

## 2019-05-08 NOTE — Anesthesia Procedure Notes (Signed)
Procedure Name: Intubation Date/Time: 05/08/2019 10:41 AM Performed by: Kyung Rudd, CRNA Pre-anesthesia Checklist: Patient identified, Emergency Drugs available, Suction available, Patient being monitored and Timeout performed Patient Re-evaluated:Patient Re-evaluated prior to induction Oxygen Delivery Method: Circle system utilized Preoxygenation: Pre-oxygenation with 100% oxygen Induction Type: IV induction Ventilation: Mask ventilation without difficulty Laryngoscope Size: Mac and 3 Grade View: Grade III Tube type: Oral Tube size: 7.0 mm Number of attempts: 1 Airway Equipment and Method: Stylet Placement Confirmation: positive ETCO2 and breath sounds checked- equal and bilateral Secured at: 20 cm Tube secured with: Tape Dental Injury: Teeth and Oropharynx as per pre-operative assessment

## 2019-05-11 ENCOUNTER — Encounter (HOSPITAL_COMMUNITY): Payer: Self-pay | Admitting: Cardiology

## 2019-05-18 DIAGNOSIS — I10 Essential (primary) hypertension: Secondary | ICD-10-CM | POA: Diagnosis not present

## 2019-05-18 DIAGNOSIS — I480 Paroxysmal atrial fibrillation: Secondary | ICD-10-CM | POA: Diagnosis not present

## 2019-05-30 IMAGING — CR PORTABLE CHEST - 1 VIEW
1 series · 1 of 1 positions shown · non-contrast
Comparison: Chest radiograph dated 02/01/2019

CLINICAL DATA: 67-year-old female with AFib.

EXAM:
PORTABLE CHEST 1 VIEW

[portable]
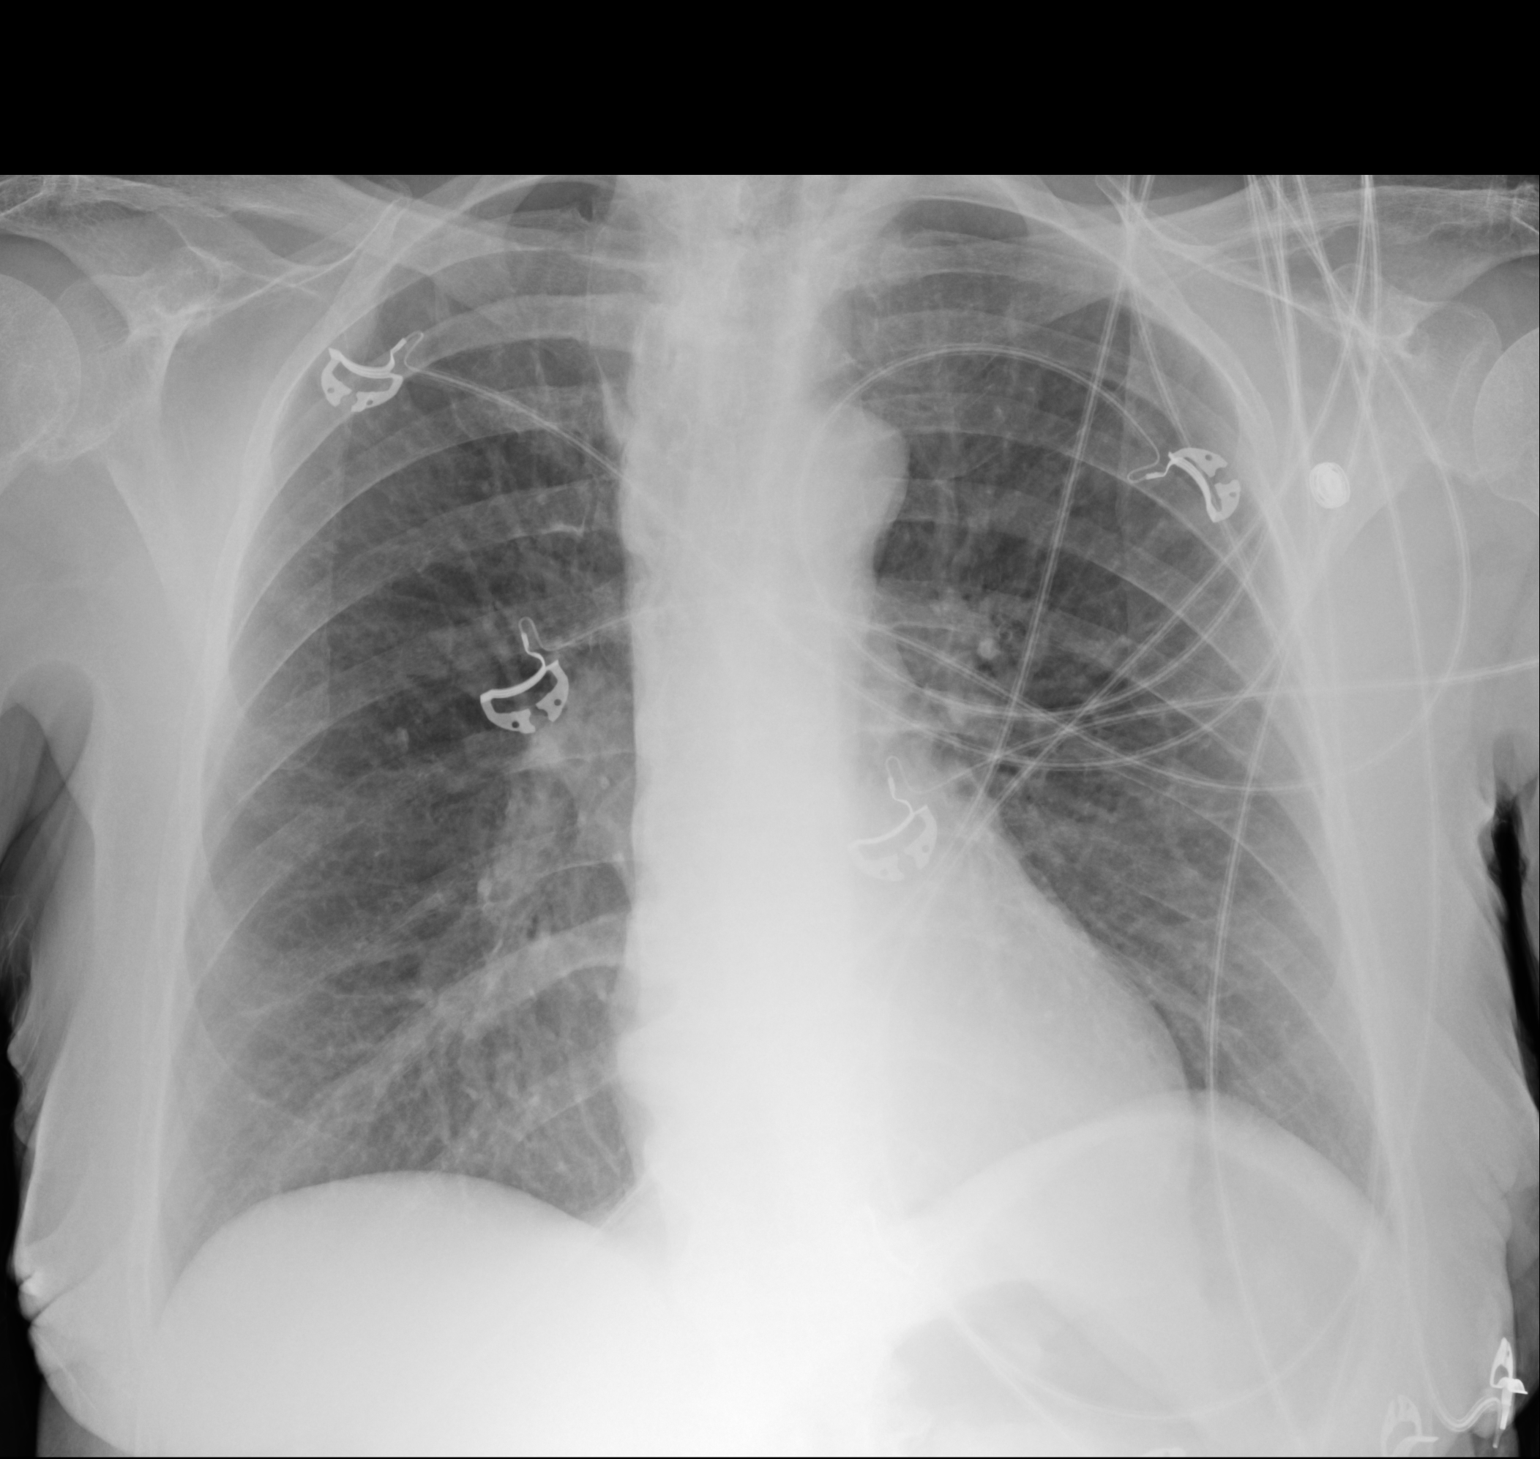

[1 of 1 positions shown; findings below may reference images not displayed]

FINDINGS: There is no focal consolidation, pleural effusion, or pneumothorax.
The cardiac silhouette is within normal limits. No acute osseous
pathology.
IMPRESSION: No active disease.

## 2019-06-09 ENCOUNTER — Ambulatory Visit (HOSPITAL_COMMUNITY)
Admission: RE | Admit: 2019-06-09 | Discharge: 2019-06-09 | Disposition: A | Discharge status: Home / Self Care | Payer: PPO | Source: Ambulatory Visit | Attending: Physician Assistant | Admitting: Physician Assistant

## 2019-06-09 ENCOUNTER — Other Ambulatory Visit: Payer: Self-pay

## 2019-06-09 ENCOUNTER — Encounter (HOSPITAL_COMMUNITY): Payer: Self-pay | Admitting: Physician Assistant

## 2019-06-09 VITALS — BP 160/70 | HR 57 | Ht 67.0 in | Wt 161.8 lb

## 2019-06-09 DIAGNOSIS — I10 Essential (primary) hypertension: Secondary | ICD-10-CM | POA: Insufficient documentation

## 2019-06-09 DIAGNOSIS — I480 Paroxysmal atrial fibrillation: Secondary | ICD-10-CM | POA: Diagnosis not present

## 2019-06-09 DIAGNOSIS — E039 Hypothyroidism, unspecified: Secondary | ICD-10-CM | POA: Insufficient documentation

## 2019-06-09 DIAGNOSIS — Z7901 Long term (current) use of anticoagulants: Secondary | ICD-10-CM | POA: Diagnosis not present

## 2019-06-09 DIAGNOSIS — Z79899 Other long term (current) drug therapy: Secondary | ICD-10-CM | POA: Insufficient documentation

## 2019-06-09 DIAGNOSIS — Z8673 Personal history of transient ischemic attack (TIA), and cerebral infarction without residual deficits: Secondary | ICD-10-CM | POA: Diagnosis not present

## 2019-06-09 MED ORDER — FLECAINIDE ACETATE 100 MG PO TABS: 50.0000 mg | ORAL_TABLET | Freq: Two times a day (BID) | ORAL | 6 refills | Status: DC

## 2019-06-09 NOTE — Progress Notes (Signed)
Primary Care Physician: Asencion Noble, MD Primary Cardiologist: Dr Bronson Ing Primary Electrophysiologist: Dr Curt Bears Referring Physician: Dr Debbe Mounts Melissa [name] is a 68 y.o. female with a history of paroxysmal atrial fibrillation, CVA, and HTN who presents for follow up in the [facility]. Patient underwent afib ablation with Dr Curt Bears on 05/08/19 and has done very well since then. She has not had any heart racing or palpitations. She denies any chest pain, groin, or swallowing issues.  Today, she denies symptoms of palpitations, chest pain, shortness of breath, orthopnea, PND, lower extremity edema, dizziness, presyncope, syncope, snoring, daytime somnolence, bleeding, or neurologic sequela. The patient is tolerating medications without difficulties and is otherwise without complaint today.    Atrial Fibrillation Risk Factors:  she does not have symptoms or diagnosis of sleep apnea.   she has a BMI of Body mass index is 25.34 kg/m.Marland Kitchen Filed Weights   06/09/19 1145  Weight: 73.4 kg    Family History  Problem Relation Age of Onset  . CVA Maternal Grandfather 67  . Colon cancer Neg Hx      Atrial Fibrillation Management history:  Previous antiarrhythmic drugs: flecainide Previous cardioversions: none Previous ablations: 05/08/19 CHADS2VASC score: 5 Anticoagulation history: Xarelto   Past Medical History:  Diagnosis Date  . Atrial fibrillation ([place])   . 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 Hypertension   . Hyperthyroidism    remote  . Uterine cancer [facility])    Past Surgical History:  Procedure Laterality Date  . ABDOMINAL HYSTERECTOMY    . ATRIAL FIBRILLATION ABLATION N/A 05/08/2019   Procedure: ATRIAL FIBRILLATION ABLATION;  Surgeon: Constance Haw, MD;  Location: [place] [facility] LAB;  Service: Cardiovascular;  Laterality: N/A;  . CHOLECYSTECTOMY    . COLONOSCOPY N/A 05/12/2014   Procedure: COLONOSCOPY;  Surgeon: Daneil Dolin, MD;  [facility];  Service: Endoscopy;  Laterality: N/A;  1130  . COLONOSCOPY N/A 07/26/2017   Procedure: COLONOSCOPY;  Surgeon: Daneil Dolin, MD;  [facility];  Service: Endoscopy;  Laterality: N/A;  1:00 pm  . REPLACEMENT TOTAL KNEE BILATERAL     bilateral knees replaced twice    Current Outpatient Medications  Medication Sig Dispense Refill  . acetaminophen (TYLENOL) 500 MG tablet Take 500 mg by mouth every 6 (six) hours as needed (pain).     Marland Kitchen ALPRAZolam (XANAX) 0.25 MG tablet Take 1 tablet (0.25 mg total) by mouth 3 (three) times daily as needed for anxiety or sleep. 10 tablet 0  . amLODipine (NORVASC) 2.5 MG tablet Take 2.5 mg by mouth daily. Total daily dose=7.5 mg    . amLODipine (NORVASC) 5 MG tablet Take 7.5 mg by mouth daily. Total daily dose=7.5 mg    . escitalopram (LEXAPRO) 5 MG tablet Take 5 mg by mouth daily.    . flecainide (TAMBOCOR) 100 MG tablet Take 0.5 tablets (50 mg total) by mouth 2 (two) times daily. 60 tablet 6  . metoprolol succinate (TOPROL XL) 25 MG 24 hr tablet Take 1 tablet (25 mg total) by mouth daily. (Patient taking differently: Take 6.25 mg by mouth 2 (two) times daily. ) 30 tablet 0  . Multiple Vitamins-Minerals (MULTIVITAMIN WITH MINERALS) tablet Take 1 tablet by mouth daily.    Marland Kitchen olmesartan (BENICAR) 40 MG tablet Take 40 mg by mouth every evening.     . rivaroxaban (XARELTO) 20 MG TABS tablet Take 1 tablet (20 mg total) by mouth  daily with supper. 30 tablet 0   No current facility-administered medications for this encounter.     No Known Allergies  Social History   Socioeconomic History  . Marital status: Married    Spouse name: Not on file  . Number of children: 1  . Years of education: college  . Highest education level: Not on file  Occupational History  . Occupation: s    Employer: [employer]  . Financial resource strain: Not on file  . Food insecurity    Worry: Not on file     Inability: Not on file  . Transportation needs    Medical: Not on file    Non-medical: Not on file  Tobacco Use  . Smoking status: Never Smoker  . Smokeless tobacco: Never Used  . Tobacco comment: Never smoked  Substance and Sexual Activity  . Alcohol use: No    Alcohol/week: 0.0 standard drinks  . Drug use: No  . Sexual activity: Not on file  Lifestyle  . Physical activity    Days per week: Not on file    Minutes per session: Not on file  . Stress: Not on file  Relationships  . Social Herbalist on phone: Not on file    Gets together: Not on file    Attends religious service: Not on file    Active member of club or organization: Not on file    Attends meetings of clubs or organizations: Not on file    Relationship status: Not on file  . Intimate partner violence    Fear of current or ex partner: Not on file    Emotionally abused: Not on file    Physically abused: Not on file    Forced sexual activity: Not on file  Other Topics Concern  . Not on file  Social History Narrative   Patient lives at home with her husband.   Patient drinks soda and coffee diet.     ROS- All systems are reviewed and negative except as per the HPI above.  Physical Exam: Vitals:   06/09/19 1145  BP: (!) 160/70  Pulse: (!) 57  Weight: 73.4 kg  Height: 5\' 7"  (1.702 m)    GEN- The patient is well appearing, alert and oriented x 3 today.   Head- normocephalic, atraumatic Eyes-  Sclera clear, conjunctiva pink Ears- hearing intact Oropharynx- clear Neck- supple  Lungs- Clear to ausculation bilaterally, normal work of breathing Heart- Regular rate and rhythm, no murmurs, rubs or gallops  GI- soft, NT, ND, + BS Extremities- no clubbing, cyanosis, or edema MS- no significant deformity or atrophy Skin- no rash or lesion Psych- euthymic mood, full affect Neuro- strength and sensation are intact  Wt Readings from Last 3 Encounters:  06/09/19 73.4 kg  05/08/19 67.1 kg  04/17/19  66.2 kg    EKG today demonstrates SB HR 57, 1st degree AV block, slow R wave prog, LAD, PR 268, QRS 102, QTc 459  Echo 02/04/19 demonstrated   1. The left ventricle has normal systolic function with an ejection fraction of 60-65%. The cavity size was normal. Left ventricular diastolic parameters were normal. No evidence of left ventricular regional wall motion abnormalities.  2. The right ventricle has normal systolic function. The cavity was normal. There is no increase in right ventricular wall thickness. Right ventricular systolic pressure normal with an estimated pressure of 28.8 mmHg.  3. Mildly thickened tricuspid valve leaflets. There is mild tricuspid regurgitation.  4. The aortic valve is tricuspid. Mild aortic annular calcification noted.  5. The mitral valve is grossly normal. Mild thickening of the mitral valve leaflet. There is mild mitral regurgitation.  6. The tricuspid valve is grossly normal.  7. The aortic root is normal in size and structure.  Epic records are reviewed at length today  Assessment and Plan:  1. Paroxysmal atrial fibrillation S/p afib ablation 05/08/19. Patient appears to be maintaining SR. Will decrease flecainide to 50 mg BID given lengthening of PR interval.  Continue Xarelto 20 mg daily Continue Toprol 25 mg daily  This patients CHA2DS2-VASc Score and unadjusted Ischemic Stroke Rate (% per year) is equal to 7.2 % stroke rate/year from a score of 5  Above score calculated as 1 point each if present [CHF, HTN, DM, Vascular=MI/PAD/Aortic Plaque, Age if 65-74, or Female] Above score calculated as 2 points each if present [Age > 75, or Stroke/TIA/TE]   2. HTN Elevated today. Patient reports it is within the normal range at home. Pt to keep BP log for review at follow up. No changes today.   Follow up with Dr Curt Bears as scheduled.    [facility] [address] [place], [place] [address] [phone]  06/09/2019 12:12 PM

## 2019-06-18 DIAGNOSIS — I10 Essential (primary) hypertension: Secondary | ICD-10-CM | POA: Diagnosis not present

## 2019-06-18 DIAGNOSIS — I480 Paroxysmal atrial fibrillation: Secondary | ICD-10-CM | POA: Diagnosis not present

## 2019-07-14 ENCOUNTER — Ambulatory Visit: Payer: PPO | Admitting: Cardiology

## 2019-08-13 ENCOUNTER — Encounter: Payer: Self-pay | Admitting: Cardiology

## 2019-08-13 ENCOUNTER — Ambulatory Visit (INDEPENDENT_AMBULATORY_CARE_PROVIDER_SITE_OTHER): Payer: PPO | Admitting: Cardiology

## 2019-08-13 ENCOUNTER — Other Ambulatory Visit: Payer: Self-pay

## 2019-08-13 VITALS — BP 164/80 | HR 56 | Ht 67.0 in | Wt 179.6 lb

## 2019-08-13 DIAGNOSIS — I480 Paroxysmal atrial fibrillation: Secondary | ICD-10-CM | POA: Diagnosis not present

## 2019-08-13 NOTE — Progress Notes (Signed)
Electrophysiology Office Note   Date:  08/13/2019   ID:  Melissa, [name] 16-Aug-1951, MRN [id number]  PCP:  Melissa Noble, MD  Cardiologist:  Melissa [name] Primary Electrophysiologist:  Melissa Haw, MD    No chief complaint on file.    History of Present Illness: Melissa [name] is a 68 y.o. female who is being seen today for the evaluation of atrial fibrillation at the request of Melissa Noble, MD. Presenting today for electrophysiology evaluation.  She has a history of atrial fibrillation, CVA, hypertension.  She has had multiple episodes of rapid atrial fibrillation.  She is currently on flecainide.  At this point, she would like to come off of her flecainide as she is unsure of the efficacy of the medication.  She is now status post AF ablation 05/08/2019.  Today, denies symptoms of palpitations, chest pain, shortness of breath, orthopnea, PND, lower extremity edema, claudication, dizziness, presyncope, syncope, bleeding, or neurologic sequela. The patient is tolerating medications without difficulties.  Overall she is doing well.  She is upset that she is gained approximately 20 pounds over the last few months.  Aside from that, she is noted no further episodes of atrial fibrillation.   Past Medical History:  Diagnosis Date  . Atrial fibrillation ([place])   . 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 Hypertension   . Hyperthyroidism    remote  . Uterine cancer [facility])    Past Surgical History:  Procedure Laterality Date  . ABDOMINAL HYSTERECTOMY    . ATRIAL FIBRILLATION ABLATION N/A 05/08/2019   Procedure: ATRIAL FIBRILLATION ABLATION;  Surgeon: Melissa Haw, MD;  Location: [place] CV LAB;  Service: Cardiovascular;  Laterality: N/A;  . CHOLECYSTECTOMY    . COLONOSCOPY N/A 05/12/2014   Procedure: COLONOSCOPY;  Surgeon: Melissa Dolin, MD;  [facility];  Service: Endoscopy;  Laterality: N/A;  1130  . COLONOSCOPY N/A 07/26/2017   Procedure:  COLONOSCOPY;  Surgeon: Melissa Dolin, MD;  [facility];  Service: Endoscopy;  Laterality: N/A;  1:00 pm  . REPLACEMENT TOTAL KNEE BILATERAL     bilateral knees replaced twice     Current Outpatient Medications  Medication Sig Dispense Refill  . acetaminophen (TYLENOL) 500 MG tablet Take 500 mg by mouth every 6 (six) hours as needed (pain).     Marland Kitchen ALPRAZolam (XANAX) 0.25 MG tablet Take 1 tablet (0.25 mg total) by mouth 3 (three) times daily as needed for anxiety or sleep. 10 tablet 0  . amLODipine (NORVASC) 2.5 MG tablet Take 2.5 mg by mouth daily. Total daily dose=7.5 mg    . amLODipine (NORVASC) 5 MG tablet Take 7.5 mg by mouth daily. Total daily dose=7.5 mg    . escitalopram (LEXAPRO) 5 MG tablet Take 5 mg by mouth daily.    . flecainide (TAMBOCOR) 100 MG tablet Take 0.5 tablets (50 mg total) by mouth 2 (two) times daily. 60 tablet 6  . Multiple Vitamins-Minerals (MULTIVITAMIN WITH MINERALS) tablet Take 1 tablet by mouth daily.    Marland Kitchen olmesartan (BENICAR) 40 MG tablet Take 40 mg by mouth every evening.     . rivaroxaban (XARELTO) 20 MG TABS tablet Take 1 tablet (20 mg total) by mouth daily with supper. 30 tablet 0  . metoprolol tartrate (LOPRESSOR) 25 MG tablet Take 1 tablet by mouth 2 (two) times daily.     No current facility-administered medications for this visit.     Allergies:   Patient has no  known allergies.   Social History:  The patient  reports that she has never smoked. She has never used smokeless tobacco. She reports that she does not drink alcohol or use drugs.   Family History:  The patient's family history includes CVA (age of onset: 35) in her maternal grandfather.    ROS:  Please see the history of present illness.   Otherwise, review of systems is positive for none.   All other systems are reviewed and negative.   PHYSICAL EXAM: VS:  BP (!) 164/80   Pulse (!) 56   Ht 5\' 7"  (1.702 m)   Wt 179 lb 9.6 oz (81.5 kg)   SpO2 92%   BMI 28.13 kg/m  , BMI  Body mass index is 28.13 kg/m. GEN: Well nourished, well developed, in no acute distress  HEENT: normal  Neck: no JVD, carotid bruits, or masses Cardiac: RRR; no murmurs, rubs, or gallops,no edema  Respiratory:  clear to auscultation bilaterally, normal work of breathing GI: soft, nontender, nondistended, + BS MS: no deformity or atrophy  Skin: warm and dry Neuro:  Strength and sensation are intact Psych: euthymic mood, full affect  EKG:  EKG is ordered today. Personal review of the ekg ordered shows sinus rhythm, rate 56  Recent Labs: 02/05/2019: ALT 23; B Natriuretic Peptide 42.0 02/07/2019: Magnesium 1.9; TSH 2.635 04/17/2019: Hemoglobin 13.1; Platelets 173 04/20/2019: BUN 10; Creatinine, Ser 0.69; Potassium 3.8; Sodium 130    Lipid Panel     Component Value Date/Time   CHOL 149 02/04/2019 0432   TRIG 35 02/04/2019 0432   HDL 71 02/04/2019 0432   CHOLHDL 2.1 02/04/2019 0432   VLDL 7 02/04/2019 0432   LDLCALC 71 02/04/2019 0432     Wt Readings from Last 3 Encounters:  08/13/19 179 lb 9.6 oz (81.5 kg)  06/09/19 161 lb 12.8 oz (73.4 kg)  05/08/19 148 lb (67.1 kg)      Other studies Reviewed: Additional studies/ records that were reviewed today include: TTE 02/04/2019 Review of the above records today demonstrates:  1. The left ventricle has normal systolic function with an ejection fraction of 60-65%. The cavity size was normal. Left ventricular diastolic parameters were normal. No evidence of left ventricular regional wall motion abnormalities. 2. The right ventricle has normal systolic function. The cavity was normal. There is no increase in right ventricular wall thickness. Right ventricular systolic pressure normal with an estimated pressure of 28.8 mmHg. 3. Mildly thickened tricuspid valve leaflets. There is mild tricuspid regurgitation. 4. The aortic valve is tricuspid. Mild aortic annular calcification noted. 5. The mitral valve is grossly normal. Mild thickening  of the mitral valve leaflet. There is mild mitral regurgitation. 6. The tricuspid valve is grossly normal. 7. The aortic root is normal in size and structure.    ASSESSMENT AND PLAN:  1.  Paroxysmal atrial fibrillation: Currently on Xarelto, metoprolol, and flecainide.  Is status post ablation 05/08/2019.  Remains in sinus rhythm.  We  continue her current medications.  I have encouraged her to continue with diet and exercise weight.  She would like up to 20 pounds.  This patients CHA2DS2-VASc Score and unadjusted Ischemic Stroke Rate (% per year) is equal to 7.2 % stroke rate/year from a score of 5  Above score calculated as 1 point each if present [CHF, HTN, DM, Vascular=MI/PAD/Aortic Plaque, Age if 65-74, or Female] Above score calculated as 2 points each if present [Age > 75, or Stroke/TIA/TE]   2.  CVA: Occurred  in 2015 without residual side effects  3.  Hypertension: Elevated today, but is usually in the 120s at home.  No changes at this time.    Current medicines are reviewed at length with the patient today.   The patient does not have concerns regarding her medicines.  The following changes were made today: None  Labs/ tests ordered today include:  Orders Placed This Encounter  Procedures  . EKG 12-Lead     Disposition:   FU with   3 months  Signed,  Meredith Leeds, MD  08/13/2019 12:12 PM     [place] [address] [phone] (office) [phone] (fax)

## 2019-08-13 NOTE — Patient Instructions (Signed)
Medication Instructions:  Your physician recommends that you continue on your current medications as directed. Please refer to the Current Medication list given to you today.  * If you need a refill on your cardiac medications before your next appointment, please call your pharmacy.   Labwork: None ordered  Testing/Procedures: None ordered  Follow-Up: At [facility], you and your health needs are our priority.  As part of our continuing mission to provide you with exceptional heart care, we have created designated Provider Care Teams.  These Care Teams include your primary Cardiologist (physician) and Advanced Practice Providers (APPs -  Physician Assistants and Nurse Practitioners) who all work together to provide you with the care you need, when you need it.  You will need a follow up appointment in 3 months.  Please call our office 2 months in advance to schedule this appointment.  You may see Dr Curt Bears or one of the following Advanced Practice Providers on your designated Care Team:    Chanetta Marshall, NP  Tommye Standard, PA-C  Oda [place], [place]   Thank you for choosing [facility]!!   Trinidad Curet, RN [phone]

## 2019-09-29 DIAGNOSIS — I10 Essential (primary) hypertension: Secondary | ICD-10-CM | POA: Diagnosis not present

## 2019-09-29 DIAGNOSIS — I480 Paroxysmal atrial fibrillation: Secondary | ICD-10-CM | POA: Diagnosis not present

## 2019-09-29 DIAGNOSIS — F419 Anxiety disorder, unspecified: Secondary | ICD-10-CM | POA: Diagnosis not present

## 2019-11-30 ENCOUNTER — Encounter: Payer: Self-pay | Admitting: Cardiology

## 2019-11-30 ENCOUNTER — Ambulatory Visit: Payer: PPO | Admitting: Cardiology

## 2019-11-30 ENCOUNTER — Other Ambulatory Visit: Payer: Self-pay

## 2019-11-30 VITALS — BP 162/82 | HR 62 | Ht 67.0 in | Wt 201.0 lb

## 2019-11-30 DIAGNOSIS — I480 Paroxysmal atrial fibrillation: Secondary | ICD-10-CM

## 2019-11-30 NOTE — Progress Notes (Signed)
Electrophysiology Office Note   Date:  11/30/2019   ID:  Cornellia, Wehrly 1951/07/01, MRN [id number]  PCP:  Asencion Noble, MD  Cardiologist:  Bronson Ing Primary Electrophysiologist:  Constance Haw, MD    No chief complaint on file.    History of Present Illness: VELEN FRAZE is a 69 y.o. female who is being seen today for the evaluation of atrial fibrillation at the request of Asencion Noble, MD. Presenting today for electrophysiology evaluation.  She has a history of atrial fibrillation, CVA, and hypertension.  She has had multiple episodes of rapid atrial fibrillation.  She is now status post AF ablation 05/08/2019.  Today, denies symptoms of palpitations, chest pain, shortness of breath, orthopnea, PND, lower extremity edema, claudication, dizziness, presyncope, syncope, bleeding, or neurologic sequela. The patient is tolerating medications without difficulties.  Overall she is doing well.  She has no chest pain or shortness of breath.  She has noted minimal episodes of atrial fibrillation since her ablation.  She does state that she has some gum bleeding in sinus bleeding that she mainly notices when she gets up in the morning.  This is seen with pink-tinged sputum.   Past Medical History:  Diagnosis Date  . Atrial fibrillation ([place])   . 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 Hypertension   . Hyperthyroidism    remote  . Uterine cancer [facility])    Past Surgical History:  Procedure Laterality Date  . ABDOMINAL HYSTERECTOMY    . ATRIAL FIBRILLATION ABLATION N/A 05/08/2019   Procedure: ATRIAL FIBRILLATION ABLATION;  Surgeon: Constance Haw, MD;  Location: [place] CV LAB;  Service: Cardiovascular;  Laterality: N/A;  . CHOLECYSTECTOMY    . COLONOSCOPY N/A 05/12/2014   Procedure: COLONOSCOPY;  Surgeon: Daneil Dolin, MD;  [facility];  Service: Endoscopy;  Laterality: N/A;  1130  . COLONOSCOPY N/A 07/26/2017   Procedure: COLONOSCOPY;  Surgeon:  Daneil Dolin, MD;  [facility];  Service: Endoscopy;  Laterality: N/A;  1:00 pm  . REPLACEMENT TOTAL KNEE BILATERAL     bilateral knees replaced twice     Current Outpatient Medications  Medication Sig Dispense Refill  . acetaminophen (TYLENOL) 500 MG tablet Take 500 mg by mouth every 6 (six) hours as needed (pain).     Marland Kitchen ALPRAZolam (XANAX) 0.25 MG tablet Take 1 tablet (0.25 mg total) by mouth 3 (three) times daily as needed for anxiety or sleep. 10 tablet 0  . amLODipine (NORVASC) 2.5 MG tablet Take 2.5 mg by mouth daily. Total daily dose=7.5 mg    . amLODipine (NORVASC) 5 MG tablet Take 7.5 mg by mouth daily. Total daily dose=7.5 mg    . escitalopram (LEXAPRO) 5 MG tablet Take 5 mg by mouth daily.    . flecainide (TAMBOCOR) 100 MG tablet Take 0.5 tablets (50 mg total) by mouth 2 (two) times daily. 60 tablet 6  . metoprolol tartrate (LOPRESSOR) 25 MG tablet Take 6.25 mg by mouth 2 (two) times daily.     . Multiple Vitamins-Minerals (MULTIVITAMIN WITH MINERALS) tablet Take 1 tablet by mouth daily.    Marland Kitchen olmesartan (BENICAR) 40 MG tablet Take 40 mg by mouth every evening.     . rivaroxaban (XARELTO) 20 MG TABS tablet Take 1 tablet (20 mg total) by mouth daily with supper. 30 tablet 0   No current facility-administered medications for this visit.    Allergies:   Patient has no known allergies.  Social History:  The patient  reports that she has never smoked. She has never used smokeless tobacco. She reports that she does not drink alcohol or use drugs.   Family History:  The patient's family history includes CVA (age of onset: 37) in her maternal grandfather.    ROS:  Please see the history of present illness.   Otherwise, review of systems is positive for none.   All other systems are reviewed and negative.   PHYSICAL EXAM: VS:  BP (!) 162/82   Pulse 62   Ht 5\' 7"  (1.702 m)   Wt 201 lb (91.2 kg)   SpO2 96%   BMI 31.48 kg/m  , BMI Body mass index is 31.48  kg/m. GEN: Well nourished, well developed, in no acute distress  HEENT: normal  Neck: no JVD, carotid bruits, or masses Cardiac: RRR; no murmurs, rubs, or gallops,no edema  Respiratory:  clear to auscultation bilaterally, normal work of breathing GI: soft, nontender, nondistended, + BS MS: no deformity or atrophy  Skin: warm and dry Neuro:  Strength and sensation are intact Psych: euthymic mood, full affect  EKG:  EKG is ordered today. Personal review of the ekg ordered shows sinus rhythm, rate 62  Recent Labs: 02/05/2019: ALT 23; B Natriuretic Peptide 42.0 02/07/2019: Magnesium 1.9; TSH 2.635 04/17/2019: Hemoglobin 13.1; Platelets 173 04/20/2019: BUN 10; Creatinine, Ser 0.69; Potassium 3.8; Sodium 130    Lipid Panel     Component Value Date/Time   CHOL 149 02/04/2019 0432   TRIG 35 02/04/2019 0432   HDL 71 02/04/2019 0432   CHOLHDL 2.1 02/04/2019 0432   VLDL 7 02/04/2019 0432   LDLCALC 71 02/04/2019 0432     Wt Readings from Last 3 Encounters:  11/30/19 201 lb (91.2 kg)  08/13/19 179 lb 9.6 oz (81.5 kg)  06/09/19 161 lb 12.8 oz (73.4 kg)      Other studies Reviewed: Additional studies/ records that were reviewed today include: TTE 02/04/2019 Review of the above records today demonstrates:  1. The left ventricle has normal systolic function with an ejection fraction of 60-65%. The cavity size was normal. Left ventricular diastolic parameters were normal. No evidence of left ventricular regional wall motion abnormalities. 2. The right ventricle has normal systolic function. The cavity was normal. There is no increase in right ventricular wall thickness. Right ventricular systolic pressure normal with an estimated pressure of 28.8 mmHg. 3. Mildly thickened tricuspid valve leaflets. There is mild tricuspid regurgitation. 4. The aortic valve is tricuspid. Mild aortic annular calcification noted. 5. The mitral valve is grossly normal. Mild thickening of the mitral valve  leaflet. There is mild mitral regurgitation. 6. The tricuspid valve is grossly normal. 7. The aortic root is normal in size and structure.    ASSESSMENT AND PLAN:  1.  Paroxysmal atrial fibrillation: Currently on Xarelto, metoprolol, flecainide.  Status post AF ablation 05/08/2019.  CHA2DS2-VASc of 5.  She is having some gum bleeding on her Xarelto.  We Jonnie Kubly switch her to Eliquis.  She does state that she flosses and brushes her teeth twice daily.   2.  CVA: Occurred in 2015 without residual side effects.  3.  Hypertension: Elevated today.  Her blood pressures in the low 130s at home.  Make no further changes.    Current medicines are reviewed at length with the patient today.   The patient does not have concerns regarding her medicines.  The following changes were made today: Stop Xarelto, start Eliquis  Labs/ tests  ordered today include:  Orders Placed This Encounter  Procedures  . EKG 12-Lead     Disposition:   FU with Lori Liew 6 months  Signed, Chantelle Verdi Meredith Leeds, MD  11/30/2019 3:58 PM     [facility] [address] [place] [address] [phone] (office) [phone] (fax)

## 2019-11-30 NOTE — Patient Instructions (Signed)
Medication Instructions:  Your physician has recommended you make the following change in your medication:  1. STOP Xarelto 2. START Eliquis 5 mg twice a day   *If you need a refill on your cardiac medications before your next appointment, please call your pharmacy*   Lab Work: None ordered If you have labs (blood work) drawn today and your tests are completely normal, you will receive your results only by: Marland Kitchen MyChart Message (if you have MyChart) OR . A paper copy in the mail If you have any lab test that is abnormal or we need to change your treatment, we will call you to review the results.   Testing/Procedures: None ordered   Follow-Up: At [facility], you and your health needs are our priority.  As part of our continuing mission to provide you with exceptional heart care, we have created designated Provider Care Teams.  These Care Teams include your primary Cardiologist (physician) and Advanced Practice Providers (APPs -  Physician Assistants and Nurse Practitioners) who all work together to provide you with the care you need, when you need it.  Your next appointment:   6 month(s)  The format for your next appointment:   In Person  Provider:   Allegra Lai, MD

## 2019-12-24 DIAGNOSIS — M1990 Unspecified osteoarthritis, unspecified site: Secondary | ICD-10-CM | POA: Diagnosis not present

## 2019-12-24 DIAGNOSIS — I480 Paroxysmal atrial fibrillation: Secondary | ICD-10-CM | POA: Diagnosis not present

## 2019-12-24 DIAGNOSIS — I10 Essential (primary) hypertension: Secondary | ICD-10-CM | POA: Diagnosis not present

## 2019-12-24 DIAGNOSIS — Z79899 Other long term (current) drug therapy: Secondary | ICD-10-CM | POA: Diagnosis not present

## 2019-12-24 DIAGNOSIS — I618 Other nontraumatic intracerebral hemorrhage: Secondary | ICD-10-CM | POA: Diagnosis not present

## 2019-12-25 ENCOUNTER — Other Ambulatory Visit: Payer: Self-pay | Admitting: Cardiology

## 2019-12-31 DIAGNOSIS — F419 Anxiety disorder, unspecified: Secondary | ICD-10-CM | POA: Diagnosis not present

## 2019-12-31 DIAGNOSIS — I480 Paroxysmal atrial fibrillation: Secondary | ICD-10-CM | POA: Diagnosis not present

## 2019-12-31 DIAGNOSIS — I10 Essential (primary) hypertension: Secondary | ICD-10-CM | POA: Diagnosis not present

## 2020-02-10 ENCOUNTER — Telehealth: Payer: Self-pay | Admitting: Cardiology

## 2020-02-10 MED ORDER — FLECAINIDE ACETATE 100 MG PO TABS: 100.0000 mg | ORAL_TABLET | Freq: Two times a day (BID) | ORAL | 3 refills | Status: DC

## 2020-02-10 NOTE — Telephone Encounter (Signed)
*  STAT* If patient is at the pharmacy, call can be transferred to refill team.   1. Which medications need to be refilled? (please list name of each medication and dose if known)  flecainide (TAMBOCOR) 100 MG tablet  2. Which pharmacy/location (including street and city if local pharmacy) is medication to be sent to? [place], [address]  3. Do they need a 30 day or 90 day supply? 90 day supply   Patient only has two tablets left

## 2020-02-10 NOTE — Telephone Encounter (Signed)
Pt's medication was sent to pt's pharmacy as requested. Confirmation received.  °

## 2020-05-03 DIAGNOSIS — I480 Paroxysmal atrial fibrillation: Secondary | ICD-10-CM | POA: Diagnosis not present

## 2020-05-03 DIAGNOSIS — I10 Essential (primary) hypertension: Secondary | ICD-10-CM | POA: Diagnosis not present

## 2020-05-03 DIAGNOSIS — F419 Anxiety disorder, unspecified: Secondary | ICD-10-CM | POA: Diagnosis not present

## 2020-06-30 ENCOUNTER — Ambulatory Visit: Payer: PPO | Attending: Critical Care Medicine

## 2020-06-30 DIAGNOSIS — Z23 Encounter for immunization: Secondary | ICD-10-CM

## 2020-06-30 NOTE — Progress Notes (Signed)
   Covid-19 Vaccination Clinic  Name:  Melissa [name]    MRN: [id number] DOB: 05-06-1951  06/30/2020  [name] was observed post Covid-19 immunization for 15 minutes without incident. She was provided with Vaccine Information Sheet and instruction to access the V-Safe system.   [name] was instructed to call 911 with any severe reactions post vaccine: Marland Kitchen Difficulty breathing  . Swelling of face and throat  . A fast heartbeat  . A bad rash all over body  . Dizziness and weakness

## 2020-07-28 DIAGNOSIS — Z79899 Other long term (current) drug therapy: Secondary | ICD-10-CM | POA: Diagnosis not present

## 2020-07-28 DIAGNOSIS — E039 Hypothyroidism, unspecified: Secondary | ICD-10-CM | POA: Diagnosis not present

## 2020-08-04 DIAGNOSIS — E039 Hypothyroidism, unspecified: Secondary | ICD-10-CM | POA: Diagnosis not present

## 2020-08-04 DIAGNOSIS — I480 Paroxysmal atrial fibrillation: Secondary | ICD-10-CM | POA: Diagnosis not present

## 2020-08-04 DIAGNOSIS — I10 Essential (primary) hypertension: Secondary | ICD-10-CM | POA: Diagnosis not present

## 2020-08-04 DIAGNOSIS — F419 Anxiety disorder, unspecified: Secondary | ICD-10-CM | POA: Diagnosis not present

## 2020-11-04 DIAGNOSIS — I10 Essential (primary) hypertension: Secondary | ICD-10-CM | POA: Diagnosis not present

## 2020-11-04 DIAGNOSIS — I480 Paroxysmal atrial fibrillation: Secondary | ICD-10-CM | POA: Diagnosis not present

## 2021-01-26 DIAGNOSIS — F419 Anxiety disorder, unspecified: Secondary | ICD-10-CM | POA: Diagnosis not present

## 2021-01-26 DIAGNOSIS — I480 Paroxysmal atrial fibrillation: Secondary | ICD-10-CM | POA: Diagnosis not present

## 2021-01-26 DIAGNOSIS — M1990 Unspecified osteoarthritis, unspecified site: Secondary | ICD-10-CM | POA: Diagnosis not present

## 2021-01-26 DIAGNOSIS — E039 Hypothyroidism, unspecified: Secondary | ICD-10-CM | POA: Diagnosis not present

## 2021-01-26 DIAGNOSIS — Z79899 Other long term (current) drug therapy: Secondary | ICD-10-CM | POA: Diagnosis not present

## 2021-01-26 DIAGNOSIS — I10 Essential (primary) hypertension: Secondary | ICD-10-CM | POA: Diagnosis not present

## 2021-02-02 DIAGNOSIS — I10 Essential (primary) hypertension: Secondary | ICD-10-CM | POA: Diagnosis not present

## 2021-02-02 DIAGNOSIS — I480 Paroxysmal atrial fibrillation: Secondary | ICD-10-CM | POA: Diagnosis not present

## 2021-02-02 DIAGNOSIS — E02 Subclinical iodine-deficiency hypothyroidism: Secondary | ICD-10-CM | POA: Diagnosis not present

## 2021-05-05 DIAGNOSIS — I480 Paroxysmal atrial fibrillation: Secondary | ICD-10-CM | POA: Diagnosis not present

## 2021-05-05 DIAGNOSIS — I10 Essential (primary) hypertension: Secondary | ICD-10-CM | POA: Diagnosis not present

## 2021-08-07 DIAGNOSIS — I10 Essential (primary) hypertension: Secondary | ICD-10-CM | POA: Diagnosis not present

## 2021-08-07 DIAGNOSIS — B0233 Zoster keratitis: Secondary | ICD-10-CM | POA: Diagnosis not present

## 2021-08-07 DIAGNOSIS — I480 Paroxysmal atrial fibrillation: Secondary | ICD-10-CM | POA: Diagnosis not present

## 2021-09-06 NOTE — Progress Notes (Signed)
PCP:  Asencion Noble, MD Primary Cardiologist: Kate Sable, MD (Inactive) Electrophysiologist: Will Meredith Leeds, MD   Melissa [name] is a 70 y.o. female seen today for Will Meredith Leeds, MD for routine electrophysiology followup.  Since last being seen in our clinic the patient reports doing very well. She has not followed up because she has not had any issues, but now needs medication refills.  she denies chest pain, palpitations, dyspnea, PND, orthopnea, nausea, vomiting, dizziness, syncope, edema, weight gain, or early satiety.  Past Medical History:  Diagnosis Date   Atrial fibrillation ([place])    CVA (cerebral infarction) 11/2013   aphasia   Hypertension    Hyperthyroidism    remote   Uterine cancer [facility])    Past Surgical History:  Procedure Laterality Date   ABDOMINAL HYSTERECTOMY     ATRIAL FIBRILLATION ABLATION N/A 05/08/2019   Procedure: ATRIAL FIBRILLATION ABLATION;  Surgeon: Constance Haw, MD;  Location: [place] [facility];  Service: Cardiovascular;  Laterality: N/A;   CHOLECYSTECTOMY     COLONOSCOPY N/A 05/12/2014   Procedure: COLONOSCOPY;  Surgeon: Daneil Dolin, MD;  [facility];  Service: Endoscopy;  Laterality: N/A;  1130   COLONOSCOPY N/A 07/26/2017   Procedure: COLONOSCOPY;  Surgeon: Daneil Dolin, MD;  [facility];  Service: Endoscopy;  Laterality: N/A;  1:00 pm   REPLACEMENT TOTAL KNEE BILATERAL     bilateral knees replaced twice    Current Outpatient Medications  Medication Sig Dispense Refill   acetaminophen (TYLENOL) 500 MG tablet Take 500 mg by mouth every 6 (six) hours as needed (pain).      amLODipine (NORVASC) 2.5 MG tablet Take 2.5 mg by mouth daily. Total daily dose=7.5 mg     amLODipine (NORVASC) 5 MG tablet Take 7.5 mg by mouth daily. Total daily dose=7.5 mg     chlorthalidone (HYGROTON) 25 MG tablet Take 25 mg by mouth every morning.     flecainide (TAMBOCOR) 100 MG tablet Take 1 tablet (100 mg total) by  mouth 2 (two) times daily. 180 tablet 3   metoprolol tartrate (LOPRESSOR) 25 MG tablet Take 6.25 mg by mouth 2 (two) times daily.      Multiple Vitamins-Minerals (MULTIVITAMIN WITH MINERALS) tablet Take 1 tablet by mouth daily.     olmesartan (BENICAR) 40 MG tablet Take 40 mg by mouth every evening.      rivaroxaban (XARELTO) 20 MG TABS tablet Take 1 tablet (20 mg total) by mouth daily with supper. 30 tablet 0   ALPRAZolam (XANAX) 0.25 MG tablet Take 1 tablet (0.25 mg total) by mouth 3 (three) times daily as needed for anxiety or sleep. (Patient not taking: Reported on 09/07/2021) 10 tablet 0   escitalopram (LEXAPRO) 5 MG tablet Take 5 mg by mouth daily. (Patient not taking: Reported on 09/07/2021)     No current facility-administered medications for this visit.    No Known Allergies  Social History   Socioeconomic History   Marital status: Married    Spouse name: Not on file   Number of children: 1   Years of education: college   Highest education level: Not on file  Occupational History   Occupation: s    Employer: [employer]  Tobacco Use   Smoking status: Never   Smokeless tobacco: Never   Tobacco comments:    Never smoked  Vaping Use   Vaping Use: Never used  Substance and Sexual Activity   Alcohol use: No  Alcohol/week: 0.0 standard drinks   Drug use: No   Sexual activity: Not on file  Other Topics Concern   Not on file  Social History Narrative   Patient lives at home with her husband.   Patient drinks soda and coffee diet.   Social Determinants of Health   Financial Resource Strain: Not on file  Food Insecurity: Not on file  Transportation Needs: Not on file  Physical Activity: Not on file  Stress: Not on file  Social Connections: Not on file  Intimate Partner Violence: Not on file     Review of Systems: All other systems reviewed and are otherwise negative except as noted above.  Physical Exam: Vitals:   09/07/21 0839  BP: 130/80  Pulse: 62   SpO2: 97%  Weight: 203 lb 12.8 oz (92.4 kg)  Height: 5\' 8"  (1.727 m)    GEN- The patient is well appearing, alert and oriented x 3 today.   HEENT: normocephalic, atraumatic; sclera clear, conjunctiva pink; hearing intact; oropharynx clear; neck supple, no JVP Lymph- no cervical lymphadenopathy Lungs- Clear to ausculation bilaterally, normal work of breathing.  No wheezes, rales, rhonchi Heart- Regular rate and rhythm, no murmurs, rubs or gallops, PMI not laterally displaced GI- soft, non-tender, non-distended, bowel sounds present, no hepatosplenomegaly Extremities- no clubbing, cyanosis, or edema; DP/PT/radial pulses 2+ bilaterally MS- no significant deformity or atrophy Skin- warm and dry, no rash or lesion Psych- euthymic mood, full affect Neuro- strength and sensation are intact  EKG is ordered. Personal review of EKG from today shows NSR with some increase in PR interval and QRS since last visit 11/2019  Additional studies reviewed include: Previous EP office notes.   Assessment and Plan:  1.  Paroxysmal atrial fibrillation:  Currently on Xarelto, metoprolol, flecainide.  Status post AF ablation 05/08/2019.   CHA2DS2-VASc of 5 EKG today shows lengthening of both QRS and PR interval. Per Dr. Curt Bears, offered ETT to look for flecainide related arrhythmias vs down titration. She would prefer down-titration to flecainide 50 mg BID She is taking 1/4 tablet of lopressor 25 BID, totaling 12.5 mg daily.  We will change to toprol for simplicities sake.  EF 01/2019 60-65%.  Could also consider tikosyn in the future if needs to come off flecainide all together.     2. H/o  CVA:  Occurred in 2015 without residual side effects. No change   3.  HTN Stable on current regimen.   She has follow up already scheduled for 10/31/21 with Dr. Curt Bears. We will leave in place to re-evaluate her EKG and explore other options for her arrhythmias (if needed)  Shirley Friar, PA-C   09/07/21 8:42 AM

## 2021-09-07 ENCOUNTER — Encounter: Payer: Self-pay | Admitting: Student

## 2021-09-07 ENCOUNTER — Ambulatory Visit: Payer: PPO | Admitting: Student

## 2021-09-07 ENCOUNTER — Other Ambulatory Visit: Payer: Self-pay

## 2021-09-07 VITALS — BP 130/80 | HR 62 | Ht 68.0 in | Wt 203.8 lb

## 2021-09-07 DIAGNOSIS — I10 Essential (primary) hypertension: Secondary | ICD-10-CM

## 2021-09-07 DIAGNOSIS — I619 Nontraumatic intracerebral hemorrhage, unspecified: Secondary | ICD-10-CM

## 2021-09-07 DIAGNOSIS — I480 Paroxysmal atrial fibrillation: Secondary | ICD-10-CM

## 2021-09-07 MED ORDER — FLECAINIDE ACETATE 50 MG PO TABS: 50.0000 mg | ORAL_TABLET | Freq: Two times a day (BID) | ORAL | 3 refills | Status: DC

## 2021-09-07 MED ORDER — METOPROLOL SUCCINATE ER 25 MG PO TB24: 12.5000 mg | ORAL_TABLET | Freq: Every day | ORAL | 3 refills | Status: DC

## 2021-09-07 NOTE — Patient Instructions (Signed)
Medication Instructions:  Your physician has recommended you make the following change in your medication:   DECREASE: Flecainide to 50mg  twice daily STOP: Metoprolol Tartrate START: Metoprolol Succinate 12.5mg  daily at bedtime  *If you need a refill on your cardiac medications before your next appointment, please call your pharmacy*   Lab Work: None If you have labs (blood work) drawn today and your tests are completely normal, you will receive your results only by: [place] (if you have MyChart) OR A paper copy in the mail If you have any lab test that is abnormal or we need to change your treatment, we will call you to review the results.   Follow-Up: At [facility], you and your health needs are our priority.  As part of our continuing mission to provide you with exceptional heart care, we have created designated Provider Care Teams.  These Care Teams include your primary Cardiologist (physician) and Advanced Practice Providers (APPs -  Physician Assistants and Nurse Practitioners) who all work together to provide you with the care you need, when you need it.  Your next appointment:   As scheduled

## 2021-09-13 ENCOUNTER — Observation Stay (HOSPITAL_BASED_OUTPATIENT_CLINIC_OR_DEPARTMENT_OTHER): Payer: PPO

## 2021-09-13 ENCOUNTER — Encounter (HOSPITAL_COMMUNITY): Payer: Self-pay

## 2021-09-13 ENCOUNTER — Other Ambulatory Visit: Payer: Self-pay

## 2021-09-13 ENCOUNTER — Emergency Department (HOSPITAL_COMMUNITY): Payer: PPO

## 2021-09-13 ENCOUNTER — Observation Stay (HOSPITAL_COMMUNITY)
Admission: EM | Admit: 2021-09-13 | Discharge: 2021-09-14 | Disposition: A | Discharge status: Home / Self Care | Payer: PPO | Attending: Internal Medicine | Admitting: Internal Medicine

## 2021-09-13 DIAGNOSIS — E876 Hypokalemia: Secondary | ICD-10-CM | POA: Diagnosis not present

## 2021-09-13 DIAGNOSIS — Z7901 Long term (current) use of anticoagulants: Secondary | ICD-10-CM | POA: Diagnosis not present

## 2021-09-13 DIAGNOSIS — E871 Hypo-osmolality and hyponatremia: Secondary | ICD-10-CM

## 2021-09-13 DIAGNOSIS — Z20822 Contact with and (suspected) exposure to covid-19: Secondary | ICD-10-CM | POA: Diagnosis not present

## 2021-09-13 DIAGNOSIS — E039 Hypothyroidism, unspecified: Secondary | ICD-10-CM | POA: Insufficient documentation

## 2021-09-13 DIAGNOSIS — R55 Syncope and collapse: Principal | ICD-10-CM | POA: Diagnosis present

## 2021-09-13 DIAGNOSIS — I10 Essential (primary) hypertension: Secondary | ICD-10-CM | POA: Diagnosis not present

## 2021-09-13 DIAGNOSIS — R1084 Generalized abdominal pain: Secondary | ICD-10-CM

## 2021-09-13 DIAGNOSIS — Z79899 Other long term (current) drug therapy: Secondary | ICD-10-CM | POA: Diagnosis not present

## 2021-09-13 DIAGNOSIS — R109 Unspecified abdominal pain: Secondary | ICD-10-CM | POA: Diagnosis not present

## 2021-09-13 DIAGNOSIS — I4891 Unspecified atrial fibrillation: Secondary | ICD-10-CM | POA: Diagnosis not present

## 2021-09-13 DIAGNOSIS — I480 Paroxysmal atrial fibrillation: Secondary | ICD-10-CM

## 2021-09-13 LAB — CBC WITH DIFFERENTIAL/PLATELET
Abs Immature Granulocytes: 0.07 10*3/uL (ref 0.00–0.07)
Basophils Absolute: 0 10*3/uL (ref 0.0–0.1)
Basophils Relative: 1 %
Eosinophils Absolute: 0.1 10*3/uL (ref 0.0–0.5)
Eosinophils Relative: 1 %
HCT: 37 % (ref 36.0–46.0)
Hemoglobin: 13.6 g/dL (ref 12.0–15.0)
Immature Granulocytes: 1 %
Lymphocytes Relative: 14 %
Lymphs Abs: 1.1 10*3/uL (ref 0.7–4.0)
MCH: 32.2 pg (ref 26.0–34.0)
MCHC: 36.8 g/dL — ABNORMAL HIGH (ref 30.0–36.0)
MCV: 87.7 fL (ref 80.0–100.0)
Monocytes Absolute: 0.7 10*3/uL (ref 0.1–1.0)
Monocytes Relative: 9 %
Neutro Abs: 5.9 10*3/uL (ref 1.7–7.7)
Neutrophils Relative %: 74 %
Platelets: 209 10*3/uL (ref 150–400)
RBC: 4.22 MIL/uL (ref 3.87–5.11)
RDW: 12.1 % (ref 11.5–15.5)
WBC: 7.8 10*3/uL (ref 4.0–10.5)
nRBC: 0 % (ref 0.0–0.2)

## 2021-09-13 LAB — COMPREHENSIVE METABOLIC PANEL
ALT: 17 U/L (ref 0–44)
AST: 20 U/L (ref 15–41)
Albumin: 4.2 g/dL (ref 3.5–5.0)
Alkaline Phosphatase: 82 U/L (ref 38–126)
Anion gap: 12 (ref 5–15)
BUN: 14 mg/dL (ref 8–23)
CO2: 27 mmol/L (ref 22–32)
Calcium: 9.5 mg/dL (ref 8.9–10.3)
Chloride: 85 mmol/L — ABNORMAL LOW (ref 98–111)
Creatinine, Ser: 0.94 mg/dL (ref 0.44–1.00)
GFR, Estimated: >60 mL/min (ref >60)
Glucose, Bld: 112 mg/dL — ABNORMAL HIGH (ref 70–99)
Potassium: 3 mmol/L — ABNORMAL LOW (ref 3.5–5.1)
Sodium: 124 mmol/L — ABNORMAL LOW (ref 135–145)
Total Bilirubin: 1.1 mg/dL (ref 0.3–1.2)
Total Protein: 7.2 g/dL (ref 6.5–8.1)

## 2021-09-13 LAB — URINALYSIS, ROUTINE W REFLEX MICROSCOPIC
Bilirubin Urine: NEGATIVE
Glucose, UA: NEGATIVE mg/dL
Hgb urine dipstick: NEGATIVE
Ketones, ur: 5 mg/dL — AB
Leukocytes,Ua: NEGATIVE
Nitrite: NEGATIVE
Protein, ur: NEGATIVE mg/dL
Specific Gravity, Urine: 1.016 (ref 1.005–1.030)
pH: 6 (ref 5.0–8.0)

## 2021-09-13 LAB — ECHOCARDIOGRAM COMPLETE
AR max vel: 2.39 cm2
AV Area VTI: 2.37 cm2
AV Area mean vel: 2.31 cm2
AV Mean grad: 5 mmHg
AV Peak grad: 10.8 mmHg
Ao pk vel: 1.64 m/s
Area-P 1/2: 2.71 cm2
Height: 68 in
MV VTI: 3.46 cm2
S' Lateral: 2.2 cm
Weight: 3168 oz

## 2021-09-13 LAB — SODIUM, URINE, RANDOM: Sodium, Ur: 41 mmol/L

## 2021-09-13 LAB — RESP PANEL BY RT-PCR (FLU A&B, COVID) ARPGX2
Influenza A by PCR: NEGATIVE
Influenza B by PCR: NEGATIVE
SARS Coronavirus 2 by RT PCR: NEGATIVE

## 2021-09-13 LAB — OSMOLALITY: Osmolality: 260 mOsm/kg — ABNORMAL LOW (ref 275–295)

## 2021-09-13 LAB — TSH: TSH: 1.473 u[IU]/mL (ref 0.350–4.500)

## 2021-09-13 LAB — LIPASE, BLOOD: Lipase: 40 U/L (ref 11–51)

## 2021-09-13 MED ORDER — SODIUM CHLORIDE 0.9 % IV SOLN
INTRAVENOUS | Status: DC
  Administered 2021-09-13: 09:00:00 1000 mL via INTRAVENOUS

## 2021-09-13 MED ORDER — LOPERAMIDE HCL 2 MG PO CAPS: 2.0000 mg | ORAL_CAPSULE | ORAL | Status: DC | PRN

## 2021-09-13 MED ORDER — METOPROLOL SUCCINATE ER 25 MG PO TB24
12.5000 mg | ORAL_TABLET | Freq: Every day | ORAL | Status: DC
  Administered 2021-09-13: 21:00:00 12.5 mg via ORAL
  Filled 2021-09-13: qty 1

## 2021-09-13 MED ORDER — ACETAMINOPHEN 500 MG PO TABS: 500.0000 mg | ORAL_TABLET | Freq: Four times a day (QID) | ORAL | Status: DC | PRN

## 2021-09-13 MED ORDER — FLECAINIDE ACETATE 50 MG PO TABS
50.0000 mg | ORAL_TABLET | Freq: Two times a day (BID) | ORAL | Status: DC
  Administered 2021-09-13 – 2021-09-14 (×2): 50 mg via ORAL
  Filled 2021-09-13 (×2): qty 1

## 2021-09-13 MED ORDER — IOHEXOL 300 MG/ML  SOLN
100.0000 mL | Freq: Once | INTRAMUSCULAR | Status: AC | PRN
  Administered 2021-09-13: 10:00:00 100 mL via INTRAVENOUS

## 2021-09-13 MED ORDER — ONDANSETRON HCL 4 MG/2ML IJ SOLN
4.0000 mg | Freq: Once | INTRAMUSCULAR | Status: AC
  Administered 2021-09-13: 09:00:00 4 mg via INTRAVENOUS
  Filled 2021-09-13: qty 2

## 2021-09-13 MED ORDER — ADULT MULTIVITAMIN W/MINERALS CH
1.0000 | ORAL_TABLET | Freq: Every day | ORAL | Status: DC
  Administered 2021-09-13 – 2021-09-14 (×2): 1 via ORAL
  Filled 2021-09-13 (×2): qty 1

## 2021-09-13 MED ORDER — ONDANSETRON HCL 4 MG PO TABS: 4.0000 mg | ORAL_TABLET | Freq: Four times a day (QID) | ORAL | Status: DC | PRN

## 2021-09-13 MED ORDER — POTASSIUM CHLORIDE CRYS ER 20 MEQ PO TBCR
40.0000 meq | EXTENDED_RELEASE_TABLET | Freq: Once | ORAL | Status: AC
  Administered 2021-09-13: 13:00:00 40 meq via ORAL
  Filled 2021-09-13: qty 2

## 2021-09-13 MED ORDER — ALPRAZOLAM 0.25 MG PO TABS
0.2500 mg | ORAL_TABLET | Freq: Three times a day (TID) | ORAL | Status: DC | PRN
  Administered 2021-09-13: 21:00:00 0.25 mg via ORAL
  Filled 2021-09-13: qty 1

## 2021-09-13 MED ORDER — POTASSIUM CHLORIDE IN NACL 20-0.9 MEQ/L-% IV SOLN
Freq: Once | INTRAVENOUS | Status: AC
  Filled 2021-09-13: qty 1000

## 2021-09-13 MED ORDER — AMLODIPINE BESYLATE 5 MG PO TABS
7.5000 mg | ORAL_TABLET | Freq: Every day | ORAL | Status: DC
  Administered 2021-09-14: 7.5 mg via ORAL
  Filled 2021-09-13: qty 2

## 2021-09-13 MED ORDER — ONDANSETRON HCL 4 MG/2ML IJ SOLN: 4.0000 mg | Freq: Four times a day (QID) | INTRAMUSCULAR | Status: DC | PRN

## 2021-09-13 MED ORDER — IRBESARTAN 150 MG PO TABS
300.0000 mg | ORAL_TABLET | Freq: Every day | ORAL | Status: DC
  Administered 2021-09-13 – 2021-09-14 (×2): 300 mg via ORAL
  Filled 2021-09-13 (×2): qty 2

## 2021-09-13 MED ORDER — RIVAROXABAN 20 MG PO TABS
20.0000 mg | ORAL_TABLET | Freq: Every day | ORAL | Status: DC
  Administered 2021-09-13: 21:00:00 20 mg via ORAL
  Filled 2021-09-13: qty 1

## 2021-09-13 NOTE — ED Notes (Signed)
Cardiology at bedside assessing at this time.

## 2021-09-13 NOTE — Progress Notes (Signed)
*  PRELIMINARY RESULTS* Echocardiogram 2D Echocardiogram has been performed.  Elpidio Anis 09/13/2021, 3:38 PM

## 2021-09-13 NOTE — ED Triage Notes (Signed)
Complaints of nausea, abdominal pain with a pulsating feeling, and diarrhea for 4 weeks.

## 2021-09-13 NOTE — ED Provider Notes (Signed)
[facility] EMERGENCY DEPARTMENT Provider Note   CSN: [id number] Arrival date & time: 09/13/21  3295     History Chief Complaint  Patient presents with   Abdominal Pain    Melissa [name] is a 70 y.o. female.  HPI Patient presents with her husband who assists with history. Patient episode of syncope yesterday, but presents due to concern for ongoing lower abdominal pain.  Symptoms been present for about 3 weeks, with associated loose stool.  There is some pulsating feeling in the mid lower abdomen as well.  Prior to this patient had an episode of shingles, and her head, but was generally well.  Now, over the past 3 weeks she has taken no medication for relief has not spoken with her physician, but complains of ongoing discomfort, mild, across the lower abdomen, nonradiating.  No chest pain, no dyspnea, yesterday's event seems to have occurred while defecating.  Husband notes that he found her passed out on the toilet.  She does not recall pain before, or afterwards or any other details surrounding that event.    Past Medical History:  Diagnosis Date   Atrial fibrillation ([place])    CVA (cerebral infarction) 11/2013   aphasia   Hypertension    Hyperthyroidism    remote   Uterine cancer [facility])     Patient Active Problem List   Diagnosis Date Noted   Paroxysmal atrial fibrillation ([place]) 05/08/2019   Hyponatremia 02/08/2019   Atrial fibrillation ([place]) 02/08/2019   Palpitations    Atrial fibrillation with RVR ([place]) 08/31/2017   Hypertensive urgency 08/31/2017   Cognitive deficits, late effect of cerebrovascular disease 05/24/2014   Abdominal pain, unspecified site 04/26/2014   Hyperglycemia 02/17/2014   Severe obesity (BMI >= 40) ([place]) 01/28/2014   Aphasia due to recent cerebrovascular accident 01/07/2014   History of small thalamic hemorrhage with stroke 2015 12/02/2013   ADENOCARCINOMA, ENDOMETRIUM 08/11/2007   History of hyperthyroidism 05/16/2007   Essential hypertension  05/16/2007   OSTEOARTHRITIS 05/16/2007    Past Surgical History:  Procedure Laterality Date   ABDOMINAL HYSTERECTOMY     ATRIAL FIBRILLATION ABLATION N/A 05/08/2019   Procedure: ATRIAL FIBRILLATION ABLATION;  Surgeon: Constance Haw, MD;  Location: [place] [facility];  Service: Cardiovascular;  Laterality: N/A;   CHOLECYSTECTOMY     COLONOSCOPY N/A 05/12/2014   Procedure: COLONOSCOPY;  Surgeon: Daneil Dolin, MD;  [facility];  Service: Endoscopy;  Laterality: N/A;  1130   COLONOSCOPY N/A 07/26/2017   Procedure: COLONOSCOPY;  Surgeon: Daneil Dolin, MD;  [facility];  Service: Endoscopy;  Laterality: N/A;  1:00 pm   REPLACEMENT TOTAL KNEE BILATERAL     bilateral knees replaced twice     OB History   No obstetric history on file.     Family History  Problem Relation Age of Onset   CVA Maternal Grandfather 90   Colon cancer Neg Hx     Social History   Tobacco Use   Smoking status: Never   Smokeless tobacco: Never   Tobacco comments:    Never smoked  Vaping Use   Vaping Use: Never used  Substance Use Topics   Alcohol use: No    Alcohol/week: 0.0 standard drinks   Drug use: No    Home Medications Prior to Admission medications   Medication Sig Start Date End Date Taking? Authorizing Provider  acetaminophen (TYLENOL) 500 MG tablet Take 500 mg by mouth every 6 (six) hours as needed (pain).  [provider]  ALPRAZolam Duanne Moron) 0.25 MG tablet Take 1 tablet (0.25 mg total) by mouth 3 (three) times daily as needed for anxiety or sleep. Patient not taking: Reported on 09/07/2021 02/05/19   Heath Lark D, DO  amLODipine (NORVASC) 2.5 MG tablet Take 2.5 mg by mouth daily. Total daily dose=7.5 mg 04/10/19   [provider]  amLODipine (NORVASC) 5 MG tablet Take 7.5 mg by mouth daily. Total daily dose=7.5 mg    [provider]  chlorthalidone (HYGROTON) 25 MG tablet Take 25 mg by mouth every morning. 08/28/21   [provider]  escitalopram (LEXAPRO) 5 MG tablet Take 5 mg by mouth daily. Patient not taking: Reported on 09/07/2021    [provider]  flecainide (TAMBOCOR) 50 MG tablet Take 1 tablet (50 mg total) by mouth 2 (two) times daily. 09/07/21   Shirley Friar, PA-C  metoprolol succinate (TOPROL XL) 25 MG 24 hr tablet Take 0.5 tablets (12.5 mg total) by mouth at bedtime. 09/07/21   Shirley Friar, PA-C  Multiple Vitamins-Minerals (MULTIVITAMIN WITH MINERALS) tablet Take 1 tablet by mouth daily.    [provider]  olmesartan (BENICAR) 40 MG tablet Take 40 mg by mouth every evening.  12/01/18   [provider]  rivaroxaban (XARELTO) 20 MG TABS tablet Take 1 tablet (20 mg total) by mouth daily with supper. 02/01/19   Triplett, Tammy, PA-C    Allergies    Patient has no known allergies.  Review of Systems   Review of Systems  Constitutional:        Per HPI, otherwise negative  HENT:         Per HPI, otherwise negative  Respiratory:         Per HPI, otherwise negative  Cardiovascular:        Per HPI, otherwise negative  Gastrointestinal:  Positive for abdominal pain, diarrhea and nausea. Negative for vomiting.  Endocrine:       Negative aside from HPI  Genitourinary:        Neg aside from HPI   Musculoskeletal:        Per HPI, otherwise negative  Skin: Negative.   Neurological:  Positive for syncope and weakness.   Physical Exam Updated Vital Signs BP (!) 149/69    Pulse 74    Temp 98.4 F (36.9 C) (Oral)    Resp 20    Ht 5\' 8"  (1.727 m)    Wt 89.8 kg    SpO2 100%    BMI 30.11 kg/m   Physical Exam Vitals and nursing note reviewed.  Constitutional:      General: She is not in acute distress.    Appearance: She is well-developed. She is obese.  HENT:     Head: Normocephalic and atraumatic.  Eyes:     Conjunctiva/sclera: Conjunctivae normal.  Cardiovascular:     Rate and Rhythm: Normal rate and regular rhythm.  Pulmonary:     Effort:  Pulmonary effort is normal. No respiratory distress.     Breath sounds: Normal breath sounds. No stridor.  Abdominal:     General: There is no distension.     Tenderness: There is abdominal tenderness in the right lower quadrant, suprapubic area and left lower quadrant.  Skin:    General: Skin is warm and dry.  Neurological:     Mental Status: She is alert and oriented to person, place, and time.     Cranial Nerves: No cranial nerve deficit.  ED Results / Procedures / Treatments   Labs (all labs ordered are listed, but only abnormal results are displayed) Labs Reviewed  COMPREHENSIVE METABOLIC PANEL - Abnormal; Notable for the following components:      Result Value   Sodium 124 (*)    Potassium 3.0 (*)    Chloride 85 (*)    Glucose, Bld 112 (*)    All other components within normal limits  CBC WITH DIFFERENTIAL/PLATELET - Abnormal; Notable for the following components:   MCHC 36.8 (*)    All other components within normal limits  RESP PANEL BY RT-PCR (FLU A&B, COVID) ARPGX2  LIPASE, BLOOD  URINALYSIS, ROUTINE W REFLEX MICROSCOPIC    EKG EKG Interpretation  Date/Time:  Wednesday September 13 2021 08:55:57 EST Ventricular Rate:  71 PR Interval:    QRS Duration: 127 QT Interval:  452 QTC Calculation: 492 R Axis:   3 Text Interpretation: Sinus rhythm with 1st degree A-V block Nonspecific intraventricular conduction delay Artifact Baseline wander in lead(s) III Abnormal ECG Confirmed by Carmin Muskrat [phone]) on 09/13/2021 10:02:53 AM  Radiology CT ABDOMEN PELVIS W CONTRAST  Result Date: 09/13/2021 CLINICAL DATA:  Abdominal pain nausea, diarrhea EXAM: CT ABDOMEN AND PELVIS WITH CONTRAST TECHNIQUE: Multidetector CT imaging of the abdomen and pelvis was performed using the standard protocol following bolus administration of intravenous contrast. CONTRAST:  133mL OMNIPAQUE IOHEXOL 300 MG/ML  SOLN COMPARISON:  04/08/2014 FINDINGS: Lower chest: Unremarkable. Hepatobiliary: No  focal abnormality is seen in the liver. There is no dilation of bile ducts. There is evidence of previous cholecystectomy. Pancreas: No focal abnormality is seen. Spleen: Unremarkable. Adrenals/Urinary Tract: Adrenals are not enlarged. There is no hydronephrosis. There are no renal or ureteral stones. Urinary bladder is unremarkable. Stomach/Bowel: Stomach is unremarkable. Small bowel loops are not dilated. Appendix is not dilated. There is no significant wall thickening in colon. There is no pericolic stranding or fluid collection. Vascular/Lymphatic: Unremarkable. Reproductive: Uterus is not seen. Other: There is no ascites or pneumoperitoneum. Musculoskeletal: Degenerative changes are noted in the lumbar spine with disc space narrowing, bony spurs and facet hypertrophy. Spinal stenosis and encroachment of neural foramina is seen at multiple levels in the lumbar spine. IMPRESSION: There is no evidence intestinal obstruction or pneumoperitoneum. There is no hydronephrosis. Appendix is not dilated. Severe lumbar spondylosis with spinal stenosis and encroachment of neural foramina at multiple levels. Electronically Signed   By: Elmer Picker M.D.   On: 09/13/2021 10:07    Procedures Procedures   Medications Ordered in ED Medications  0.9 %  sodium chloride infusion (1,000 mLs Intravenous New Bag/Given 09/13/21 0853)  0.9 % NaCl with KCl 20 mEq/ L  infusion (has no administration in time range)  ondansetron (ZOFRAN) injection 4 mg (4 mg Intravenous Given 09/13/21 0856)  iohexol (OMNIPAQUE) 300 MG/ML solution 100 mL (100 mLs Intravenous Contrast Given 09/13/21 9604)    ED Course  I have reviewed the triage vital signs and the nursing notes.  Pertinent labs & imaging results that were available during my care of the patient were reviewed by me and considered in my medical decision making (see chart for details).  Cardiac 70s sinus normal Pulse ox 100% room air normal 11:18 AM On repeat exam  the patient is in similar condition.  Blood pressure now improved, however. She has begun receive fluid resuscitation. I reviewed her CT scan, discussed with her and her husband.  I also discussed her case with her cardiologist, and reviewed the chart including documentation  from last week's visit.  Plan from that visit as below: Assessment and Plan:   1.  Paroxysmal atrial fibrillation:  Currently on Xarelto, metoprolol, flecainide.  Status post AF ablation 05/08/2019.   CHA2DS2-VASc of 5 EKG today shows lengthening of both QRS and PR interval. Per Dr. Curt Bears, offered ETT to look for flecainide related arrhythmias vs down titration. She would prefer down-titration to flecainide 50 mg BID She is taking 1/4 tablet of lopressor 25 BID, totaling 12.5 mg daily.  We will change to toprol for simplicities sake.  EF 01/2019 60-65%.  Could also consider tikosyn in the future if needs to come off flecainide all together.    With consideration of syncope, hyponatremia, hypokalemia, and known arrhythmia patient will be admitted for further monitoring, management, electrolyte correction.  Recommendation from cardiology is telemetry monitoring, electrolyte correction as above. MDM Rules/Calculators/A&P MDM Number of Diagnoses or Management Options Generalized abdominal pain: new, needed workup Hypokalemia: new, needed workup Hyponatremia: new, needed workup Syncope and collapse: new, needed workup   Amount and/or Complexity of Data Reviewed Clinical lab tests: ordered and reviewed Tests in the radiology section of CPT: ordered and reviewed Tests in the medicine section of CPT: reviewed and ordered Decide to obtain previous medical records or to obtain history from someone other than the patient: yes Obtain history from someone other than the patient: yes Review and summarize past medical records: yes Discuss the patient with other providers: yes Independent visualization of images, tracings, or  specimens: yes  Risk of Complications, Morbidity, and/or Mortality Presenting problems: high Diagnostic procedures: high Management options: high  Critical Care Total time providing critical care: < 30 minutes  Patient Progress Patient progress: stable   Final Clinical Impression(s) / ED Diagnoses Final diagnoses:  Syncope and collapse  Hyponatremia  Hypokalemia  Generalized abdominal pain     Carmin Muskrat, MD 09/13/21 1121

## 2021-09-13 NOTE — Consult Note (Addendum)
Cardiology Consultation:   Patient ID: Melissa [name] MRN: [id number]; DOB: 01-Apr-1951  Admit date: 09/13/2021 Date of Consult: 09/13/2021  PCP:  Asencion Noble, MD   [facility] Providers Cardiologist: Previously Dr. Bronson Ing Electrophysiologist:  Will Meredith Leeds, MD       Patient Profile:   Melissa [name] is a 70 y.o. female with a past medical history of paroxysmal atrial fibrillation (s/p ablation in 05/2019 and on Flecainide), prior CVA (in 2015), HTN and history of uterine cancer who is being seen 09/13/2021 for the evaluation of syncope at the request of Dr. Vanita Panda.  History of Present Illness:   Melissa [name] was recently examined by Oda Kilts, PA-C on 09/07/2021 and reported overall doing well at that time and denied any recent chest pain or palpitations. The EKG at the time of her visit did show lengthening of both her QRS and PR interval, therefore options were reviewed in regards to ETT to look for Flecainide related arrhythmias or down titration and she preferred down titration at that time to 50 mg twice daily. She was also transitioned from Lopressor to Toprol-XL 12.5mg  daily.  In talking with the patient today, she reports worsening weakness since being diagnosed with shingles approximately 3 weeks ago. She was treated with possible Valtrex at that time but is unsure of the exact medication or dose. Reports a decreased appetite since but says she has been staying hydrated. Yesterday while having a bowel movement, she reports she was straining at that time and had a syncopal episode. Her husband believes she lost consciousness for approximately 5 minutes. Says that she could tell something was going to happen but denies any associated chest pain or palpitations at that time. No recent dyspnea on exertion, PND or lower extremity edema. Reports she did stop Chlorthalidone this past weekend as it was making her feel drained.  Initial labs upon arrival show WBC 7.8,  Hgb 13.6, platelets 209, Na+ 124, +3.0 and creatinine 0.94. Lipase normal. UA pending. Negative for COVID-19 and Influenza. CT Abdomen showed no acute abnormalities but noted to have severe lumbar spondylosis with spinal stenosis. EKG shows normal sinus rhythm, heart rate 71 with first-degree AV block and IVCD.   Past Medical History:  Diagnosis Date   Atrial fibrillation ([place])    CVA (cerebral infarction) 11/2013   aphasia   Hypertension    Hyperthyroidism    remote   Uterine cancer [facility])     Past Surgical History:  Procedure Laterality Date   ABDOMINAL HYSTERECTOMY     ATRIAL FIBRILLATION ABLATION N/A 05/08/2019   Procedure: ATRIAL FIBRILLATION ABLATION;  Surgeon: Constance Haw, MD;  Location: [facility];  Service: Cardiovascular;  Laterality: N/A;   CHOLECYSTECTOMY     COLONOSCOPY N/A 05/12/2014   Procedure: COLONOSCOPY;  Surgeon: Daneil Dolin, MD;  Location: [facility];  Service: Endoscopy;  Laterality: N/A;  1130   COLONOSCOPY N/A 07/26/2017   Procedure: COLONOSCOPY;  Surgeon: Daneil Dolin, MD;  Location: [facility];  Service: Endoscopy;  Laterality: N/A;  1:00 pm   REPLACEMENT TOTAL KNEE BILATERAL     bilateral knees replaced twice     Home Medications:  Prior to Admission medications   Medication Sig Start Date End Date Taking? Authorizing Provider  acetaminophen (TYLENOL) 500 MG tablet Take 500 mg by mouth every 6 (six) hours as needed (pain).     [provider]  ALPRAZolam Duanne Moron) 0.25 MG tablet Take 1 tablet (0.25 mg total) by  mouth 3 (three) times daily as needed for anxiety or sleep. Patient not taking: Reported on 09/07/2021 02/05/19   Heath Lark D, DO  amLODipine (NORVASC) 2.5 MG tablet Take 2.5 mg by mouth daily. Total daily dose=7.5 mg 04/10/19   [provider]  amLODipine (NORVASC) 5 MG tablet Take 7.5 mg by mouth daily. Total daily dose=7.5 mg    [provider]  chlorthalidone (HYGROTON) 25 MG tablet Take 25 mg by  mouth every morning. 08/28/21   [provider]  escitalopram (LEXAPRO) 5 MG tablet Take 5 mg by mouth daily. Patient not taking: Reported on 09/07/2021    [provider]  flecainide (TAMBOCOR) 50 MG tablet Take 1 tablet (50 mg total) by mouth 2 (two) times daily. 09/07/21   Shirley Friar, PA-C  metoprolol succinate (TOPROL XL) 25 MG 24 hr tablet Take 0.5 tablets (12.5 mg total) by mouth at bedtime. 09/07/21   Shirley Friar, PA-C  Multiple Vitamins-Minerals (MULTIVITAMIN WITH MINERALS) tablet Take 1 tablet by mouth daily.    [provider]  olmesartan (BENICAR) 40 MG tablet Take 40 mg by mouth every evening.  12/01/18   [provider]  rivaroxaban (XARELTO) 20 MG TABS tablet Take 1 tablet (20 mg total) by mouth daily with supper. 02/01/19   Kem Parkinson, PA-C    Inpatient Medications: Scheduled Meds:  Continuous Infusions:  sodium chloride 1,000 mL (09/13/21 0853)   0.9 % NaCl with KCl 20 mEq / L     PRN Meds:   Allergies:   No Known Allergies  Social History:   Social History   Tobacco Use   Smoking status: Never   Smokeless tobacco: Never   Tobacco comments:    Never smoked  Substance Use Topics   Alcohol use: No    Alcohol/week: 0.0 standard drinks     Family History:    Family History  Problem Relation Age of Onset   CVA Maternal Grandfather 40   Colon cancer Neg Hx      ROS:  Please see the history of present illness.   All other ROS reviewed and negative.     Physical Exam/Data:   Vitals:   09/13/21 0824 09/13/21 0827 09/13/21 1000 09/13/21 1030  BP: (!) 166/75  (!) 163/65 (!) 149/69  Pulse: 74     Resp: 20     Temp: 98.4 F (36.9 C)     TempSrc: Oral     SpO2: 100%     Weight:  89.8 kg    Height:  5\' 8"  (1.727 m)     No intake or output data in the 24 hours ending 09/13/21 1104 Last 3 Weights 09/13/2021 09/07/2021 11/30/2019  Weight (lbs) 198 lb 203 lb 12.8 oz 201 lb  Weight (kg) 89.812 kg  92.443 kg 91.173 kg     Body mass index is 30.11 kg/m.  General:  Well nourished, well developed female appearing in no acute distress HEENT: normal Neck: no JVD Vascular: No carotid bruits; Distal pulses 2+ bilaterally Cardiac:  normal S1, S2; RRR; no murmur. Lungs:  clear to auscultation bilaterally, no wheezing, rhonchi or rales  Abd: soft, nontender, no hepatomegaly  Ext: no pitting edema Musculoskeletal:  No deformities, BUE and BLE strength normal and equal Skin: warm and dry  Neuro:  CNs 2-12 intact, no focal abnormalities noted Psych:  Normal affect   EKG:  The EKG was personally reviewed and demonstrates: normal sinus rhythm, heart rate 71 with first-degree AV block  and IVCD.  Relevant CV Studies:  Cardiac MRI: 05/2019  IMPRESSION: 1.  Calcium Score 47 which is 66 th percentile for age and sex   2.  No LAA thrombus normal atrial sizes   3.  No ASD/PFO   4.  Normal PV anatomy including right middle PV   5.  No pericardial effusion   6.  Normal aortic root 3.4 cm   7.  Esophagus courses closest to the RLPV ostium  Echocardiogram: 01/2019 IMPRESSIONS     1. The left ventricle has normal systolic function with an ejection  fraction of 60-65%. The cavity size was normal. Left ventricular diastolic  parameters were normal. No evidence of left ventricular regional wall  motion abnormalities.   2. The right ventricle has normal systolic function. The cavity was  normal. There is no increase in right ventricular wall thickness. Right  ventricular systolic pressure normal with an estimated pressure of 28.8  mmHg.   3. Mildly thickened tricuspid valve leaflets. There is mild tricuspid  regurgitation.   4. The aortic valve is tricuspid. Mild aortic annular calcification  noted.   5. The mitral valve is grossly normal. Mild thickening of the mitral  valve leaflet. There is mild mitral regurgitation.   6. The tricuspid valve is grossly normal.   7. The aortic root  is normal in size and structure.    Laboratory Data:  Chemistry Recent Labs  Lab 09/13/21 0846  NA 124*  K 3.0*  CL 85*  CO2 27  GLUCOSE 112*  BUN 14  CREATININE 0.94  CALCIUM 9.5  GFRNONAA >60  ANIONGAP 12    Recent Labs  Lab 09/13/21 0846  PROT 7.2  ALBUMIN 4.2  AST 20  ALT 17  ALKPHOS 82  BILITOT 1.1    Hematology Recent Labs  Lab 09/13/21 0846  WBC 7.8  RBC 4.22  HGB 13.6  HCT 37.0  MCV 87.7  MCH 32.2  MCHC 36.8*  RDW 12.1  PLT 209    Radiology/Studies:  CT ABDOMEN PELVIS W CONTRAST  Result Date: 09/13/2021 CLINICAL DATA:  Abdominal pain nausea, diarrhea EXAM: CT ABDOMEN AND PELVIS WITH CONTRAST TECHNIQUE: Multidetector CT imaging of the abdomen and pelvis was performed using the standard protocol following bolus administration of intravenous contrast. CONTRAST:  187mL OMNIPAQUE IOHEXOL 300 MG/ML  SOLN COMPARISON:  04/08/2014 FINDINGS: Lower chest: Unremarkable. Hepatobiliary: No focal abnormality is seen in the liver. There is no dilation of bile ducts. There is evidence of previous cholecystectomy. Pancreas: No focal abnormality is seen. Spleen: Unremarkable. Adrenals/Urinary Tract: Adrenals are not enlarged. There is no hydronephrosis. There are no renal or ureteral stones. Urinary bladder is unremarkable. Stomach/Bowel: Stomach is unremarkable. Small bowel loops are not dilated. Appendix is not dilated. There is no significant wall thickening in colon. There is no pericolic stranding or fluid collection. Vascular/Lymphatic: Unremarkable. Reproductive: Uterus is not seen. Other: There is no ascites or pneumoperitoneum. Musculoskeletal: Degenerative changes are noted in the lumbar spine with disc space narrowing, bony spurs and facet hypertrophy. Spinal stenosis and encroachment of neural foramina is seen at multiple levels in the lumbar spine. IMPRESSION: There is no evidence intestinal obstruction or pneumoperitoneum. There is no hydronephrosis. Appendix is  not dilated. Severe lumbar spondylosis with spinal stenosis and encroachment of neural foramina at multiple levels. Electronically Signed   By: Elmer Picker M.D.   On: 09/13/2021 10:07     Assessment and Plan:   1. Syncope - Unclear etiology but occurred in the  setting of having a bowel movement which makes a cardiac arrhythmia less likely.  Reports decreased PO intake over the past 3 weeks and found to be hyponatremic and hypokalemic on admission. - Would continue to follow on telemetry this admission and plan to obtain a follow-up echocardiogram along with orthostatic vital signs. - Will continue Flecainide at her recently reduced dose of 50 mg twice daily as she does have both QRS widening and PR interval lengthening on her EKG today. Will plan for a repeat EKG prior to discharge for reassessment and she may end up requiring a GXT to look for any inducible arrhythmias once recovered from her acute illness.  2. Paroxysmal Atrial fibrillation - She is s/p ablation in 05/2019 and has been on Flecainide. This was recently reduced from 100 mg twice daily to 50 mg twice daily on 09/07/2021. Would continue at the reduced dose given her QRS widening. Also remains on Toprol-XL 12.5 mg daily. - No reports of active bleeding. Remains on Xarelto for anticoagulation.  3. HTN - SBP currently in the 140's. Continue to follow on admission with resumption of PTA Toprol-XL 12.5 mg daily, Benicar 40 mg daily and Amlodipine 7.5 mg daily. She did stop Chlorthalidone this past weekend and will continue to hold for now given her electrolyte abnormalities.  4. Hyponatremia/Hypokalemia - She does have chronic hyponatremia but values are significantly lower than prior values, at 124 currently. K+ also low at 3.0. She has been started on IV fluids with potassium supplementation. Further work-up per the admitting team.    For questions or updates, please contact [place] Please consult [email or web address] for  contact info under    Signed, Erma Heritage, PA-C  09/13/2021 11:04 AM    Attending note:  Patient seen and examined.  I reviewed her records and discussed the case with Melissa [name], I agree with her above findings.  She presents complaining of relative fatigue and weakness over the last 3 weeks since being diagnosed with shingles, was treated with a 1 week course of Valtrex.  She has had poor appetite, trying to stay hydrated but not eating well.  Yesterday while having a bowel movement she was found by her husband leaned back, apparently had lost consciousness.  He states that she has had an episode like this in the past.  She does not recall any specifics at the time regarding straining, no palpitations or chest pain.  She has a history of paroxysmal atrial fibrillation followed by Dr. Curt Bears and recently seen in the atrial fibrillation clinic at which point her flecainide was decreased due to increased PR interval and QRS duration.  She was also transition from Lopressor to Toprol-XL.  She does not describe any palpitations recently.  Also states that she was taken off chlorthalidone this past weekend per direction of Dr. Willey Blade to see if that would help her "washed out" feeling.  On examination she appears comfortable.  She is afebrile, heart rate 90-100 and sinus rhythm by telemetry which I personally reviewed.  Systolic blood pressure 149F to 180s.  Lungs are clear.  Cardiac exam reveals RRR without gallop.  No peripheral edema.  Pertinent lab work includes sodium 124, potassium 3.0, chloride 85, BUN 14, creatinine 0.94, AST 20, ALT 17, WBC 7.8, hemoglobin 13.6, platelets 209, influenza and COVID-19 negative.  Abdominal and pelvic CT shows no acute findings.  She does have significant lumbar spondylosis and spinal stenosis.  Patient is being admitted to the hospitalist service for further work-up.  Etiology of presentation is not entirely clear.  It could have been that she  had an episode of vasovagal syncope while having a bowel movement, this would obviously not explain her recent weakness and fatigue after shingles outbreak about 3 weeks ago.  She has had poor appetite so would suggest orthostatic vital signs.  She is on flecainide for control of PAF, currently in sinus rhythm.  Agree with recent reduction in flecainide dose to 50 mg twice daily in light of prolonged PR interval and QRS duration.  Would ultimately benefit from a GXT to exclude proarrhythmia, although not entirely clear that this is an explanation for her presentation either.  Would follow on telemetry and also check an echocardiogram to ensure normal LVEF.  Her electrolyte abnormalities also need further work-up.  She was on chlorthalidone recently but this was stopped on Saturday.  Would hydrate and supplement potassium.  May need further work-up for SIADH per primary team.  Satira Sark, M.D., F.A.C.C.

## 2021-09-13 NOTE — ED Notes (Signed)
Reports syncopal episode while on toilet yesterday.  Reports was straining with bm.  Reports diarrhea on and off x 3 weeks since shingles dx.  Denies any urinary symptoms.

## 2021-09-13 NOTE — H&P (Signed)
History and Physical    Melissa [name] BJY:[id number] DOB: [birth year] DOA: 09/13/2021  PCP: Asencion Noble, MD   Patient coming from: Home  Chief Complaint: Syncope  HPI: Melissa [name] is a 70 y.o. female with medical history significant for paroxysmal atrial fibrillation with prior ablation 05/2019, hypertension, prior CVA, and prior history of uterine cancer who presented to the ED with syncopal episode noted yesterday evening.  Husband states that he found her sitting on the commode and she was unconscious for approximately 5 minutes.  Patient does not recall the event and states that she did not have any preceding symptoms, but has had shingles in the last 3 weeks with diarrhea noted once or twice a day.  She has also had ongoing nausea, but with no vomiting and states that her appetite has been diminished.  She states that she had called her PCP due to feeling drained and had stopped her chlorthalidone this past weekend.  She has recently followed up with her electrophysiologist with recently reduced dose of flecainide to 50 mg twice daily.   ED Course: Vital signs stable patient afebrile.  CT abdomen and pelvis with no acute findings noted and serum sodium levels noted to be 124.  Potassium is 3.  She has been started on normal saline and potassium supplementation.  She has been seen by cardiology with recommendations to continue on telemetry monitoring.  Review of Systems: Reviewed as noted above, otherwise negative.  Past Medical History:  Diagnosis Date   Atrial fibrillation ([place])    CVA (cerebral infarction) 11/2013   aphasia   Hypertension    Hyperthyroidism    remote   Uterine cancer [facility])     Past Surgical History:  Procedure Laterality Date   ABDOMINAL HYSTERECTOMY     ATRIAL FIBRILLATION ABLATION N/A 05/08/2019   Procedure: ATRIAL FIBRILLATION ABLATION;  Surgeon: Constance Haw, MD;  Location: [place] [facility];  Service: Cardiovascular;  Laterality: N/A;    CHOLECYSTECTOMY     COLONOSCOPY N/A 05/12/2014   Procedure: COLONOSCOPY;  Surgeon: Daneil Dolin, MD;  Location: [facility];  Service: Endoscopy;  Laterality: N/A;  1130   COLONOSCOPY N/A 07/26/2017   Procedure: COLONOSCOPY;  Surgeon: Daneil Dolin, MD;  Location: [facility];  Service: Endoscopy;  Laterality: N/A;  1:00 pm   REPLACEMENT TOTAL KNEE BILATERAL     bilateral knees replaced twice     reports that she has never smoked. She has never used smokeless tobacco. She reports that she does not drink alcohol and does not use drugs.  Allergies  Allergen Reactions   Chloromycetin [Chloramphenicol] Nausea And Vomiting    Pt states this "makes her sick"    Family History  Problem Relation Age of Onset   CVA Maternal Grandfather 68   Colon cancer Neg Hx     Prior to Admission medications   Medication Sig Start Date End Date Taking? Authorizing Provider  acetaminophen (TYLENOL) 500 MG tablet Take 500 mg by mouth every 6 (six) hours as needed (pain).    Yes [provider]  ALPRAZolam (XANAX) 0.25 MG tablet Take 1 tablet (0.25 mg total) by mouth 3 (three) times daily as needed for anxiety or sleep. 02/05/19  Yes Cannon Quinton D, DO  amLODipine (NORVASC) 2.5 MG tablet Take 2.5 mg by mouth daily. Total daily dose=7.5 mg 04/10/19  Yes [provider]  amLODipine (NORVASC) 5 MG tablet Take 7.5 mg by mouth daily. Total daily dose=7.5 mg  Yes [provider]  flecainide (TAMBOCOR) 50 MG tablet Take 1 tablet (50 mg total) by mouth 2 (two) times daily. 09/07/21  Yes Shirley Friar, PA-C  metoprolol succinate (TOPROL XL) 25 MG 24 hr tablet Take 0.5 tablets (12.5 mg total) by mouth at bedtime. 09/07/21  Yes Shirley Friar, PA-C  Multiple Vitamins-Minerals (MULTIVITAMIN WITH MINERALS) tablet Take 1 tablet by mouth daily.   Yes [provider]  olmesartan (BENICAR) 40 MG tablet Take 40 mg by mouth every evening.  12/01/18  Yes [provider]  rivaroxaban (XARELTO) 20 MG TABS tablet Take 1 tablet (20 mg total) by mouth daily with supper. 02/01/19  Yes Kem Parkinson, PA-C    Physical Exam: Vitals:   09/13/21 0824 09/13/21 0827 09/13/21 1000 09/13/21 1030  BP: (!) 166/75  (!) 163/65 (!) 149/69  Pulse: 74     Resp: 20     Temp: 98.4 F (36.9 C)     TempSrc: Oral     SpO2: 100%     Weight:  89.8 kg    Height:  5\' 8"  (1.727 m)      Constitutional: NAD, calm, comfortable, obese Vitals:   09/13/21 0824 09/13/21 0827 09/13/21 1000 09/13/21 1030  BP: (!) 166/75  (!) 163/65 (!) 149/69  Pulse: 74     Resp: 20     Temp: 98.4 F (36.9 C)     TempSrc: Oral     SpO2: 100%     Weight:  89.8 kg    Height:  5\' 8"  (1.727 m)     Eyes: lids and conjunctivae normal Neck: normal, supple Respiratory: clear to auscultation bilaterally. Normal respiratory effort. No accessory muscle use.  Cardiovascular: Regular rate and rhythm, no murmurs. Abdomen: no tenderness, no distention. Bowel sounds positive.  Musculoskeletal:  No edema. Skin: no rashes, lesions, ulcers.  Psychiatric: Flat affect  Labs on Admission: I have personally reviewed following labs and imaging studies  CBC: Recent Labs  Lab 09/13/21 0846  WBC 7.8  NEUTROABS 5.9  HGB 13.6  HCT 37.0  MCV 87.7  PLT 967   Basic Metabolic Panel: Recent Labs  Lab 09/13/21 0846  NA 124*  K 3.0*  CL 85*  CO2 27  GLUCOSE 112*  BUN 14  CREATININE 0.94  CALCIUM 9.5   GFR: Estimated Creatinine Clearance: 64.4 mL/min (by C-G formula based on SCr of 0.94 mg/dL). Liver Function Tests: Recent Labs  Lab 09/13/21 0846  AST 20  ALT 17  ALKPHOS 82  BILITOT 1.1  PROT 7.2  ALBUMIN 4.2   Recent Labs  Lab 09/13/21 0846  LIPASE 40   No results for input(s): AMMONIA in the last 168 hours. Coagulation Profile: No results for input(s): INR, PROTIME in the last 168 hours. Cardiac Enzymes: No results for input(s): CKTOTAL, CKMB, CKMBINDEX, TROPONINI in the  last 168 hours. BNP (last 3 results) No results for input(s): PROBNP in the last 8760 hours. HbA1C: No results for input(s): HGBA1C in the last 72 hours. CBG: No results for input(s): GLUCAP in the last 168 hours. Lipid Profile: No results for input(s): CHOL, HDL, LDLCALC, TRIG, CHOLHDL, LDLDIRECT in the last 72 hours. Thyroid Function Tests: No results for input(s): TSH, T4TOTAL, FREET4, T3FREE, THYROIDAB in the last 72 hours. Anemia Panel: No results for input(s): VITAMINB12, FOLATE, FERRITIN, TIBC, IRON, RETICCTPCT in the last 72 hours. Urine analysis:    Component Value Date/Time   COLORURINE RED (A) 04/02/2014 0310   APPEARANCEUR HAZY (A)  04/02/2014 0310   LABSPEC 1.010 04/02/2014 0310   PHURINE 6.0 04/02/2014 0310   GLUCOSEU NEGATIVE 04/02/2014 0310   HGBUR LARGE (A) 04/02/2014 0310   BILIRUBINUR NEGATIVE 04/02/2014 0310   KETONESUR NEGATIVE 04/02/2014 0310   PROTEINUR 100 (A) 04/02/2014 0310   UROBILINOGEN 0.2 04/02/2014 0310   NITRITE NEGATIVE 04/02/2014 0310   LEUKOCYTESUR MODERATE (A) 04/02/2014 0310    Radiological Exams on Admission: CT ABDOMEN PELVIS W CONTRAST  Result Date: 09/13/2021 CLINICAL DATA:  Abdominal pain nausea, diarrhea EXAM: CT ABDOMEN AND PELVIS WITH CONTRAST TECHNIQUE: Multidetector CT imaging of the abdomen and pelvis was performed using the standard protocol following bolus administration of intravenous contrast. CONTRAST:  164mL OMNIPAQUE IOHEXOL 300 MG/ML  SOLN COMPARISON:  04/08/2014 FINDINGS: Lower chest: Unremarkable. Hepatobiliary: No focal abnormality is seen in the liver. There is no dilation of bile ducts. There is evidence of previous cholecystectomy. Pancreas: No focal abnormality is seen. Spleen: Unremarkable. Adrenals/Urinary Tract: Adrenals are not enlarged. There is no hydronephrosis. There are no renal or ureteral stones. Urinary bladder is unremarkable. Stomach/Bowel: Stomach is unremarkable. Small bowel loops are not dilated. Appendix  is not dilated. There is no significant wall thickening in colon. There is no pericolic stranding or fluid collection. Vascular/Lymphatic: Unremarkable. Reproductive: Uterus is not seen. Other: There is no ascites or pneumoperitoneum. Musculoskeletal: Degenerative changes are noted in the lumbar spine with disc space narrowing, bony spurs and facet hypertrophy. Spinal stenosis and encroachment of neural foramina is seen at multiple levels in the lumbar spine. IMPRESSION: There is no evidence intestinal obstruction or pneumoperitoneum. There is no hydronephrosis. Appendix is not dilated. Severe lumbar spondylosis with spinal stenosis and encroachment of neural foramina at multiple levels. Electronically Signed   By: Elmer Picker M.D.   On: 09/13/2021 10:07    EKG: Independently reviewed. SR with 1st degree av block 71bpm.  Assessment/Plan Principal Problem:   Syncope    Syncope -Likely vasovagal versus orthostatic with some dehydration noted -Check orthostatic vital signs -Monitor on telemetry for potential cardiac arrhythmias -Continue IV fluid -Check 2D echocardiogram  Hyponatremia acute on chronic -Baseline typically near 130 -Likely related to use of thiazide as well as exacerbation from dehydration -Work-up for SIADH with urine sodium, urine and serum osmolarity and check TSH -Monitor repeat labs while on IV normal saline  Hypokalemia -Related to diarrhea -Replete and reevaluate  Paroxysmal atrial fibrillation -Continue on flecainide 50 mg twice daily and Toprol-XL 12.5 mg daily -Continue Xarelto for anticoagulation  Hypertension-elevated -Continue Toprol-XL, Benicar, and amlodipine -Hold chlorthalidone  Obesity -Lifestyle changes outpatient  DVT prophylaxis: Xarelto Code Status: Full Family Communication: Husband at bedside Disposition Plan: Admit for syncope evaluation Consults called: Cardiology Admission status: Observation, telemetry   Eastyn Dattilo D Tasmine Hipwell  DO [facility]  If 7PM-7AM, please contact night-coverage [email or web address]  09/13/2021, 12:11 PM

## 2021-09-14 DIAGNOSIS — I480 Paroxysmal atrial fibrillation: Secondary | ICD-10-CM | POA: Diagnosis not present

## 2021-09-14 DIAGNOSIS — Z7901 Long term (current) use of anticoagulants: Secondary | ICD-10-CM | POA: Diagnosis not present

## 2021-09-14 DIAGNOSIS — E039 Hypothyroidism, unspecified: Secondary | ICD-10-CM | POA: Diagnosis not present

## 2021-09-14 DIAGNOSIS — I4891 Unspecified atrial fibrillation: Secondary | ICD-10-CM | POA: Diagnosis not present

## 2021-09-14 DIAGNOSIS — Z20822 Contact with and (suspected) exposure to covid-19: Secondary | ICD-10-CM | POA: Diagnosis not present

## 2021-09-14 DIAGNOSIS — I10 Essential (primary) hypertension: Secondary | ICD-10-CM | POA: Diagnosis not present

## 2021-09-14 DIAGNOSIS — E871 Hypo-osmolality and hyponatremia: Secondary | ICD-10-CM | POA: Diagnosis not present

## 2021-09-14 DIAGNOSIS — E876 Hypokalemia: Secondary | ICD-10-CM | POA: Diagnosis not present

## 2021-09-14 DIAGNOSIS — R55 Syncope and collapse: Secondary | ICD-10-CM | POA: Diagnosis not present

## 2021-09-14 DIAGNOSIS — Z79899 Other long term (current) drug therapy: Secondary | ICD-10-CM | POA: Diagnosis not present

## 2021-09-14 LAB — CBC
HCT: 35.5 % — ABNORMAL LOW (ref 36.0–46.0)
Hemoglobin: 12.2 g/dL (ref 12.0–15.0)
MCH: 31.7 pg (ref 26.0–34.0)
MCHC: 34.4 g/dL (ref 30.0–36.0)
MCV: 92.2 fL (ref 80.0–100.0)
Platelets: 175 10*3/uL (ref 150–400)
RBC: 3.85 MIL/uL — ABNORMAL LOW (ref 3.87–5.11)
RDW: 12.6 % (ref 11.5–15.5)
WBC: 8.3 10*3/uL (ref 4.0–10.5)
nRBC: 0 % (ref 0.0–0.2)

## 2021-09-14 LAB — BASIC METABOLIC PANEL
Anion gap: 8 (ref 5–15)
BUN: 7 mg/dL — ABNORMAL LOW (ref 8–23)
CO2: 27 mmol/L (ref 22–32)
Calcium: 8.9 mg/dL (ref 8.9–10.3)
Chloride: 97 mmol/L — ABNORMAL LOW (ref 98–111)
Creatinine, Ser: 0.82 mg/dL (ref 0.44–1.00)
GFR, Estimated: >60 mL/min (ref >60)
Glucose, Bld: 89 mg/dL (ref 70–99)
Potassium: 3.6 mmol/L (ref 3.5–5.1)
Sodium: 132 mmol/L — ABNORMAL LOW (ref 135–145)

## 2021-09-14 LAB — OSMOLALITY, URINE: Osmolality, Ur: 177 mOsm/kg — ABNORMAL LOW (ref 300–900)

## 2021-09-14 LAB — MAGNESIUM: Magnesium: 1.6 mg/dL — ABNORMAL LOW (ref 1.7–2.4)

## 2021-09-14 MED ORDER — HYDRALAZINE HCL 10 MG PO TABS
10.0000 mg | ORAL_TABLET | Freq: Once | ORAL | Status: AC
  Administered 2021-09-14: 10 mg via ORAL
  Filled 2021-09-14: qty 1

## 2021-09-14 NOTE — Care Management Obs Status (Signed)
[facility] NOTIFICATION   Patient Details  Name: Melissa [name] MRN: [id number] Date of Birth: 05/02/1951   Medicare Observation Status Notification Given:  Yes    Tommy Medal 09/14/2021, 1:49 PM

## 2021-09-14 NOTE — Discharge Summary (Signed)
Physician Discharge Summary  Melissa [name] YBO:[id number] DOB: 1951/03/08 DOA: 09/13/2021  PCP: Asencion Noble, MD  Admit date: 09/13/2021 Discharge date: 09/14/2021  Admitted From: home Discharge disposition: home   Recommendations for Outpatient Follow-Up:   Adjust BP meds as able BMP 1 week   Discharge Diagnosis:   Principal Problem:   Syncope    Discharge Condition: Improved.  Diet recommendation: Low sodium, heart healthy  Wound care: None.  Code status: Full.   History of Present Illness:   Melissa [name] is a 70 y.o. female with medical history significant for paroxysmal atrial fibrillation with prior ablation 05/2019, hypertension, prior CVA, and prior history of uterine cancer who presented to the ED with syncopal episode noted yesterday evening.  Husband states that he found her sitting on the commode and she was unconscious for approximately 5 minutes.  Patient does not recall the event and states that she did not have any preceding symptoms, but has had shingles in the last 3 weeks with diarrhea noted once or twice a day.  She has also had ongoing nausea, but with no vomiting and states that her appetite has been diminished.  She states that she had called her PCP due to feeling drained and had stopped her chlorthalidone this past weekend.  She has recently followed up with her electrophysiologist with recently reduced dose of flecainide to 50 mg twice daily.   Hospital Course by Problem:    Episode of apparent syncope, possibly vasovagal in etiology with reported diarrhea.  No arrhythmias noted by cardiac monitor (only lead motion artifact), follow-up echocardiogram shows LVEF 60 to 65%, also normal RV contraction.   Hyponatremia, sodium up to 132 from 124 with IV fluids, also off chlorthalidone.   -BMP 1 week   Hypokalemia -Related to diarrhea -Replete and reevaluate   Paroxysmal atrial fibrillation with CHA2DS2-VASc score of 5.  Flecainide  dose recently reduced to 50 mg twice daily given prolonged PR interval and QRS duration.  - No ventricular arrhythmias by telemetry.  Otherwise on Toprol-XL and maintaining sinus rhythm. -Xarelto for stroke prophylaxis.   Essential hypertension, on Avapro and Norvasc.   - May need further up titration of therapy depending on blood pressure control.      Medical Consultants:   cards   Discharge Exam:   Vitals:   09/14/21 1043 09/14/21 1214  BP: (!) 186/96 (!) 175/69  Pulse: 89 68  Resp: 18 16  Temp:    SpO2:  98%   Vitals:   09/14/21 1036 09/14/21 1039 09/14/21 1043 09/14/21 1214  BP: (!) 188/64 (!) 172/64 (!) 186/96 (!) 175/69  Pulse: 78 83 89 68  Resp: 18 16 18 16   Temp:      TempSrc:      SpO2:    98%  Weight:      Height:        General exam: Appears calm and comfortable.     The results of significant diagnostics from this hospitalization (including imaging, microbiology, ancillary and laboratory) are listed below for reference.     Procedures and Diagnostic Studies:   CT ABDOMEN PELVIS W CONTRAST  Result Date: 09/13/2021 CLINICAL DATA:  Abdominal pain nausea, diarrhea EXAM: CT ABDOMEN AND PELVIS WITH CONTRAST TECHNIQUE: Multidetector CT imaging of the abdomen and pelvis was performed using the standard protocol following bolus administration of intravenous contrast. CONTRAST:  158mL OMNIPAQUE IOHEXOL 300 MG/ML  SOLN COMPARISON:  04/08/2014 FINDINGS: Lower chest: Unremarkable. Hepatobiliary: No focal  abnormality is seen in the liver. There is no dilation of bile ducts. There is evidence of previous cholecystectomy. Pancreas: No focal abnormality is seen. Spleen: Unremarkable. Adrenals/Urinary Tract: Adrenals are not enlarged. There is no hydronephrosis. There are no renal or ureteral stones. Urinary bladder is unremarkable. Stomach/Bowel: Stomach is unremarkable. Small bowel loops are not dilated. Appendix is not dilated. There is no significant wall thickening in  colon. There is no pericolic stranding or fluid collection. Vascular/Lymphatic: Unremarkable. Reproductive: Uterus is not seen. Other: There is no ascites or pneumoperitoneum. Musculoskeletal: Degenerative changes are noted in the lumbar spine with disc space narrowing, bony spurs and facet hypertrophy. Spinal stenosis and encroachment of neural foramina is seen at multiple levels in the lumbar spine. IMPRESSION: There is no evidence intestinal obstruction or pneumoperitoneum. There is no hydronephrosis. Appendix is not dilated. Severe lumbar spondylosis with spinal stenosis and encroachment of neural foramina at multiple levels. Electronically Signed   By: Elmer Picker M.D.   On: 09/13/2021 10:07   ECHOCARDIOGRAM COMPLETE  Result Date: 09/13/2021    ECHOCARDIOGRAM REPORT   Patient Name:   Melissa [name] Date of Exam: 09/13/2021 Medical Rec #:  [id number]         Height:       68.0 in Accession #:    [id number]        Weight:       198.0 lb Date of Birth:  [birth year]         BSA:          2.035 m Patient Age:    70 years          BP:           149/69 mmHg Patient Gender: F                 HR:           74 bpm. Exam Location:  [facility] Procedure: 2D Echo, Cardiac Doppler and Color Doppler Indications:    Syncope  History:        Patient has prior history of Echocardiogram examinations, most                 recent 02/04/2019. Stroke, Arrythmias:Atrial Fibrillation,                 Signs/Symptoms:Syncope; Risk Factors:Hypertension.  Sonographer:    Wenda Low Referring Phys: [id number] [place]  1. Left ventricular ejection fraction, by estimation, is 60 to 65%. The left ventricle has normal function. The left ventricle has no regional wall motion abnormalities. There is mild asymmetric left ventricular hypertrophy of the basal-septal segment. Left ventricular diastolic parameters are consistent with Grade I diastolic dysfunction (impaired relaxation).  2. Right ventricular  systolic function is normal. The right ventricular size is normal. There is mildly elevated pulmonary artery systolic pressure. The estimated right ventricular systolic pressure is 92.4 mmHg.  3. The mitral valve is normal in structure. Trivial mitral valve regurgitation.  4. The aortic valve is tricuspid. There is mild thickening of the aortic valve. Aortic valve regurgitation is not visualized. Aortic valve sclerosis is present, with no evidence of aortic valve stenosis.  5. The inferior vena cava is normal in size with greater than 50% respiratory variability, suggesting right atrial pressure of 3 mmHg. Comparison(s): Compared to prior TTE in 01/2019, there is no significant change. FINDINGS  Left Ventricle: Left ventricular ejection fraction, by estimation, is 60 to 65%. The left ventricle has normal  function. The left ventricle has no regional wall motion abnormalities. The left ventricular internal cavity size was normal in size. There is  mild asymmetric left ventricular hypertrophy of the basal-septal segment. Left ventricular diastolic parameters are consistent with Grade I diastolic dysfunction (impaired relaxation). Right Ventricle: The right ventricular size is normal. No increase in right ventricular wall thickness. Right ventricular systolic function is normal. There is mildly elevated pulmonary artery systolic pressure. The tricuspid regurgitant velocity is 2.96  m/s, and with an assumed right atrial pressure of 3 mmHg, the estimated right ventricular systolic pressure is 19.6 mmHg. Left Atrium: Left atrial size was normal in size. Right Atrium: Right atrial size was normal in size. Pericardium: There is no evidence of pericardial effusion. Mitral Valve: The mitral valve is normal in structure. There is mild thickening of the mitral valve leaflet(s). There is mild calcification of the mitral valve leaflet(s). Mild mitral annular calcification. Trivial mitral valve regurgitation. MV peak gradient, 3.1  mmHg. The mean mitral valve gradient is 1.0 mmHg. Tricuspid Valve: The tricuspid valve is normal in structure. Tricuspid valve regurgitation is mild. Aortic Valve: The aortic valve is tricuspid. There is mild thickening of the aortic valve. Aortic valve regurgitation is not visualized. Aortic valve sclerosis is present, with no evidence of aortic valve stenosis. Aortic valve mean gradient measures 5.0  mmHg. Aortic valve peak gradient measures 10.8 mmHg. Aortic valve area, by VTI measures 2.37 cm. Pulmonic Valve: The pulmonic valve was normal in structure. Pulmonic valve regurgitation is trivial. Aorta: The aortic root is normal in size and structure. Venous: The inferior vena cava is normal in size with greater than 50% respiratory variability, suggesting right atrial pressure of 3 mmHg. IAS/Shunts: The atrial septum is grossly normal.  LEFT VENTRICLE PLAX 2D LVIDd:         4.60 cm   Diastology LVIDs:         2.20 cm   LV e' medial:    9.03 cm/s LV PW:         0.90 cm   LV E/e' medial:  7.9 LV IVS:        0.90 cm   LV e' lateral:   11.90 cm/s LVOT diam:     1.90 cm   LV E/e' lateral: 6.0 LV SV:         95 LV SV Index:   47 LVOT Area:     2.84 cm  RIGHT VENTRICLE RV Basal diam:  2.95 cm RV Mid diam:    2.30 cm RV S prime:     15.00 cm/s TAPSE (M-mode): 2.9 cm LEFT ATRIUM           Index        RIGHT ATRIUM           Index LA diam:      3.90 cm 1.92 cm/m   RA Area:     18.30 cm LA Vol (A2C): 43.5 ml 21.38 ml/m  RA Volume:   49.80 ml  24.47 ml/m LA Vol (A4C): 42.8 ml 21.03 ml/m  AORTIC VALVE                     PULMONIC VALVE AV Area (Vmax):    2.39 cm      PV Vmax:       0.83 m/s AV Area (Vmean):   2.31 cm      PV Peak grad:  2.7 mmHg AV Area (VTI):     2.37 cm  AV Vmax:           164.00 cm/s AV Vmean:          105.000 cm/s AV VTI:            0.399 m AV Peak Grad:      10.8 mmHg AV Mean Grad:      5.0 mmHg LVOT Vmax:         138.00 cm/s LVOT Vmean:        85.600 cm/s LVOT VTI:          0.334 m LVOT/AV VTI  ratio: 0.84  AORTA Ao Root diam: 3.00 cm MITRAL VALVE               TRICUSPID VALVE MV Area (PHT): 2.71 cm    TR Peak grad:   35.0 mmHg MV Area VTI:   3.46 cm    TR Vmax:        296.00 cm/s MV Peak grad:  3.1 mmHg MV Mean grad:  1.0 mmHg    SHUNTS MV Vmax:       0.88 m/s    Systemic VTI:  0.33 m MV Vmean:      53.8 cm/s   Systemic Diam: 1.90 cm MV Decel Time: 280 msec MV E velocity: 71.10 cm/s MV A velocity: 86.50 cm/s MV E/A ratio:  0.82 Gwyndolyn Kaufman MD Electronically signed by Gwyndolyn Kaufman MD Signature Date/Time: 09/13/2021/4:10:39 PM    Final      Labs:   Basic Metabolic Panel: Recent Labs  Lab 09/13/21 0846 09/14/21 0710  NA 124* 132*  K 3.0* 3.6  CL 85* 97*  CO2 27 27  GLUCOSE 112* 89  BUN 14 7*  CREATININE 0.94 0.82  CALCIUM 9.5 8.9  MG  --  1.6*   GFR Estimated Creatinine Clearance: 74.1 mL/min (by C-G formula based on SCr of 0.82 mg/dL). Liver Function Tests: Recent Labs  Lab 09/13/21 0846  AST 20  ALT 17  ALKPHOS 82  BILITOT 1.1  PROT 7.2  ALBUMIN 4.2   Recent Labs  Lab 09/13/21 0846  LIPASE 40   No results for input(s): AMMONIA in the last 168 hours. Coagulation profile No results for input(s): INR, PROTIME in the last 168 hours.  CBC: Recent Labs  Lab 09/13/21 0846 09/14/21 0710  WBC 7.8 8.3  NEUTROABS 5.9  --   HGB 13.6 12.2  HCT 37.0 35.5*  MCV 87.7 92.2  PLT 209 175   Cardiac Enzymes: No results for input(s): CKTOTAL, CKMB, CKMBINDEX, TROPONINI in the last 168 hours. BNP: Invalid input(s): POCBNP CBG: No results for input(s): GLUCAP in the last 168 hours. D-Dimer No results for input(s): DDIMER in the last 72 hours. Hgb A1c No results for input(s): HGBA1C in the last 72 hours. Lipid Profile No results for input(s): CHOL, HDL, LDLCALC, TRIG, CHOLHDL, LDLDIRECT in the last 72 hours. Thyroid function studies Recent Labs    09/13/21 0846  TSH 1.473   Anemia work up No results for input(s): VITAMINB12, FOLATE, FERRITIN,  TIBC, IRON, RETICCTPCT in the last 72 hours. Microbiology Recent Results (from the past 240 hour(s))  Resp Panel by RT-PCR (Flu A&B, Covid) Nasopharyngeal Swab     Status: None   Collection Time: 09/13/21  9:22 AM   Specimen: Nasopharyngeal Swab; Nasopharyngeal(NP) swabs in vial transport medium  Result Value Ref Range Status   SARS Coronavirus 2 by RT PCR NEGATIVE NEGATIVE Final    Comment: (NOTE) SARS-CoV-2 target nucleic acids are NOT  DETECTED.  The SARS-CoV-2 RNA is generally detectable in upper respiratory specimens during the acute phase of infection. The lowest concentration of SARS-CoV-2 viral copies this assay can detect is 138 copies/mL. A negative result does not preclude SARS-Cov-2 infection and should not be used as the sole basis for treatment or other patient management decisions. A negative result may occur with  improper specimen collection/handling, submission of specimen other than nasopharyngeal swab, presence of viral mutation(s) within the areas targeted by this assay, and inadequate number of viral copies(<138 copies/mL). A negative result must be combined with clinical observations, patient history, and epidemiological information. The expected result is Negative.  Fact Sheet for Patients:  EntrepreneurPulse.com.au  Fact Sheet for Healthcare Providers:  IncredibleEmployment.be  This test is no t yet approved or cleared by the [place] FDA and  has been authorized for detection and/or diagnosis of SARS-CoV-2 by FDA under an Emergency Use Authorization (EUA). This EUA will remain  in effect (meaning this test can be used) for the duration of the COVID-19 declaration under Section 564(b)(1) of the Act, 21 U.S.C.section 360bbb-3(b)(1), unless the authorization is terminated  or revoked sooner.       Influenza A by PCR NEGATIVE NEGATIVE Final   Influenza B by PCR NEGATIVE NEGATIVE Final    Comment: (NOTE) The Xpert  Xpress SARS-CoV-2/FLU/RSV plus assay is intended as an aid in the diagnosis of influenza from Nasopharyngeal swab specimens and should not be used as a sole basis for treatment. Nasal washings and aspirates are unacceptable for Xpert Xpress SARS-CoV-2/FLU/RSV testing.  Fact Sheet for Patients: EntrepreneurPulse.com.au  Fact Sheet for Healthcare Providers: IncredibleEmployment.be  This test is not yet approved or cleared by the [place] FDA and has been authorized for detection and/or diagnosis of SARS-CoV-2 by FDA under an Emergency Use Authorization (EUA). This EUA will remain in effect (meaning this test can be used) for the duration of the COVID-19 declaration under Section 564(b)(1) of the Act, 21 U.S.C. section 360bbb-3(b)(1), unless the authorization is terminated or revoked.  Performed at [facility], [address], [place], [place] [address]      Discharge Instructions:   Discharge Instructions     Diet - low sodium heart healthy   Complete by: As directed    Increase activity slowly   Complete by: As directed       Allergies as of 09/14/2021       Reactions   Chloromycetin [chloramphenicol] Nausea And Vomiting   Pt states this "makes her sick"        Medication List     TAKE these medications    acetaminophen 500 MG tablet Commonly known as: TYLENOL Take 500 mg by mouth every 6 (six) hours as needed (pain).   ALPRAZolam 0.25 MG tablet Commonly known as: XANAX Take 1 tablet (0.25 mg total) by mouth 3 (three) times daily as needed for anxiety or sleep.   amLODipine 5 MG tablet Commonly known as: NORVASC Take 7.5 mg by mouth daily. Total daily dose=7.5 mg What changed: Another medication with the same name was removed. Continue taking this medication, and follow the directions you see here.   flecainide 50 MG tablet Commonly known as: TAMBOCOR Take 1 tablet (50 mg total) by mouth 2 (two) times daily.    metoprolol succinate 25 MG 24 hr tablet Commonly known as: Toprol XL Take 0.5 tablets (12.5 mg total) by mouth at bedtime.   multivitamin with minerals tablet Take 1 tablet by mouth daily.  olmesartan 40 MG tablet Commonly known as: BENICAR Take 40 mg by mouth every evening.   rivaroxaban 20 MG Tabs tablet Commonly known as: XARELTO Take 1 tablet (20 mg total) by mouth daily with supper.          Time coordinating discharge: 35 min  Signed:  Geradine Girt DO  [facility] 09/14/2021, 12:22 PM

## 2021-09-14 NOTE — Progress Notes (Signed)
Orthostatic vital signs completed, pt tolerated well. Pt them ambulated to bathroom with standby assist only, tolerated well. Denies any SOB, light headedness or pain.

## 2021-09-14 NOTE — Progress Notes (Signed)
Pt discharged via WC to POV by staff.

## 2021-09-14 NOTE — Progress Notes (Signed)
Progress Note  Patient Name: Melissa [name] Date of Encounter: 09/14/2021  Primary Cardiologist: Dr. Allegra Lai  Subjective   Feels better today overall.  No palpitations or lightheadedness.  Inpatient Medications    Scheduled Meds:  amLODipine  7.5 mg Oral Daily   flecainide  50 mg Oral BID   irbesartan  300 mg Oral Daily   metoprolol succinate  12.5 mg Oral QHS   multivitamin with minerals  1 tablet Oral Daily   rivaroxaban  20 mg Oral Q supper   Continuous Infusions:  sodium chloride 100 mL/hr at 09/14/21 0248   PRN Meds: acetaminophen, ALPRAZolam, loperamide, ondansetron **OR** ondansetron (ZOFRAN) IV   Vital Signs    Vitals:   09/13/21 2008 09/13/21 2359 09/14/21 0449 09/14/21 0833  BP: (!) 182/83 (!) 157/70 (!) 169/73   Pulse: 81 60 (!) 58   Resp: 19 19 19    Temp: 98.2 F (36.8 C) (!) 97.2 F (36.2 C) (!) 97 F (36.1 C)   TempSrc:      SpO2: 97% 97% 97%   Weight:    90.6 kg  Height:        Intake/Output Summary (Last 24 hours) at 09/14/2021 0945 Last data filed at 09/14/2021 0900 Gross per 24 hour  Intake 2650.16 ml  Output 1800 ml  Net 850.16 ml   Filed Weights   09/13/21 0827 09/14/21 0833  Weight: 89.8 kg 90.6 kg    Telemetry    Sinus rhythm with lead motion artifact, no VT or atrial fibrillation.  Personally reviewed.  ECG    An ECG dated 09/13/2021 was personally reviewed today and demonstrated:  Sinus rhythm with low voltage, prolonged PR interval, QRS duration 127 ms.  Physical Exam   GEN: No acute distress.   Neck: No JVD. Cardiac: RRR, no murmur, rub, or gallop.  Respiratory: Nonlabored. Clear to auscultation bilaterally. GI: Soft, nontender, bowel sounds present. MS: No edema; No deformity.  Labs    Chemistry Recent Labs  Lab 09/13/21 0846 09/14/21 0710  NA 124* 132*  K 3.0* 3.6  CL 85* 97*  CO2 27 27  GLUCOSE 112* 89  BUN 14 7*  CREATININE 0.94 0.82  CALCIUM 9.5 8.9  PROT 7.2  --   ALBUMIN 4.2  --    AST 20  --   ALT 17  --   ALKPHOS 82  --   BILITOT 1.1  --   GFRNONAA >60 >60  ANIONGAP 12 8     Hematology Recent Labs  Lab 09/13/21 0846 09/14/21 0710  WBC 7.8 8.3  RBC 4.22 3.85*  HGB 13.6 12.2  HCT 37.0 35.5*  MCV 87.7 92.2  MCH 32.2 31.7  MCHC 36.8* 34.4  RDW 12.1 12.6  PLT 209 175    Radiology    CT ABDOMEN PELVIS W CONTRAST  Result Date: 09/13/2021 CLINICAL DATA:  Abdominal pain nausea, diarrhea EXAM: CT ABDOMEN AND PELVIS WITH CONTRAST TECHNIQUE: Multidetector CT imaging of the abdomen and pelvis was performed using the standard protocol following bolus administration of intravenous contrast. CONTRAST:  152mL OMNIPAQUE IOHEXOL 300 MG/ML  SOLN COMPARISON:  04/08/2014 FINDINGS: Lower chest: Unremarkable. Hepatobiliary: No focal abnormality is seen in the liver. There is no dilation of bile ducts. There is evidence of previous cholecystectomy. Pancreas: No focal abnormality is seen. Spleen: Unremarkable. Adrenals/Urinary Tract: Adrenals are not enlarged. There is no hydronephrosis. There are no renal or ureteral stones. Urinary bladder is unremarkable. Stomach/Bowel: Stomach is unremarkable. Small bowel loops are  not dilated. Appendix is not dilated. There is no significant wall thickening in colon. There is no pericolic stranding or fluid collection. Vascular/Lymphatic: Unremarkable. Reproductive: Uterus is not seen. Other: There is no ascites or pneumoperitoneum. Musculoskeletal: Degenerative changes are noted in the lumbar spine with disc space narrowing, bony spurs and facet hypertrophy. Spinal stenosis and encroachment of neural foramina is seen at multiple levels in the lumbar spine. IMPRESSION: There is no evidence intestinal obstruction or pneumoperitoneum. There is no hydronephrosis. Appendix is not dilated. Severe lumbar spondylosis with spinal stenosis and encroachment of neural foramina at multiple levels. Electronically Signed   By: Elmer Picker M.D.   On:  09/13/2021 10:07   ECHOCARDIOGRAM COMPLETE  Result Date: 09/13/2021    ECHOCARDIOGRAM REPORT   Patient Name:   Melissa [name] Date of Exam: 09/13/2021 Medical Rec #:  [id number]         Height:       68.0 in Accession #:    [id number]        Weight:       198.0 lb Date of Birth:  1951/03/01         BSA:          2.035 m Patient Age:    27 years          BP:           149/69 mmHg Patient Gender: F                 HR:           74 bpm. Exam Location:  [facility] Procedure: 2D Echo, Cardiac Doppler and Color Doppler Indications:    Syncope  History:        Patient has prior history of Echocardiogram examinations, most                 recent 02/04/2019. Stroke, Arrythmias:Atrial Fibrillation,                 Signs/Symptoms:Syncope; Risk Factors:Hypertension.  Sonographer:    Wenda Low Referring Phys: [id number] [place]  1. Left ventricular ejection fraction, by estimation, is 60 to 65%. The left ventricle has normal function. The left ventricle has no regional wall motion abnormalities. There is mild asymmetric left ventricular hypertrophy of the basal-septal segment. Left ventricular diastolic parameters are consistent with Grade I diastolic dysfunction (impaired relaxation).  2. Right ventricular systolic function is normal. The right ventricular size is normal. There is mildly elevated pulmonary artery systolic pressure. The estimated right ventricular systolic pressure is 09.7 mmHg.  3. The mitral valve is normal in structure. Trivial mitral valve regurgitation.  4. The aortic valve is tricuspid. There is mild thickening of the aortic valve. Aortic valve regurgitation is not visualized. Aortic valve sclerosis is present, with no evidence of aortic valve stenosis.  5. The inferior vena cava is normal in size with greater than 50% respiratory variability, suggesting right atrial pressure of 3 mmHg. Comparison(s): Compared to prior TTE in 01/2019, there is no significant change. FINDINGS   Left Ventricle: Left ventricular ejection fraction, by estimation, is 60 to 65%. The left ventricle has normal function. The left ventricle has no regional wall motion abnormalities. The left ventricular internal cavity size was normal in size. There is  mild asymmetric left ventricular hypertrophy of the basal-septal segment. Left ventricular diastolic parameters are consistent with Grade I diastolic dysfunction (impaired relaxation). Right Ventricle: The right ventricular size is normal. No increase in  right ventricular wall thickness. Right ventricular systolic function is normal. There is mildly elevated pulmonary artery systolic pressure. The tricuspid regurgitant velocity is 2.96  m/s, and with an assumed right atrial pressure of 3 mmHg, the estimated right ventricular systolic pressure is 94.7 mmHg. Left Atrium: Left atrial size was normal in size. Right Atrium: Right atrial size was normal in size. Pericardium: There is no evidence of pericardial effusion. Mitral Valve: The mitral valve is normal in structure. There is mild thickening of the mitral valve leaflet(s). There is mild calcification of the mitral valve leaflet(s). Mild mitral annular calcification. Trivial mitral valve regurgitation. MV peak gradient, 3.1 mmHg. The mean mitral valve gradient is 1.0 mmHg. Tricuspid Valve: The tricuspid valve is normal in structure. Tricuspid valve regurgitation is mild. Aortic Valve: The aortic valve is tricuspid. There is mild thickening of the aortic valve. Aortic valve regurgitation is not visualized. Aortic valve sclerosis is present, with no evidence of aortic valve stenosis. Aortic valve mean gradient measures 5.0  mmHg. Aortic valve peak gradient measures 10.8 mmHg. Aortic valve area, by VTI measures 2.37 cm. Pulmonic Valve: The pulmonic valve was normal in structure. Pulmonic valve regurgitation is trivial. Aorta: The aortic root is normal in size and structure. Venous: The inferior vena cava is normal in  size with greater than 50% respiratory variability, suggesting right atrial pressure of 3 mmHg. IAS/Shunts: The atrial septum is grossly normal.  LEFT VENTRICLE PLAX 2D LVIDd:         4.60 cm   Diastology LVIDs:         2.20 cm   LV e' medial:    9.03 cm/s LV PW:         0.90 cm   LV E/e' medial:  7.9 LV IVS:        0.90 cm   LV e' lateral:   11.90 cm/s LVOT diam:     1.90 cm   LV E/e' lateral: 6.0 LV SV:         95 LV SV Index:   47 LVOT Area:     2.84 cm  RIGHT VENTRICLE RV Basal diam:  2.95 cm RV Mid diam:    2.30 cm RV S prime:     15.00 cm/s TAPSE (M-mode): 2.9 cm LEFT ATRIUM           Index        RIGHT ATRIUM           Index LA diam:      3.90 cm 1.92 cm/m   RA Area:     18.30 cm LA Vol (A2C): 43.5 ml 21.38 ml/m  RA Volume:   49.80 ml  24.47 ml/m LA Vol (A4C): 42.8 ml 21.03 ml/m  AORTIC VALVE                     PULMONIC VALVE AV Area (Vmax):    2.39 cm      PV Vmax:       0.83 m/s AV Area (Vmean):   2.31 cm      PV Peak grad:  2.7 mmHg AV Area (VTI):     2.37 cm AV Vmax:           164.00 cm/s AV Vmean:          105.000 cm/s AV VTI:            0.399 m AV Peak Grad:      10.8 mmHg AV Mean Grad:  5.0 mmHg LVOT Vmax:         138.00 cm/s LVOT Vmean:        85.600 cm/s LVOT VTI:          0.334 m LVOT/AV VTI ratio: 0.84  AORTA Ao Root diam: 3.00 cm MITRAL VALVE               TRICUSPID VALVE MV Area (PHT): 2.71 cm    TR Peak grad:   35.0 mmHg MV Area VTI:   3.46 cm    TR Vmax:        296.00 cm/s MV Peak grad:  3.1 mmHg MV Mean grad:  1.0 mmHg    SHUNTS MV Vmax:       0.88 m/s    Systemic VTI:  0.33 m MV Vmean:      53.8 cm/s   Systemic Diam: 1.90 cm MV Decel Time: 280 msec MV E velocity: 71.10 cm/s MV A velocity: 86.50 cm/s MV E/A ratio:  0.82 Gwyndolyn Kaufman MD Electronically signed by Gwyndolyn Kaufman MD Signature Date/Time: 09/13/2021/4:10:39 PM    Final     Assessment & Plan    1.  Episode of apparent syncope, possibly vasovagal in etiology.  No arrhythmias noted by cardiac monitor (only  lead motion artifact), follow-up echocardiogram shows LVEF 60 to 65%, also normal RV contraction.  2.  Hyponatremia, sodium up to 132 from 124 with IV fluids, also off chlorthalidone.  Baseline work-up for SIADH per primary team.  3.  Hypokalemia, potassium up to 3.6 with repletion.  4.  Paroxysmal atrial fibrillation with CHA2DS2-VASc score of 5.  Flecainide dose recently reduced to 50 mg twice daily given prolonged PR interval and QRS duration.  Plan to follow-up ECG today.  No ventricular arrhythmias by telemetry.  Otherwise on Toprol-XL and maintaining sinus rhythm.  She is on Xarelto for stroke prophylaxis.  5.  Essential hypertension, on Avapro and Norvasc.  Chlorthalidone discontinued.  May need further up titration of therapy depending on blood pressure control.  Would have her ambulate today. Follow-up ECG, otherwise do not anticipate any further inpatient cardiac evaluation.  We can set up a follow-up for her in the office after discharge for further review of her heart rhythm control and cardiac regimen.  Depending on PR and QRS duration following recent flecainide adjustments, can consider elective outpatient GXT to exclude proarrhythmia, although this does not sound like etiology of her recent event.  Signed, Rozann Lesches, MD  09/14/2021, 9:45 AM

## 2021-09-18 DIAGNOSIS — I10 Essential (primary) hypertension: Secondary | ICD-10-CM | POA: Diagnosis not present

## 2021-09-18 DIAGNOSIS — E871 Hypo-osmolality and hyponatremia: Secondary | ICD-10-CM | POA: Diagnosis not present

## 2021-09-18 DIAGNOSIS — E876 Hypokalemia: Secondary | ICD-10-CM | POA: Diagnosis not present

## 2021-09-19 DIAGNOSIS — E871 Hypo-osmolality and hyponatremia: Secondary | ICD-10-CM | POA: Diagnosis not present

## 2021-09-19 DIAGNOSIS — R55 Syncope and collapse: Secondary | ICD-10-CM | POA: Diagnosis not present

## 2021-09-19 DIAGNOSIS — I10 Essential (primary) hypertension: Secondary | ICD-10-CM | POA: Diagnosis not present

## 2021-10-31 ENCOUNTER — Ambulatory Visit: Payer: PPO | Admitting: Cardiology

## 2021-11-07 DIAGNOSIS — I480 Paroxysmal atrial fibrillation: Secondary | ICD-10-CM | POA: Diagnosis not present

## 2021-11-07 DIAGNOSIS — I10 Essential (primary) hypertension: Secondary | ICD-10-CM | POA: Diagnosis not present

## 2021-11-16 NOTE — Progress Notes (Signed)
PCP:  Asencion Noble, MD Primary Cardiologist: None Electrophysiologist: Will Meredith Leeds, MD   Melissa [name] is a 71 y.o. female seen today for Will Meredith Leeds, MD for routine electrophysiology followup.  Since last being seen in our clinic the patient reports doing very well. She had a vasovagal syncope in December in the setting of diarrhea and dehydration, none since.  she denies chest pain, palpitations, dyspnea, PND, orthopnea, nausea, vomiting, dizziness, syncope, edema, weight gain, or early satiety.  Past Medical History:  Diagnosis Date   Atrial fibrillation ([place])    CVA (cerebral infarction) 11/2013   aphasia   Hypertension    Hyperthyroidism    remote   Uterine cancer [facility])    Past Surgical History:  Procedure Laterality Date   ABDOMINAL HYSTERECTOMY     ATRIAL FIBRILLATION ABLATION N/A 05/08/2019   Procedure: ATRIAL FIBRILLATION ABLATION;  Surgeon: Constance Haw, MD;  Location: [place] [facility];  Service: Cardiovascular;  Laterality: N/A;   CHOLECYSTECTOMY     COLONOSCOPY N/A 05/12/2014   Procedure: COLONOSCOPY;  Surgeon: Daneil Dolin, MD;  [facility];  Service: Endoscopy;  Laterality: N/A;  1130   COLONOSCOPY N/A 07/26/2017   Procedure: COLONOSCOPY;  Surgeon: Daneil Dolin, MD;  [facility];  Service: Endoscopy;  Laterality: N/A;  1:00 pm   REPLACEMENT TOTAL KNEE BILATERAL     bilateral knees replaced twice    Current Outpatient Medications  Medication Sig Dispense Refill   acetaminophen (TYLENOL) 500 MG tablet Take 500 mg by mouth every 6 (six) hours as needed (pain).      ALPRAZolam (XANAX) 0.25 MG tablet Take 1 tablet (0.25 mg total) by mouth 3 (three) times daily as needed for anxiety or sleep. 10 tablet 0   amLODipine (NORVASC) 5 MG tablet Take 7.5 mg by mouth daily. Total daily dose=7.5 mg     flecainide (TAMBOCOR) 50 MG tablet Take 1 tablet (50 mg total) by mouth 2 (two) times daily. 180 tablet 3   metoprolol  succinate (TOPROL XL) 25 MG 24 hr tablet Take 0.5 tablets (12.5 mg total) by mouth at bedtime. 45 tablet 3   Multiple Vitamins-Minerals (MULTIVITAMIN WITH MINERALS) tablet Take 1 tablet by mouth daily.     olmesartan (BENICAR) 40 MG tablet Take 40 mg by mouth every evening.      rivaroxaban (XARELTO) 20 MG TABS tablet Take 1 tablet (20 mg total) by mouth daily with supper. 30 tablet 0   No current facility-administered medications for this visit.    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   Patient drinks  soda and coffee diet.   Social Determinants of Health   Financial Resource Strain: Not on file  Food Insecurity: Not on file  Transportation Needs: Not on file  Physical Activity: Not on file  Stress: Not on file  Social Connections: Not on file  Intimate Partner Violence: Not on file     Review of Systems: All other systems reviewed and are otherwise negative except as noted above.  Physical Exam: There were no vitals filed for this visit.  GEN- The patient is well appearing, alert and oriented x 3 today.   HEENT: normocephalic, atraumatic; sclera clear, conjunctiva pink; hearing intact; oropharynx clear; neck supple, no JVP Lymph- no cervical  lymphadenopathy Lungs- Clear to ausculation bilaterally, normal work of breathing.  No wheezes, rales, rhonchi Heart- Regular rate and rhythm, no murmurs, rubs or gallops, PMI not laterally displaced GI- soft, non-tender, non-distended, bowel sounds present, no hepatosplenomegaly Extremities- no clubbing, cyanosis, or edema; DP/PT/radial pulses 2+ bilaterally MS- no significant deformity or atrophy Skin- warm and dry, no rash or lesion Psych- euthymic mood, full affect Neuro- strength and sensation are intact  EKG is ordered. Personal review of EKG from today shows NSR at 73 bpm, PR and QRS have both improved on lower dose of flecainide  Additional studies reviewed include: Previous EP office notes.   Assessment and Plan:  1.  Paroxysmal atrial fibrillation:  Currently on Xarelto, metoprolol, flecainide.  Status post AF ablation 05/08/2019.   CHA2DS2-VASc of 5 EKG today shows NSR with improved intervals on lower dose flecainide  Previously had lengthening of both QRS and PR interval on higher doses of flecainide. EF 01/2019 60-65%.  Could also consider tikosyn in the future if needs to come off flecainide all together.     2. H/o  CVA:  Occurred in 2015 without residual side effects. No change   3.  HTN Stable on current regimen   Follow up with Dr. Curt Bears in 6 months   Shirley Friar, PA-C  11/16/21 9:34 PM

## 2021-11-17 ENCOUNTER — Encounter: Payer: Self-pay | Admitting: Student

## 2021-11-17 ENCOUNTER — Ambulatory Visit: Payer: PPO | Admitting: Student

## 2021-11-17 ENCOUNTER — Other Ambulatory Visit: Payer: Self-pay

## 2021-11-17 VITALS — BP 174/80 | HR 73 | Ht 68.0 in | Wt 193.0 lb

## 2021-11-17 DIAGNOSIS — I480 Paroxysmal atrial fibrillation: Secondary | ICD-10-CM

## 2021-11-17 DIAGNOSIS — I619 Nontraumatic intracerebral hemorrhage, unspecified: Secondary | ICD-10-CM | POA: Diagnosis not present

## 2021-11-17 DIAGNOSIS — I10 Essential (primary) hypertension: Secondary | ICD-10-CM

## 2021-11-17 LAB — BASIC METABOLIC PANEL
BUN/Creatinine Ratio: 14 (ref 12–28)
BUN: 13 mg/dL (ref 8–27)
CO2: 23 mmol/L (ref 20–29)
Calcium: 9.7 mg/dL (ref 8.7–10.3)
Chloride: 102 mmol/L (ref 96–106)
Creatinine, Ser: 0.93 mg/dL (ref 0.57–1.00)
Glucose: 104 mg/dL — ABNORMAL HIGH (ref 70–99)
Potassium: 4 mmol/L (ref 3.5–5.2)
Sodium: 140 mmol/L (ref 134–144)
eGFR: 66 mL/min/{1.73_m2} (ref >59)

## 2021-11-17 LAB — CBC
Hematocrit: 35.4 % (ref 34.0–46.6)
Hemoglobin: 12.3 g/dL (ref 11.1–15.9)
MCH: 32.5 pg (ref 26.6–33.0)
MCHC: 34.7 g/dL (ref 31.5–35.7)
MCV: 94 fL (ref 79–97)
Platelets: 179 10*3/uL (ref 150–450)
RBC: 3.78 x10E6/uL (ref 3.77–5.28)
RDW: 12.8 % (ref 11.7–15.4)
WBC: 6.3 10*3/uL (ref 3.4–10.8)

## 2021-11-17 NOTE — Patient Instructions (Signed)
Medication Instructions:  Your physician recommends that you continue on your current medications as directed. Please refer to the Current Medication list given to you today.  *If you need a refill on your cardiac medications before your next appointment, please call your pharmacy*   Lab Work: TODAY: BMET, CBC  If you have labs (blood work) drawn today and your tests are completely normal, you will receive your results only by: [place] (if you have MyChart) OR A paper copy in the mail If you have any lab test that is abnormal or we need to change your treatment, we will call you to review the results.   Follow-Up: At [facility], you and your health needs are our priority.  As part of our continuing mission to provide you with exceptional heart care, we have created designated Provider Care Teams.  These Care Teams include your primary Cardiologist (physician) and Advanced Practice Providers (APPs -  Physician Assistants and Nurse Practitioners) who all work together to provide you with the care you need, when you need it.   Your next appointment:   6 month(s)  The format for your next appointment:   In Person  Provider:   You may see Will Meredith Leeds, MD or one of the following Advanced Practice Providers on your designated Care Team:   Tommye Standard, [place] Legrand Como "[facility]" [place], [place]

## 2022-01-09 ENCOUNTER — Ambulatory Visit (INDEPENDENT_AMBULATORY_CARE_PROVIDER_SITE_OTHER): Payer: PPO

## 2022-01-09 ENCOUNTER — Ambulatory Visit
Admission: EM | Admit: 2022-01-09 | Discharge: 2022-01-09 | Disposition: A | Discharge status: Home / Self Care | Payer: PPO | Attending: Urgent Care | Admitting: Urgent Care

## 2022-01-09 DIAGNOSIS — M79675 Pain in left toe(s): Secondary | ICD-10-CM | POA: Diagnosis not present

## 2022-01-09 DIAGNOSIS — Z23 Encounter for immunization: Secondary | ICD-10-CM

## 2022-01-09 DIAGNOSIS — S91102A Unspecified open wound of left great toe without damage to nail, initial encounter: Secondary | ICD-10-CM | POA: Diagnosis not present

## 2022-01-09 DIAGNOSIS — L089 Local infection of the skin and subcutaneous tissue, unspecified: Secondary | ICD-10-CM | POA: Diagnosis not present

## 2022-01-09 DIAGNOSIS — M7989 Other specified soft tissue disorders: Secondary | ICD-10-CM | POA: Diagnosis not present

## 2022-01-09 MED ORDER — DOXYCYCLINE HYCLATE 100 MG PO CAPS: 100.0000 mg | ORAL_CAPSULE | Freq: Two times a day (BID) | ORAL | 0 refills | Status: DC

## 2022-01-09 MED ORDER — TETANUS-DIPHTH-ACELL PERTUSSIS 5-2.5-18.5 LF-MCG/0.5 IM SUSY
0.5000 mL | PREFILLED_SYRINGE | Freq: Once | INTRAMUSCULAR | Status: AC
  Administered 2022-01-09: 0.5 mL via INTRAMUSCULAR

## 2022-01-09 NOTE — Discharge Instructions (Signed)
Change your dressing 2-3 times daily. Every time you change your dressing, clean the wound gently with warm water and Dial antibacterial soap. Pat the wound dry, let it breathe for roughly an hour before covering it back up. When you reapply a dressing, use non-stick/non-adherent gauze. Apply Bacitracin ointment to the wound. After 4-7 days, the wound should scab over nicely and then you don't have to continue doing dressings. Start doxycycline for the infection. If you have no improvement in the next 2-3 days including fever, worsening pain, worsening redness, drainage of pus then please present to the emergency room.  ?

## 2022-01-09 NOTE — ED Provider Notes (Signed)
?[facility] ? ? ?MRN: [id number] DOB: [birth year] ? ?Subjective:  ? ?Melissa [name] is a 71 y.o. female presenting for 1 week history of persistent and worsening left great toe pain with redness and swelling.  Symptoms started after she stepped on an unknown object at her home over hardwood floor.  Denies foreign body sensation.  Has tried to do Epsom salt water baths.  Denies fever, drainage of pus, red streaking. ? ?No current facility-administered medications for this encounter. ? ?Current Outpatient Medications:  ?  acetaminophen (TYLENOL) 500 MG tablet, Take 500 mg by mouth every 6 (six) hours as needed (pain). , Disp: , Rfl:  ?  ALPRAZolam (XANAX) 0.25 MG tablet, Take 1 tablet (0.25 mg total) by mouth 3 (three) times daily as needed for anxiety or sleep., Disp: 10 tablet, Rfl: 0 ?  amLODipine (NORVASC) 5 MG tablet, Take 7.5 mg by mouth daily. Total daily dose=7.5 mg, Disp: , Rfl:  ?  flecainide (TAMBOCOR) 50 MG tablet, Take 1 tablet (50 mg total) by mouth 2 (two) times daily., Disp: 180 tablet, Rfl: 3 ?  hydrALAZINE (APRESOLINE) 10 MG tablet, Take 10 mg by mouth 2 (two) times daily., Disp: , Rfl:  ?  metoprolol succinate (TOPROL XL) 25 MG 24 hr tablet, Take 0.5 tablets (12.5 mg total) by mouth at bedtime., Disp: 45 tablet, Rfl: 3 ?  Multiple Vitamins-Minerals (MULTIVITAMIN WITH MINERALS) tablet, Take 1 tablet by mouth daily., Disp: , Rfl:  ?  olmesartan (BENICAR) 40 MG tablet, Take 40 mg by mouth every evening. , Disp: , Rfl:  ?  rivaroxaban (XARELTO) 20 MG TABS tablet, Take 1 tablet (20 mg total) by mouth daily with supper., Disp: 30 tablet, Rfl: 0  ? ?Allergies  ?Allergen Reactions  ? Chloromycetin [Chloramphenicol] Nausea And Vomiting  ?  Pt states this "makes her sick"  ? ? ?Past Medical History:  ?Diagnosis Date  ? Atrial fibrillation ([place])   ? CVA (cerebral infarction) 11/2013  ? aphasia  ? Hypertension   ? Hyperthyroidism   ? remote  ? Uterine cancer ([place])   ?  ? ?Past Surgical  History:  ?Procedure Laterality Date  ? ABDOMINAL HYSTERECTOMY    ? ATRIAL FIBRILLATION ABLATION N/A 05/08/2019  ? Procedure: ATRIAL FIBRILLATION ABLATION;  Surgeon: Constance Haw, MD;  Location: [place] [facility];  Service: Cardiovascular;  Laterality: N/A;  ? CHOLECYSTECTOMY    ? COLONOSCOPY N/A 05/12/2014  ? Procedure: COLONOSCOPY;  Surgeon: Daneil Dolin, MD;  [facility];  Service: Endoscopy;  Laterality: N/A;  1130  ? COLONOSCOPY N/A 07/26/2017  ? Procedure: COLONOSCOPY;  Surgeon: Daneil Dolin, MD;  [facility];  Service: Endoscopy;  Laterality: N/A;  1:00 pm  ? REPLACEMENT TOTAL KNEE BILATERAL    ? bilateral knees replaced twice  ? ? ?Family History  ?Problem Relation Age of Onset  ? CVA Maternal Grandfather 64  ? Colon cancer Neg Hx   ? ? ?Social History  ? ?Tobacco Use  ? Smoking status: Never  ? Smokeless tobacco: Never  ? Tobacco comments:  ?  Never smoked  ?Vaping Use  ? Vaping Use: Never used  ?Substance Use Topics  ? Alcohol use: No  ?  Alcohol/week: 0.0 standard drinks  ? Drug use: No  ? ? ?ROS ? ? ?Objective:  ? ?Vitals: ?BP (!) 195/92 (BP Location: Right Arm)   Pulse 93   Temp 98.8 ?F (37.1 ?C) (Oral)   Resp 18  SpO2 93%  ? ?Physical Exam ?Constitutional:   ?   General: She is not in acute distress. ?   Appearance: Normal appearance. She is well-developed. She is not ill-appearing, toxic-appearing or diaphoretic.  ?HENT:  ?   Head: Normocephalic and atraumatic.  ?   Nose: Nose normal.  ?   Mouth/Throat:  ?   Mouth: Mucous membranes are moist.  ?Eyes:  ?   General: No scleral icterus.    ?   Right eye: No discharge.     ?   Left eye: No discharge.  ?   Extraocular Movements: Extraocular movements intact.  ?Cardiovascular:  ?   Rate and Rhythm: Normal rate.  ?Pulmonary:  ?   Effort: Pulmonary effort is normal.  ?Musculoskeletal:  ?     Feet: ? ?Skin: ?   General: Skin is warm and dry.  ?Neurological:  ?   General: No focal deficit present.  ?   Mental Status: She  is alert and oriented to person, place, and time.  ?Psychiatric:     ?   Mood and Affect: Mood normal.     ?   Behavior: Behavior normal.  ? ? ?DG Toe Great Left ? ?Result Date: 01/09/2022 ?CLINICAL DATA:  pain, swelling EXAM: LEFT GREAT TOE COMPARISON:  None. FINDINGS: No evidence of fracture, dislocation, or joint effusion. No evidence of severe arthropathy. Mild first metatarsophalangeal joint degenerative changes. No aggressive appearing focal bone abnormality. Soft tissues are unremarkable. IMPRESSION: No acute displaced fracture or dislocation. Electronically Signed   By: Iven Finn M.D.   On: 01/09/2022 18:30   ? ? ? ? ? ?A dressing with bacitracin was applied. ? ?Assessment and Plan :  ? ?PDMP not reviewed this encounter. ? ?1. Infected wound   ?2. Need for diphtheria-tetanus-pertussis (Tdap) vaccine   ? ?No signs of deep tissue infection, osteomyelitis.  Discussed wound care.  Tdap updated.  Start doxycycline for the infected wound.  Maintain strict ER precautions. Counseled patient on potential for adverse effects with medications prescribed/recommended today, ER and return-to-clinic precautions discussed, patient verbalized understanding. ? ?  ?Jaynee Eagles, PA-C ?01/09/22 1846 ? ?

## 2022-01-09 NOTE — ED Triage Notes (Signed)
Pt reports pain, swelling and discoloration in the left big toe x 1 week. Pt think she may stepped over something when she was taking a shower.  ? ?Pt has an appt with her PCP on 01/11/22.  ?

## 2022-01-15 DIAGNOSIS — S91102A Unspecified open wound of left great toe without damage to nail, initial encounter: Secondary | ICD-10-CM | POA: Diagnosis not present

## 2022-01-29 DIAGNOSIS — Z79899 Other long term (current) drug therapy: Secondary | ICD-10-CM | POA: Diagnosis not present

## 2022-01-29 DIAGNOSIS — I10 Essential (primary) hypertension: Secondary | ICD-10-CM | POA: Diagnosis not present

## 2022-01-29 DIAGNOSIS — I480 Paroxysmal atrial fibrillation: Secondary | ICD-10-CM | POA: Diagnosis not present

## 2022-01-29 DIAGNOSIS — I618 Other nontraumatic intracerebral hemorrhage: Secondary | ICD-10-CM | POA: Diagnosis not present

## 2022-02-05 DIAGNOSIS — I480 Paroxysmal atrial fibrillation: Secondary | ICD-10-CM | POA: Diagnosis not present

## 2022-02-05 DIAGNOSIS — I10 Essential (primary) hypertension: Secondary | ICD-10-CM | POA: Diagnosis not present

## 2022-02-05 DIAGNOSIS — D6869 Other thrombophilia: Secondary | ICD-10-CM | POA: Diagnosis not present

## 2022-02-05 DIAGNOSIS — L03032 Cellulitis of left toe: Secondary | ICD-10-CM | POA: Diagnosis not present

## 2022-02-14 DIAGNOSIS — S91102A Unspecified open wound of left great toe without damage to nail, initial encounter: Secondary | ICD-10-CM | POA: Diagnosis not present

## 2022-02-19 ENCOUNTER — Ambulatory Visit: Payer: Medicare Other | Admitting: Podiatry

## 2022-02-19 ENCOUNTER — Ambulatory Visit (INDEPENDENT_AMBULATORY_CARE_PROVIDER_SITE_OTHER): Payer: PPO

## 2022-02-19 DIAGNOSIS — L97529 Non-pressure chronic ulcer of other part of left foot with unspecified severity: Secondary | ICD-10-CM

## 2022-02-19 DIAGNOSIS — L97522 Non-pressure chronic ulcer of other part of left foot with fat layer exposed: Secondary | ICD-10-CM

## 2022-02-19 NOTE — Progress Notes (Signed)
HPI: 71 y.o. female presenting today as a new patient with her husband, Joneen Caraway, for evaluation of an ulcer to the left great toe this been present for about a month.  Patient admits to walking around the house barefoot and she says that she stepped on something about a month ago.  She initially went to the urgent care and was prescribed doxycycline.  She states that most recently her family physician prescribed a different antibiotic.  She says there has been some improvement over the past few weeks.  She continues to go barefoot around the house  Past Medical History:  Diagnosis Date   Atrial fibrillation [facility])    CVA (cerebral infarction) 11/2013   aphasia   Hypertension    Hyperthyroidism    remote   Uterine cancer [facility])     Past Surgical History:  Procedure Laterality Date   ABDOMINAL HYSTERECTOMY     ATRIAL FIBRILLATION ABLATION N/A 05/08/2019   Procedure: ATRIAL FIBRILLATION ABLATION;  Surgeon: Constance Haw, MD;  Location: [place] CV LAB;  Service: Cardiovascular;  Laterality: N/A;   CHOLECYSTECTOMY     COLONOSCOPY N/A 05/12/2014   Procedure: COLONOSCOPY;  Surgeon: Daneil Dolin, MD;  [facility];  Service: Endoscopy;  Laterality: N/A;  1130   COLONOSCOPY N/A 07/26/2017   Procedure: COLONOSCOPY;  Surgeon: Daneil Dolin, MD;  [facility];  Service: Endoscopy;  Laterality: N/A;  1:00 pm   REPLACEMENT TOTAL KNEE BILATERAL     bilateral knees replaced twice    Allergies  Allergen Reactions   Chloromycetin [Chloramphenicol] Nausea And Vomiting    Pt states this "makes her sick"       Physical Exam: General: The patient is alert and oriented x3 in no acute distress.  Dermatology: Superficial ulcer noted to the plantar aspect of the left great toe that measures approximately 0.3 time 0.5 x 0.1 cm.  Granular wound base.  No drainage.  Periwound intact.  There is no exposed bone muscle tendon ligament or joint.  Vascular: Palpable pedal  pulses bilaterally. Capillary refill within normal limits.  Negative for any significant edema or erythema  Neurological: Light touch and protective threshold grossly intact  Musculoskeletal Exam: No pedal deformities noted  Radiographic Exam:  Normal osseous mineralization. Joint spaces preserved. No fracture/dislocation/boney destruction.  Nothing that would be concerning for osteomyelitis radiographically  Assessment: 1.  Ulcer left great toe   Plan of Care:  1. Patient evaluated. X-Rays reviewed.  2.  Medically necessary excisional debridement including subcutaneous tissue was performed using a 312 scalpel.  Excisional debridement of the necrotic nonviable tissue down to healthier bleeding viable tissue was performed with postdebridement measurement same as pre- 3.  Prescription for gentamicin cream applied daily with a Band-Aid 4.  We did discuss the possibility of the postsurgical shoe.  Overall the wound looks to be very superficial and healthy and the patient states that she will begin wearing good supportive tennis shoes/sneakers even around the house.  We will hold off on a postsurgical shoe for now 5.  Return to clinic in 3 weeks  *Husband's name is Lenise Herald, DPM [facility]  Dr. Edrick Kins, DPM    [address]  [place], [place] [address]                Office [phone]  Fax [phone]

## 2022-02-19 NOTE — Progress Notes (Signed)
g

## 2022-02-20 ENCOUNTER — Telehealth: Payer: Self-pay | Admitting: *Deleted

## 2022-02-20 NOTE — Telephone Encounter (Signed)
Patient is calling for status of a medication for her foot ulcer that was supposed to be sent to [facility], not there, please advise. Patient is calling again for the medication (gentamicin noted but not sent)that was supposed to be sent to pharmacy

## 2022-02-21 ENCOUNTER — Other Ambulatory Visit: Payer: Self-pay | Admitting: Podiatry

## 2022-02-21 MED ORDER — GENTAMICIN SULFATE 0.1 % EX CREA: 1.0000 "application " | TOPICAL_CREAM | Freq: Two times a day (BID) | CUTANEOUS | 1 refills | Status: DC

## 2022-02-21 NOTE — Telephone Encounter (Signed)
Rx sent to pharmacy. Sorry for the delay. Please notify patient. - Thanks, Dr. Amalia Hailey

## 2022-02-22 ENCOUNTER — Ambulatory Visit: Payer: PPO | Admitting: Cardiology

## 2022-02-22 NOTE — Telephone Encounter (Signed)
Patient has been notified

## 2022-03-14 ENCOUNTER — Ambulatory Visit: Payer: PPO | Admitting: Podiatry

## 2022-03-19 ENCOUNTER — Ambulatory Visit: Payer: PPO | Admitting: Podiatry

## 2022-03-19 DIAGNOSIS — L97522 Non-pressure chronic ulcer of other part of left foot with fat layer exposed: Secondary | ICD-10-CM | POA: Diagnosis not present

## 2022-03-19 NOTE — Progress Notes (Signed)
   HPI: 71 y.o. female presenting today with her husband, Joneen Caraway, f for follow-up evaluation of an ulcer to the left great toe.  They have been applying the gentamicin cream as instructed.  No new complaints at this time.  Past Medical History:  Diagnosis Date   Atrial fibrillation ([place])    CVA (cerebral infarction) 11/2013   aphasia   Hypertension    Hyperthyroidism    remote   Uterine cancer [facility])     Past Surgical History:  Procedure Laterality Date   ABDOMINAL HYSTERECTOMY     ATRIAL FIBRILLATION ABLATION N/A 05/08/2019   Procedure: ATRIAL FIBRILLATION ABLATION;  Surgeon: Constance Haw, MD;  Location: [place] CV LAB;  Service: Cardiovascular;  Laterality: N/A;   CHOLECYSTECTOMY     COLONOSCOPY N/A 05/12/2014   Procedure: COLONOSCOPY;  Surgeon: Daneil Dolin, MD;  [facility];  Service: Endoscopy;  Laterality: N/A;  1130   COLONOSCOPY N/A 07/26/2017   Procedure: COLONOSCOPY;  Surgeon: Daneil Dolin, MD;  [facility];  Service: Endoscopy;  Laterality: N/A;  1:00 pm   REPLACEMENT TOTAL KNEE BILATERAL     bilateral knees replaced twice    Allergies  Allergen Reactions   Chloromycetin [Chloramphenicol] Nausea And Vomiting    Pt states this "makes her sick"     Physical Exam: General: The patient is alert and oriented x3 in no acute distress.  Dermatology: Overall improvement of the wound.  Today it measures about 0.2 x 0.2 x 0.1 cm.  Granular wound base.  No drainage.  Periwound intact.  There is no exposed bone muscle tendon ligament or joint.  Vascular: Palpable pedal pulses bilaterally. Capillary refill within normal limits.  Negative for any significant edema or erythema  Neurological: Light touch and protective threshold grossly intact  Musculoskeletal Exam: No pedal deformities noted  Radiographic Exam LT foot 02/19/2022:  Normal osseous mineralization. Joint spaces preserved. No fracture/dislocation/boney destruction.  Nothing that  would be concerning for osteomyelitis radiographically  Assessment: 1.  Ulcer left great toe   Plan of Care:  1. Patient evaluated. X-Rays reviewed.  2.  Medically necessary excisional debridement including subcutaneous tissue was performed using a 312 scalpel.  Excisional debridement of the necrotic nonviable tissue down to healthier bleeding viable tissue was performed with postdebridement measurement same as pre- 3.  Continue gentamicin cream daily 4.  Patient continues to decline a postsurgical shoe.  Offloading felt dancers pads were applied to the insoles of the patient's current shoes to offload pressure from the great toe 5.  Return to clinic 4 weeks  *Husband's name is Lenise Herald, DPM [facility]  Dr. Edrick Kins, DPM    [address], [place] [address]                Office [phone]  Fax [phone]

## 2022-04-23 ENCOUNTER — Ambulatory Visit: Payer: PPO | Admitting: Podiatry

## 2022-04-23 DIAGNOSIS — L97522 Non-pressure chronic ulcer of other part of left foot with fat layer exposed: Secondary | ICD-10-CM | POA: Diagnosis not present

## 2022-04-23 NOTE — Progress Notes (Signed)
   Chief Complaint  Patient presents with   Follow-up    1 month for Ulcer of toe of left foot    HPI: 71 y.o. female presenting today with her husband, Joneen Caraway, for follow-up evaluation of an ulcer to the left great toe.  Patient believes the wound has healed.  She is doing very well.  The biggest change is that she is no longer going barefoot.  She is wearing good supportive cushioned sneakers.  She also wears the offloading dancers pad inside of the shoe.  They have been applying a dressing daily.  No new complaints at this time  Past Medical History:  Diagnosis Date   Atrial fibrillation ([place])    CVA (cerebral infarction) 11/2013   aphasia   Hypertension    Hyperthyroidism    remote   Uterine cancer [facility])     Past Surgical History:  Procedure Laterality Date   ABDOMINAL HYSTERECTOMY     ATRIAL FIBRILLATION ABLATION N/A 05/08/2019   Procedure: ATRIAL FIBRILLATION ABLATION;  Surgeon: Constance Haw, MD;  Location: [place] CV LAB;  Service: Cardiovascular;  Laterality: N/A;   CHOLECYSTECTOMY     COLONOSCOPY N/A 05/12/2014   Procedure: COLONOSCOPY;  Surgeon: Daneil Dolin, MD;  [facility];  Service: Endoscopy;  Laterality: N/A;  1130   COLONOSCOPY N/A 07/26/2017   Procedure: COLONOSCOPY;  Surgeon: Daneil Dolin, MD;  [facility];  Service: Endoscopy;  Laterality: N/A;  1:00 pm   REPLACEMENT TOTAL KNEE BILATERAL     bilateral knees replaced twice    Allergies  Allergen Reactions   Chloromycetin [Chloramphenicol] Nausea And Vomiting    Pt states this "makes her sick"      Physical Exam: General: The patient is alert and oriented x3 in no acute distress.  Dermatology: Overall improvement of the wound.  The wound appears very stable and basically healed.  There is a very small superficial eschar overlying the wound.  No drainage.  Today I am considering the wound completely healed and the superficial eschar should resolve on its  own  Vascular: Palpable pedal pulses bilaterally. Capillary refill within normal limits.  Negative for any significant edema or erythema  Neurological: Light touch and protective threshold grossly intact  Musculoskeletal Exam: No pedal deformities noted  Radiographic Exam LT foot 02/19/2022:  Normal osseous mineralization. Joint spaces preserved. No fracture/dislocation/boney destruction.  Nothing that would be concerning for osteomyelitis radiographically  Assessment: 1.  Ulcer left great toe; resolved   Plan of Care:  1. Patient evaluated. 2.  Continue wearing good supportive shoes and sneakers.  Advised against going barefoot. 3.  Continue offloading felt dancers pads 4.  Patient no longer needs to apply any dressing to the toe 5.  Return to clinic as needed  *Husband's name is Lenise Herald, DPM [facility]  Dr. Edrick Kins, DPM    [address] [place], [place] [address]                Office [phone]  Fax [phone]

## 2022-04-30 IMAGING — DX DG TOE GREAT 2+V*L*
3 series · 3 of 3 positions shown · non-contrast
Comparison: None.

CLINICAL DATA: pain, swelling

EXAM:
LEFT GREAT TOE

[toes ap (1 of 2)]
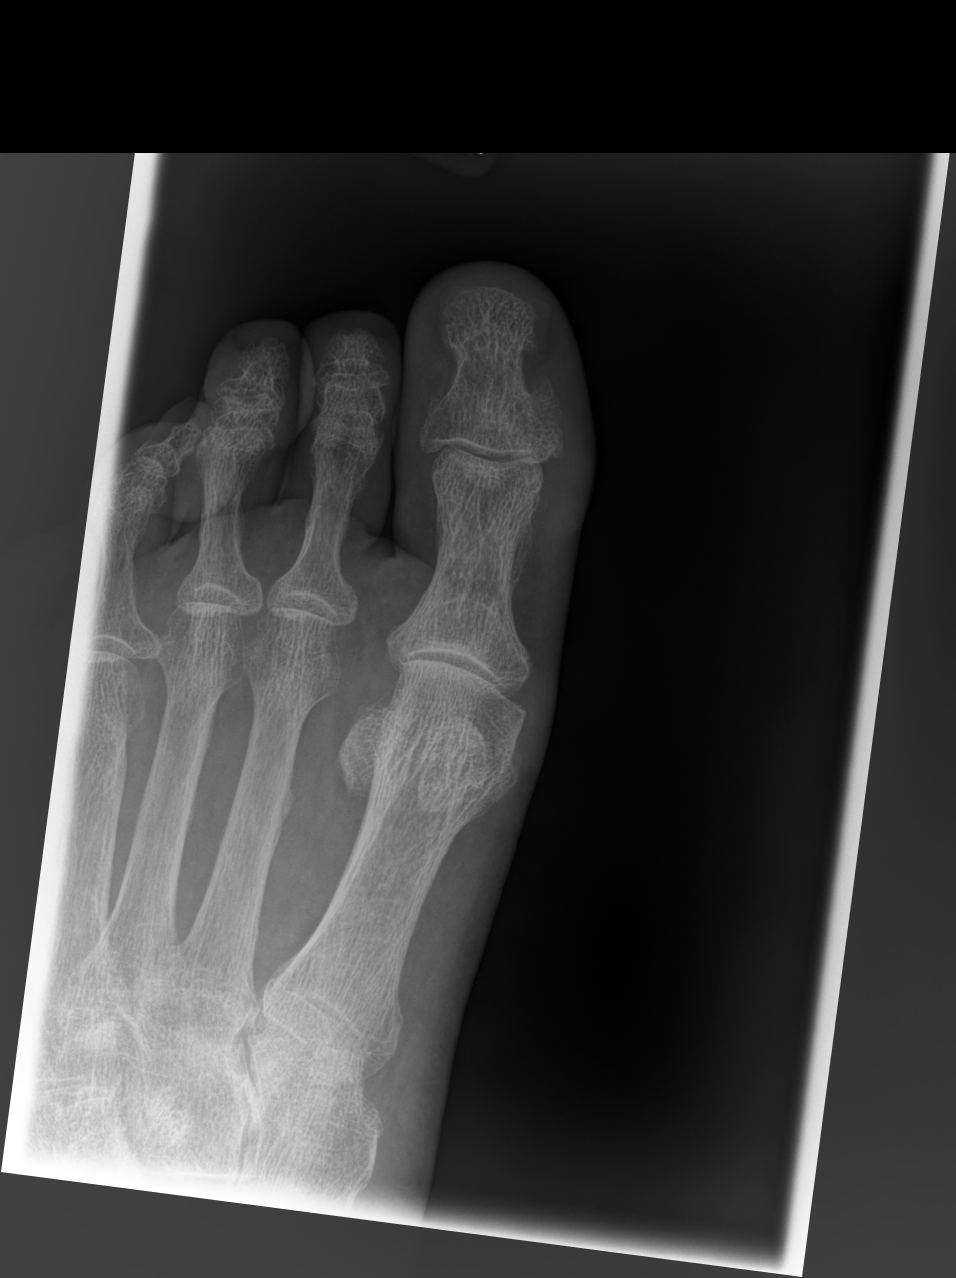

[toes lat]
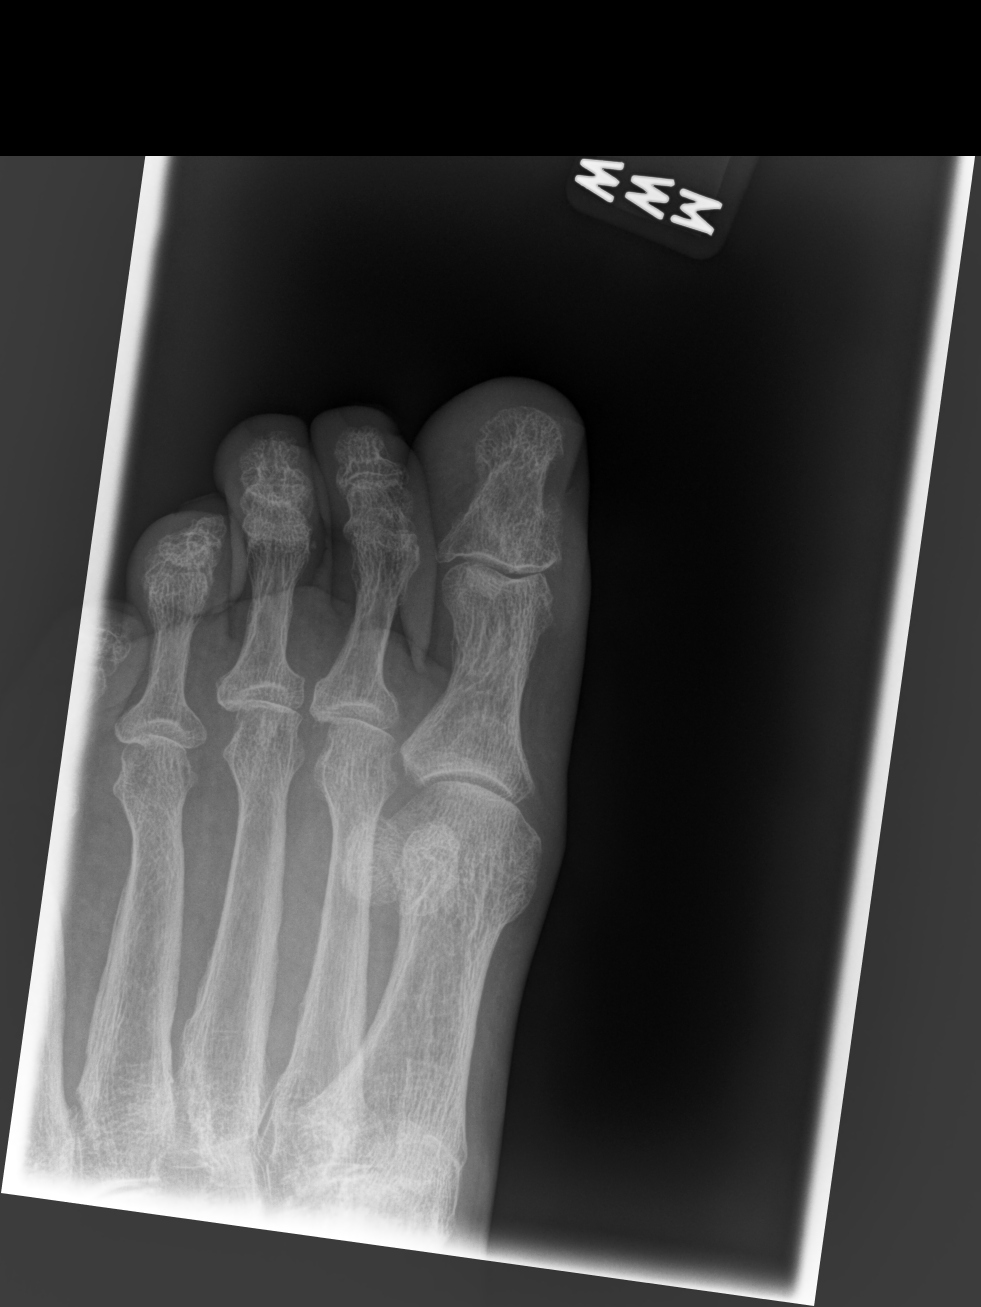

[toes ap (2 of 2)]
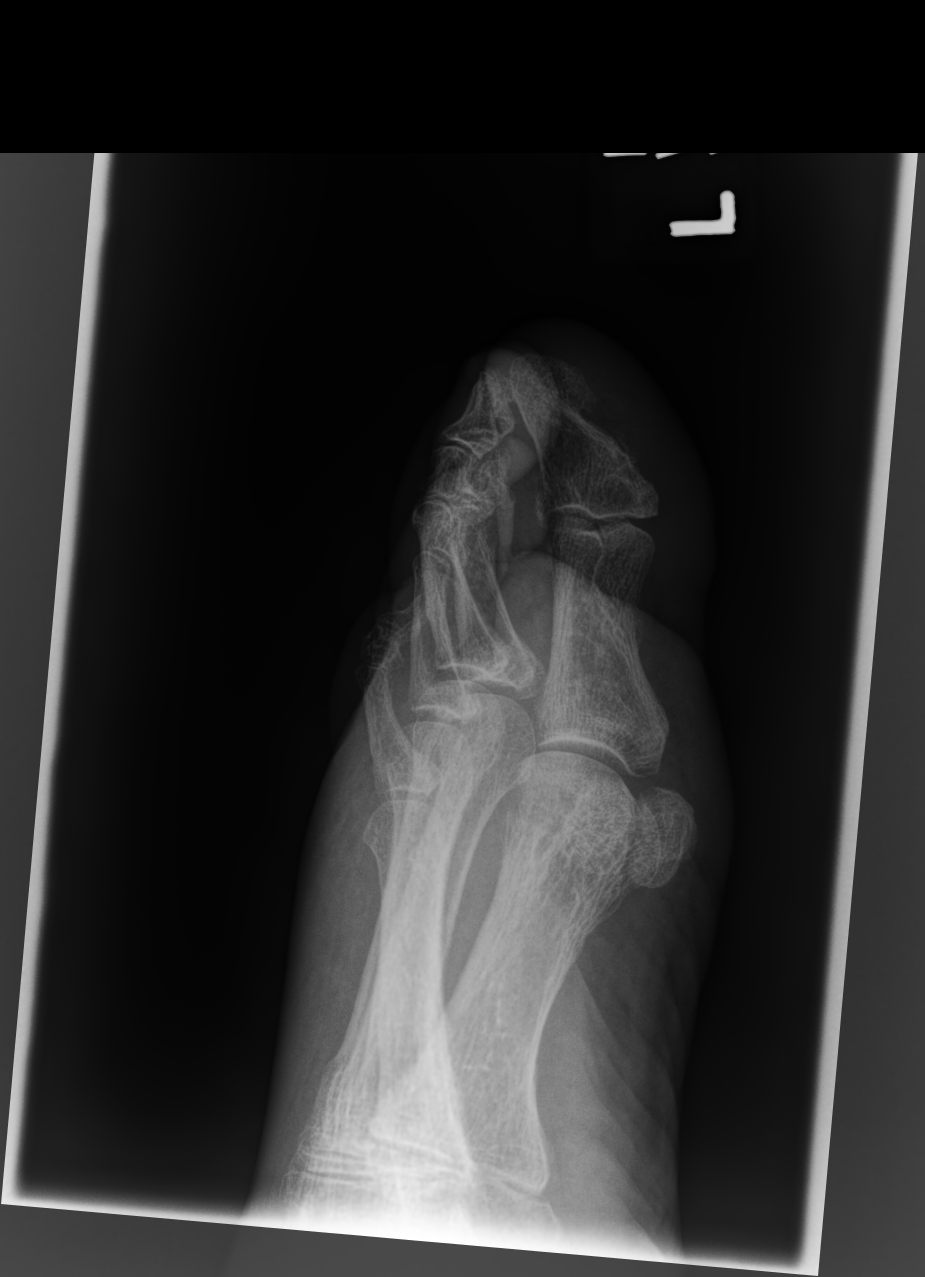

[3 of 3 positions shown; findings below may reference images not displayed]

FINDINGS: No evidence of fracture, dislocation, or joint effusion. No evidence
of severe arthropathy. Mild first metatarsophalangeal joint
degenerative changes. No aggressive appearing focal bone
abnormality. Soft tissues are unremarkable.
IMPRESSION: No acute displaced fracture or dislocation.

## 2022-05-16 ENCOUNTER — Ambulatory Visit: Payer: PPO | Admitting: Cardiology

## 2022-05-16 ENCOUNTER — Encounter: Payer: Self-pay | Admitting: Cardiology

## 2022-05-16 VITALS — BP 155/60 | HR 59 | Ht 68.0 in | Wt 185.6 lb

## 2022-05-16 DIAGNOSIS — I480 Paroxysmal atrial fibrillation: Secondary | ICD-10-CM

## 2022-05-16 DIAGNOSIS — D6869 Other thrombophilia: Secondary | ICD-10-CM

## 2022-05-16 MED ORDER — FLECAINIDE ACETATE 50 MG PO TABS: 50.0000 mg | ORAL_TABLET | Freq: Two times a day (BID) | ORAL | 2 refills | Status: DC

## 2022-05-16 MED ORDER — METOPROLOL SUCCINATE ER 25 MG PO TB24: 12.5000 mg | ORAL_TABLET | Freq: Every day | ORAL | 2 refills | Status: DC

## 2022-05-16 NOTE — Patient Instructions (Signed)
Medication Instructions:  Your physician recommends that you continue on your current medications as directed. Please refer to the Current Medication list given to you today.  *If you need a refill on your cardiac medications before your next appointment, please call your pharmacy*   Lab Work: None ordered   Testing/Procedures: None ordered   Follow-Up: At [facility], you and your health needs are our priority.  As part of our continuing mission to provide you with exceptional heart care, we have created designated Provider Care Teams.  These Care Teams include your primary Cardiologist (physician) and Advanced Practice Providers (APPs -  Physician Assistants and Nurse Practitioners) who all work together to provide you with the care you need, when you need it.   Your next appointment:   6 month(s)  The format for your next appointment:   In Person  Provider:   Legrand Como "Jonni Sanger" Chalmers Cater, PA-C    Thank you for choosing [facility]!!   Trinidad Curet, RN [phone]  Other Instructions   Important Information About Sugar

## 2022-05-16 NOTE — Progress Notes (Signed)
Electrophysiology Office Note   Date:  05/16/2022   ID:  Melissa [name], Melissa [name] Sep 03, 1951, MRN [id number]  PCP:  Melissa Noble, MD  Cardiologist:  Melissa [name] Primary Electrophysiologist:  Melissa Haw, MD    No chief complaint on file.    History of Present Illness: Melissa [name] is a 71 y.o. female who is being seen today for the evaluation of atrial fibrillation at the request of Melissa Noble, MD. Presenting today for electrophysiology evaluation.  He has a history of similar atrial fibrillation, CVA, hypertension.  She has had multiple episodes of atrial fibrillation.  She is post ablation 05/08/2019.  Today, denies symptoms of palpitations, chest pain, shortness of breath, orthopnea, PND, lower extremity edema, claudication, dizziness, presyncope, syncope, bleeding, or neurologic sequela. The patient is tolerating medications without difficulties.  Since being seen she has done well.  She has noted no further episodes of atrial fibrillation.  She is tolerating her flecainide and metoprolol without issue.  She is overall happy with her control.   Past Medical History:  Diagnosis Date   Atrial fibrillation ([place])    CVA (cerebral infarction) 11/2013   aphasia   Hypertension    Hyperthyroidism    remote   Uterine cancer [facility])    Past Surgical History:  Procedure Laterality Date   ABDOMINAL HYSTERECTOMY     ATRIAL FIBRILLATION ABLATION N/A 05/08/2019   Procedure: ATRIAL FIBRILLATION ABLATION;  Surgeon: Melissa Haw, MD;  Location: [place] [facility] LAB;  Service: Cardiovascular;  Laterality: N/A;   CHOLECYSTECTOMY     COLONOSCOPY N/A 05/12/2014   Procedure: COLONOSCOPY;  Surgeon: Melissa Dolin, MD;  [facility];  Service: Endoscopy;  Laterality: N/A;  1130   COLONOSCOPY N/A 07/26/2017   Procedure: COLONOSCOPY;  Surgeon: Melissa Dolin, MD;  [facility];  Service: Endoscopy;  Laterality: N/A;  1:00 pm   REPLACEMENT TOTAL KNEE BILATERAL      bilateral knees replaced twice     Current Outpatient Medications  Medication Sig Dispense Refill   acetaminophen (TYLENOL) 500 MG tablet Take 500 mg by mouth every 6 (six) hours as needed (pain).      ALPRAZolam (XANAX) 0.25 MG tablet Take 1 tablet (0.25 mg total) by mouth 3 (three) times daily as needed for anxiety or sleep. 10 tablet 0   amLODipine (NORVASC) 5 MG tablet Take 7.5 mg by mouth daily. Total daily dose=7.5 mg     hydrALAZINE (APRESOLINE) 10 MG tablet Take 10 mg by mouth 2 (two) times daily.     Multiple Vitamins-Minerals (MULTIVITAMIN WITH MINERALS) tablet Take 1 tablet by mouth daily.     olmesartan (BENICAR) 40 MG tablet Take 40 mg by mouth every evening.      rivaroxaban (XARELTO) 20 MG TABS tablet Take 1 tablet (20 mg total) by mouth daily with supper. 30 tablet 0   doxycycline (VIBRAMYCIN) 100 MG capsule Take 1 capsule (100 mg total) by mouth 2 (two) times daily. (Patient not taking: Reported on 04/23/2022) 20 capsule 0   flecainide (TAMBOCOR) 50 MG tablet Take 1 tablet (50 mg total) by mouth 2 (two) times daily. 180 tablet 2   gentamicin cream (GARAMYCIN) 0.1 % Apply 1 application. topically 2 (two) times daily. (Patient not taking: Reported on 05/16/2022) 30 g 1   metoprolol succinate (TOPROL XL) 25 MG 24 hr tablet Take 0.5 tablets (12.5 mg total) by mouth at bedtime. 45 tablet 2   No current facility-administered medications for this visit.  Allergies:   Chloromycetin [chloramphenicol]   Social History:  The patient  reports that she has never smoked. She has never used smokeless tobacco. She reports that she does not drink alcohol and does not use drugs.   Family History:  The patient's family history includes CVA (age of onset: 35) in her maternal grandfather.   ROS:  Please see the history of present illness.   Otherwise, review of systems is positive for none.   All other systems are reviewed and negative.   PHYSICAL EXAM: VS:  BP (!) 155/60   Pulse (!) 59    Ht '5\' 8"'$  (1.727 m)   Wt 185 lb 9.6 oz (84.2 kg)   SpO2 98%   BMI 28.22 kg/m  , BMI Body mass index is 28.22 kg/m. GEN: Well nourished, well developed, in no acute distress  HEENT: normal  Neck: no JVD, carotid bruits, or masses Cardiac: RRR; no murmurs, rubs, or gallops,no edema  Respiratory:  clear to auscultation bilaterally, normal work of breathing GI: soft, nontender, nondistended, + BS MS: no deformity or atrophy  Skin: warm and dry Neuro:  Strength and sensation are intact Psych: euthymic mood, full affect  EKG:  EKG is ordered today. Personal review of the ekg ordered shows sinus rhythm   Recent Labs: 09/13/2021: ALT 17; TSH 1.473 09/14/2021: Magnesium 1.6 11/17/2021: BUN 13; Creatinine, Ser 0.93; Hemoglobin 12.3; Platelets 179; Potassium 4.0; Sodium 140    Lipid Panel     Component Value Date/Time   CHOL 149 02/04/2019 0432   TRIG 35 02/04/2019 0432   HDL 71 02/04/2019 0432   CHOLHDL 2.1 02/04/2019 0432   VLDL 7 02/04/2019 0432   LDLCALC 71 02/04/2019 0432     Wt Readings from Last 3 Encounters:  05/16/22 185 lb 9.6 oz (84.2 kg)  11/17/21 193 lb (87.5 kg)  09/14/21 199 lb 11.8 oz (90.6 kg)      Other studies Reviewed: Additional studies/ records that were reviewed today include: TTE 09/13/21 Review of the above records today demonstrates:   1. Left ventricular ejection fraction, by estimation, is 60 to 65%. The  left ventricle has normal function. The left ventricle has no regional  wall motion abnormalities. There is mild asymmetric left ventricular  hypertrophy of the basal-septal segment.  Left ventricular diastolic parameters are consistent with Grade I  diastolic dysfunction (impaired relaxation).   2. Right ventricular systolic function is normal. The right ventricular  size is normal. There is mildly elevated pulmonary artery systolic  pressure. The estimated right ventricular systolic pressure is 29.7 mmHg.   3. The mitral valve is normal in  structure. Trivial mitral valve  regurgitation.   4. The aortic valve is tricuspid. There is mild thickening of the aortic  valve. Aortic valve regurgitation is not visualized. Aortic valve  sclerosis is present, with no evidence of aortic valve stenosis.   5. The inferior vena cava is normal in size with greater than 50%  respiratory variability, suggesting right atrial pressure of 3 mmHg.    ASSESSMENT AND PLAN:  1.  Paroxysmal atrial fibrillation: Currently on Xarelto 20 mg daily, flecainide 50 mg twice daily, metoprolol 12.5 mg daily.  CHA2DS2-VASc of 5.  High risk medication monitoring for flecainide.  Status post ablation 05/08/2019.  He is remained in sinus rhythm.  She is overall quite happy with her control.  No changes at this time.  2.  Cryptogenic stroke: Occurred in 2015 without residual side effects  3.  Hypertension: Blood  pressure is elevated today in clinic.  With recheck it has come down.  It is normal at home.  No changes.  Current medicines are reviewed at length with the patient today.   The patient does not have concerns regarding her medicines.  The following changes were made today: None  Labs/ tests ordered today include:  Orders Placed This Encounter  Procedures   EKG 12-Lead     Disposition:   FU 6 months  Signed, Woods Gangemi Meredith Leeds, MD  05/16/2022 9:37 AM     [place] [address] [phone] (office) [phone] (fax)

## 2022-05-23 DIAGNOSIS — I10 Essential (primary) hypertension: Secondary | ICD-10-CM | POA: Diagnosis not present

## 2022-05-23 DIAGNOSIS — I480 Paroxysmal atrial fibrillation: Secondary | ICD-10-CM | POA: Diagnosis not present

## 2022-06-05 DIAGNOSIS — H2513 Age-related nuclear cataract, bilateral: Secondary | ICD-10-CM | POA: Diagnosis not present

## 2022-06-05 DIAGNOSIS — H40033 Anatomical narrow angle, bilateral: Secondary | ICD-10-CM | POA: Diagnosis not present

## 2022-07-09 ENCOUNTER — Ambulatory Visit (INDEPENDENT_AMBULATORY_CARE_PROVIDER_SITE_OTHER): Payer: PPO

## 2022-07-09 ENCOUNTER — Ambulatory Visit: Payer: PPO | Admitting: Podiatry

## 2022-07-09 DIAGNOSIS — G609 Hereditary and idiopathic neuropathy, unspecified: Secondary | ICD-10-CM

## 2022-07-09 MED ORDER — GABAPENTIN 100 MG PO CAPS: 100.0000 mg | ORAL_CAPSULE | Freq: Three times a day (TID) | ORAL | 0 refills | Status: DC

## 2022-07-09 NOTE — Progress Notes (Signed)
   Chief Complaint  Patient presents with   Foot Pain    Patient is here for foot pain Bilateral an toes are curling downward, patient states that she has had the pain for 3-4 months.    HPI: 71 y.o. female presenting today for new complaint of pain and tenderness associated to numbness to the bilateral forefoot.  Patient states that she has been having pain to the bilateral forefoot for the last 4-6 months.  Idiopathic onset.  She denies any change of shoe gear or change in activity.  She states that she does have a history of some lumbar or back pain but has not had it evaluated.  Past Medical History:  Diagnosis Date   Atrial fibrillation ([place])    CVA (cerebral infarction) 11/2013   aphasia   Hypertension    Hyperthyroidism    remote   Uterine cancer [facility])     Past Surgical History:  Procedure Laterality Date   ABDOMINAL HYSTERECTOMY     ATRIAL FIBRILLATION ABLATION N/A 05/08/2019   Procedure: ATRIAL FIBRILLATION ABLATION;  Surgeon: Constance Haw, MD;  Location: [place] CV LAB;  Service: Cardiovascular;  Laterality: N/A;   CHOLECYSTECTOMY     COLONOSCOPY N/A 05/12/2014   Procedure: COLONOSCOPY;  Surgeon: Daneil Dolin, MD;  [facility];  Service: Endoscopy;  Laterality: N/A;  1130   COLONOSCOPY N/A 07/26/2017   Procedure: COLONOSCOPY;  Surgeon: Daneil Dolin, MD;  [facility];  Service: Endoscopy;  Laterality: N/A;  1:00 pm   REPLACEMENT TOTAL KNEE BILATERAL     bilateral knees replaced twice    Allergies  Allergen Reactions   Chloromycetin [Chloramphenicol] Nausea And Vomiting    Pt states this "makes her sick"     Physical Exam: General: The patient is alert and oriented x3 in no acute distress.  Dermatology: Skin is warm, dry and supple bilateral lower extremities. Negative for open lesions or macerations.  Vascular: Palpable pedal pulses bilaterally. Capillary refill within normal limits.  Negative for any significant edema or  erythema  Neurological: Light touch and protective threshold grossly intact  Musculoskeletal Exam: No pedal deformities noted  Radiographic exam B/L feet 07/09/2022: Normal osseous mineralization.  No fractures identified.  Joint spaces mostly preserved.  Otherwise normal exam  Assessment: 1.  Idiopathic bilateral forefoot neuropathy 2.  Possible lumbar radiculopathy   Plan of Care:  1. Patient evaluated. X-Rays reviewed.  2.  Prescription for gabapentin 100 mg 3 times daily #90 3.  Recommend wide fitting shoes that do not constrict the toebox area 4.  Recommend follow-up with PCP for referral to have her lumbar spine evaluated 5.  Return to clinic 1 month      Edrick Kins, DPM [facility]  Dr. Edrick Kins, DPM    [address], [place] [address]                Office [phone]  Fax [phone]

## 2022-08-13 ENCOUNTER — Ambulatory Visit: Payer: PPO | Admitting: Podiatry

## 2022-08-13 DIAGNOSIS — G609 Hereditary and idiopathic neuropathy, unspecified: Secondary | ICD-10-CM

## 2022-08-13 MED ORDER — GABAPENTIN 100 MG PO CAPS: 100.0000 mg | ORAL_CAPSULE | Freq: Three times a day (TID) | ORAL | 0 refills | Status: DC

## 2022-08-13 NOTE — Progress Notes (Signed)
   Chief Complaint  Patient presents with   Follow-up    Patient is here for bilateral follow-up for numbness, the patient states that the medication that was prescribed I not working.    HPI: 71 y.o. female presenting today f for follow-up evaluation of numbness to the bilateral feet that has been going on for about 6 months now.  Patient states that she does have a history of lumbar pathology.  She says the gabapentin has been helping.  Past Medical History:  Diagnosis Date   Atrial fibrillation ([place])    CVA (cerebral infarction) 11/2013   aphasia   Hypertension    Hyperthyroidism    remote   Uterine cancer [facility])     Past Surgical History:  Procedure Laterality Date   ABDOMINAL HYSTERECTOMY     ATRIAL FIBRILLATION ABLATION N/A 05/08/2019   Procedure: ATRIAL FIBRILLATION ABLATION;  Surgeon: Constance Haw, MD;  Location: [facility];  Service: Cardiovascular;  Laterality: N/A;   CHOLECYSTECTOMY     COLONOSCOPY N/A 05/12/2014   Procedure: COLONOSCOPY;  Surgeon: Daneil Dolin, MD;  [facility];  Service: Endoscopy;  Laterality: N/A;  1130   COLONOSCOPY N/A 07/26/2017   Procedure: COLONOSCOPY;  Surgeon: Daneil Dolin, MD;  [facility];  Service: Endoscopy;  Laterality: N/A;  1:00 pm   REPLACEMENT TOTAL KNEE BILATERAL     bilateral knees replaced twice    Allergies  Allergen Reactions   Chloromycetin [Chloramphenicol] Nausea And Vomiting    Pt states this "makes her sick"     Physical Exam: General: The patient is alert and oriented x3 in no acute distress.  Dermatology: Skin is warm, dry and supple bilateral lower extremities. Negative for open lesions or macerations.  Vascular: Palpable pedal pulses bilaterally. Capillary refill within normal limits.  Negative for any significant edema or erythema  Neurological: Light touch and protective threshold grossly intact  Musculoskeletal Exam: No pedal deformities noted  Radiographic exam  B/L feet 07/09/2022: Normal osseous mineralization.  No fractures identified.  Joint spaces mostly preserved.  Otherwise normal exam  Assessment: 1.  Idiopathic bilateral forefoot neuropathy 2.  Possible lumbar radiculopathy   Plan of Care:  1. Patient evaluated.  I do believe that the patient's neuropathic type symptoms and numbness may be coming from her lumbar spine.  She states that she does have a history of a ruptured disc but was never treated 2.  Continue gabapentin 100 mg 3 times daily #90.  Refill provided 3.  Recommend wide fitting shoes that do not constrict the toebox area 4.  Recommend follow-up with PCP for referral to have her lumbar spine evaluated.  Patient states that she has an appointment with PCP to discuss her lower back pain 5.  Return to clinic as needed      Edrick Kins, DPM [facility]  Dr. Edrick Kins, DPM    [address], [place] [address]                Office [phone]  Fax [phone]

## 2022-11-29 DIAGNOSIS — I480 Paroxysmal atrial fibrillation: Secondary | ICD-10-CM | POA: Diagnosis not present

## 2022-11-29 DIAGNOSIS — I10 Essential (primary) hypertension: Secondary | ICD-10-CM | POA: Diagnosis not present

## 2022-12-20 NOTE — Progress Notes (Signed)
  Electrophysiology Office Note:   Date:  12/21/2022  ID:  Melissa [name], Melissa [name] 1951/03/28, MRN [id number]  Primary Cardiologist: None Electrophysiologist: Will Meredith Leeds, MD   History of Present Illness:   Melissa [name] is a 72 y.o. female with h/o AF s/p ablation 05/2019, CVA, HTN, seen today for routine electrophysiology followup. Since last being seen in our clinic the patient reports doing very well. Denies lightheadedness, dizziness, syncope, or near syncope. She has not felt any breakthrough AF. Denies edema.   BP in the afternoons running upwards of 140 at home.   Review of systems complete and found to be negative unless listed in HPI.   AF History S/p AF ablation 05/2019 Has had flecainide lowered for QRS/PR interval  Studies Reviewed:    EKG is ordered today. Personal review shows sinus bradycardia at 53 bpm , PR interval 190 ms, 92 ms  Risk Assessment/Calculations:     HYPERTENSION CONTROL Vitals:   12/21/22 0924 12/21/22 0944  BP: (!) 144/68 (!) 146/88    The patient's blood pressure is elevated above target today.  In order to address the patient's elevated BP: A current anti-hypertensive medication was adjusted today.           Physical Exam:   VS:  BP (!) 146/88   Pulse (!) 53   Ht 5' 8.5" (1.74 m)   Wt 184 lb (83.5 kg)   SpO2 99%   BMI 27.57 kg/m    Wt Readings from Last 3 Encounters:  12/21/22 184 lb (83.5 kg)  05/16/22 185 lb 9.6 oz (84.2 kg)  11/17/21 193 lb (87.5 kg)     GEN: Well nourished, well developed in no acute distress NECK: No JVD; No carotid bruits CARDIAC: Regular rate and rhythm, no murmurs, rubs, gallops RESPIRATORY:  Clear to auscultation without rales, wheezing or rhonchi  ABDOMEN: Soft, non-tender, non-distended EXTREMITIES:  No edema; No deformity   ASSESSMENT AND PLAN:    PAF S/p ablation 05/2019 EKG today shows sinus bradycardia with stable intervals compared to 11/2021.   PR interval 05/2022 was much shorter.  Reviewed with Dr. Curt Bears who recommends continuing medication and follow up as is, since PR interval is overall stable to prior EKGs.  Continue Xarelto for CHA2DS2VASc  of at least 5 Continue flecainide 50 mg BID Echo 08/2021 LVEF 60-65% If pt has any episode lightheadedness/dizziness, instructed to call for monitoring.  She understands to call 911 for any syncope  H/o CVA Occurred in 2015 without residual side effects Continue OAC  HTN Elevated on arrival and recheck, >140 at home Increase hydralazine to 10 mg TID.  If remains elevated > 130 regularly at home, pt to call back for further adjustment     Follow up with Dr. Curt Bears in 6 months  Signed, Shirley Friar, PA-C

## 2022-12-21 ENCOUNTER — Encounter: Payer: Self-pay | Admitting: Student

## 2022-12-21 ENCOUNTER — Ambulatory Visit: Payer: PPO | Attending: Student | Admitting: Student

## 2022-12-21 VITALS — BP 146/88 | HR 53 | Ht 68.5 in | Wt 184.0 lb

## 2022-12-21 DIAGNOSIS — I10 Essential (primary) hypertension: Secondary | ICD-10-CM | POA: Diagnosis not present

## 2022-12-21 DIAGNOSIS — D6869 Other thrombophilia: Secondary | ICD-10-CM

## 2022-12-21 DIAGNOSIS — I480 Paroxysmal atrial fibrillation: Secondary | ICD-10-CM

## 2022-12-21 MED ORDER — FLECAINIDE ACETATE 50 MG PO TABS: 50.0000 mg | ORAL_TABLET | Freq: Two times a day (BID) | ORAL | 3 refills | Status: DC

## 2022-12-21 MED ORDER — METOPROLOL SUCCINATE ER 25 MG PO TB24: 12.5000 mg | ORAL_TABLET | Freq: Every day | ORAL | 3 refills | Status: DC

## 2022-12-21 MED ORDER — HYDRALAZINE HCL 10 MG PO TABS: 10.0000 mg | ORAL_TABLET | Freq: Three times a day (TID) | ORAL | 3 refills | Status: DC

## 2022-12-21 NOTE — Patient Instructions (Signed)
Medication Instructions:  1.Increase hydralazine to 10 mg, three times daily *If you need a refill on your cardiac medications before your next appointment, please call your pharmacy*  Lab Work: BMET, CBC today If you have labs (blood work) drawn today and your tests are completely normal, you will receive your results only by: [place] (if you have MyChart) OR A paper copy in the mail If you have any lab test that is abnormal or we need to change your treatment, we will call you to review the results.  Follow-Up: At [facility], you and your health needs are our priority.  As part of our continuing mission to provide you with exceptional heart care, we have created designated Provider Care Teams.  These Care Teams include your primary Cardiologist (physician) and Advanced Practice Providers (APPs -  Physician Assistants and Nurse Practitioners) who all work together to provide you with the care you need, when you need it.  Your next appointment:   6 month(s)  Provider:   Allegra Lai, MD

## 2022-12-22 LAB — BASIC METABOLIC PANEL
BUN/Creatinine Ratio: 17 (ref 12–28)
BUN: 16 mg/dL (ref 8–27)
CO2: 26 mmol/L (ref 20–29)
Calcium: 9.8 mg/dL (ref 8.7–10.3)
Chloride: 103 mmol/L (ref 96–106)
Creatinine, Ser: 0.95 mg/dL (ref 0.57–1.00)
Glucose: 76 mg/dL (ref 70–99)
Potassium: 4.1 mmol/L (ref 3.5–5.2)
Sodium: 143 mmol/L (ref 134–144)
eGFR: 64 mL/min/{1.73_m2} (ref >59)

## 2022-12-22 LAB — CBC
Hematocrit: 36.6 % (ref 34.0–46.6)
Hemoglobin: 12.3 g/dL (ref 11.1–15.9)
MCH: 32.1 pg (ref 26.6–33.0)
MCHC: 33.6 g/dL (ref 31.5–35.7)
MCV: 96 fL (ref 79–97)
Platelets: 157 10*3/uL (ref 150–450)
RBC: 3.83 x10E6/uL (ref 3.77–5.28)
RDW: 12.4 % (ref 11.7–15.4)
WBC: 5.2 10*3/uL (ref 3.4–10.8)

## 2023-02-27 DIAGNOSIS — Z79899 Other long term (current) drug therapy: Secondary | ICD-10-CM | POA: Diagnosis not present

## 2023-02-27 DIAGNOSIS — I480 Paroxysmal atrial fibrillation: Secondary | ICD-10-CM | POA: Diagnosis not present

## 2023-02-27 DIAGNOSIS — I10 Essential (primary) hypertension: Secondary | ICD-10-CM | POA: Diagnosis not present

## 2023-03-06 DIAGNOSIS — I480 Paroxysmal atrial fibrillation: Secondary | ICD-10-CM | POA: Diagnosis not present

## 2023-03-06 DIAGNOSIS — I10 Essential (primary) hypertension: Secondary | ICD-10-CM | POA: Diagnosis not present

## 2023-03-06 DIAGNOSIS — D696 Thrombocytopenia, unspecified: Secondary | ICD-10-CM | POA: Diagnosis not present

## 2023-06-04 DIAGNOSIS — I10 Essential (primary) hypertension: Secondary | ICD-10-CM | POA: Diagnosis not present

## 2023-06-04 DIAGNOSIS — I480 Paroxysmal atrial fibrillation: Secondary | ICD-10-CM | POA: Diagnosis not present

## 2023-06-04 DIAGNOSIS — M79671 Pain in right foot: Secondary | ICD-10-CM | POA: Diagnosis not present

## 2023-06-18 DIAGNOSIS — Z23 Encounter for immunization: Secondary | ICD-10-CM | POA: Diagnosis not present

## 2023-06-19 ENCOUNTER — Ambulatory Visit: Payer: PPO | Admitting: Podiatry

## 2023-06-26 ENCOUNTER — Ambulatory Visit: Payer: PPO | Admitting: Cardiology

## 2023-07-12 ENCOUNTER — Telehealth: Payer: Self-pay | Admitting: Cardiology

## 2023-07-12 ENCOUNTER — Other Ambulatory Visit: Payer: Self-pay | Admitting: *Deleted

## 2023-07-12 MED ORDER — FLECAINIDE ACETATE 50 MG PO TABS: 50.0000 mg | ORAL_TABLET | Freq: Two times a day (BID) | ORAL | 0 refills | Status: DC

## 2023-07-12 MED ORDER — METOPROLOL SUCCINATE ER 25 MG PO TB24: 12.5000 mg | ORAL_TABLET | Freq: Every day | ORAL | 0 refills | Status: DC

## 2023-07-12 MED ORDER — HYDRALAZINE HCL 10 MG PO TABS: 10.0000 mg | ORAL_TABLET | Freq: Three times a day (TID) | ORAL | 0 refills | Status: AC

## 2023-07-12 NOTE — Telephone Encounter (Signed)
 *  STAT* If patient is at the pharmacy, call can be transferred to refill team.   1. Which medications need to be refilled? (please list name of each medication and dose if known) flecainide (TAMBOCOR) 50 MG tablet   hydrALAZINE (APRESOLINE) 10 MG tablet   metoprolol succinate (TOPROL XL) 25 MG 24 hr tablet   2. Would you like to learn more about the convenience, safety, & potential cost savings by using the [facility]?    3. Are you open to using the [facility] (Type [facility].   4. Which pharmacy/location (including street and city if local pharmacy) is medication to be sent to? [facility] - [place], [place] - [address]    5. Do they need a 30 day or 90 day supply? 90 days

## 2023-07-18 ENCOUNTER — Ambulatory Visit: Payer: PPO | Admitting: Cardiology

## 2023-08-05 ENCOUNTER — Encounter (HOSPITAL_COMMUNITY): Payer: Self-pay

## 2023-08-05 ENCOUNTER — Other Ambulatory Visit: Payer: Self-pay

## 2023-08-05 ENCOUNTER — Observation Stay (HOSPITAL_COMMUNITY)
Admission: EM | Admit: 2023-08-05 | Discharge: 2023-08-06 | Disposition: A | Discharge status: Home / Self Care | Payer: PPO | Attending: Internal Medicine | Admitting: Internal Medicine

## 2023-08-05 ENCOUNTER — Emergency Department (HOSPITAL_COMMUNITY): Payer: PPO

## 2023-08-05 DIAGNOSIS — Z8639 Personal history of other endocrine, nutritional and metabolic disease: Secondary | ICD-10-CM

## 2023-08-05 DIAGNOSIS — Z8673 Personal history of transient ischemic attack (TIA), and cerebral infarction without residual deficits: Secondary | ICD-10-CM | POA: Diagnosis not present

## 2023-08-05 DIAGNOSIS — E876 Hypokalemia: Secondary | ICD-10-CM | POA: Diagnosis not present

## 2023-08-05 DIAGNOSIS — E039 Hypothyroidism, unspecified: Secondary | ICD-10-CM | POA: Insufficient documentation

## 2023-08-05 DIAGNOSIS — I10 Essential (primary) hypertension: Secondary | ICD-10-CM | POA: Diagnosis not present

## 2023-08-05 DIAGNOSIS — I700 Atherosclerosis of aorta: Secondary | ICD-10-CM | POA: Diagnosis not present

## 2023-08-05 DIAGNOSIS — F419 Anxiety disorder, unspecified: Secondary | ICD-10-CM | POA: Diagnosis not present

## 2023-08-05 DIAGNOSIS — I480 Paroxysmal atrial fibrillation: Principal | ICD-10-CM | POA: Insufficient documentation

## 2023-08-05 DIAGNOSIS — I4891 Unspecified atrial fibrillation: Secondary | ICD-10-CM | POA: Diagnosis not present

## 2023-08-05 DIAGNOSIS — R002 Palpitations: Secondary | ICD-10-CM | POA: Diagnosis present

## 2023-08-05 DIAGNOSIS — Z8542 Personal history of malignant neoplasm of other parts of uterus: Secondary | ICD-10-CM | POA: Diagnosis not present

## 2023-08-05 DIAGNOSIS — C539 Malignant neoplasm of cervix uteri, unspecified: Secondary | ICD-10-CM | POA: Diagnosis not present

## 2023-08-05 DIAGNOSIS — Z9889 Other specified postprocedural states: Secondary | ICD-10-CM | POA: Diagnosis not present

## 2023-08-05 DIAGNOSIS — Z96653 Presence of artificial knee joint, bilateral: Secondary | ICD-10-CM | POA: Insufficient documentation

## 2023-08-05 DIAGNOSIS — Z79899 Other long term (current) drug therapy: Secondary | ICD-10-CM | POA: Diagnosis not present

## 2023-08-05 LAB — TROPONIN I (HIGH SENSITIVITY)
Troponin I (High Sensitivity): 5 ng/L (ref <18)
Troponin I (High Sensitivity): 8 ng/L (ref <18)

## 2023-08-05 LAB — HEPATIC FUNCTION PANEL
ALT: 23 U/L (ref 0–44)
AST: 27 U/L (ref 15–41)
Albumin: 5 g/dL (ref 3.5–5.0)
Alkaline Phosphatase: 88 U/L (ref 38–126)
Bilirubin, Direct: 0.2 mg/dL (ref 0.0–0.2)
Indirect Bilirubin: 0.6 mg/dL (ref 0.3–0.9)
Total Bilirubin: 0.8 mg/dL (ref <1.2)
Total Protein: 8.1 g/dL (ref 6.5–8.1)

## 2023-08-05 LAB — BASIC METABOLIC PANEL
Anion gap: 11 (ref 5–15)
BUN: 13 mg/dL (ref 8–23)
CO2: 25 mmol/L (ref 22–32)
Calcium: 9.9 mg/dL (ref 8.9–10.3)
Chloride: 99 mmol/L (ref 98–111)
Creatinine, Ser: 0.74 mg/dL (ref 0.44–1.00)
GFR, Estimated: >60 mL/min (ref >60)
Glucose, Bld: 109 mg/dL — ABNORMAL HIGH (ref 70–99)
Potassium: 3.4 mmol/L — ABNORMAL LOW (ref 3.5–5.1)
Sodium: 135 mmol/L (ref 135–145)

## 2023-08-05 LAB — CBC
HCT: 40.8 % (ref 36.0–46.0)
Hemoglobin: 13.9 g/dL (ref 12.0–15.0)
MCH: 33 pg (ref 26.0–34.0)
MCHC: 34.1 g/dL (ref 30.0–36.0)
MCV: 96.9 fL (ref 80.0–100.0)
Platelets: 159 10*3/uL (ref 150–400)
RBC: 4.21 MIL/uL (ref 3.87–5.11)
RDW: 12.4 % (ref 11.5–15.5)
WBC: 6.3 10*3/uL (ref 4.0–10.5)
nRBC: 0 % (ref 0.0–0.2)

## 2023-08-05 MED ORDER — ACETAMINOPHEN 650 MG RE SUPP: 650.0000 mg | Freq: Four times a day (QID) | RECTAL | Status: DC | PRN

## 2023-08-05 MED ORDER — MORPHINE SULFATE (PF) 2 MG/ML IV SOLN: 2.0000 mg | INTRAVENOUS | Status: DC | PRN

## 2023-08-05 MED ORDER — METOPROLOL SUCCINATE ER 25 MG PO TB24
12.5000 mg | ORAL_TABLET | Freq: Every day | ORAL | Status: DC
  Administered 2023-08-05: 12.5 mg via ORAL
  Filled 2023-08-05: qty 1

## 2023-08-05 MED ORDER — ONDANSETRON HCL 4 MG/2ML IJ SOLN: 4.0000 mg | Freq: Four times a day (QID) | INTRAMUSCULAR | Status: DC | PRN

## 2023-08-05 MED ORDER — HYDRALAZINE HCL 10 MG PO TABS
10.0000 mg | ORAL_TABLET | Freq: Three times a day (TID) | ORAL | Status: DC
  Administered 2023-08-05 – 2023-08-06 (×2): 10 mg via ORAL
  Filled 2023-08-05 (×2): qty 1

## 2023-08-05 MED ORDER — IRBESARTAN 150 MG PO TABS
300.0000 mg | ORAL_TABLET | Freq: Every day | ORAL | Status: DC
  Administered 2023-08-06: 300 mg via ORAL
  Filled 2023-08-05: qty 2

## 2023-08-05 MED ORDER — RIVAROXABAN 20 MG PO TABS: 20.0000 mg | ORAL_TABLET | Freq: Every day | ORAL | Status: DC

## 2023-08-05 MED ORDER — ALPRAZOLAM 0.5 MG PO TABS
0.2500 mg | ORAL_TABLET | Freq: Three times a day (TID) | ORAL | Status: DC | PRN
  Administered 2023-08-05: 0.25 mg via ORAL
  Filled 2023-08-05: qty 1

## 2023-08-05 MED ORDER — POTASSIUM CHLORIDE CRYS ER 20 MEQ PO TBCR
20.0000 meq | EXTENDED_RELEASE_TABLET | Freq: Once | ORAL | Status: AC
  Administered 2023-08-05: 20 meq via ORAL
  Filled 2023-08-05: qty 1

## 2023-08-05 MED ORDER — ACETAMINOPHEN 325 MG PO TABS: 650.0000 mg | ORAL_TABLET | Freq: Four times a day (QID) | ORAL | Status: DC | PRN

## 2023-08-05 MED ORDER — AMLODIPINE BESYLATE 5 MG PO TABS
7.5000 mg | ORAL_TABLET | Freq: Every day | ORAL | Status: DC
Start: 2023-08-06 — End: 2023-08-06

## 2023-08-05 MED ORDER — CHLORHEXIDINE GLUCONATE CLOTH 2 % EX PADS
6.0000 | MEDICATED_PAD | Freq: Every day | CUTANEOUS | Status: DC
  Administered 2023-08-06: 6 via TOPICAL

## 2023-08-05 MED ORDER — DILTIAZEM HCL-DEXTROSE 125-5 MG/125ML-% IV SOLN (PREMIX)
5.0000 mg/h | INTRAVENOUS | Status: DC
  Administered 2023-08-05: 5 mg/h via INTRAVENOUS
  Filled 2023-08-05: qty 125

## 2023-08-05 MED ORDER — FLECAINIDE ACETATE 50 MG PO TABS
50.0000 mg | ORAL_TABLET | Freq: Two times a day (BID) | ORAL | Status: DC
  Administered 2023-08-05 – 2023-08-06 (×2): 50 mg via ORAL
  Filled 2023-08-05 (×4): qty 1

## 2023-08-05 MED ORDER — ONDANSETRON HCL 4 MG PO TABS: 4.0000 mg | ORAL_TABLET | Freq: Four times a day (QID) | ORAL | Status: DC | PRN

## 2023-08-05 MED ORDER — RIVAROXABAN 20 MG PO TABS
20.0000 mg | ORAL_TABLET | Freq: Once | ORAL | Status: AC
  Administered 2023-08-05: 20 mg via ORAL
  Filled 2023-08-05: qty 1

## 2023-08-05 MED ORDER — OXYCODONE HCL 5 MG PO TABS: 5.0000 mg | ORAL_TABLET | ORAL | Status: DC | PRN

## 2023-08-05 MED ORDER — DILTIAZEM HCL 25 MG/5ML IV SOLN
10.0000 mg | Freq: Once | INTRAVENOUS | Status: AC
Start: 2023-08-05 — End: 2023-08-05
  Administered 2023-08-05: 10 mg via INTRAVENOUS
  Filled 2023-08-05: qty 5

## 2023-08-05 NOTE — H&P (Signed)
History and Physical    Patient: Melissa [name]:[id number] DOB: 1951-09-26 DOA: 08/05/2023 DOS: the patient was seen and examined on 08/05/2023 PCP: Carylon Perches, MD  Patient coming from: Home  Chief Complaint:  Chief Complaint  Patient presents with   Irregular Heart Beat   HPI: Melissa [name] is a 72 y.o. female with medical history significant of paroxysmal atrial fibrillation, hypertension, hypothyroidism, uterine cancer, and more presents the ED with a chief complaint of heart racing.  Patient reports that she felt her heart start to race about an hour prior to presentation in the ED.  She had no dizziness or dyspnea.  She checked her heart rate at home and it was 150.  She reports she has never been told to take an extra flecainide if this happens, but she did take an extra flecainide.  It did not help.  Having palpitations makes her nervous so she came into the ED.  Patient reports that she has had ablation before.  That was 2 or 3 years ago.  She has continued flecainide, metoprolol, Xarelto.  Patient reports she has had a 42 pound weight loss in the last 3 or 4 months.  When she was first diagnosed with atrial fibrillation she had had a big weight loss as well.  She is not sure if that is related.  With a history of hyperthyroidism it could be related, no thyroidectomy listed in her surgical history.  Patient reports that she has had a good appetite, but when you really question her she says she eats about 1200 cal/day.  This could be the cause of her weight loss as well.  She is five 7-1/2, so 1200 cal is below her basal rate metabolism.  Patient reports the only other thing she felt recently was generalized weakness.  She associates that with her heart racing.  Patient says repeatedly during exam that she is feeling nervous, and she has not taken her Xanax recently.  Patient has no other complaints at this time.  Patient does not smoke and does not drink.  Patient reports that she  would not want to be DNR. Review of Systems: As mentioned in the history of present illness. All other systems reviewed and are negative. Past Medical History:  Diagnosis Date   Atrial fibrillation ([facility])    CVA (cerebral infarction) 11/2013   aphasia   Hypertension    Hyperthyroidism    remote   Uterine cancer [facility])    Past Surgical History:  Procedure Laterality Date   ABDOMINAL HYSTERECTOMY     ATRIAL FIBRILLATION ABLATION N/A 05/08/2019   Procedure: ATRIAL FIBRILLATION ABLATION;  Surgeon: Regan Lemming, MD;  Location: [facility];  Service: Cardiovascular;  Laterality: N/A;   CHOLECYSTECTOMY     COLONOSCOPY N/A 05/12/2014   Procedure: COLONOSCOPY;  Surgeon: Corbin Ade, MD;  [facility];  Service: Endoscopy;  Laterality: N/A;  1130   COLONOSCOPY N/A 07/26/2017   Procedure: COLONOSCOPY;  Surgeon: Corbin Ade, MD;  [facility];  Service: Endoscopy;  Laterality: N/A;  1:00 pm   REPLACEMENT TOTAL KNEE BILATERAL     bilateral knees replaced twice   Social History:  reports that she has never smoked. She has never used smokeless tobacco. She reports that she does not drink alcohol and does not use drugs.  Allergies  Allergen Reactions   Chloromycetin [Chloramphenicol] Nausea And Vomiting    Pt states this "makes her sick"    Family  History  Problem Relation Age of Onset   CVA Maternal Grandfather 21   Colon cancer Neg Hx     Prior to Admission medications   Medication Sig Start Date End Date Taking? Authorizing Provider  acetaminophen (TYLENOL) 500 MG tablet Take 500 mg by mouth every 6 (six) hours as needed (pain).     [provider]  ALPRAZolam Prudy Feeler) 0.25 MG tablet Take 1 tablet (0.25 mg total) by mouth 3 (three) times daily as needed for anxiety or sleep. 02/05/19   Sherryll Burger, Pratik D, DO  amLODipine (NORVASC) 5 MG tablet Take 7.5 mg by mouth daily. Total daily dose=7.5 mg    [provider]  flecainide (TAMBOCOR) 50  MG tablet Take 1 tablet (50 mg total) by mouth 2 (two) times daily. 07/12/23   Camnitz, Andree Coss, MD  hydrALAZINE (APRESOLINE) 10 MG tablet Take 1 tablet (10 mg total) by mouth 3 (three) times daily. 07/12/23   Camnitz, Andree Coss, MD  metoprolol succinate (TOPROL XL) 25 MG 24 hr tablet Take 0.5 tablets (12.5 mg total) by mouth at bedtime. Please keep upcoming appointment 11/24 with provider for future refills.Thank you 07/12/23   Regan Lemming, MD  Multiple Vitamins-Minerals (MULTIVITAMIN WITH MINERALS) tablet Take 1 tablet by mouth daily.    [provider]  olmesartan (BENICAR) 40 MG tablet Take 40 mg by mouth every evening.  12/01/18   [provider]  rivaroxaban (XARELTO) 20 MG TABS tablet Take 1 tablet (20 mg total) by mouth daily with supper. 02/01/19   Pauline Aus, PA-C    Physical Exam: Vitals:   08/05/23 2045 08/05/23 2115 08/05/23 2130 08/05/23 2230  BP: (!) 163/86 (!) 163/86 (!) 161/86 (!) 153/87  Pulse: 97 100 100 (!) 106  Resp: 16 (!) 21 13 17   Temp:      TempSrc:      SpO2: 98% 98% 98% 98%   1.  General: Patient lying supine in bed,  no acute distress   2. Psychiatric: Alert and oriented x 3, mood and behavior normal for situation, pleasant and cooperative with exam   3. Neurologic: Speech and language are normal, face is symmetric, moves all 4 extremities voluntarily, at baseline without acute deficits on limited exam   4. HEENMT:  Head is atraumatic, normocephalic, pupils reactive to light, neck is supple, trachea is midline, mucous membranes are moist   5. Respiratory : Lungs are clear to auscultation bilaterally without wheezing, rhonchi, rales, no cyanosis, no increase in work of breathing or accessory muscle use   6. Cardiovascular : Heart rate slightly tachycardic, rhythm is irregular, no murmurs, rubs or gallops, trace peripheral edema, peripheral pulses palpated   7. Gastrointestinal:  Abdomen is soft, nondistended, nontender  to palpation bowel sounds active, no masses or organomegaly palpated   8. Skin:  Skin is warm, dry and intact without rashes, acute lesions, or ulcers on limited exam   9.Musculoskeletal:  No acute deformities or trauma, no asymmetry in tone, trace peripheral edema, peripheral pulses palpated, no tenderness to palpation in the extremities  Data Reviewed: In the ED Patient is afebrile, heart rate is tachycardic, respiratory rate 16-20, blood pressure 158/80-192/90, satting 98-100% No leukocytosis, hemoglobin 13.9 Chemistry shows a mild hypokalemia at 3.4 and 20 mEq of potassium were given in the ED Chest x-ray shows no acute cardiopulmonary process EKG shows a flutter with a heart rate of 138 QTc 4 and 75 Patient was started on diltiazem drip Admission requested for A-fib with RVR  Trope 5  Assessment and Plan: * Atrial fibrillation with RVR (HCC) - Continue Xarelto, flecainide, metoprolol - Continue diltiazem drip - Consult cardiology -Monitor on telemetry  Anxiety - Continue Xanax  Essential hypertension - Continue ARB, metoprolol, Norvasc, hydralazine  History of hyperthyroidism - Check TSH      Advance Care Planning:   Code Status: Full Code  Consults: Cardiology  Family Communication: No family at bedside  Severity of Illness: The appropriate patient status for this patient is INPATIENT. Inpatient status is judged to be reasonable and necessary in order to provide the required intensity of service to ensure the patient's safety. The patient's presenting symptoms, physical exam findings, and initial radiographic and laboratory data in the context of their chronic comorbidities is felt to place them at high risk for further clinical deterioration. Furthermore, it is not anticipated that the patient will be medically stable for discharge from the hospital within 2 midnights of admission.   * I certify that at the point of admission it is my clinical judgment that the  patient will require inpatient hospital care spanning beyond 2 midnights from the point of admission due to high intensity of service, high risk for further deterioration and high frequency of surveillance required.*  Author: Lilyan Gilford, DO 08/05/2023 11:06 PM  For on call review [email or web address].

## 2023-08-05 NOTE — ED Triage Notes (Signed)
Pt c/o HR jumping into A-fib x1 hour ago with a pulse as high as 160. Pt HR now 151 in triage. Pt states that this is the first time that this has happened since surgery x2 years ago. Denies chest pain. Pt on medications and blood thinner.

## 2023-08-05 NOTE — Assessment & Plan Note (Addendum)
-   Continue Xarelto, flecainide, metoprolol - Continue diltiazem drip - Consult cardiology -Monitor on telemetry

## 2023-08-05 NOTE — Assessment & Plan Note (Signed)
-   Continue Xanax 

## 2023-08-05 NOTE — ED Provider Notes (Signed)
[place] EMERGENCY DEPARTMENT AT [facility] Provider Note   CSN: [id number] Arrival date & time: 08/05/23  1912     History {Add pertinent medical, surgical, social history, OB history to HPI:1} Chief Complaint  Patient presents with   Irregular Heart Beat    Melissa [name] is a 72 y.o. female.  Patient complains of palpitations.  She had a history of atrial fibs with an ablation done years ago.  She has not had atrial fibs since then.  Patient has a history of hypertension   Palpitations      Home Medications Prior to Admission medications   Medication Sig Start Date End Date Taking? Authorizing Provider  acetaminophen (TYLENOL) 500 MG tablet Take 500 mg by mouth every 6 (six) hours as needed (pain).     [provider]  ALPRAZolam Prudy Feeler) 0.25 MG tablet Take 1 tablet (0.25 mg total) by mouth 3 (three) times daily as needed for anxiety or sleep. 02/05/19   Sherryll Burger, Pratik D, DO  amLODipine (NORVASC) 5 MG tablet Take 7.5 mg by mouth daily. Total daily dose=7.5 mg    [provider]  flecainide (TAMBOCOR) 50 MG tablet Take 1 tablet (50 mg total) by mouth 2 (two) times daily. 07/12/23   Camnitz, Andree Coss, MD  hydrALAZINE (APRESOLINE) 10 MG tablet Take 1 tablet (10 mg total) by mouth 3 (three) times daily. 07/12/23   Camnitz, Andree Coss, MD  metoprolol succinate (TOPROL XL) 25 MG 24 hr tablet Take 0.5 tablets (12.5 mg total) by mouth at bedtime. Please keep upcoming appointment 11/24 with provider for future refills.Thank you 07/12/23   Regan Lemming, MD  Multiple Vitamins-Minerals (MULTIVITAMIN WITH MINERALS) tablet Take 1 tablet by mouth daily.    [provider]  olmesartan (BENICAR) 40 MG tablet Take 40 mg by mouth every evening.  12/01/18   [provider]  rivaroxaban (XARELTO) 20 MG TABS tablet Take 1 tablet (20 mg total) by mouth daily with supper. 02/01/19   Triplett, Tammy, PA-C      Allergies    Chloromycetin  [chloramphenicol]    Review of Systems   Review of Systems  Cardiovascular:  Positive for palpitations.    Physical Exam Updated Vital Signs BP (!) 163/86   Pulse 100   Temp 98.6 F (37 C) (Oral)   Resp (!) 21   SpO2 98%  Physical Exam  ED Results / Procedures / Treatments   Labs (all labs ordered are listed, but only abnormal results are displayed) Labs Reviewed  BASIC METABOLIC PANEL - Abnormal; Notable for the following components:      Result Value   Potassium 3.4 (*)    Glucose, Bld 109 (*)    All other components within normal limits  CBC  HEPATIC FUNCTION PANEL  TROPONIN I (HIGH SENSITIVITY)  TROPONIN I (HIGH SENSITIVITY)    EKG None  Radiology DG Chest Port 1 View  Result Date: 08/05/2023 CLINICAL DATA:  Atrial fibrillation EXAM: PORTABLE CHEST 1 VIEW COMPARISON:  02/08/2019 FINDINGS: Lungs are clear.  No pleural effusion or pneumothorax. The heart is normal in size.  Thoracic aortic atherosclerosis. IMPRESSION: No acute cardiopulmonary disease. Electronically Signed   By: Charline Bills M.D.   On: 08/05/2023 20:35    Procedures Procedures  {Document cardiac monitor, telemetry assessment procedure when appropriate:1}  Medications Ordered in ED Medications  diltiazem (CARDIZEM) 125 mg in dextrose 5% 125 mL (1 mg/mL) infusion (10 mg/hr Intravenous Rate/Dose Change 08/05/23 2054)  diltiazem (CARDIZEM)  injection 10 mg (10 mg Intravenous Given 08/05/23 1940)  potassium chloride SA (KLOR-CON M) CR tablet 20 mEq (20 mEq Oral Given 08/05/23 2059)    ED Course/ Medical Decision Making/ A&P   {  CRITICAL CARE Performed by: Bethann Berkshire Total critical care time: 45 minutes Critical care time was exclusive of separately billable procedures and treating other patients. Critical care was necessary to treat or prevent imminent or life-threatening deterioration. Critical care was time spent personally by me on the following activities: development of treatment plan  with patient and/or surrogate as well as nursing, discussions with consultants, evaluation of patient's response to treatment, examination of patient, obtaining history from patient or surrogate, ordering and performing treatments and interventions, ordering and review of laboratory studies, ordering and review of radiographic studies, pulse oximetry and re-evaluation of patient's condition.  Click here for ABCD2, HEART and other calculatorsREFRESH Note before signing :1}                              Medical Decision Making Amount and/or Complexity of Data Reviewed Labs: ordered. Radiology: ordered.  Risk Prescription drug management. Decision regarding hospitalization.   Rapid atrial fibs.  Patient will be admitted to medicine and placed on Cardizem drip  {Document critical care time when appropriate:1} {Document review of labs and clinical decision tools ie heart score, Chads2Vasc2 etc:1}  {Document your independent review of radiology images, and any outside records:1} {Document your discussion with family members, caretakers, and with consultants:1} {Document social determinants of health affecting pt's care:1} {Document your decision making why or why not admission, treatments were needed:1} Final Clinical Impression(s) / ED Diagnoses Final diagnoses:  Atrial fibrillation with RVR (HCC)    Rx / DC Orders ED Discharge Orders     None

## 2023-08-05 NOTE — Assessment & Plan Note (Signed)
Check TSH 

## 2023-08-05 NOTE — Assessment & Plan Note (Signed)
-   Continue ARB, metoprolol, Norvasc, hydralazine

## 2023-08-06 DIAGNOSIS — D6869 Other thrombophilia: Secondary | ICD-10-CM | POA: Diagnosis not present

## 2023-08-06 DIAGNOSIS — Z888 Allergy status to other drugs, medicaments and biological substances status: Secondary | ICD-10-CM | POA: Diagnosis not present

## 2023-08-06 DIAGNOSIS — I480 Paroxysmal atrial fibrillation: Secondary | ICD-10-CM | POA: Diagnosis not present

## 2023-08-06 DIAGNOSIS — Z96653 Presence of artificial knee joint, bilateral: Secondary | ICD-10-CM | POA: Diagnosis not present

## 2023-08-06 DIAGNOSIS — F419 Anxiety disorder, unspecified: Secondary | ICD-10-CM | POA: Diagnosis not present

## 2023-08-06 DIAGNOSIS — I69320 Aphasia following cerebral infarction: Secondary | ICD-10-CM | POA: Diagnosis not present

## 2023-08-06 DIAGNOSIS — Z8673 Personal history of transient ischemic attack (TIA), and cerebral infarction without residual deficits: Secondary | ICD-10-CM | POA: Diagnosis not present

## 2023-08-06 DIAGNOSIS — I4891 Unspecified atrial fibrillation: Secondary | ICD-10-CM | POA: Diagnosis not present

## 2023-08-06 DIAGNOSIS — I4892 Unspecified atrial flutter: Secondary | ICD-10-CM | POA: Diagnosis not present

## 2023-08-06 DIAGNOSIS — I10 Essential (primary) hypertension: Secondary | ICD-10-CM | POA: Diagnosis not present

## 2023-08-06 DIAGNOSIS — E785 Hyperlipidemia, unspecified: Secondary | ICD-10-CM | POA: Diagnosis not present

## 2023-08-06 DIAGNOSIS — E876 Hypokalemia: Secondary | ICD-10-CM | POA: Diagnosis not present

## 2023-08-06 DIAGNOSIS — Z7901 Long term (current) use of anticoagulants: Secondary | ICD-10-CM | POA: Diagnosis not present

## 2023-08-06 LAB — CBC WITH DIFFERENTIAL/PLATELET
Abs Immature Granulocytes: 0.03 10*3/uL (ref 0.00–0.07)
Basophils Absolute: 0 10*3/uL (ref 0.0–0.1)
Basophils Relative: 1 %
Eosinophils Absolute: 0.1 10*3/uL (ref 0.0–0.5)
Eosinophils Relative: 2 %
HCT: 37.6 % (ref 36.0–46.0)
Hemoglobin: 12.3 g/dL (ref 12.0–15.0)
Immature Granulocytes: 1 %
Lymphocytes Relative: 22 %
Lymphs Abs: 1.4 10*3/uL (ref 0.7–4.0)
MCH: 32.1 pg (ref 26.0–34.0)
MCHC: 32.7 g/dL (ref 30.0–36.0)
MCV: 98.2 fL (ref 80.0–100.0)
Monocytes Absolute: 0.6 10*3/uL (ref 0.1–1.0)
Monocytes Relative: 9 %
Neutro Abs: 4.4 10*3/uL (ref 1.7–7.7)
Neutrophils Relative %: 65 %
Platelets: 152 10*3/uL (ref 150–400)
RBC: 3.83 MIL/uL — ABNORMAL LOW (ref 3.87–5.11)
RDW: 12.4 % (ref 11.5–15.5)
WBC: 6.6 10*3/uL (ref 4.0–10.5)
nRBC: 0 % (ref 0.0–0.2)

## 2023-08-06 LAB — COMPREHENSIVE METABOLIC PANEL
ALT: 18 U/L (ref 0–44)
AST: 21 U/L (ref 15–41)
Albumin: 4 g/dL (ref 3.5–5.0)
Alkaline Phosphatase: 68 U/L (ref 38–126)
Anion gap: 9 (ref 5–15)
BUN: 11 mg/dL (ref 8–23)
CO2: 27 mmol/L (ref 22–32)
Calcium: 9.4 mg/dL (ref 8.9–10.3)
Chloride: 101 mmol/L (ref 98–111)
Creatinine, Ser: 0.72 mg/dL (ref 0.44–1.00)
GFR, Estimated: >60 mL/min (ref >60)
Glucose, Bld: 84 mg/dL (ref 70–99)
Potassium: 3.6 mmol/L (ref 3.5–5.1)
Sodium: 137 mmol/L (ref 135–145)
Total Bilirubin: 0.9 mg/dL (ref <1.2)
Total Protein: 6.3 g/dL — ABNORMAL LOW (ref 6.5–8.1)

## 2023-08-06 LAB — TSH: TSH: 2.27 u[IU]/mL (ref 0.350–4.500)

## 2023-08-06 LAB — MAGNESIUM: Magnesium: 1.9 mg/dL (ref 1.7–2.4)

## 2023-08-06 LAB — MRSA NEXT GEN BY PCR, NASAL: MRSA by PCR Next Gen: NOT DETECTED

## 2023-08-06 MED ORDER — POTASSIUM CHLORIDE CRYS ER 20 MEQ PO TBCR
40.0000 meq | EXTENDED_RELEASE_TABLET | Freq: Four times a day (QID) | ORAL | Status: DC
Start: 2023-08-06 — End: 2023-08-06
  Administered 2023-08-06: 40 meq via ORAL
  Filled 2023-08-06 (×2): qty 2

## 2023-08-06 MED ORDER — METOPROLOL SUCCINATE ER 25 MG PO TB24: 12.5000 mg | ORAL_TABLET | Freq: Every day | ORAL | Status: DC

## 2023-08-06 MED ORDER — MAGNESIUM SULFATE 2 GM/50ML IV SOLN
2.0000 g | Freq: Once | INTRAVENOUS | Status: AC
  Administered 2023-08-06: 2 g via INTRAVENOUS
  Filled 2023-08-06: qty 50

## 2023-08-06 MED ORDER — AMLODIPINE BESYLATE 5 MG PO TABS
7.5000 mg | ORAL_TABLET | Freq: Every day | ORAL | Status: DC
  Administered 2023-08-06: 7.5 mg via ORAL
  Filled 2023-08-06: qty 2

## 2023-08-06 NOTE — Hospital Course (Addendum)
72 year old female with history of paroxysmal atrial fibrillation status post ablation 05/08/2019, hypertension, hypothyroidism, stroke, uterine cancer presenting with feeling her heart racing on the evening of 08/05/2023.  The patient checked her heart rate at home and it was in the 150s.  She took an extra flecainide which did not help.  She denies any syncope or dizziness.  She reported some sob with the episode. She denies any fevers, chills, chest pain, nausea, vomiting or diarrhea, abdominal pain.  She has lost over 30+ lbs within the past 3-4 months with intentional dietary changes. She limits her caffeine intake to occasional tea and does not consume alcohol. She denies any new medications or sick exposures.   In the ED, the patient was afebrile and hemodynamically stable with oxygen saturation 100% room air.  Heart rate was in the 130s.  EKG showed atrial flutter with RVR with nonspecific ST changes.  Troponin 5>> 8.  Chest x-ray was negative.  BMP showed sodium 135, potassium 3.4, bicarbonate 25, serum creatinine 0.74.  LFTs were unremarkable.  WBC 6.3, hemoglobin 13.9, platelets 159.  The patient was started on diltiazem drip.  She converted back to sinus rhythm in early am 08/06/23 and diltiazem drip was stopped.  Cardiology was consulted to assist with management.

## 2023-08-06 NOTE — Plan of Care (Signed)
  Problem: Education: Goal: Knowledge of General Education information will improve Description: Including pain rating scale, medication(s)/side effects and non-pharmacologic comfort measures Outcome: Progressing   Problem: Coping: Goal: Level of anxiety will decrease Outcome: Progressing   Problem: Elimination: Goal: Will not experience complications related to urinary retention Outcome: Progressing   Problem: Pain Management: Goal: General experience of comfort will improve Outcome: Progressing   Problem: Skin Integrity: Goal: Risk for impaired skin integrity will decrease Outcome: Progressing

## 2023-08-06 NOTE — Consult Note (Addendum)
Cardiology Consultation   Patient ID: Melissa [name] MRN: [id number]; DOB: Oct 27, 1950  Admit date: 08/05/2023 Date of Consult: 08/06/2023  PCP:  Carylon Perches, MD   [facility] Providers Cardiologist: Previously Dr. Purvis Sheffield  Electrophysiologist:  Will Jorja Loa, MD       Patient Profile:   Melissa [name] is a 72 y.o. female with a hx of paroxysmal atrial fibrillation (s/p ablation in 05/2019 and on Flecainide with prior dose reduction due to widening QRS/PR interval), prior CVA (in 2015), HTN and history of uterine cancer who is being seen 08/06/2023 for the evaluation of atrial fibrillation with RVR at the request of Dr. Carren Rang.  History of Present Illness:   Melissa [name] was last examined by Otilio Saber, PA in 11/2022 and reported doing well at that time and denied any breakthrough atrial fibrillation. She was in sinus bradycardia with heart rate of 53 bpm and PR interval had improved when compared to prior imaging. She was continued on Flecainide 50 mg twice daily along with Xarelto for anticoagulation. BP was elevated and Hydralazine was increased to 10 mg 3 times daily.  She presented to [facility] ED on 08/05/2023 for evaluation of tachycardia with heart rate in the 160's when checked at home. In talking with the patient today, she reports she was in her normal state of health until yesterday when she developed palpitations which resembled her prior atrial fibrillation. Symptoms persisted at rest and with activity and HR was elevated. Denies any associated dizziness or presyncope. No chest pain. Did report some shortness of breath. She has lost over 30+ lbs within the past 3-4 months with intentional dietary changes. She limits her caffeine intake to occasional tea and does not consume alcohol. No new OTC medications or sick exposures. She reports good compliance with her medications and has not missed any doses of Flecainide or Xarelto. She did take an extra  dose of Flecainide yesterday but had persistent symptoms which prompted her to come to the ED for further evaluation.   Initial labs showed WBC 6.3, Hgb 13.9, platelets 159, Na+ 135, K+ 3.4 and creatinine 0.74. AST 27 and ALT 23. Initial and repeat Hs Troponin negative at 5 and 8. Mg 1.9. TSH 2.270. CXR with no acute cardiopulmonary abnormalities. EKG showed likely atrial flutter with RVR, heart rate 138 with IVCD.  She has been continued on Flecainide 50 mg twice daily along with being started on IV Cardizem. Has also been continued on Xarelto for anticoagulation. She did convert back to NSR around 0030 and HR has been in the 40's to 60's. IV Cardizem was stopped at that time.    Past Medical History:  Diagnosis Date   Atrial fibrillation (HCC)    CVA (cerebral infarction) 11/2013   aphasia   Hypertension    Hyperthyroidism    remote   Uterine cancer [facility])     Past Surgical History:  Procedure Laterality Date   ABDOMINAL HYSTERECTOMY     ATRIAL FIBRILLATION ABLATION N/A 05/08/2019   Procedure: ATRIAL FIBRILLATION ABLATION;  Surgeon: Regan Lemming, MD;  Location: [facility] INVASIVE CV LAB;  Service: Cardiovascular;  Laterality: N/A;   CHOLECYSTECTOMY     COLONOSCOPY N/A 05/12/2014   Procedure: COLONOSCOPY;  Surgeon: Corbin Ade, MD;  Location: [facility] ENDO SUITE;  Service: Endoscopy;  Laterality: N/A;  1130   COLONOSCOPY N/A 07/26/2017   Procedure: COLONOSCOPY;  Surgeon: Corbin Ade, MD;  Location: [facility] ENDO SUITE;  Service: Endoscopy;  Laterality: N/A;  1:00 pm   REPLACEMENT TOTAL KNEE BILATERAL     bilateral knees replaced twice     Home Medications:  Prior to Admission medications   Medication Sig Start Date End Date Taking? Authorizing Provider  acetaminophen (TYLENOL) 500 MG tablet Take 500 mg by mouth every 6 (six) hours as needed (pain).     [provider]  ALPRAZolam Prudy Feeler) 0.25 MG tablet Take 1 tablet (0.25 mg total) by mouth 3 (three) times daily as needed for  anxiety or sleep. 02/05/19   Sherryll Burger, Pratik D, DO  amLODipine (NORVASC) 5 MG tablet Take 7.5 mg by mouth daily. Total daily dose=7.5 mg    [provider]  flecainide (TAMBOCOR) 50 MG tablet Take 1 tablet (50 mg total) by mouth 2 (two) times daily. 07/12/23   Camnitz, Andree Coss, MD  hydrALAZINE (APRESOLINE) 10 MG tablet Take 1 tablet (10 mg total) by mouth 3 (three) times daily. 07/12/23   Camnitz, Andree Coss, MD  metoprolol succinate (TOPROL XL) 25 MG 24 hr tablet Take 0.5 tablets (12.5 mg total) by mouth at bedtime. Please keep upcoming appointment 11/24 with provider for future refills.Thank you 07/12/23   Regan Lemming, MD  Multiple Vitamins-Minerals (MULTIVITAMIN WITH MINERALS) tablet Take 1 tablet by mouth daily.    [provider]  olmesartan (BENICAR) 40 MG tablet Take 40 mg by mouth every evening.  12/01/18   [provider]  rivaroxaban (XARELTO) 20 MG TABS tablet Take 1 tablet (20 mg total) by mouth daily with supper. 02/01/19   Pauline Aus, PA-C    Inpatient Medications: Scheduled Meds:  Chlorhexidine Gluconate Cloth  6 each Topical Daily   flecainide  50 mg Oral BID   hydrALAZINE  10 mg Oral TID   irbesartan  300 mg Oral Daily   metoprolol succinate  12.5 mg Oral QHS   rivaroxaban  20 mg Oral Q supper   Continuous Infusions:  diltiazem (CARDIZEM) infusion Stopped (08/06/23 0025)   PRN Meds: acetaminophen **OR** acetaminophen, ALPRAZolam, morphine injection, ondansetron **OR** ondansetron (ZOFRAN) IV, oxyCODONE  Allergies:    Allergies  Allergen Reactions   Chloromycetin [Chloramphenicol] Nausea And Vomiting    Pt states this "makes her sick"    Social History:   Social History   Socioeconomic History   Marital status: Married    Spouse name: Not on file   Number of children: 1   Years of education: college   Highest education level: Not on file  Occupational History   Occupation: s    Employer: [employer]  Tobacco Use    Smoking status: Never   Smokeless tobacco: Never   Tobacco comments:    Never smoked  Vaping Use   Vaping status: Never Used  Substance and Sexual Activity   Alcohol use: No    Alcohol/week: 0.0 standard drinks of alcohol   Drug use: No   Sexual activity: Not on file  Other Topics Concern   Not on file  Social History Narrative   Patient lives at home with her husband.   Patient drinks soda and coffee diet.    Family History:    Family History  Problem Relation Age of Onset   CVA Maternal Grandfather 62   Colon cancer Neg Hx      ROS:  Please see the history of present illness.   All other ROS reviewed and negative.     Physical Exam/Data:   Vitals:   08/06/23 0341 08/06/23 0400 08/06/23 0500 08/06/23 0700  BP:  126/72 (!) 135/57 (!) 147/52  Pulse:  (!) 50 (!) 43 (!) 44  Resp:  (!) 30 10 13   Temp: 97.7 F (36.5 C)     TempSrc: Oral     SpO2:  100% 95% 100%  Weight:      Height:        Intake/Output Summary (Last 24 hours) at 08/06/2023 0748 Last data filed at 08/06/2023 0101 Gross per 24 hour  Intake 41.78 ml  Output --  Net 41.78 ml      08/06/2023    1:12 AM 08/06/2023   12:02 AM 12/21/2022    9:24 AM  Last 3 Weights  Weight (lbs) 148 lb 13 oz 148 lb 13 oz 184 lb  Weight (kg) 67.5 kg 67.5 kg 83.462 kg     Body mass index is 23.31 kg/m.  General:  Well nourished, well developed female appearing in no acute distress. HEENT: normal Neck: no JVD Vascular: No carotid bruits; Distal pulses 2+ bilaterally Cardiac:  normal S1, S2; RRR; no murmur  Lungs:  clear to auscultation bilaterally, no wheezing, rhonchi or rales  Abd: soft, nontender, no hepatomegaly  Ext: no pitting edema Musculoskeletal:  No deformities, BUE and BLE strength normal and equal Skin: warm and dry  Neuro:  CNs 2-12 intact, no focal abnormalities noted Psych:  Normal affect   EKG:  The EKG was personally reviewed and demonstrates: Atrial flutter with RVR, heart rate 138 with  IVCD.  Relevant CV Studies:  Echocardiogram: 08/2021 IMPRESSIONS     1. Left ventricular ejection fraction, by estimation, is 60 to 65%. The  left ventricle has normal function. The left ventricle has no regional  wall motion abnormalities. There is mild asymmetric left ventricular  hypertrophy of the basal-septal segment.  Left ventricular diastolic parameters are consistent with Grade I  diastolic dysfunction (impaired relaxation).   2. Right ventricular systolic function is normal. The right ventricular  size is normal. There is mildly elevated pulmonary artery systolic  pressure. The estimated right ventricular systolic pressure is 38.0 mmHg.   3. The mitral valve is normal in structure. Trivial mitral valve  regurgitation.   4. The aortic valve is tricuspid. There is mild thickening of the aortic  valve. Aortic valve regurgitation is not visualized. Aortic valve  sclerosis is present, with no evidence of aortic valve stenosis.   5. The inferior vena cava is normal in size with greater than 50%  respiratory variability, suggesting right atrial pressure of 3 mmHg.   Comparison(s): Compared to prior TTE in 01/2019, there is no significant  change.   Laboratory Data:  High Sensitivity Troponin:   Recent Labs  Lab 08/05/23 1936 08/05/23 2127  TROPONINIHS 5 8     Chemistry Recent Labs  Lab 08/05/23 1936 08/06/23 0426  NA 135 137  K 3.4* 3.6  CL 99 101  CO2 25 27  GLUCOSE 109* 84  BUN 13 11  CREATININE 0.74 0.72  CALCIUM 9.9 9.4  MG  --  1.9  GFRNONAA >60 >60  ANIONGAP 11 9    Recent Labs  Lab 08/05/23 1936 08/06/23 0426  PROT 8.1 6.3*  ALBUMIN 5.0 4.0  AST 27 21  ALT 23 18  ALKPHOS 88 68  BILITOT 0.8 0.9   Lipids No results for input(s): "CHOL", "TRIG", "HDL", "LABVLDL", "LDLCALC", "CHOLHDL" in the last 168 hours.  Hematology Recent Labs  Lab 08/05/23 1936 08/06/23 0426  WBC 6.3 6.6  RBC 4.21 3.83*  HGB 13.9  12.3  HCT 40.8 37.6  MCV 96.9 98.2   MCH 33.0 32.1  MCHC 34.1 32.7  RDW 12.4 12.4  PLT 159 152   Thyroid  Recent Labs  Lab 08/06/23 0426  TSH 2.270    BNPNo results for input(s): "BNP", "PROBNP" in the last 168 hours.  DDimer No results for input(s): "DDIMER" in the last 168 hours.   Radiology/Studies:  DG Chest Port 1 View  Result Date: 08/05/2023 CLINICAL DATA:  Atrial fibrillation EXAM: PORTABLE CHEST 1 VIEW COMPARISON:  02/08/2019 FINDINGS: Lungs are clear.  No pleural effusion or pneumothorax. The heart is normal in size.  Thoracic aortic atherosclerosis. IMPRESSION: No acute cardiopulmonary disease. Electronically Signed   By: Charline Bills M.D.   On: 08/05/2023 20:35     Assessment and Plan:   1. Paroxysmal Atrial Fibrillation/Flutter - She has a history of atrial fibrillation with prior ablation in 05/2019 and presented with recurrent tachycardia and difficult to discern on initial EKG tracing but rhythm strips yesterday appear most consistent with atrial flutter with RVR. K+ was low at 3.4 and has been replaced. She did convert back to NSR around 0030 and has been in NSR since. While talking with the patient, she appeared to be having junctional beats with HR still in the 60's. Converted back to NSR prior to EKG being obtained. Will stop Toprol-XL 12.5mg  daily. Cardizem CD already discontinued overnight.  - She was on Flecainide 50mg  BID prior to admission as she had previously been on 100mg  BID in 2020 but dosing was reduced due to prolongation of her QRS and PR intervals. Will likely need to review with EP in regards to titration of dosing to possibly 75mg  BID. If intolerant to this, would need to consider other antiarrhythmic medications or possible repeat ablation. Unable to titrate beta-blockers given baseline HR in the 50's and junctional beats this morning.  - No reports of active bleeding. Continue Xarelto 20mg  daily for anticoagulation which is the appropriate dose given her CrCl of 60.9 mL/min.  2.  HTN - BP at 147/52 on most recent check. Will stop Toprol-XL as outlined above. Continue Hydralazine 10mg  TID and Olmesartan 40mg  daily (ordered as Irbesartan given formulary). Cam restart Amlodipine since Cardizem has been discontinued.   3. History of CVA - She is not on ASA given the need for anticoagulation. Defer statin to PCP. LDL was at 63 in 01/2023 by review of [facility] DXA.    Risk Assessment/Risk Scores:    CHA2DS2-VASc Score =     This indicates a  % annual risk of stroke. The patient's score is based upon:       For questions or updates, please contact [facility] Please consult [email or web address] for contact info under    Signed, Ellsworth Lennox, PA-C  08/06/2023 7:48 AM  Attending note  Patient seen and discussed with PA Iran Ouch, I agree with her documentation. 72 yo female history of afib with prior ablation 05/2019, prior CVA, HTN presented with palpitations   K 3.4 BUN 13 Cr 0.74 BUN 13 WBC 6.3 Hgb 13.9 Plt 159 Mg 1.9 TSH 2.2 Trop 5-->8 EKG likely aflutter 140 CXR no acute process   1.Afib/aflutter this admission - history of prior ablations. On flecanide and metoprolol at home - presented with palpitations. EKG more suggestive of aflutter with RVR - started on dilt gtt initially, stopped last night around 10pm. Took her toprol 12.5mg  last night at 11pm along with flecanide. Some low heart rates at times postconversion,  had some residual IV diltiazem in her system as well - converted on her own back to SR at [place] after midnight. Few short aflutter recurrences in the AM with normal ventricular rates.  - K 3.4 on admission, Mg 1.9. Repleate to K >4 and Mg >2.   - in general has done very well on her home regimen, first episode of palpitations. Perhaps exacerbated by hypokalemia. I would not change her overall strategy at this time, continue flecanide and torpol. If recurrences then would need to consider alternative long term strategy. From notes  prolonged PR and QRS on flecanide 100mg  bid in the past.  - follow rhythm until early afternoon, if stable would be ok for discharge after her IV magnesium is complete.    Dina Rich MD

## 2023-08-06 NOTE — Care Management Obs Status (Signed)
MEDICARE OBSERVATION STATUS NOTIFICATION   Patient Details  Name: Melissa [name] MRN: [id number] Date of Birth: 1951-03-06   Medicare Observation Status Notification Given:  Yes    Villa Herb, LCSWA 08/06/2023, 2:11 PM

## 2023-08-06 NOTE — TOC CM/SW Note (Signed)
Transition of Care [facility]) - Inpatient Brief Assessment   Patient Details  Name: Melissa [name] MRN: [id number] Date of Birth: Jan 04, 1951  Transition of Care [facility]) CM/SW Contact:    Villa Herb, LCSWA Phone Number: 08/06/2023, 10:10 AM   Clinical Narrative: Transition of Care Department [facility]) has reviewed patient and no TOC needs have been identified at this time. We will continue to monitor patient advancement through interdisciplinary progression rounds. If new patient transition needs arise, please place a TOC consult.  Transition of Care Asessment: Insurance and Status: Insurance coverage has been reviewed Patient has primary care physician: Yes Home environment has been reviewed: from home Prior level of function:: independent Prior/Current Home Services: No current home services Social Determinants of Health Reivew: SDOH reviewed no interventions necessary Readmission risk has been reviewed: Yes Transition of care needs: no transition of care needs at this time

## 2023-08-06 NOTE — Discharge Summary (Signed)
Physician Discharge Summary   Patient: Melissa [name] MRN: [id number] DOB: [birth year]  Admit date:     08/05/2023  Discharge date: 08/06/23  Discharge Physician: Onalee Hua Daouda Lonzo   PCP: Carylon Perches, MD   Recommendations at discharge:   Please follow up with primary care provider within 1-2 weeks  Please repeat BMP and CBC in one week     Hospital Course: 72 year old female with history of paroxysmal atrial fibrillation status post ablation 05/08/2019, hypertension, hypothyroidism, stroke, uterine cancer presenting with feeling her heart racing on the evening of 08/05/2023.  The patient checked her heart rate at home and it was in the 150s.  She took an extra flecainide which did not help.  She denies any syncope or dizziness.  She reported some sob with the episode. She denies any fevers, chills, chest pain, nausea, vomiting or diarrhea, abdominal pain.  She has lost over 30+ lbs within the past 3-4 months with intentional dietary changes. She limits her caffeine intake to occasional tea and does not consume alcohol. She denies any new medications or sick exposures.   In the ED, the patient was afebrile and hemodynamically stable with oxygen saturation 100% room air.  Heart rate was in the 130s.  EKG showed atrial flutter with RVR with nonspecific ST changes.  Troponin 5>> 8.  Chest x-ray was negative.  BMP showed sodium 135, potassium 3.4, bicarbonate 25, serum creatinine 0.74.  LFTs were unremarkable.  WBC 6.3, hemoglobin 13.9, platelets 159.  The patient was started on diltiazem drip.  She converted back to sinus rhythm in early am 08/06/23 and diltiazem drip was stopped.  Cardiology was consulted to assist with management.  Assessment/Plan: Paroxysmal atrial fibrillation with RVR -Initially on diltiazem drip -Spontaneously converted back to sinus rhythm -Continue flecainide and metoprolol succinate -Continue rivaroxaban -Appreciate cardiology consult>>optimize electrolytes, cleared for d/c on  11/5 -TSH 2.270 -09/13/2021 echo EF 60 to 65%, no WMA, grade 1 DD, normal RVF   Essential hypertension -Continue hydralazine, ARB -Holding amlodipine until patient is evaluated by cardiology>>restart   Anxiety -Continue home dose of p.o. Lam -PDMP reviewed--no red flags  Hypokalemia -replete -keep >4 -keep mag >2   -she was given mag x 2 grams here   History of stroke -Continue rivaroxaban           Consultants: cardiology Procedures performed: none  Disposition: Home Diet recommendation:  Cardiac diet DISCHARGE MEDICATION: Allergies as of 08/06/2023       Reactions   Chloromycetin [chloramphenicol] Nausea And Vomiting   Pt states this "makes her sick"        Medication List     TAKE these medications    acetaminophen 500 MG tablet Commonly known as: TYLENOL Take 500 mg by mouth every 6 (six) hours as needed (pain).   ALPRAZolam 0.25 MG tablet Commonly known as: XANAX Take 1 tablet (0.25 mg total) by mouth 3 (three) times daily as needed for anxiety or sleep.   amLODipine 5 MG tablet Commonly known as: NORVASC Take 7.5 mg by mouth daily. Total daily dose=7.5 mg   flecainide 50 MG tablet Commonly known as: TAMBOCOR Take 1 tablet (50 mg total) by mouth 2 (two) times daily.   hydrALAZINE 10 MG tablet Commonly known as: APRESOLINE Take 1 tablet (10 mg total) by mouth 3 (three) times daily.   metoprolol succinate 25 MG 24 hr tablet Commonly known as: Toprol XL Take 0.5 tablets (12.5 mg total) by mouth at bedtime. Please keep upcoming appointment 11/24 with  provider for future refills.Thank you   multivitamin with minerals tablet Take 1 tablet by mouth daily.   olmesartan 40 MG tablet Commonly known as: BENICAR Take 40 mg by mouth every evening.   rivaroxaban 20 MG Tabs tablet Commonly known as: XARELTO Take 1 tablet (20 mg total) by mouth daily with supper.        Discharge Exam: Filed Weights   08/06/23 0002 08/06/23 0112  Weight:  67.5 kg 67.5 kg   HEENT:  [place]/AT, No thrush, no icterus CV:  RRR, no rub, no S3, no S4 Lung:  CTA, no wheeze, no rhonchi Abd:  soft/+BS, NT Ext:  No edema, no lymphangitis, no synovitis, no rash   Condition at discharge: stable  The results of significant diagnostics from this hospitalization (including imaging, microbiology, ancillary and laboratory) are listed below for reference.   Imaging Studies: DG Chest Port 1 View  Result Date: 08/05/2023 CLINICAL DATA:  Atrial fibrillation EXAM: PORTABLE CHEST 1 VIEW COMPARISON:  02/08/2019 FINDINGS: Lungs are clear.  No pleural effusion or pneumothorax. The heart is normal in size.  Thoracic aortic atherosclerosis. IMPRESSION: No acute cardiopulmonary disease. Electronically Signed   By: Charline Bills M.D.   On: 08/05/2023 20:35    Microbiology: Results for orders placed or performed during the hospital encounter of 08/05/23  MRSA Next Gen by PCR, Nasal     Status: None   Collection Time: 08/05/23 10:50 PM   Specimen: Nasal Mucosa; Nasal Swab  Result Value Ref Range Status   MRSA by PCR Next Gen NOT DETECTED NOT DETECTED Final    Comment: (NOTE) The GeneXpert MRSA Assay (FDA approved for NASAL specimens only), is one component of a comprehensive MRSA colonization surveillance program. It is not intended to diagnose MRSA infection nor to guide or monitor treatment for MRSA infections. Test performance is not FDA approved in patients less than 35 years old. Performed at [facility], [address], [place], [place] [address]     Labs: CBC: Recent Labs  Lab 08/05/23 1936 08/06/23 0426  WBC 6.3 6.6  NEUTROABS  --  4.4  HGB 13.9 12.3  HCT 40.8 37.6  MCV 96.9 98.2  PLT 159 152   Basic Metabolic Panel: Recent Labs  Lab 08/05/23 1936 08/06/23 0426  NA 135 137  K 3.4* 3.6  CL 99 101  CO2 25 27  GLUCOSE 109* 84  BUN 13 11  CREATININE 0.74 0.72  CALCIUM 9.9 9.4  MG  --  1.9   Liver Function Tests: Recent Labs  Lab  08/05/23 1936 08/06/23 0426  AST 27 21  ALT 23 18  ALKPHOS 88 68  BILITOT 0.8 0.9  PROT 8.1 6.3*  ALBUMIN 5.0 4.0   CBG: No results for input(s): "GLUCAP" in the last 168 hours.  Discharge time spent: greater than 30 minutes.  Signed: Catarina Hartshorn, MD [facility] 08/06/2023

## 2023-08-06 NOTE — Care Management CC44 (Signed)
Condition Code 44 Documentation Completed  Patient Details  Name: Melissa [name] MRN: [id number] Date of Birth: November 16, 1950   Condition Code 44 given:  Yes Patient signature on Condition Code 44 notice:  Yes Documentation of 2 MD's agreement:  Yes Code 44 added to claim:  Yes    Villa Herb, LCSWA 08/06/2023, 2:11 PM

## 2023-08-09 ENCOUNTER — Other Ambulatory Visit: Payer: Self-pay

## 2023-08-09 ENCOUNTER — Encounter (HOSPITAL_COMMUNITY): Payer: Self-pay | Admitting: *Deleted

## 2023-08-09 ENCOUNTER — Inpatient Hospital Stay (HOSPITAL_COMMUNITY)
Admission: EM | Admit: 2023-08-09 | Discharge: 2023-08-11 | DRG: 309 | Disposition: A | Discharge status: Home / Self Care | Payer: PPO | Attending: Internal Medicine | Admitting: Internal Medicine

## 2023-08-09 DIAGNOSIS — Z6823 Body mass index (BMI) 23.0-23.9, adult: Secondary | ICD-10-CM

## 2023-08-09 DIAGNOSIS — D6869 Other thrombophilia: Secondary | ICD-10-CM | POA: Diagnosis present

## 2023-08-09 DIAGNOSIS — E785 Hyperlipidemia, unspecified: Secondary | ICD-10-CM | POA: Diagnosis not present

## 2023-08-09 DIAGNOSIS — Z90710 Acquired absence of both cervix and uterus: Secondary | ICD-10-CM | POA: Diagnosis not present

## 2023-08-09 DIAGNOSIS — Z8673 Personal history of transient ischemic attack (TIA), and cerebral infarction without residual deficits: Secondary | ICD-10-CM

## 2023-08-09 DIAGNOSIS — E66811 Obesity, class 1: Secondary | ICD-10-CM | POA: Diagnosis not present

## 2023-08-09 DIAGNOSIS — Z79899 Other long term (current) drug therapy: Secondary | ICD-10-CM | POA: Diagnosis not present

## 2023-08-09 DIAGNOSIS — I10 Essential (primary) hypertension: Secondary | ICD-10-CM | POA: Diagnosis present

## 2023-08-09 DIAGNOSIS — Z7901 Long term (current) use of anticoagulants: Secondary | ICD-10-CM

## 2023-08-09 DIAGNOSIS — Z888 Allergy status to other drugs, medicaments and biological substances status: Secondary | ICD-10-CM

## 2023-08-09 DIAGNOSIS — Z9049 Acquired absence of other specified parts of digestive tract: Secondary | ICD-10-CM

## 2023-08-09 DIAGNOSIS — I480 Paroxysmal atrial fibrillation: Secondary | ICD-10-CM | POA: Diagnosis not present

## 2023-08-09 DIAGNOSIS — F419 Anxiety disorder, unspecified: Secondary | ICD-10-CM | POA: Diagnosis present

## 2023-08-09 DIAGNOSIS — Z8639 Personal history of other endocrine, nutritional and metabolic disease: Secondary | ICD-10-CM

## 2023-08-09 DIAGNOSIS — Z823 Family history of stroke: Secondary | ICD-10-CM

## 2023-08-09 DIAGNOSIS — I4892 Unspecified atrial flutter: Secondary | ICD-10-CM | POA: Diagnosis present

## 2023-08-09 DIAGNOSIS — I69320 Aphasia following cerebral infarction: Secondary | ICD-10-CM | POA: Diagnosis not present

## 2023-08-09 DIAGNOSIS — I4891 Unspecified atrial fibrillation: Secondary | ICD-10-CM | POA: Diagnosis not present

## 2023-08-09 DIAGNOSIS — Z96653 Presence of artificial knee joint, bilateral: Secondary | ICD-10-CM | POA: Diagnosis not present

## 2023-08-09 DIAGNOSIS — E876 Hypokalemia: Secondary | ICD-10-CM | POA: Diagnosis present

## 2023-08-09 DIAGNOSIS — R000 Tachycardia, unspecified: Secondary | ICD-10-CM | POA: Diagnosis present

## 2023-08-09 DIAGNOSIS — Z8542 Personal history of malignant neoplasm of other parts of uterus: Secondary | ICD-10-CM

## 2023-08-09 DIAGNOSIS — M1990 Unspecified osteoarthritis, unspecified site: Secondary | ICD-10-CM | POA: Diagnosis present

## 2023-08-09 LAB — COMPREHENSIVE METABOLIC PANEL
ALT: 25 U/L (ref 0–44)
AST: 26 U/L (ref 15–41)
Albumin: 4.7 g/dL (ref 3.5–5.0)
Alkaline Phosphatase: 84 U/L (ref 38–126)
Anion gap: 11 (ref 5–15)
BUN: 11 mg/dL (ref 8–23)
CO2: 26 mmol/L (ref 22–32)
Calcium: 10 mg/dL (ref 8.9–10.3)
Chloride: 98 mmol/L (ref 98–111)
Creatinine, Ser: 0.83 mg/dL (ref 0.44–1.00)
GFR, Estimated: >60 mL/min (ref >60)
Glucose, Bld: 106 mg/dL — ABNORMAL HIGH (ref 70–99)
Potassium: 3.6 mmol/L (ref 3.5–5.1)
Sodium: 135 mmol/L (ref 135–145)
Total Bilirubin: 1 mg/dL (ref <1.2)
Total Protein: 7.6 g/dL (ref 6.5–8.1)

## 2023-08-09 LAB — CBC WITH DIFFERENTIAL/PLATELET
Abs Immature Granulocytes: 0.02 10*3/uL (ref 0.00–0.07)
Basophils Absolute: 0.1 10*3/uL (ref 0.0–0.1)
Basophils Relative: 1 %
Eosinophils Absolute: 0.2 10*3/uL (ref 0.0–0.5)
Eosinophils Relative: 4 %
HCT: 40.5 % (ref 36.0–46.0)
Hemoglobin: 13.6 g/dL (ref 12.0–15.0)
Immature Granulocytes: 0 %
Lymphocytes Relative: 22 %
Lymphs Abs: 1.1 10*3/uL (ref 0.7–4.0)
MCH: 32.8 pg (ref 26.0–34.0)
MCHC: 33.6 g/dL (ref 30.0–36.0)
MCV: 97.6 fL (ref 80.0–100.0)
Monocytes Absolute: 0.4 10*3/uL (ref 0.1–1.0)
Monocytes Relative: 8 %
Neutro Abs: 3.3 10*3/uL (ref 1.7–7.7)
Neutrophils Relative %: 65 %
Platelets: 158 10*3/uL (ref 150–400)
RBC: 4.15 MIL/uL (ref 3.87–5.11)
RDW: 12.3 % (ref 11.5–15.5)
WBC: 5 10*3/uL (ref 4.0–10.5)
nRBC: 0 % (ref 0.0–0.2)

## 2023-08-09 LAB — MAGNESIUM: Magnesium: 1.9 mg/dL (ref 1.7–2.4)

## 2023-08-09 LAB — TROPONIN I (HIGH SENSITIVITY): Troponin I (High Sensitivity): 5 ng/L (ref <18)

## 2023-08-09 MED ORDER — RIVAROXABAN 20 MG PO TABS
20.0000 mg | ORAL_TABLET | Freq: Every day | ORAL | Status: DC
  Administered 2023-08-09 – 2023-08-11 (×3): 20 mg via ORAL
  Filled 2023-08-09 (×3): qty 1

## 2023-08-09 MED ORDER — METOPROLOL TARTRATE 5 MG/5ML IV SOLN
5.0000 mg | Freq: Once | INTRAVENOUS | Status: AC
  Administered 2023-08-09: 5 mg via INTRAVENOUS
  Filled 2023-08-09: qty 5

## 2023-08-09 MED ORDER — ONDANSETRON HCL 4 MG/2ML IJ SOLN: 4.0000 mg | Freq: Four times a day (QID) | INTRAMUSCULAR | Status: DC | PRN

## 2023-08-09 MED ORDER — ACETAMINOPHEN 650 MG RE SUPP: 650.0000 mg | Freq: Four times a day (QID) | RECTAL | Status: DC | PRN

## 2023-08-09 MED ORDER — AMIODARONE LOAD VIA INFUSION
150.0000 mg | Freq: Once | INTRAVENOUS | Status: AC
  Administered 2023-08-10: 150 mg via INTRAVENOUS
  Filled 2023-08-09: qty 83.34

## 2023-08-09 MED ORDER — METOPROLOL SUCCINATE ER 25 MG PO TB24
12.5000 mg | ORAL_TABLET | Freq: Every day | ORAL | Status: DC
Start: 2023-08-09 — End: 2023-08-11
  Administered 2023-08-09 – 2023-08-10 (×2): 12.5 mg via ORAL
  Filled 2023-08-09 (×2): qty 1

## 2023-08-09 MED ORDER — SODIUM CHLORIDE 0.9 % IV SOLN: INTRAVENOUS | Status: AC

## 2023-08-09 MED ORDER — ACETAMINOPHEN 325 MG PO TABS: 650.0000 mg | ORAL_TABLET | Freq: Four times a day (QID) | ORAL | Status: DC | PRN

## 2023-08-09 MED ORDER — HYDRALAZINE HCL 10 MG PO TABS
10.0000 mg | ORAL_TABLET | Freq: Three times a day (TID) | ORAL | Status: DC
  Administered 2023-08-09 – 2023-08-11 (×7): 10 mg via ORAL
  Filled 2023-08-09 (×7): qty 1

## 2023-08-09 MED ORDER — LACTATED RINGERS IV BOLUS
1000.0000 mL | Freq: Once | INTRAVENOUS | Status: AC
  Administered 2023-08-09: 1000 mL via INTRAVENOUS

## 2023-08-09 MED ORDER — FLECAINIDE ACETATE 50 MG PO TABS: 50.0000 mg | ORAL_TABLET | Freq: Two times a day (BID) | ORAL | Status: DC

## 2023-08-09 MED ORDER — CHLORHEXIDINE GLUCONATE CLOTH 2 % EX PADS
6.0000 | MEDICATED_PAD | Freq: Every day | CUTANEOUS | Status: DC
  Administered 2023-08-09 – 2023-08-11 (×2): 6 via TOPICAL

## 2023-08-09 MED ORDER — AMIODARONE HCL IN DEXTROSE 360-4.14 MG/200ML-% IV SOLN: 30.0000 mg/h | INTRAVENOUS | Status: DC

## 2023-08-09 MED ORDER — ONDANSETRON HCL 4 MG PO TABS: 4.0000 mg | ORAL_TABLET | Freq: Four times a day (QID) | ORAL | Status: DC | PRN

## 2023-08-09 MED ORDER — DILTIAZEM HCL-DEXTROSE 125-5 MG/125ML-% IV SOLN (PREMIX)
5.0000 mg/h | INTRAVENOUS | Status: DC
  Administered 2023-08-09: 5 mg/h via INTRAVENOUS
  Filled 2023-08-09: qty 125

## 2023-08-09 MED ORDER — MAGNESIUM SULFATE 2 GM/50ML IV SOLN
2.0000 g | Freq: Once | INTRAVENOUS | Status: AC
  Administered 2023-08-09: 2 g via INTRAVENOUS
  Filled 2023-08-09: qty 50

## 2023-08-09 MED ORDER — IRBESARTAN 150 MG PO TABS
300.0000 mg | ORAL_TABLET | Freq: Every day | ORAL | Status: DC
  Administered 2023-08-09 – 2023-08-11 (×3): 300 mg via ORAL
  Filled 2023-08-09 (×3): qty 2

## 2023-08-09 MED ORDER — DOCUSATE SODIUM 100 MG PO CAPS
100.0000 mg | ORAL_CAPSULE | Freq: Two times a day (BID) | ORAL | Status: DC
  Administered 2023-08-09: 100 mg via ORAL
  Filled 2023-08-09 (×2): qty 1

## 2023-08-09 MED ORDER — RIVAROXABAN 10 MG PO TABS: 10.0000 mg | ORAL_TABLET | Freq: Every day | ORAL | Status: DC

## 2023-08-09 MED ORDER — POTASSIUM CHLORIDE CRYS ER 20 MEQ PO TBCR
40.0000 meq | EXTENDED_RELEASE_TABLET | Freq: Once | ORAL | Status: AC
  Administered 2023-08-09: 40 meq via ORAL
  Filled 2023-08-09: qty 2

## 2023-08-09 MED ORDER — ALPRAZOLAM 0.25 MG PO TABS
0.2500 mg | ORAL_TABLET | Freq: Three times a day (TID) | ORAL | Status: DC | PRN
  Administered 2023-08-10 – 2023-08-11 (×2): 0.25 mg via ORAL
  Filled 2023-08-09 (×2): qty 1

## 2023-08-09 MED ORDER — AMIODARONE HCL IN DEXTROSE 360-4.14 MG/200ML-% IV SOLN
60.0000 mg/h | INTRAVENOUS | Status: AC
  Administered 2023-08-10: 60 mg/h via INTRAVENOUS
  Filled 2023-08-09: qty 200

## 2023-08-09 NOTE — ED Triage Notes (Signed)
Pt c/o feeling like her heart is racing that started this morning. She checked her HR and it was 150. Pt reports she feels somewhat better now, but her HR is still above 100. Pt here Monday for the same thing.

## 2023-08-09 NOTE — Plan of Care (Signed)

## 2023-08-09 NOTE — ED Provider Notes (Signed)
[place] EMERGENCY DEPARTMENT AT [facility] Provider Note   CSN: [id number] Arrival date & time: 08/09/23  4098     History  No chief complaint on file.   Melissa [name] is a 72 y.o. female.  She has PMH of hypertension, A-fib status post ablation in 2020, on Xarelto and flecainide currently.  She presents ER today for fast heart rate in the 150s this morning, woke up and felt palpitations like her usual A-fib and took her medications.  She denies shortness of breath, dizziness, syncope.  Admits to some mild chest pain that she states is exactly like prior episodes of A-fib.  She was admitted on 11/4 for A-fib with RVR and states symptoms are exactly the same today though states her heart rate is improved after taking her home medications if she feels much better.  On her admission they had discontinued her Toprol-XL due to low heart rate and junctional beats.   Denies any missed doses of her flecainide or her Xarelto.  HPI     Home Medications Prior to Admission medications   Medication Sig Start Date End Date Taking? Authorizing Provider  acetaminophen (TYLENOL) 500 MG tablet Take 500 mg by mouth every 6 (six) hours as needed (pain).     [provider]  ALPRAZolam Prudy Feeler) 0.25 MG tablet Take 1 tablet (0.25 mg total) by mouth 3 (three) times daily as needed for anxiety or sleep. 02/05/19   Sherryll Burger, Pratik D, DO  amLODipine (NORVASC) 5 MG tablet Take 7.5 mg by mouth daily. Total daily dose=7.5 mg    [provider]  flecainide (TAMBOCOR) 50 MG tablet Take 1 tablet (50 mg total) by mouth 2 (two) times daily. 07/12/23   Camnitz, Andree Coss, MD  hydrALAZINE (APRESOLINE) 10 MG tablet Take 1 tablet (10 mg total) by mouth 3 (three) times daily. 07/12/23   Camnitz, Andree Coss, MD  metoprolol succinate (TOPROL XL) 25 MG 24 hr tablet Take 0.5 tablets (12.5 mg total) by mouth at bedtime. Please keep upcoming appointment 11/24 with provider for future refills.Thank  you 07/12/23   Regan Lemming, MD  Multiple Vitamins-Minerals (MULTIVITAMIN WITH MINERALS) tablet Take 1 tablet by mouth daily.    [provider]  olmesartan (BENICAR) 40 MG tablet Take 40 mg by mouth every evening.  12/01/18   [provider]  rivaroxaban (XARELTO) 20 MG TABS tablet Take 1 tablet (20 mg total) by mouth daily with supper. 02/01/19   Triplett, Tammy, PA-C      Allergies    Chloromycetin [chloramphenicol]    Review of Systems   Review of Systems  Physical Exam Updated Vital Signs Pulse (!) 109   Temp 98 F (36.7 C) (Oral)   Resp (!) 21   Ht 5\' 7"  (1.702 m)   Wt 67.1 kg   SpO2 99%   BMI 23.18 kg/m  Physical Exam Vitals and nursing note reviewed.  Constitutional:      General: She is not in acute distress.    Appearance: She is well-developed.  HENT:     Head: Normocephalic and atraumatic.     Nose: Nose normal.     Mouth/Throat:     Mouth: Mucous membranes are moist.  Eyes:     Conjunctiva/sclera: Conjunctivae normal.  Cardiovascular:     Rate and Rhythm: Normal rate and regular rhythm.     Heart sounds: No murmur heard. Pulmonary:     Effort: Pulmonary effort is normal. No respiratory distress.  Breath sounds: Normal breath sounds.  Abdominal:     Palpations: Abdomen is soft.     Tenderness: There is no abdominal tenderness.  Musculoskeletal:        General: No swelling. Normal range of motion.     Cervical back: Neck supple.  Skin:    General: Skin is warm and dry.     Capillary Refill: Capillary refill takes less than 2 seconds.  Neurological:     General: No focal deficit present.     Mental Status: She is alert and oriented to person, place, and time.  Psychiatric:        Mood and Affect: Mood normal.     ED Results / Procedures / Treatments   Labs (all labs ordered are listed, but only abnormal results are displayed) Labs Reviewed  CBC WITH DIFFERENTIAL/PLATELET  COMPREHENSIVE METABOLIC PANEL  MAGNESIUM   TROPONIN I (HIGH SENSITIVITY)    EKG EKG Interpretation Date/Time:  Friday August 09 2023 08:57:06 EST Ventricular Rate:  111 PR Interval:    QRS Duration:  115 QT Interval:  349 QTC Calculation: 475 R Axis:   251  Text Interpretation: Right and left arm electrode reversal, interpretation assumes no reversal Atrial fibrillation Confirmed by Kommor, Madison ([id number]) on 08/09/2023 9:13:22 AM  Radiology No results found.  Procedures Procedures    Medications Ordered in ED Medications  lactated ringers bolus 1,000 mL (has no administration in time range)    ED Course/ Medical Decision Making/ A&P                                 Medical Decision Making This patient presents to the ED for concern of palpitations, a fib, this involves an extensive number of treatment options, and is a complaint that carries with it a high risk of complications and morbidity.  The differential diagnosis includes a fib, a flutter, dehydration, PE, ACS, other   Co morbidities that complicate the patient evaluation :   a fib   Additional history obtained:  Additional history obtained from EMR External records from outside source obtained and reviewed including notes, labs   Lab Tests:  I Ordered, and personally interpreted labs.  The pertinent results include: Magnesium normal, CMP normal, CBC normal: Troponin negative    Cardiac Monitoring: / EKG:  The patient was maintained on a cardiac monitor.  I personally viewed and interpreted the cardiac monitored which showed an underlying rhythm of: Atrial fibrillation with RVR   Consultations Obtained:  I requested consultation with the Dr. Wyline Mood,  and discussed lab and imaging findings as well as pertinent plan - they recommend: Will eluate the patient in the ED   Problem List / ED Course / Critical interventions / Medication management  A-fib with RVR-had improved by time patient got here but she was still persistently tachycardic.   Consulted with cardiology.  They came and saw patient in the ER.  She was given 5 mg of metoprolol and will plan to IV amiodarone tomorrow, cannot do today because of her flecainide.  Would like patient to be admitted at this time.  I have reviewed the patients home medicines and have made adjustments as needed     Amount and/or Complexity of Data Reviewed Labs: ordered.  Risk Decision regarding hospitalization.           Final Clinical Impression(s) / ED Diagnoses Final diagnoses:  None    Rx / DC  Orders ED Discharge Orders     None         Ma Rings, PA-C 08/09/23 1245    Coral Spikes, [place] 08/09/23 1458

## 2023-08-09 NOTE — H&P (Signed)
History and Physical    Melissa [name] ZOX:[id number] DOB: 1950-12-11 DOA: 08/09/2023  PCP: Carylon Perches, MD  Patient coming from: Home  I have personally briefly reviewed patient's old medical records in [facility]  Chief Complaint: Chest pain associated with palpitations.  HPI: Melissa [name] is 72 y.o. female with PMH significant for essential hypertension, A-fib status post ablation in 2020, presently on Xarelto and flecainide.  Patient presented in the ED today with chest pain associated with palpitations. She woke up with palpitations with heart rate in 150s. She describes chest pain as discomfort associated with palpitations that happens whenever she has A-fib with RVR. She has taken her medications but continued to have symptoms. She denies any shortness of breath, dizziness, syncope.  Patient was admitted on 08/05/2023 for A-fib with RVR and described the symptoms are exactly same.  On her last admission Toprol was discontinued at her discharge due to junctional beats and bradycardia.  Patient denies any sick contacts or recent travel.  ED Course: She was tachycardic, tachypneic, hypertensive, SpO2 99% on room air. HR 109, RR 21, BP 161/76, temp 98, SpO2 99% on room air.   Labs include sodium 135, potassium 3.6, chloride 98, bicarb 26, BUN 11, creatinine 0.83, glucose 106, calcium 10.0, anion gap 11, magnesium 1.9, alkaline phosphatase 84, albumin 4.7, AST 26, ALT 25, total protein 7.6, total bilirubin 1.0, troponin 5, WBC 5.0, hemoglobin 13.6, hematocrit 40.5, platelet 158. EKG shows A-fib with RVR  Review of Systems:   Review of Systems  Constitutional: Negative.   HENT: Negative.    Respiratory:  Positive for cough.   Cardiovascular:  Positive for chest pain and palpitations.  Gastrointestinal: Negative.   Genitourinary: Negative.   Musculoskeletal: Negative.   Skin: Negative.   Neurological: Negative.   Endo/Heme/Allergies: Negative.   Psychiatric/Behavioral:  Negative.      Past Medical History:  Diagnosis Date   Atrial fibrillation (HCC)    CVA (cerebral infarction) 11/2013   aphasia   Hypertension    Hyperthyroidism    remote   Uterine cancer [facility])     Past Surgical History:  Procedure Laterality Date   ABDOMINAL HYSTERECTOMY     ATRIAL FIBRILLATION ABLATION N/A 05/08/2019   Procedure: ATRIAL FIBRILLATION ABLATION;  Surgeon: Regan Lemming, MD;  Location: [facility] INVASIVE CV LAB;  Service: Cardiovascular;  Laterality: N/A;   CHOLECYSTECTOMY     COLONOSCOPY N/A 05/12/2014   Procedure: COLONOSCOPY;  Surgeon: Corbin Ade, MD;  Location: [facility] ENDO SUITE;  Service: Endoscopy;  Laterality: N/A;  1130   COLONOSCOPY N/A 07/26/2017   Procedure: COLONOSCOPY;  Surgeon: Corbin Ade, MD;  Location: [facility] ENDO SUITE;  Service: Endoscopy;  Laterality: N/A;  1:00 pm   REPLACEMENT TOTAL KNEE BILATERAL     bilateral knees replaced twice     reports that she has never smoked. She has never used smokeless tobacco. She reports that she does not drink alcohol and does not use drugs.  Allergies  Allergen Reactions   Chloromycetin [Chloramphenicol] Nausea And Vomiting    Pt states this "makes her sick"    Family History  Problem Relation Age of Onset   CVA Maternal Grandfather 46   Colon cancer Neg Hx     Family history reviewed and not pertinent.  Prior to Admission medications   Medication Sig Start Date End Date Taking? Authorizing Provider  acetaminophen (TYLENOL) 500 MG tablet Take 500 mg by mouth every 6 (six) hours as needed (pain).  Yes [provider]  ALPRAZolam (XANAX) 0.25 MG tablet Take 1 tablet (0.25 mg total) by mouth 3 (three) times daily as needed for anxiety or sleep. 02/05/19  Yes Shah, Pratik D, DO  amLODipine (NORVASC) 5 MG tablet Take 7.5 mg by mouth daily. Total daily dose=7.5 mg   Yes [provider]  flecainide (TAMBOCOR) 50 MG tablet Take 1 tablet (50 mg total) by mouth 2 (two) times daily. 07/12/23  Yes  Camnitz, Andree Coss, MD  hydrALAZINE (APRESOLINE) 10 MG tablet Take 1 tablet (10 mg total) by mouth 3 (three) times daily. 07/12/23  Yes Camnitz, Andree Coss, MD  metoprolol succinate (TOPROL XL) 25 MG 24 hr tablet Take 0.5 tablets (12.5 mg total) by mouth at bedtime. Please keep upcoming appointment 11/24 with provider for future refills.Thank you 07/12/23  Yes Camnitz, Will Daphine Deutscher, MD  Multiple Vitamins-Minerals (MULTIVITAMIN WITH MINERALS) tablet Take 1 tablet by mouth daily.   Yes [provider]  olmesartan (BENICAR) 40 MG tablet Take 40 mg by mouth every evening.  12/01/18  Yes [provider]  rivaroxaban (XARELTO) 20 MG TABS tablet Take 1 tablet (20 mg total) by mouth daily with supper. 02/01/19  Yes Pauline Aus, PA-C    Physical Exam: Vitals:   08/09/23 1200 08/09/23 1215 08/09/23 1230 08/09/23 1323  BP: (!) 150/84 132/77 (!) 153/99   Pulse:  (!) 101 98   Resp: 16 16 16    Temp:    98.1 F (36.7 C)  TempSrc:    Oral  SpO2:  98% 100%   Weight:      Height:        Constitutional: NAD, calm, comfortable, deconditioned Vitals:   08/09/23 1200 08/09/23 1215 08/09/23 1230 08/09/23 1323  BP: (!) 150/84 132/77 (!) 153/99   Pulse:  (!) 101 98   Resp: 16 16 16    Temp:    98.1 F (36.7 C)  TempSrc:    Oral  SpO2:  98% 100%   Weight:      Height:       Eyes: PERRL, lids and conjunctivae normal ENMT: Mucous membranes are moist. Posterior pharynx clear of any exudate or lesions. Neck: normal, supple, no masses, no thyromegaly Respiratory: clear to auscultation bilaterally, no wheezing, no crackles. Normal respiratory effort. No accessory muscle use.  Cardiovascular: S1-S2 heard, irregular rhythm,  no murmurs / rubs / gallops. No extremity edema. 2+ pedal pulses. No carotid bruits.  Abdomen:Soft,  no tenderness, no masses palpated. No hepatosplenomegaly. Bowel sounds positive.  Musculoskeletal: no clubbing / cyanosis. No joint deformity upper and lower  extremities. Good ROM, no contractures. Normal muscle tone.  Skin: no rashes, lesions, ulcers. No induration Neurologic: CN 2-12 grossly intact. Sensation intact, DTR normal. Strength 5/5 in all 4.  Psychiatric: Normal judgment and insight. Alert and oriented x 3. Normal mood.     Labs on Admission: I have personally reviewed following labs and imaging studies  CBC: Recent Labs  Lab 08/05/23 1936 08/06/23 0426 08/09/23 0910  WBC 6.3 6.6 5.0  NEUTROABS  --  4.4 3.3  HGB 13.9 12.3 13.6  HCT 40.8 37.6 40.5  MCV 96.9 98.2 97.6  PLT 159 152 158   Basic Metabolic Panel: Recent Labs  Lab 08/05/23 1936 08/06/23 0426 08/09/23 0910 08/09/23 0924  NA 135 137 135  --   K 3.4* 3.6 3.6  --   CL 99 101 98  --   CO2 25 27 26   --   GLUCOSE 109* 84  106*  --   BUN 13 11 11   --   CREATININE 0.74 0.72 0.83  --   CALCIUM 9.9 9.4 10.0  --   MG  --  1.9  --  1.9   GFR: Estimated Creatinine Clearance: 58.7 mL/min (by C-G formula based on SCr of 0.83 mg/dL). Liver Function Tests: Recent Labs  Lab 08/05/23 1936 08/06/23 0426 08/09/23 0910  AST 27 21 26   ALT 23 18 25   ALKPHOS 88 68 84  BILITOT 0.8 0.9 1.0  PROT 8.1 6.3* 7.6  ALBUMIN 5.0 4.0 4.7   No results for input(s): "LIPASE", "AMYLASE" in the last 168 hours. No results for input(s): "AMMONIA" in the last 168 hours. Coagulation Profile: No results for input(s): "INR", "PROTIME" in the last 168 hours. Cardiac Enzymes: No results for input(s): "CKTOTAL", "CKMB", "CKMBINDEX", "TROPONINI" in the last 168 hours. BNP (last 3 results) No results for input(s): "PROBNP" in the last 8760 hours. HbA1C: No results for input(s): "HGBA1C" in the last 72 hours. CBG: No results for input(s): "GLUCAP" in the last 168 hours. Lipid Profile: No results for input(s): "CHOL", "HDL", "LDLCALC", "TRIG", "CHOLHDL", "LDLDIRECT" in the last 72 hours. Thyroid Function Tests: No results for input(s): "TSH", "T4TOTAL", "FREET4", "T3FREE", "THYROIDAB"  in the last 72 hours. Anemia Panel: No results for input(s): "VITAMINB12", "FOLATE", "FERRITIN", "TIBC", "IRON", "RETICCTPCT" in the last 72 hours. Urine analysis:    Component Value Date/Time   COLORURINE STRAW (A) 09/13/2021 1247   APPEARANCEUR CLEAR 09/13/2021 1247   LABSPEC 1.016 09/13/2021 1247   PHURINE 6.0 09/13/2021 1247   GLUCOSEU NEGATIVE 09/13/2021 1247   HGBUR NEGATIVE 09/13/2021 1247   BILIRUBINUR NEGATIVE 09/13/2021 1247   KETONESUR 5 (A) 09/13/2021 1247   PROTEINUR NEGATIVE 09/13/2021 1247   UROBILINOGEN 0.2 04/02/2014 0310   NITRITE NEGATIVE 09/13/2021 1247   LEUKOCYTESUR NEGATIVE 09/13/2021 1247    Radiological Exams on Admission: No results found.  EKG: Independently reviewed.  A-fib with RVR  Assessment/Plan Principal Problem:   Atrial fibrillation with RVR (HCC) Active Problems:   History of hyperthyroidism   Essential hypertension   Osteoarthritis   Aphasia due to recent cerebrovascular accident   Severe obesity (BMI >= 40) (HCC)   Anxiety   Atrial fibrillation with RVR: Patient presented with palpitations associated with chest pain that woke her up from sleep.  EKG showed A-fib with RVR with heart rate in 150s. Patient had A-fib in the past underwent ablation in 2020 but now had recurrent A-fib last week but converted to normal sinus rhythm with IV Cardizem.  Patient has been taking flecainide 50 mg twice daily regularly. Continue Cardizem infusion. Continue flecainide and Xarelto. Patient needs electrophysiology evaluation in the morning,  Cardiology evaluation appreciated recommended amiodarone infusion in the morning. Patient may require TEE with cardioversion this admission.  Essential hypertension: Continue hydralazine, olmesartan. Hold amlodipine as patient started on Cardizem infusion.  History of CVA: Patient is not on aspirin as patient is on anticoagulation for A-fib.  Hyperlipidemia: Patient is not on Statin. Obtain Lipid  profile.  Severe obesity: Diet and exercise discussed in detail  Anxiety Disorder: Continue Xanax as needed.   DVT prophylaxis: Xarelto Code Status: Full code Family Communication: No family at bed side. Disposition Plan:     Status is: Inpatient Remains inpatient appropriate because: Admitted with A-fib with RVR, requiring Cardizem infusion.  Cardiology is consulted,  Patient may require amiodarone infusion and TEE with cardioversion this admission.   Consults called: Cardiology Admission status: Inpatient  Willeen Niece MD [facility]   If 7PM-7AM, please contact night-coverage [email or web address]   08/09/2023, 3:12 PM

## 2023-08-09 NOTE — Consult Note (Addendum)
Cardiology Consultation   Patient ID: Melissa [name] MRN: [id number]; DOB: Jan 12, 1951  Admit date: 08/09/2023 Date of Consult: 08/09/2023  PCP:  Carylon Perches, MD   [facility] Providers Cardiologist:  None  Electrophysiologist:  Will Jorja Loa, MD       Patient Profile:   Melissa [name] is a 72 y.o. female with a hx of paroxysmal atrial fibrillation (s/p ablation in 05/2019 and on Flecainide with prior dose reduction due to widening QRS/PR interval), prior CVA (in 2015), HTN and history of uterine cancer  who is being seen 08/09/2023 for the evaluation of atrial fibrillation with RVR at the request of Melissa [name], [place].  History of Present Illness:   Melissa [name] was just admitted from 11/4 - 08/06/2023 for atrial fibrillation/flutter with RVR and was continued on Flecainide 50 mg twice daily along with being started on IV Cardizem. She did convert back to normal sinus rhythm overnight and was ultimately discharged the following morning with her home medications. Flecainide was not further titrated as she previously had prolonged PR and QRS intervals with Flecainide 100 mg twice daily in the past. Was discharged on Amlodipine 7.5 mg daily, Flecainide 50 mg twice daily, Hydralazine 10 mg 3 times daily, Toprol-XL 12.5 mg daily, Olmesartan 40 mg daily and Xarelto 20 mg daily.  She presented back to the ED this morning for recurrent tachycardia with heart rate in the 150's when checked at home. In talking with the patient and her husband today, she reports initially feeling well upon hospital discharge but developed recurrent palpitations this morning. Reports her heart rate was significantly elevated when checked at home. She did take her morning medications, including Flecainide but had persistent tachycardia which prompted her to come to the ED for further evaluation. Reports palpitations. No specific dizziness or presyncope. She has minor shortness of breath but no  orthopnea, PND or chest pain. Reports compliance with her medications and has not missed any doses. No recent changes in dietary habits or new OTC medications.  Initial labs showed WBC 5.0, Hgb 13.6, platelets 158, Na+ 135, K+ 3.6 and creatinine 0.83.  Magnesium 1.9. Initial troponin negative at 5 with repeat pending. EKG shows likely atrial flutter, heart rate 111. Heart rate has been in the low 100's to 120's by review of telemetry. She has not yet received anything for rate-control.  Past Medical History:  Diagnosis Date   Atrial fibrillation (HCC)    CVA (cerebral infarction) 11/2013   aphasia   Hypertension    Hyperthyroidism    remote   Uterine cancer [facility])     Past Surgical History:  Procedure Laterality Date   ABDOMINAL HYSTERECTOMY     ATRIAL FIBRILLATION ABLATION N/A 05/08/2019   Procedure: ATRIAL FIBRILLATION ABLATION;  Surgeon: Regan Lemming, MD;  Location: [facility] INVASIVE CV LAB;  Service: Cardiovascular;  Laterality: N/A;   CHOLECYSTECTOMY     COLONOSCOPY N/A 05/12/2014   Procedure: COLONOSCOPY;  Surgeon: Corbin Ade, MD;  Location: [facility] ENDO SUITE;  Service: Endoscopy;  Laterality: N/A;  1130   COLONOSCOPY N/A 07/26/2017   Procedure: COLONOSCOPY;  Surgeon: Corbin Ade, MD;  Location: [facility] ENDO SU[facility];  Service: Endoscopy;  Laterality: N/A;  1:00 pm   REPLACEMENT TOTAL KNEE BILATERAL     bilateral knees replaced twice     Home Medications:  Prior to Admission medications   Medication Sig Start Date End Date Taking? Authorizing Provider  acetaminophen (TYLENOL) 500 MG tablet Take 500 mg by  mouth every 6 (six) hours as needed (pain).    Yes [provider]  ALPRAZolam (XANAX) 0.25 MG tablet Take 1 tablet (0.25 mg total) by mouth 3 (three) times daily as needed for anxiety or sleep. 02/05/19  Yes Shah, Pratik D, DO  amLODipine (NORVASC) 5 MG tablet Take 7.5 mg by mouth daily. Total daily dose=7.5 mg   Yes [provider]  flecainide (TAMBOCOR) 50 MG  tablet Take 1 tablet (50 mg total) by mouth 2 (two) times daily. 07/12/23  Yes Camnitz, Andree Coss, MD  hydrALAZINE (APRESOLINE) 10 MG tablet Take 1 tablet (10 mg total) by mouth 3 (three) times daily. 07/12/23  Yes Camnitz, Andree Coss, MD  metoprolol succinate (TOPROL XL) 25 MG 24 hr tablet Take 0.5 tablets (12.5 mg total) by mouth at bedtime. Please keep upcoming appointment 11/24 with provider for future refills.Thank you 07/12/23  Yes Camnitz, Will Daphine Deutscher, MD  Multiple Vitamins-Minerals (MULTIVITAMIN WITH MINERALS) tablet Take 1 tablet by mouth daily.   Yes [provider]  olmesartan (BENICAR) 40 MG tablet Take 40 mg by mouth every evening.  12/01/18  Yes [provider]  rivaroxaban (XARELTO) 20 MG TABS tablet Take 1 tablet (20 mg total) by mouth daily with supper. 02/01/19  Yes Triplett, Tammy, PA-C    Inpatient Medications: Scheduled Meds:  Continuous Infusions:  magnesium sulfate bolus IVPB     PRN Meds:   Allergies:    Allergies  Allergen Reactions   Chloromycetin [Chloramphenicol] Nausea And Vomiting    Pt states this "makes her sick"    Social History:   Social History   Socioeconomic History   Marital status: Married    Spouse name: Not on file   Number of children: 1   Years of education: college   Highest education level: Not on file  Occupational History   Occupation: s    Employer: [employer]  Tobacco Use   Smoking status: Never   Smokeless tobacco: Never   Tobacco comments:    Never smoked  Vaping Use   Vaping status: Never Used  Substance and Sexual Activity   Alcohol use: No    Alcohol/week: 0.0 standard drinks of alcohol   Drug use: No   Sexual activity: Not on file  Other Topics Concern   Not on file  Social History Narrative   Patient lives at home with her husband.   Patient drinks soda and coffee diet.   Social Determinants of Health   Financial Resource Strain: Not on file  Food Insecurity: No Food Insecurity  (08/05/2023)   Hunger Vital Sign    Worried About Running Out of Food in the Last Year: Never true    Ran Out of Food in the Last Year: Never true  Transportation Needs: No Transportation Needs (08/05/2023)   PRAPARE - Administrator, Civil Service (Medical): No    Lack of Transportation (Non-Medical): No  Physical Activity: Not on file  Stress: Not on file  Social Connections: Not on file  Intimate Partner Violence: Not At Risk (08/05/2023)   Humiliation, Afraid, Rape, and Kick questionnaire    Fear of Current or Ex-Partner: No    Emotionally Abused: No    Physically Abused: No    Sexually Abused: No    Family History:    Family History  Problem Relation Age of Onset   CVA Maternal Grandfather 60   Colon cancer Neg Hx      ROS:  Please see the history  of present illness.   All other ROS reviewed and negative.     Physical Exam/Data:   Vitals:   08/09/23 0857 08/09/23 0900 08/09/23 1000 08/09/23 1030  BP:  (!) 161/76 (!) 170/81 (!) 148/86  Pulse: (!) 109 (!) 103 (!) 122 (!) 105  Resp: (!) 21 16 14 19   Temp: 98 F (36.7 C)     TempSrc: Oral     SpO2: 99% 100% 95% 99%  Weight:      Height:       No intake or output data in the 24 hours ending 08/09/23 1057    08/09/2023    8:55 AM 08/06/2023    1:12 AM 08/06/2023   12:02 AM  Last 3 Weights  Weight (lbs) 148 lb 148 lb 13 oz 148 lb 13 oz  Weight (kg) 67.132 kg 67.5 kg 67.5 kg     Body mass index is 23.18 kg/m.  General:  Well nourished, well developed female appearing in no acute distress HEENT: normal Neck: no JVD Vascular: No carotid bruits; Distal pulses 2+ bilaterally Cardiac:  normal S1, S2; Irregularly irregular Lungs:  clear to auscultation bilaterally, no wheezing, rhonchi or rales  Abd: soft, nontender, no hepatomegaly  Ext: no pitting edema Musculoskeletal:  No deformities, BUE and BLE strength normal and equal Skin: warm and dry  Neuro:  CNs 2-12 intact, no focal abnormalities  noted Psych:  Normal affect   EKG:  The EKG was personally reviewed and demonstrates: Likely atrial flutter with RVR, heart rate 111.  Telemetry:  Telemetry was personally reviewed and demonstrates: Atrial flutter with RVR, heart rate in 100s to 120s.  Relevant CV Studies:  Echocardiogram: 08/2021 IMPRESSIONS     1. Left ventricular ejection fraction, by estimation, is 60 to 65%. The  left ventricle has normal function. The left ventricle has no regional  wall motion abnormalities. There is mild asymmetric left ventricular  hypertrophy of the basal-septal segment.  Left ventricular diastolic parameters are consistent with Grade I  diastolic dysfunction (impaired relaxation).   2. Right ventricular systolic function is normal. The right ventricular  size is normal. There is mildly elevated pulmonary artery systolic  pressure. The estimated right ventricular systolic pressure is 38.0 mmHg.   3. The mitral valve is normal in structure. Trivial mitral valve  regurgitation.   4. The aortic valve is tricuspid. There is mild thickening of the aortic  valve. Aortic valve regurgitation is not visualized. Aortic valve  sclerosis is present, with no evidence of aortic valve stenosis.   5. The inferior vena cava is normal in size with greater than 50%  respiratory variability, suggesting right atrial pressure of 3 mmHg.   Comparison(s): Compared to prior TTE in 01/2019, there is no significant  change.   Laboratory Data:  High Sensitivity Troponin:   Recent Labs  Lab 08/05/23 1936 08/05/23 2127 08/09/23 0910  TROPONINIHS 5 8 5      Chemistry Recent Labs  Lab 08/05/23 1936 08/06/23 0426 08/09/23 0910 08/09/23 0924  NA 135 137 135  --   K 3.4* 3.6 3.6  --   CL 99 101 98  --   CO2 25 27 26   --   GLUCOSE 109* 84 106*  --   BUN 13 11 11   --   CREATININE 0.74 0.72 0.83  --   CALCIUM 9.9 9.4 10.0  --   MG  --  1.9  --  1.9  GFRNONAA >60 >60 >60  --   ANIONGAP  11 9 11   --      Recent Labs  Lab 08/05/23 1936 08/06/23 0426 08/09/23 0910  PROT 8.1 6.3* 7.6  ALBUMIN 5.0 4.0 4.7  AST 27 21 26   ALT 23 18 25   ALKPHOS 88 68 84  BILITOT 0.8 0.9 1.0   Lipids No results for input(s): "CHOL", "TRIG", "HDL", "LABVLDL", "LDLCALC", "CHOLHDL" in the last 168 hours.  Hematology Recent Labs  Lab 08/05/23 1936 08/06/23 0426 08/09/23 0910  WBC 6.3 6.6 5.0  RBC 4.21 3.83* 4.15  HGB 13.9 12.3 13.6  HCT 40.8 37.6 40.5  MCV 96.9 98.2 97.6  MCH 33.0 32.1 32.8  MCHC 34.1 32.7 33.6  RDW 12.4 12.4 12.3  PLT 159 152 158   Thyroid  Recent Labs  Lab 08/06/23 0426  TSH 2.270    BNPNo results for input(s): "BNP", "PROBNP" in the last 168 hours.  DDimer No results for input(s): "DDIMER" in the last 168 hours.   Radiology/Studies:  DG Chest Port 1 View  Result Date: 08/05/2023 CLINICAL DATA:  Atrial fibrillation EXAM: PORTABLE CHEST 1 VIEW COMPARISON:  02/08/2019 FINDINGS: Lungs are clear.  No pleural effusion or pneumothorax. The heart is normal in size.  Thoracic aortic atherosclerosis. IMPRESSION: No acute cardiopulmonary disease. Electronically Signed   By: Charline Bills M.D.   On: 08/05/2023 20:35     Assessment and Plan:   1.  Paroxysmal Atrial Fibrillation/Flutter - She had a prior ablation in 05/2019 but had recurrent atrial fibrillation/flutter earlier this week and did convert back to normal sinus rhythm with IV Cardizem. Presented back this morning with recurrent palpitations and found to be in atrial flutter with RVR. She has been on Flecainide 50 mg twice daily but previously had QRS and PR interval widening on higher doses in the past. Will review with EP in regards to antiarrhythmic options. May ultimately be a repeat ablation candidate. Given her elevated rates at this time, will order IV Lopressor 5 mg x 1. - No reports of active bleeding. Continue Xarelto for anticoagulation.  Addendum: Reviewed with Dr. Elberta Fortis and he recommended starting  Amiodarone if felt to be an ablation candidate and for her to see him back in the clinic to discuss ablation further. Since she did take Flecainide this morning, he recommended starting Amiodarone tomorrow.  2. HTN - SBP peaked in the 170's while in the ED but had improved into the 130's on most recent check.  Would continue Hydralazine and Olmesartan on admission. Can also use Amlodipine unless needing to initiate Cardizem for rate control in the interim.  3. History of CVA - She has not been on aspirin given the need for anticoagulation and has not been on statin therapy as LDL was at 63 in 01/2023.  Risk Assessment/Risk Scores:   CHA2DS2-VASc Score = 5   This indicates a 7.2% annual risk of stroke. The patient's score is based upon: CHF History: 0 HTN History: 1 Diabetes History: 0 Stroke History: 2 Vascular Disease History: 0 Age Score: 1 Gender Score: 1    For questions or updates, please contact [facility] Please consult [email or web address] for contact info under    Signed, Ellsworth Lennox, PA-C  08/09/2023 10:57 AM   Attending Note  Patient seen and discussed with PA Iran Ouch, I agree with her documentation.   72 yo female history of afib with prior ablation 05/2019, aflutter, prior CVA, HTN presented with recurrent palpitations.    1.Afib/aflutter  - history of prior ablations. On flecanide  and metoprolol at home - prior admit just a few days ago with EKG more consistent with aflutter with RVR. Hypokalemia at the time and converted on her home, electrolytes were optimzied and discharged home on prior regimen  - presents again today with recurrent palpitations, recurrent aflutter.  - From notes prolonged PR and QRS on flecanide 100mg  bid in the past. Tolerated 50mg  bid.   - with recurrent arrhythmias case discussed with EP who have recommended discontinuing flecanide today, starting and loading IV amiodarone tomorrow.  - for today can manage with dilt gtt, d/c  tomorrow when IV amio is started - continue xarelto for stroke prevention   Dina Rich MD

## 2023-08-10 DIAGNOSIS — I4891 Unspecified atrial fibrillation: Secondary | ICD-10-CM | POA: Diagnosis not present

## 2023-08-10 LAB — CBC
HCT: 38.5 % (ref 36.0–46.0)
Hemoglobin: 12.8 g/dL (ref 12.0–15.0)
MCH: 33 pg (ref 26.0–34.0)
MCHC: 33.2 g/dL (ref 30.0–36.0)
MCV: 99.2 fL (ref 80.0–100.0)
Platelets: 161 10*3/uL (ref 150–400)
RBC: 3.88 MIL/uL (ref 3.87–5.11)
RDW: 12.3 % (ref 11.5–15.5)
WBC: 5.9 10*3/uL (ref 4.0–10.5)
nRBC: 0 % (ref 0.0–0.2)

## 2023-08-10 LAB — MRSA NEXT GEN BY PCR, NASAL: MRSA by PCR Next Gen: NOT DETECTED

## 2023-08-10 LAB — COMPREHENSIVE METABOLIC PANEL
ALT: 20 U/L (ref 0–44)
AST: 20 U/L (ref 15–41)
Albumin: 3.9 g/dL (ref 3.5–5.0)
Alkaline Phosphatase: 68 U/L (ref 38–126)
Anion gap: 4 — ABNORMAL LOW (ref 5–15)
BUN: 10 mg/dL (ref 8–23)
CO2: 28 mmol/L (ref 22–32)
Calcium: 9.3 mg/dL (ref 8.9–10.3)
Chloride: 105 mmol/L (ref 98–111)
Creatinine, Ser: 0.78 mg/dL (ref 0.44–1.00)
GFR, Estimated: >60 mL/min (ref >60)
Glucose, Bld: 80 mg/dL (ref 70–99)
Potassium: 4.3 mmol/L (ref 3.5–5.1)
Sodium: 137 mmol/L (ref 135–145)
Total Bilirubin: 0.8 mg/dL (ref <1.2)
Total Protein: 6.2 g/dL — ABNORMAL LOW (ref 6.5–8.1)

## 2023-08-10 LAB — MAGNESIUM: Magnesium: 1.9 mg/dL (ref 1.7–2.4)

## 2023-08-10 LAB — PHOSPHORUS: Phosphorus: 3.9 mg/dL (ref 2.5–4.6)

## 2023-08-10 LAB — GLUCOSE, CAPILLARY
Glucose-Capillary: 82 mg/dL (ref 70–99)
Glucose-Capillary: 114 mg/dL — ABNORMAL HIGH (ref 70–99)

## 2023-08-10 MED ORDER — HYDRALAZINE HCL 20 MG/ML IJ SOLN
10.0000 mg | Freq: Four times a day (QID) | INTRAMUSCULAR | Status: DC | PRN
  Administered 2023-08-10 – 2023-08-11 (×2): 10 mg via INTRAVENOUS
  Filled 2023-08-10 (×2): qty 1

## 2023-08-10 NOTE — Plan of Care (Signed)

## 2023-08-10 NOTE — Progress Notes (Signed)
   08/10/23 1720  TOC Brief Assessment  Insurance and Status Reviewed (HTA)  Home environment has been reviewed From Home  Prior level of function: Indpendent  Prior/Current Home Services No current home services  Social Determinants of Health Reivew SDOH reviewed no interventions necessary  Readmission risk has been reviewed Yes  Transition of care needs no transition of care needs at this time   Pt expected DC on or Tues.

## 2023-08-10 NOTE — Plan of Care (Signed)
  Problem: Clinical Measurements: Goal: Ability to maintain clinical measurements within normal limits will improve Outcome: Progressing   Problem: Activity: Goal: Risk for activity intolerance will decrease Outcome: Progressing   Problem: Safety: Goal: Ability to remain free from injury will improve Outcome: Progressing   

## 2023-08-10 NOTE — Progress Notes (Signed)
PROGRESS NOTE    Melissa [name]  ZOX:[id number] DOB: Jun 19, 1951 DOA: 08/09/2023 PCP: Carylon Perches, MD    Brief Narrative:  This 72 y.o. female with PMH significant for essential hypertension, A-fib status post ablation in 2020, presently on Xarelto and flecainide. Patient presented in the ED with c/o: chest pain associated with palpitations. She woke up with palpitations with heart rate in 150s. She describes chest pain as discomfort associated with palpitations that happens whenever she has A-fib with RVR. She has taken her medications but continued to have symptoms. She denies any shortness of breath, dizziness, syncope.  Patient was admitted on 08/05/2023 for A-fib with RVR and described the symptoms are exactly same.  On her last admission Toprol was discontinued at her discharge due to junctional beats and bradycardia.  Patient denies any sick contacts or recent travel.  She is admitted for further evaluation.  Assessment & Plan:   Principal Problem:   Atrial fibrillation with RVR (HCC) Active Problems:   History of hyperthyroidism   Essential hypertension   Osteoarthritis   Aphasia due to recent cerebrovascular accident   Severe obesity (BMI >= 40) (HCC)   Anxiety   Atrial fibrillation with RVR: Patient presented with palpitations associated with chest pain that woke her up from sleep.   EKG showed A-fib with RVR with heart rate in 150s. Patient had A-fib in the past underwent ablation in 2020 but now had recurrent A-fib last week but converted to normal sinus rhythm with IV Cardizem.  Patient has been taking flecainide 50 mg twice daily regularly. Continue Cardizem infusion. Continue flecainide and Xarelto. Patient needs electrophysiology evaluation in the morning,  Cardiology evaluation appreciated recommended amiodarone infusion  but HR down in 40's Patient may require TEE with cardioversion this admission.   Essential hypertension: Continue hydralazine, olmesartan. Hold  amlodipine as patient started on Cardizem infusion.   History of CVA: Patient is not on aspirin as patient is on anticoagulation for A-fib.   Hyperlipidemia: Patient is not on Statin. Obtain Lipid profile.   Severe obesity: Diet and exercise discussed in detail.   Anxiety Disorder: Continue Xanax as needed.     DVT prophylaxis:  Xarelto Code Status: Full code Family Communication: No family at bed side Disposition Plan:    Status is: Inpatient Remains inpatient appropriate because: Admitted for A-fib with RVR started on Cardizem and amiodarone infusion.   Consultants:  Cardiology  Procedures:Echo  Antimicrobials:  Anti-infectives (From admission, onward)    None      Subjective: Patient was seen and examined at bedside.  Overnight events noted.   Patient reports doing much better.  She denies any chest pain or shortness of breath or palpitations.   Her heart rate remains controlled.  Objective: Vitals:   08/10/23 1100 08/10/23 1200 08/10/23 1221 08/10/23 1300  BP: (!) 143/55 (!) 136/58  (!) 124/46  Pulse: (!) 53 64  (!) 51  Resp: 14 18  19   Temp:   97.9 F (36.6 C)   TempSrc:   Oral   SpO2: 98% 100%  100%  Weight:      Height:        Intake/Output Summary (Last 24 hours) at 08/10/2023 1445 Last data filed at 08/10/2023 1421 Gross per 24 hour  Intake 413.2 ml  Output --  Net 413.2 ml   Filed Weights   08/09/23 0855 08/09/23 2000  Weight: 67.1 kg 66.9 kg    Examination:  General exam: Appears calm and comfortable, not in any  acute distress. Respiratory system: CTA Bilaterally. Respiratory effort normal. RR 13 Cardiovascular system: S1 & S2 heard, RRR. No JVD, murmurs, rubs, gallops or clicks. No pedal edema. Gastrointestinal system: Abdomen is non distended, soft and non tender. Normal bowel sounds heard. Central nervous system: Alert and oriented x 3. No focal neurological deficits. Extremities: Symmetric 5 x 5 power. Skin: No rashes, lesions  or ulcers Psychiatry: Judgement and insight appear normal. Mood & affect appropriate.     Data Reviewed: I have personally reviewed following labs and imaging studies  CBC: Recent Labs  Lab 08/05/23 1936 08/06/23 0426 08/09/23 0910 08/10/23 0508  WBC 6.3 6.6 5.0 5.9  NEUTROABS  --  4.4 3.3  --   HGB 13.9 12.3 13.6 12.8  HCT 40.8 37.6 40.5 38.5  MCV 96.9 98.2 97.6 99.2  PLT 159 152 158 161   Basic Metabolic Panel: Recent Labs  Lab 08/05/23 1936 08/06/23 0426 08/09/23 0910 08/09/23 0924 08/10/23 0508  NA 135 137 135  --  137  K 3.4* 3.6 3.6  --  4.3  CL 99 101 98  --  105  CO2 25 27 26   --  28  GLUCOSE 109* 84 106*  --  80  BUN 13 11 11   --  10  CREATININE 0.74 0.72 0.83  --  0.78  CALCIUM 9.9 9.4 10.0  --  9.3  MG  --  1.9  --  1.9 1.9  PHOS  --   --   --   --  3.9   GFR: Estimated Creatinine Clearance: 60.9 mL/min (by C-G formula based on SCr of 0.78 mg/dL). Liver Function Tests: Recent Labs  Lab 08/05/23 1936 08/06/23 0426 08/09/23 0910 08/10/23 0508  AST 27 21 26 20   ALT 23 18 25 20   ALKPHOS 88 68 84 68  BILITOT 0.8 0.9 1.0 0.8  PROT 8.1 6.3* 7.6 6.2*  ALBUMIN 5.0 4.0 4.7 3.9   No results for input(s): "LIPASE", "AMYLASE" in the last 168 hours. No results for input(s): "AMMONIA" in the last 168 hours. Coagulation Profile: No results for input(s): "INR", "PROTIME" in the last 168 hours. Cardiac Enzymes: No results for input(s): "CKTOTAL", "CKMB", "CKMBINDEX", "TROPONINI" in the last 168 hours. BNP (last 3 results) No results for input(s): "PROBNP" in the last 8760 hours. HbA1C: No results for input(s): "HGBA1C" in the last 72 hours. CBG: Recent Labs  Lab 08/10/23 0742 08/10/23 1148  GLUCAP 82 114*   Lipid Profile: No results for input(s): "CHOL", "HDL", "LDLCALC", "TRIG", "CHOLHDL", "LDLDIRECT" in the last 72 hours. Thyroid Function Tests: No results for input(s): "TSH", "T4TOTAL", "FREET4", "T3FREE", "THYROIDAB" in the last 72  hours. Anemia Panel: No results for input(s): "VITAMINB12", "FOLATE", "FERRITIN", "TIBC", "IRON", "RETICCTPCT" in the last 72 hours. Sepsis Labs: No results for input(s): "PROCALCITON", "LATICACIDVEN" in the last 168 hours.  Recent Results (from the past 240 hour(s))  MRSA Next Gen by PCR, Nasal     Status: None   Collection Time: 08/05/23 10:50 PM   Specimen: Nasal Mucosa; Nasal Swab  Result Value Ref Range Status   MRSA by PCR Next Gen NOT DETECTED NOT DETECTED Final    Comment: (NOTE) The GeneXpert MRSA Assay (FDA approved for NASAL specimens only), is one component of a comprehensive MRSA colonization surveillance program. It is not intended to diagnose MRSA infection nor to guide or monitor treatment for MRSA infections. Test performance is not FDA approved in patients less than 35 years old. Performed at [facility],  [address] [place], [place] [address]   MRSA Next Gen by PCR, Nasal     Status: None   Collection Time: 08/09/23  8:16 PM   Specimen: Nasal Mucosa; Nasal Swab  Result Value Ref Range Status   MRSA by PCR Next Gen NOT DETECTED NOT DETECTED Final    Comment: (NOTE) The GeneXpert MRSA Assay (FDA approved for NASAL specimens only), is one component of a comprehensive MRSA colonization surveillance program. It is not intended to diagnose MRSA infection nor to guide or monitor treatment for MRSA infections. Test performance is not FDA approved in patients less than 16 years old. Performed at [facility], [address], [place], [place] [address]     Radiology Studies: No results found.  Scheduled Meds:  Chlorhexidine Gluconate Cloth  6 each Topical Q0600   docusate sodium  100 mg Oral BID   hydrALAZINE  10 mg Oral TID   irbesartan  300 mg Oral Daily   metoprolol succinate  12.5 mg Oral QHS   rivaroxaban  20 mg Oral Q supper   Continuous Infusions:  amiodarone Stopped (08/10/23 0981)   Followed by   amiodarone     diltiazem (CARDIZEM) infusion  Stopped (08/09/23 2249)     LOS: 1 day    Time spent: 35 mins    Willeen Niece, MD [facility]   If 7PM-7AM, please contact night-coverage

## 2023-08-11 DIAGNOSIS — I4891 Unspecified atrial fibrillation: Secondary | ICD-10-CM | POA: Diagnosis not present

## 2023-08-11 DIAGNOSIS — I10 Essential (primary) hypertension: Secondary | ICD-10-CM

## 2023-08-11 DIAGNOSIS — I69320 Aphasia following cerebral infarction: Secondary | ICD-10-CM

## 2023-08-11 DIAGNOSIS — F419 Anxiety disorder, unspecified: Secondary | ICD-10-CM | POA: Diagnosis not present

## 2023-08-11 MED ORDER — METOPROLOL SUCCINATE ER 25 MG PO TB24: 25.0000 mg | ORAL_TABLET | Freq: Every day | ORAL | 2 refills | Status: DC

## 2023-08-11 MED ORDER — AMIODARONE HCL 200 MG PO TABS
200.0000 mg | ORAL_TABLET | Freq: Two times a day (BID) | ORAL | Status: DC
  Administered 2023-08-11: 200 mg via ORAL
  Filled 2023-08-11: qty 1

## 2023-08-11 MED ORDER — AMIODARONE HCL 200 MG PO TABS: ORAL_TABLET | ORAL | 1 refills | Status: DC

## 2023-08-11 MED ORDER — METOPROLOL SUCCINATE ER 25 MG PO TB24: 25.0000 mg | ORAL_TABLET | Freq: Every day | ORAL | Status: DC

## 2023-08-11 NOTE — Progress Notes (Unsigned)
Cardiology Office Note:  .   Date:  08/11/2023  ID:  Melissa [name], Melissa [name] 1951/09/01, MRN [id number] PCP: Carylon Perches, MD  [facility] Providers Cardiologist:  None Electrophysiologist:  Will Jorja Loa, MD {  History of Present Illness: .   Melissa [name] is a 72 y.o. female w/PMHx of HTN, stroke, AFib, hyperthyroidism,   She saw Dr. Elberta Fortis 11/30/19, post ablation, minimal palpitations, reported some bleeding gums, xarelto >> Eliquis  Seeing A. Tillery a few times since then, last on 12/21/22, doing well, no symptoms of Afib.  PR longer then baseline, though in review with Dr. Elberta Fortis, stable from most recent and flecainide continued BP elevated again in clinic though repots better at home  Admitted 08/05/23 with rapid palpitations that persisted despite an extra flecainide dose. Discussed intentional weight loss of 30+ lbs by diet Started on dilt gtt and had subsequent spontaneous conversion She was discharged the following day 08/06/23 Unchanged meds  Re-admitted 08/09/23 with recurrent rapid HRs, found in flutter 110s-120s flecainide stopped >> amiodarone in d/w Dr. Elberta Fortis ***  Discharged ***  Today's visit is scheduled as a 6 mo visit  ROS: ***  *** xarelto, bleeding, dose, labs *** amiodarone labs *** burden *** TSH  Arrhythmia/AAD hx PVI ablation 05/08/2019 Flecainde required dose reduction for interval lengthening > stopped Nov 2024 with recurrent arrhythmia Amiodarone started Nov 2024 ***  Studies Reviewed: Marland Kitchen    EKG done today and reviewed by myself:  ***   09/13/21: TTE 1. Left ventricular ejection fraction, by estimation, is 60 to 65%. The  left ventricle has normal function. The left ventricle has no regional  wall motion abnormalities. There is mild asymmetric left ventricular  hypertrophy of the basal-septal segment.  Left ventricular diastolic parameters are consistent with Grade I  diastolic dysfunction (impaired relaxation).   2. Right  ventricular systolic function is normal. The right ventricular  size is normal. There is mildly elevated pulmonary artery systolic  pressure. The estimated right ventricular systolic pressure is 38.0 mmHg.   3. The mitral valve is normal in structure. Trivial mitral valve  regurgitation.   4. The aortic valve is tricuspid. There is mild thickening of the aortic  valve. Aortic valve regurgitation is not visualized. Aortic valve  sclerosis is present, with no evidence of aortic valve stenosis.   5. The inferior vena cava is normal in size with greater than 50%  respiratory variability, suggesting right atrial pressure of 3 mmHg.   Comparison(s): Compared to prior TTE in 01/2019, there is no significant  change.   05/08/2019: EPS, ablation CONCLUSIONS: 1. Sinus rhythm upon presentation.   2. Successful electrical isolation and anatomical encircling of all four pulmonary veins with radiofrequency current. 3. No inducible arrhythmias following ablation both on and off of dobutamine 4. No early apparent complications.  Risk Assessment/Calculations:    Physical Exam:   VS:  There were no vitals taken for this visit.   Wt Readings from Last 3 Encounters:  08/09/23 147 lb 7.8 oz (66.9 kg)  08/06/23 148 lb 13 oz (67.5 kg)  12/21/22 184 lb (83.5 kg)    GEN: Well nourished, well developed in no acute distress NECK: No JVD; No carotid bruits CARDIAC: ***RRR, no murmurs, rubs, gallops RESPIRATORY:  *** CTA b/l without rales, wheezing or rhonchi  ABDOMEN: Soft, non-tender, non-distended EXTREMITIES:  No edema; No deformity    ASSESSMENT AND PLAN: .    paroxysmal AFib CHA2DS2Vasc is 5, on Xarelto, *** appropriately dosed ***  HTN ***  Secondary hypercoagulable state 2/2 AFib     {Are you ordering a CV Procedure (e.g. stress test, cath, DCCV, TEE, etc)?   Press F2        :[id number]}     Dispo: ***  Signed, Sheilah Pigeon, PA-C

## 2023-08-11 NOTE — Progress Notes (Signed)
Spoke with Gwenlyn Perking MD regarding patients BP and discharge. Pt currently hypertensive 190s systolic despite afternoon antihypertensives and antianxiety. Per MD ok to proceed with prn IV Hydralazine and continue with discharge with understanding that patient will resume at home antihypertensives.

## 2023-08-11 NOTE — Discharge Summary (Signed)
Physician Discharge Summary   Patient: Melissa [name] MRN: [id number] DOB: 1951/02/25  Admit date:     08/09/2023  Discharge date: 08/11/23  Discharge Physician: Vassie Loll   PCP: Melissa Perches, MD   Recommendations at discharge:  Repeat CBC to follow hemoglobin trend/stability Repeat basic metabolic panel to follow ultralights renal function Repeat magnesium level to assess stability Make sure patient follow-up with cardiology service as instructed. Reassess blood pressure and adjust antihypertensive regimen.  Discharge Diagnoses: Principal Problem:   Atrial fibrillation with RVR (HCC)   History of hyperthyroidism   Essential hypertension   Osteoarthritis   Aphasia due to recent cerebrovascular accident   Anxiety  Brief Narrative:  This 72 y.o. female with PMH significant for essential hypertension, A-fib status post ablation in 2020, presently on Xarelto and flecainide. Patient presented in the ED with c/o: chest pain associated with palpitations. She woke up with palpitations with heart rate in 150s. She describes chest pain as discomfort associated with palpitations that happens whenever she has A-fib with RVR. She has taken her medications but continued to have symptoms. She denies any shortness of breath, dizziness, syncope.  Patient was admitted on 08/05/2023 for A-fib with RVR and described the symptoms are exactly same.  On her last admission Toprol was discontinued at her discharge due to junctional beats and bradycardia.  Patient denies any sick contacts or recent travel.  She is admitted for further evaluation.  Assessment and Plan: 1-A-fib with RVR -Patient reports palpitations and chest discomfort for that woke her up from sleep; EKG showing A-fib with RVR and heart rate in the 150s. -Case discussed with cardiology service and EP; decision made to discontinue flecainide and instead treat with amiodarone and adjust the dose of metoprolol. -Transient use of Cardizem drip  while hospitalized was provided with excellent response. -At discharge rate controlled atrial fibrillation/atrial flutter.  2-essential hypertension -Stable and well-controlled -Continue treatment with hydralazine, olmesartan and metoprolol. -Heart healthy diet discussed with patient.  3-history of CVA -Continue Xarelto for secondary prevention.  4-hyperlipidemia -Continue statin.  5-obesity, class I -Patient has lost significant amount of weight and current BMI at around 23. -Instructed to maintain adequate nutrition, hydration and low calorie diet.  6-anxiety disorder -Continue as needed Xanax.  Consultants: Cardiology service. Procedures performed: See below for x-ray reports. Disposition: Home Diet recommendation: Heart healthy/low-sodium diet.  DISCHARGE MEDICATION: Allergies as of 08/11/2023       Reactions   Chloromycetin [chloramphenicol] Nausea And Vomiting   Pt states this "makes her sick"        Medication List     STOP taking these medications    amLODipine 5 MG tablet Commonly known as: NORVASC   flecainide 50 MG tablet Commonly known as: TAMBOCOR       TAKE these medications    acetaminophen 500 MG tablet Commonly known as: TYLENOL Take 500 mg by mouth every 6 (six) hours as needed (pain).   ALPRAZolam 0.25 MG tablet Commonly known as: XANAX Take 1 tablet (0.25 mg total) by mouth 3 (three) times daily as needed for anxiety or sleep.   amiodarone 200 MG tablet Commonly known as: PACERONE Take 1 tablet by mouth twice a day x 3 weeks; then 1 tablet by mouth daily.   hydrALAZINE 10 MG tablet Commonly known as: APRESOLINE Take 1 tablet (10 mg total) by mouth 3 (three) times daily.   metoprolol succinate 25 MG 24 hr tablet Commonly known as: Toprol XL Take 1 tablet (25 mg total)  by mouth at bedtime. Please keep upcoming appointment 11/24 with provider for future refills.Thank you What changed: how much to take   multivitamin with  minerals tablet Take 1 tablet by mouth daily.   olmesartan 40 MG tablet Commonly known as: BENICAR Take 40 mg by mouth every evening.   rivaroxaban 20 MG Tabs tablet Commonly known as: XARELTO Take 1 tablet (20 mg total) by mouth daily with supper.        Follow-up Information     Melissa Perches, MD. Schedule an appointment as soon as possible for a visit in 10 day(s).   Specialty: Internal Medicine Contact information: [address] [place] [address] [phone]                Discharge Exam: Ceasar Mons Weights   08/09/23 0855 08/09/23 2000  Weight: 67.1 kg 66.9 kg   General exam: Alert, awake, oriented x 3 Respiratory system: Clear to auscultation. Respiratory effort normal. Cardiovascular system: Rate controlled, no rubs, no gallops, no JVD. Gastrointestinal system: Abdomen is nondistended, soft and nontender. No organomegaly or masses felt. Normal bowel sounds heard. Central nervous system: Alert and oriented. No focal neurological deficits. Extremities: No C/C/E, +pedal pulses Skin: No rashes, lesions or ulcers Psychiatry: Judgement and insight appear normal. Mood & affect appropriate.    Condition at discharge: Stable and improved.  The results of significant diagnostics from this hospitalization (including imaging, microbiology, ancillary and laboratory) are listed below for reference.   Imaging Studies: DG Chest Port 1 View  Result Date: 08/05/2023 CLINICAL DATA:  Atrial fibrillation EXAM: PORTABLE CHEST 1 VIEW COMPARISON:  02/08/2019 FINDINGS: Lungs are clear.  No pleural effusion or pneumothorax. The heart is normal in size.  Thoracic aortic atherosclerosis. IMPRESSION: No acute cardiopulmonary disease. Electronically Signed   By: Charline Bills M.D.   On: 08/05/2023 20:35    Microbiology: Results for orders placed or performed during the hospital encounter of 08/09/23  MRSA Next Gen by PCR, Nasal     Status: None   Collection Time:  08/09/23  8:16 PM   Specimen: Nasal Mucosa; Nasal Swab  Result Value Ref Range Status   MRSA by PCR Next Gen NOT DETECTED NOT DETECTED Final    Comment: (NOTE) The GeneXpert MRSA Assay (FDA approved for NASAL specimens only), is one component of a comprehensive MRSA colonization surveillance program. It is not intended to diagnose MRSA infection nor to guide or monitor treatment for MRSA infections. Test performance is not FDA approved in patients less than 41 years old. Performed at [facility], [address], [place], [place] [address]     Labs: CBC: Recent Labs  Lab 08/05/23 1936 08/06/23 0426 08/09/23 0910 08/10/23 0508  WBC 6.3 6.6 5.0 5.9  NEUTROABS  --  4.4 3.3  --   HGB 13.9 12.3 13.6 12.8  HCT 40.8 37.6 40.5 38.5  MCV 96.9 98.2 97.6 99.2  PLT 159 152 158 161   Basic Metabolic Panel: Recent Labs  Lab 08/05/23 1936 08/06/23 0426 08/09/23 0910 08/09/23 0924 08/10/23 0508  NA 135 137 135  --  137  K 3.4* 3.6 3.6  --  4.3  CL 99 101 98  --  105  CO2 25 27 26   --  28  GLUCOSE 109* 84 106*  --  80  BUN 13 11 11   --  10  CREATININE 0.74 0.72 0.83  --  0.78  CALCIUM 9.9 9.4 10.0  --  9.3  MG  --  1.9  --  1.9 1.9  PHOS  --   --   --   --  3.9   Liver Function Tests: Recent Labs  Lab 08/05/23 1936 08/06/23 0426 08/09/23 0910 08/10/23 0508  AST 27 21 26 20   ALT 23 18 25 20   ALKPHOS 88 68 84 68  BILITOT 0.8 0.9 1.0 0.8  PROT 8.1 6.3* 7.6 6.2*  ALBUMIN 5.0 4.0 4.7 3.9   CBG: Recent Labs  Lab 08/10/23 0742 08/10/23 1148  GLUCAP 82 114*    Discharge time spent: greater than 30 minutes.  Signed: Vassie Loll, MD [facility] 08/11/2023

## 2023-08-13 ENCOUNTER — Encounter: Payer: Self-pay | Admitting: Physician Assistant

## 2023-08-13 ENCOUNTER — Ambulatory Visit: Payer: PPO | Attending: Cardiology | Admitting: Physician Assistant

## 2023-08-13 VITALS — BP 148/70 | HR 48 | Ht 67.0 in | Wt 151.6 lb

## 2023-08-13 DIAGNOSIS — D6869 Other thrombophilia: Secondary | ICD-10-CM

## 2023-08-13 DIAGNOSIS — I10 Essential (primary) hypertension: Secondary | ICD-10-CM

## 2023-08-13 DIAGNOSIS — I480 Paroxysmal atrial fibrillation: Secondary | ICD-10-CM

## 2023-08-13 DIAGNOSIS — Z79899 Other long term (current) drug therapy: Secondary | ICD-10-CM | POA: Diagnosis not present

## 2023-08-13 MED ORDER — AMIODARONE HCL 200 MG PO TABS: ORAL_TABLET | ORAL | 11 refills | Status: DC

## 2023-08-13 MED ORDER — METOPROLOL SUCCINATE ER 25 MG PO TB24: 12.5000 mg | ORAL_TABLET | Freq: Every day | ORAL | 3 refills | Status: DC

## 2023-08-13 NOTE — Patient Instructions (Addendum)
Medication Instructions:  Your physician has recommended you make the following change in your medication:  DECREASE METOPROLOL 12.5 MG  AT BEDTIME *If you need a refill on your cardiac medications before your next appointment, please call your pharmacy*   Lab Work: NONE If you have labs (blood work) drawn today and your tests are completely normal, you will receive your results only by: MyChart Message (if you have MyChart) OR A paper copy in the mail If you have any lab test that is abnormal or we need to change your treatment, we will call you to review the results.   Testing/Procedures: NONE   Follow-Up: At [facility], you and your health needs are our priority.  As part of our continuing mission to provide you with exceptional heart care, we have created designated Provider Care Teams.  These Care Teams include your primary Cardiologist (physician) and Advanced Practice Providers (APPs -  Physician Assistants and Nurse Practitioners) who all work together to provide you with the care you need, when you need it.  We recommend signing up for the patient portal called "MyChart".  Sign up information is provided on this After Visit Summary.  MyChart is used to connect with patients for Virtual Visits (Telemedicine).  Patients are able to view lab/test results, encounter notes, upcoming appointments, etc.  Non-urgent messages can be sent to your provider as well.   To learn more about what you can do with MyChart, go to [email or web address].    Your next appointment:   3 week(s)  Provider:   You will see one of the following Advanced Practice Providers on your designated Care Team:   Francis Dowse, [place] Casimiro Needle "Mardelle Matte" [place], PA-C Sherie Don, NP Canary Brim, NP  Other Instructions REPORT: HEART RATE CONSISTENTLY LESS THAN 50 AND BLOOD PRESSURE CONSISTENTLY HIGHER 150/90

## 2023-08-19 DIAGNOSIS — I10 Essential (primary) hypertension: Secondary | ICD-10-CM | POA: Diagnosis not present

## 2023-08-19 DIAGNOSIS — I480 Paroxysmal atrial fibrillation: Secondary | ICD-10-CM | POA: Diagnosis not present

## 2023-09-02 NOTE — Progress Notes (Unsigned)
Electrophysiology Office Note:   Date:  09/03/2023  ID:  Melissa [name], Melissa [name] Oct 11, 1950, MRN [id number]  Primary Cardiologist: None Electrophysiologist: Will Jorja Loa, MD      History of Present Illness:   Melissa [name] is a 72 y.o. female with h/o AF s/p PVI ablation, HTN, CVA, hyperthyroidism seen today for routine electrophysiology followup.   Admit 11/4 with AFwRVR. Toprol discontinued due to junctional / brady.  Re-admit 11/8-11/14/24 for palpitations & chest discomfort. EKG showed AFwRVR. Flecainide stopped. Amiodarone initiated and Toprol at reduced dose. She was discharged in rate controlled AF.  Follow appt 08/13/23 in EP Clinic & Toprol was reduced to 12.5mg  at bedtime.  Since last being seen in our clinic the patient reports she is doing well on current regimen. She has not had any breakthrough known AF.  Her blood pressure was elevated and her PCP started her on amlodipine 7.5mg  daily which has improved control.  She remains active / independent of all ADL's.  She denies chest pain, palpitations, dyspnea, PND, orthopnea, nausea, vomiting, dizziness, syncope, edema, weight gain, or early satiety.   Review of systems complete and found to be negative unless listed in HPI.   EP Information / Studies Reviewed:    EKG is ordered today. Personal review as below.  EKG Interpretation Date/Time:  Tuesday September 03 2023 09:11:37 EST Ventricular Rate:  54 PR Interval:    QRS Duration:  82 QT Interval:  474 QTC Calculation: 449 R Axis:   11  Text Interpretation: Sinus bradycardia Low voltage QRS Confirmed by Canary Brim ([id number]) on 09/03/2023 9:28:16 AM   Studies:  05/2019 EPS > SR on presentation, successful electrical isolation and anatomical encircling of all four pulmonary veins with radiofrequency current. No inducible arrhythmias following ablation both on and off of dobutamine ECHO 08/2021 > LVEF 60-65%, G1DD, mildly elevated PA systolic pressure, RVSP 38  mmHg   Arrhythmia / AAD Atrial Fibrillation  Flecainide > required dose reduction due to interval lengthening, stopped 08/2023 with recurrent arrhythmia Amiodarone 08/2023 >     Risk Assessment/Calculations:    CHA2DS2-VASc Score = 5   This indicates a 7.2% annual risk of stroke. The patient's score is based upon: CHF History: 0 HTN History: 1 Diabetes History: 0 Stroke History: 2 Vascular Disease History: 0 Age Score: 1 Gender Score: 1             Physical Exam:   VS:  BP 130/60   Pulse (!) 54   Ht 5' 7.5" (1.715 m)   Wt 155 lb 3.2 oz (70.4 kg)   SpO2 99%   BMI 23.95 kg/m    Wt Readings from Last 3 Encounters:  09/03/23 155 lb 3.2 oz (70.4 kg)  08/13/23 151 lb 9.6 oz (68.8 kg)  08/09/23 147 lb 7.8 oz (66.9 kg)     GEN: Well nourished, well developed in no acute distress NECK: No JVD; No carotid bruits CARDIAC: Regular rate and rhythm, no murmurs, rubs, gallops RESPIRATORY:  Clear to auscultation without rales, wheezing or rhonchi  ABDOMEN: Soft, non-tender, non-distended EXTREMITIES:  No edema; No deformity   ASSESSMENT AND PLAN:    Paroxysmal AF CHA2DS2-VASc 5. Preserved LV function.  -continue OAC as below for stroke prophylaxis  -continue metoprolol, amiodarone  -EKG with stable intervals, SB -no burden of AF symptoms on current regimen -follow up amiodarone labs > TSH, T4, CMP -suspect she would be a good candidate for consideration of re-do ablation, reviewed with Dr. Elberta Fortis.  Secondary Hypercoagulable State  -continue Xarelto, appropriately dosed for CrCl of 20mL/min  Asymptomatic Bradycardia  -metoprolol reduced 08/2023 to 12.5mg  at bedtime   Hypertension  -well controlled on current regimen    Follow up with Dr. Elberta Fortis 12/27 at 9:15 am to further discuss ablation.   Signed, Canary Brim, MSN, APRN, NP-C, AGACNP-BC [facility] - Electrophysiology  09/03/2023, 9:57 AM

## 2023-09-03 ENCOUNTER — Encounter: Payer: Self-pay | Admitting: Pulmonary Disease

## 2023-09-03 ENCOUNTER — Ambulatory Visit: Payer: PPO | Attending: Pulmonary Disease | Admitting: Pulmonary Disease

## 2023-09-03 VITALS — BP 130/60 | HR 54 | Ht 67.5 in | Wt 155.2 lb

## 2023-09-03 DIAGNOSIS — I10 Essential (primary) hypertension: Secondary | ICD-10-CM | POA: Diagnosis not present

## 2023-09-03 DIAGNOSIS — I480 Paroxysmal atrial fibrillation: Secondary | ICD-10-CM | POA: Diagnosis not present

## 2023-09-03 DIAGNOSIS — D6869 Other thrombophilia: Secondary | ICD-10-CM

## 2023-09-03 NOTE — Addendum Note (Signed)
Addended by: Sherle Poe R on: 09/03/2023 10:04 AM   Modules accepted: Orders

## 2023-09-03 NOTE — Patient Instructions (Addendum)
Medication Instructions:  Your physician recommends that you continue on your current medications as directed. Please refer to the Current Medication list given to you today. *If you need a refill on your cardiac medications before your next appointment, please call your pharmacy*   Lab Work: CMP and TSH TODAY If you have labs (blood work) drawn today and your tests are completely normal, you will receive your results only by: MyChart Message (if you have MyChart) OR A paper copy in the mail If you have any lab test that is abnormal or we need to change your treatment, we will call you to review the results.   Follow-Up: At [facility], you and your health needs are our priority.  As part of our continuing mission to provide you with exceptional heart care, we have created designated Provider Care Teams.  These Care Teams include your primary Cardiologist (physician) and Advanced Practice Providers (APPs -  Physician Assistants and Nurse Practitioners) who all work together to provide you with the care you need, when you need it.  We recommend signing up for the patient portal called "MyChart".  Sign up information is provided on this After Visit Summary.  MyChart is used to connect with patients for Virtual Visits (Telemedicine).  Patients are able to view lab/test results, encounter notes, upcoming appointments, etc.  Non-urgent messages can be sent to your provider as well.   To learn more about what you can do with MyChart, go to [email or web address].    Your next appointment:   09/27/23 at 9:15 am  Provider:   Loman Brooklyn, MD

## 2023-09-04 LAB — COMPREHENSIVE METABOLIC PANEL
ALT: 14 [IU]/L (ref 0–32)
AST: 21 [IU]/L (ref 0–40)
Albumin: 4.2 g/dL (ref 3.8–4.8)
Alkaline Phosphatase: 86 [IU]/L (ref 44–121)
BUN/Creatinine Ratio: 15 (ref 12–28)
BUN: 14 mg/dL (ref 8–27)
Bilirubin Total: 0.9 mg/dL (ref 0.0–1.2)
CO2: 29 mmol/L (ref 20–29)
Calcium: 9.4 mg/dL (ref 8.7–10.3)
Chloride: 102 mmol/L (ref 96–106)
Creatinine, Ser: 0.91 mg/dL (ref 0.57–1.00)
Globulin, Total: 2 g/dL (ref 1.5–4.5)
Glucose: 71 mg/dL (ref 70–99)
Potassium: 4.8 mmol/L (ref 3.5–5.2)
Sodium: 140 mmol/L (ref 134–144)
Total Protein: 6.2 g/dL (ref 6.0–8.5)
eGFR: 67 mL/min/{1.73_m2} (ref >59)

## 2023-09-04 LAB — TSH RFX ON ABNORMAL TO FREE T4: TSH: 2.98 u[IU]/mL (ref 0.450–4.500)

## 2023-09-27 ENCOUNTER — Ambulatory Visit: Payer: PPO | Attending: Cardiology | Admitting: Cardiology

## 2023-09-27 ENCOUNTER — Encounter: Payer: Self-pay | Admitting: Cardiology

## 2023-09-27 VITALS — BP 148/70 | HR 58 | Ht 67.5 in | Wt 155.2 lb

## 2023-09-27 DIAGNOSIS — D6869 Other thrombophilia: Secondary | ICD-10-CM

## 2023-09-27 DIAGNOSIS — I10 Essential (primary) hypertension: Secondary | ICD-10-CM

## 2023-09-27 DIAGNOSIS — I480 Paroxysmal atrial fibrillation: Secondary | ICD-10-CM | POA: Diagnosis not present

## 2023-09-27 NOTE — Progress Notes (Signed)
Electrophysiology Office Note:   Date:  09/27/2023  ID:  Melissa [name] 05-Feb-1951, MRN [id number]  Primary Cardiologist: None Primary Heart Failure: None Electrophysiologist: Jaley Yan Jorja Loa, MD      History of Present Illness:   Melissa [name] is a 72 y.o. female with h/o atrial fibrillation, CVA, hypertension seen today for routine electrophysiology followup.   She is post atrial fibrillation ablation.  She was admitted to the hospital in November 2020 for palpitations and chest discomfort and was found to be in rapid atrial fibrillation.  She was started on amiodarone.  Since last being seen in our clinic the patient reports feeling well currently.  She felt palpitations, fatigue, shortness of breath when she was in atrial fibrillation.  She has noted no further episodes of atrial fibrillation since being loaded with amiodarone..  she denies chest pain, palpitations, dyspnea, PND, orthopnea, nausea, vomiting, dizziness, syncope, edema, weight gain, or early satiety.   Review of systems complete and found to be negative unless listed in HPI.   EP Information / Studies Reviewed:    EKG is not ordered today. EKG from 08/05/2023 reviewed which showed atrial fibrillation        Risk Assessment/Calculations:    CHA2DS2-VASc Score = 5   This indicates a 7.2% annual risk of stroke. The patient's score is based upon: CHF History: 0 HTN History: 1 Diabetes History: 0 Stroke History: 2 Vascular Disease History: 0 Age Score: 1 Gender Score: 1            Physical Exam:   VS:  BP (!) 148/70 (BP Location: Right Arm, Patient Position: Sitting, Cuff Size: Normal)   Pulse (!) 58   Ht 5' 7.5" (1.715 m)   Wt 155 lb 3.2 oz (70.4 kg)   SpO2 99%   BMI 23.95 kg/m    Wt Readings from Last 3 Encounters:  09/27/23 155 lb 3.2 oz (70.4 kg)  09/03/23 155 lb 3.2 oz (70.4 kg)  08/13/23 151 lb 9.6 oz (68.8 kg)     GEN: Well nourished, well developed in no acute  distress NECK: No JVD; No carotid bruits CARDIAC: Regular rate and rhythm, no murmurs, rubs, gallops RESPIRATORY:  Clear to auscultation without rales, wheezing or rhonchi  ABDOMEN: Soft, non-tender, non-distended EXTREMITIES:  No edema; No deformity   ASSESSMENT AND PLAN:    1.  Paroxysmal atrial fibrillation: Currently on metoprolol and amiodarone.  She would like for amiodarone to be a short-term medication due to long-term side effects.  Due to that, we Leilynn Pilat plan for repeat ablation.  Risk and benefits have been discussed.  She understands the risks and is agreed to the procedure.  Risk, benefits, and alternatives to EP study and radiofrequency/pulse field ablation for afib were also discussed in detail today. These risks include but are not limited to stroke, bleeding, vascular damage, tamponade, perforation, damage to the esophagus, lungs, and other structures, pulmonary vein stenosis, worsening renal function, and death. The patient understands these risk and wishes to proceed.  We Onalee Steinbach therefore proceed with catheter ablation at the next available time.  Carto, ICE, anesthesia are requested for the procedure.  Sayer Masini also obtain CT PV protocol prior to the procedure to exclude LAA thrombus and further evaluate atrial anatomy.  2.  Secondary hypercoagulable state: Currently on Xarelto for atrial fibrillation  3.  Hypertension: Mildly elevated.  Usually well-controlled.  No changes.  Follow up with Dr. Elberta Fortis as usual post procedure  Signed, Harmoni Lucus Jorja Loa, MD

## 2023-09-27 NOTE — Patient Instructions (Signed)
Medication Instructions:  Your physician recommends that you continue on your current medications as directed. Please refer to the Current Medication list given to you today.  *If you need a refill on your cardiac medications before your next appointment, please call your pharmacy*   Lab Work: Pre procedure labs -- we will call you to schedule:  BMP & CBC  If you have a lab test that is abnormal and we need to change your treatment, we will call you to review the results -- otherwise no news is good news.    Testing/Procedures: Your physician has requested that you have cardiac CT 1 month PRIOR to your ablation. Cardiac computed tomography (CT) is a painless test that uses an x-ray machine to take clear, detailed pictures of your heart.  We will call you to schedule.  Your physician has recommended that you have a repeat ablation. Catheter ablation is a medical procedure used to treat some cardiac arrhythmias (irregular heartbeats). During catheter ablation, a long, thin, flexible tube is put into a blood vessel in your groin (upper thigh), or neck. This tube is called an ablation catheter. It is then guided to your heart through the blood vessel. Radio frequency waves destroy small areas of heart tissue where abnormal heartbeats may cause an arrhythmia to start.   We will call you to schedule this procedure.  It may be several weeks or more before calling.   Follow-Up: At [facility], you and your health needs are our priority.  As part of our continuing mission to provide you with exceptional heart care, we have created designated Provider Care Teams.  These Care Teams include your primary Cardiologist (physician) and Advanced Practice Providers (APPs -  Physician Assistants and Nurse Practitioners) who all work together to provide you with the care you need, when you need it.  Your next appointment:   1 month(s) after your ablation  The format for your next appointment:   In  Person  Provider:   [facility] clinic   Thank you for choosing [facility]!!   Dory Horn, RN [phone]    Other Instructions   Cardiac Ablation Cardiac ablation is a procedure to destroy (ablate) some heart tissue that is sending bad signals. These bad signals cause problems in heart rhythm. The heart has many areas that make these signals. If there are problems in these areas, they can make the heart beat in a way that is not normal. Destroying some tissues can help make the heart rhythm normal. Tell your doctor about: Any allergies you have. All medicines you are taking. These include vitamins, herbs, eye drops, creams, and over-the-counter medicines. Any problems you or family members have had with medicines that make you fall asleep (anesthetics). Any blood disorders you have. Any surgeries you have had. Any medical conditions you have, such as kidney failure. Whether you are pregnant or may be pregnant. What are the risks? This is a safe procedure. But problems may occur, including: Infection. Bruising and bleeding. Bleeding into the chest. Stroke or blood clots. Damage to nearby areas of your body. Allergies to medicines or dyes. The need for a pacemaker if the normal system is damaged. Failure of the procedure to treat the problem. What happens before the procedure? Medicines Ask your doctor about: Changing or stopping your normal medicines. This is important. Taking aspirin and ibuprofen. Do not take these medicines unless your doctor tells you to take them. Taking other medicines, vitamins, herbs, and supplements. General instructions Follow  instructions from your doctor about what you cannot eat or drink. Plan to have someone take you home from the hospital or clinic. If you will be going home right after the procedure, plan to have someone with you for 24 hours. Ask your doctor what steps will be taken to prevent infection. What happens during the  procedure?  An IV tube will be put into one of your veins. You will be given a medicine to help you relax. The skin on your neck or groin will be numbed. A cut (incision) will be made in your neck or groin. A needle will be put through your cut and into a large vein. A tube (catheter) will be put into the needle. The tube will be moved to your heart. Dye may be put through the tube. This helps your doctor see your heart. Small devices (electrodes) on the tube will send out signals. A type of energy will be used to destroy some heart tissue. The tube will be taken out. Pressure will be held on your cut. This helps stop bleeding. A bandage will be put over your cut. The exact procedure may vary among doctors and hospitals. What happens after the procedure? You will be watched until you leave the hospital or clinic. This includes checking your heart rate, breathing rate, oxygen, and blood pressure. Your cut will be watched for bleeding. You will need to lie still for a few hours. Do not drive for 24 hours or as long as your doctor tells you. Summary Cardiac ablation is a procedure to destroy some heart tissue. This is done to treat heart rhythm problems. Tell your doctor about any medical conditions you may have. Tell him or her about all medicines you are taking to treat them. This is a safe procedure. But problems may occur. These include infection, bruising, bleeding, and damage to nearby areas of your body. Follow what your doctor tells you about food and drink. You may also be told to change or stop some of your medicines. After the procedure, do not drive for 24 hours or as long as your doctor tells you. This information is not intended to replace advice given to you by your health care provider. Make sure you discuss any questions you have with your health care provider. Document Revised: 12/08/2021 Document Reviewed: 08/20/2019 Elsevier Patient Education  2023 Elsevier  Inc.   Cardiac Ablation, Care After  This sheet gives you information about how to care for yourself after your procedure. Your health care provider may also give you more specific instructions. If you have problems or questions, contact your health care provider. What can I expect after the procedure? After the procedure, it is common to have: Bruising around your puncture site. Tenderness around your puncture site. Skipped heartbeats. If you had an atrial fibrillation ablation, you may have atrial fibrillation during the first several months after your procedure.  Tiredness (fatigue).  Follow these instructions at home: Puncture site care  Follow instructions from your health care provider about how to take care of your puncture site. Make sure you: If present, leave stitches (sutures), skin glue, or adhesive strips in place. These skin closures may need to stay in place for up to 2 weeks. If adhesive strip edges start to loosen and curl up, you may trim the loose edges. Do not remove adhesive strips completely unless your health care provider tells you to do that. If a large square bandage is present, this may be removed 24  hours after surgery.  Check your puncture site every day for signs of infection. Check for: Redness, swelling, or pain. Fluid or blood. If your puncture site starts to bleed, lie down on your back, apply firm pressure to the area, and contact your health care provider. Warmth. Pus or a bad smell. A pea or small marble sized lump at the site is normal and can take up to three months to resolve.  Driving Do not drive for at least 4 days after your procedure or however long your health care provider recommends. (Do not resume driving if you have previously been instructed not to drive for other health reasons.) Do not drive or use heavy machinery while taking prescription pain medicine. Activity Avoid activities that take a lot of effort for at least 7 days after your  procedure. Do not lift anything that is heavier than 5 lb (4.5 kg) for one week.  No sexual activity for 1 week.  Return to your normal activities as told by your health care provider. Ask your health care provider what activities are safe for you. General instructions Take over-the-counter and prescription medicines only as told by your health care provider. Do not use any products that contain nicotine or tobacco, such as cigarettes and e-cigarettes. If you need help quitting, ask your health care provider. You may shower after 24 hours, but Do not take baths, swim, or use a hot tub for 1 week.  Do not drink alcohol for 24 hours after your procedure. Keep all follow-up visits as told by your health care provider. This is important. Contact a health care provider if: You have redness, mild swelling, or pain around your puncture site. You have fluid or blood coming from your puncture site that stops after applying firm pressure to the area. Your puncture site feels warm to the touch. You have pus or a bad smell coming from your puncture site. You have a fever. You have chest pain or discomfort that spreads to your neck, jaw, or arm. You have chest pain that is worse with lying on your back or taking a deep breath. You are sweating a lot. You feel nauseous. You have a fast or irregular heartbeat. You have shortness of breath. You are dizzy or light-headed and feel the need to lie down. You have pain or numbness in the arm or leg closest to your puncture site. Get help right away if: Your puncture site suddenly swells. Your puncture site is bleeding and the bleeding does not stop after applying firm pressure to the area. These symptoms may represent a serious problem that is an emergency. Do not wait to see if the symptoms will go away. Get medical help right away. Call your local emergency services (911 in the [place] Do not drive yourself to the hospital. Summary After the procedure, it  is normal to have bruising and tenderness at the puncture site in your groin, neck, or forearm. Check your puncture site every day for signs of infection. Get help right away if your puncture site is bleeding and the bleeding does not stop after applying firm pressure to the area. This is a medical emergency. This information is not intended to replace advice given to you by your health care provider. Make sure you discuss any questions you have with your health care provider.

## 2023-10-03 DIAGNOSIS — H40033 Anatomical narrow angle, bilateral: Secondary | ICD-10-CM | POA: Diagnosis not present

## 2023-10-03 DIAGNOSIS — Z79899 Other long term (current) drug therapy: Secondary | ICD-10-CM | POA: Diagnosis not present

## 2023-10-03 DIAGNOSIS — I10 Essential (primary) hypertension: Secondary | ICD-10-CM | POA: Diagnosis not present

## 2023-10-03 DIAGNOSIS — I480 Paroxysmal atrial fibrillation: Secondary | ICD-10-CM | POA: Diagnosis not present

## 2023-10-03 DIAGNOSIS — I618 Other nontraumatic intracerebral hemorrhage: Secondary | ICD-10-CM | POA: Diagnosis not present

## 2023-10-03 DIAGNOSIS — M1990 Unspecified osteoarthritis, unspecified site: Secondary | ICD-10-CM | POA: Diagnosis not present

## 2023-10-03 DIAGNOSIS — H2513 Age-related nuclear cataract, bilateral: Secondary | ICD-10-CM | POA: Diagnosis not present

## 2023-10-09 DIAGNOSIS — I10 Essential (primary) hypertension: Secondary | ICD-10-CM | POA: Diagnosis not present

## 2023-10-09 DIAGNOSIS — D696 Thrombocytopenia, unspecified: Secondary | ICD-10-CM | POA: Diagnosis not present

## 2023-10-09 DIAGNOSIS — I480 Paroxysmal atrial fibrillation: Secondary | ICD-10-CM | POA: Diagnosis not present

## 2023-10-24 DIAGNOSIS — H40053 Ocular hypertension, bilateral: Secondary | ICD-10-CM | POA: Diagnosis not present

## 2023-10-24 DIAGNOSIS — H01002 Unspecified blepharitis right lower eyelid: Secondary | ICD-10-CM | POA: Diagnosis not present

## 2023-10-24 DIAGNOSIS — H25813 Combined forms of age-related cataract, bilateral: Secondary | ICD-10-CM | POA: Diagnosis not present

## 2023-10-24 DIAGNOSIS — H01001 Unspecified blepharitis right upper eyelid: Secondary | ICD-10-CM | POA: Diagnosis not present

## 2023-10-30 ENCOUNTER — Telehealth: Payer: Self-pay | Admitting: Cardiology

## 2023-10-30 NOTE — Telephone Encounter (Signed)
Offered pt 2/20 but she cannot do that day. Held 3/21 date for now. She is seeing eye doctor on Monday b/c she needs cataract surgery and will discuss her blood thinner with them.  She will call me next week to let me know about that visit and if can proceed with ablation on 3/21 vs moving it out further. She appreciates my return call.

## 2023-10-30 NOTE — Telephone Encounter (Signed)
Pt said someone is suppose to be calling her about an Ablation. She said she hasn't heard from anyone and was just checking on the status. Please advise

## 2023-11-04 DIAGNOSIS — H01001 Unspecified blepharitis right upper eyelid: Secondary | ICD-10-CM | POA: Diagnosis not present

## 2023-11-04 DIAGNOSIS — H25813 Combined forms of age-related cataract, bilateral: Secondary | ICD-10-CM | POA: Diagnosis not present

## 2023-11-04 DIAGNOSIS — H40053 Ocular hypertension, bilateral: Secondary | ICD-10-CM | POA: Diagnosis not present

## 2023-11-04 DIAGNOSIS — H01002 Unspecified blepharitis right lower eyelid: Secondary | ICD-10-CM | POA: Diagnosis not present

## 2023-11-04 DIAGNOSIS — H01004 Unspecified blepharitis left upper eyelid: Secondary | ICD-10-CM | POA: Diagnosis not present

## 2023-11-04 DIAGNOSIS — H01005 Unspecified blepharitis left lower eyelid: Secondary | ICD-10-CM | POA: Diagnosis not present

## 2023-11-04 NOTE — Telephone Encounter (Signed)
 Pt returning call, requesting cb

## 2023-11-06 NOTE — Telephone Encounter (Signed)
Patient stated she was returning RN Sherri's call regarding scheduling her surgery.

## 2023-11-06 NOTE — Telephone Encounter (Signed)
 I called pt and let her know that Maeola is out of the office. She said she has several things going on with her eyes that she needs to get fixed so she wants to put off her Ablation for a few Months.   I put her on the calendar for May 7 at 1:30 and she is aware of this date/time but she knows we are not scheduling this until it's closer to time and she is for sure ready to have this done.   She would like for us  to reach out to her when it gets closer to that time to make sure she is ready.

## 2023-12-02 DIAGNOSIS — H25812 Combined forms of age-related cataract, left eye: Secondary | ICD-10-CM | POA: Diagnosis not present

## 2023-12-05 ENCOUNTER — Encounter (HOSPITAL_COMMUNITY)
Admission: RE | Admit: 2023-12-05 | Discharge: 2023-12-05 | Disposition: A | Discharge status: Home / Self Care | Payer: PPO | Source: Ambulatory Visit | Attending: Ophthalmology | Admitting: Ophthalmology

## 2023-12-05 NOTE — H&P (Signed)
 Surgical History & Physical  Patient Name: Melissa [name]  DOB: 07-09-1951  Surgery: Cataract extraction with intraocular lens implant phacoemulsification; Left Eye Ocular Hypertension; Left Eye Surgeon: [name] MD Surgery Date: 12/09/2023 Pre-Op Date: 10/24/2023  HPI: A 77 Yr. old female patient present for cataract eval per Dr. Barrie Folk. The patient complains of difficulty when driving due to glare and halos, which began 3 months ago. Left eye is affected. The episode is constant. The condition's severity is worsening. This is negatively affecting the patient's quality of life and the patient is unable to function adequately in life with the current level of vision. Patient has had a heart ablation 4 years ago for A-Fib. She states she is waiting for the office to call her and set up another ablation. Dr. Darrelyn Hillock his her cardiologist, he told her she can have cataract surgery when needed per patient. Fam hx glaucoma HPI Completed by Dr. [name]  Medical History: Cataracts  Heart Problem High Blood Pressure Stroke Nervousness  Review of Systems Cardiovascular High Blood Pressure, A-fib Neurological Stroke Psychiatry nervousness All recorded systems are negative except as noted above.  Social Never smoked  Medication  hydralazine ,  metoprolol succinate ,  amiodarone ,  amlodipine ,  alprazolam ,  Xarelto ,  olmesartan , Vyzulta  Sx/Procedures Heart Ablation, Knee Replacement - bilat, Hysterectomy  Drug Allergies  NKDA  History & Physical: Heent: catararcts NECK: supple without bruits LUNGS: lungs clear to auscultation CV: regular rate and rhythm Abdomen: soft and non-tender  Impression & Plan: Assessment: 1.  COMBINED FORMS AGE RELATED CATARACT; Both Eyes (H25.813) 2.  Ocular Hypertension; Both Eyes (H40.053) 3.  BLEPHARITIS; Right Upper Lid, Right Lower Lid, Left Upper Lid, Left Lower Lid (H01.001, H01.002,H01.004,H01.005)  Plan: 1.  Cataract accounts  for the patient's decreased vision. This visual impairment is not correctable with a tolerable change in glasses or contact lenses. Cataract surgery with an implantation of a new lens should significantly improve the visual and functional status of the patient. Discussed all risks, benefits, alternatives, and potential complications. Discussed the procedures and recovery. Patient desires to have surgery. A-scan ordered and performed today for intra-ocular lens calculations. The surgery will be performed in order to improve vision for driving, reading, and for eye examinations. Recommend phacoemulsification with intra-ocular lens. Recommend Dextenza for post-operative pain and inflammation. Left Eye worse - first. Dilates poorly - shugarcaine by protocol. Malyugin Ring. Omidira. Recommend goniotomy to control pressure in setting of glaucoma.  2.  New onset. OCT rNFL shows thinning OD. Recommend goniotomy (iAccess or [place] Dual Blade) both eyes at the time of cataract surgery to help lower eye pressure as well as minimize drop use in the setting of systematic cardiac disease. Start Vyzulta 1 drop every night at bedtime, both eyes  3.  Blepharitis is present - recommend regular lid cleaning.

## 2023-12-09 ENCOUNTER — Ambulatory Visit (HOSPITAL_COMMUNITY): Admitting: Anesthesiology

## 2023-12-09 ENCOUNTER — Encounter (HOSPITAL_COMMUNITY)
Admission: RE | Disposition: A | Discharge status: Home / Self Care | Payer: Self-pay | Source: Home / Self Care | Attending: Ophthalmology

## 2023-12-09 ENCOUNTER — Other Ambulatory Visit: Payer: Self-pay

## 2023-12-09 ENCOUNTER — Ambulatory Visit (HOSPITAL_BASED_OUTPATIENT_CLINIC_OR_DEPARTMENT_OTHER): Admitting: Anesthesiology

## 2023-12-09 ENCOUNTER — Ambulatory Visit (HOSPITAL_COMMUNITY)
Admission: RE | Admit: 2023-12-09 | Discharge: 2023-12-09 | Disposition: A | Discharge status: Home / Self Care | Payer: PPO | Attending: Ophthalmology | Admitting: Ophthalmology

## 2023-12-09 ENCOUNTER — Encounter (HOSPITAL_COMMUNITY): Payer: Self-pay | Admitting: Ophthalmology

## 2023-12-09 DIAGNOSIS — I10 Essential (primary) hypertension: Secondary | ICD-10-CM

## 2023-12-09 DIAGNOSIS — H40052 Ocular hypertension, left eye: Secondary | ICD-10-CM | POA: Diagnosis not present

## 2023-12-09 DIAGNOSIS — H25812 Combined forms of age-related cataract, left eye: Secondary | ICD-10-CM | POA: Diagnosis not present

## 2023-12-09 DIAGNOSIS — I4891 Unspecified atrial fibrillation: Secondary | ICD-10-CM | POA: Insufficient documentation

## 2023-12-09 DIAGNOSIS — H5712 Ocular pain, left eye: Secondary | ICD-10-CM | POA: Insufficient documentation

## 2023-12-09 DIAGNOSIS — H2181 Floppy iris syndrome: Secondary | ICD-10-CM | POA: Insufficient documentation

## 2023-12-09 DIAGNOSIS — F419 Anxiety disorder, unspecified: Secondary | ICD-10-CM

## 2023-12-09 SURGERY — CATARACT EXTRACTION PHACO AND INTRAOCULAR LENS PLACEMENT (IOC) with placement of Corticosteroid
Anesthesia: Monitor Anesthesia Care | Site: Eye | Laterality: Left

## 2023-12-09 MED ORDER — LIDOCAINE HCL 3.5 % OP GEL
1.0000 | Freq: Once | OPHTHALMIC | Status: AC
  Administered 2023-12-09: 1 via OPHTHALMIC

## 2023-12-09 MED ORDER — STERILE WATER FOR IRRIGATION IR SOLN
Status: DC | PRN
  Administered 2023-12-09: 25 mL

## 2023-12-09 MED ORDER — BSS IO SOLN
INTRAOCULAR | Status: DC | PRN
  Administered 2023-12-09: 15 mL via INTRAOCULAR

## 2023-12-09 MED ORDER — SODIUM CHLORIDE 0.9% FLUSH: 3.0000 mL | INTRAVENOUS | Status: DC | PRN

## 2023-12-09 MED ORDER — TROPICAMIDE 1 % OP SOLN
1.0000 [drp] | OPHTHALMIC | Status: AC | PRN
  Administered 2023-12-09 (×3): 1 [drp] via OPHTHALMIC

## 2023-12-09 MED ORDER — SODIUM CHLORIDE 0.9% FLUSH
INTRAVENOUS | Status: DC | PRN
  Administered 2023-12-09: 6 mL via INTRAVENOUS

## 2023-12-09 MED ORDER — LIDOCAINE HCL (PF) 1 % IJ SOLN
INTRAOCULAR | Status: DC | PRN
  Administered 2023-12-09: 1 mL via OPHTHALMIC

## 2023-12-09 MED ORDER — CHLORHEXIDINE GLUCONATE 0.12 % MT SOLN: 15.0000 mL | Freq: Once | OROMUCOSAL | Status: DC

## 2023-12-09 MED ORDER — NA HYALUR & NA CHOND-NA HYALUR 0.55-0.5 ML IO KIT
PACK | INTRAOCULAR | Status: DC | PRN
  Administered 2023-12-09: 1 via INTRAOCULAR

## 2023-12-09 MED ORDER — SODIUM HYALURONATE 23MG/ML IO SOSY
PREFILLED_SYRINGE | INTRAOCULAR | Status: DC | PRN
  Administered 2023-12-09: .6 mL via INTRAOCULAR

## 2023-12-09 MED ORDER — PHENYLEPHRINE HCL 2.5 % OP SOLN
1.0000 [drp] | OPHTHALMIC | Status: AC | PRN
  Administered 2023-12-09 (×3): 1 [drp] via OPHTHALMIC

## 2023-12-09 MED ORDER — POVIDONE-IODINE 5 % OP SOLN
OPHTHALMIC | Status: DC | PRN
  Administered 2023-12-09: 1 via OPHTHALMIC

## 2023-12-09 MED ORDER — MIDAZOLAM HCL 2 MG/2ML IJ SOLN
INTRAMUSCULAR | Status: DC | PRN
  Administered 2023-12-09: 1 mg via INTRAVENOUS

## 2023-12-09 MED ORDER — TETRACAINE HCL 0.5 % OP SOLN
1.0000 [drp] | OPHTHALMIC | Status: AC | PRN
  Administered 2023-12-09 (×3): 1 [drp] via OPHTHALMIC

## 2023-12-09 MED ORDER — MOXIFLOXACIN HCL 5 MG/ML IO SOLN
INTRAOCULAR | Status: DC | PRN
  Administered 2023-12-09: .3 mL via INTRACAMERAL

## 2023-12-09 MED ORDER — SODIUM CHLORIDE 0.9% FLUSH: 3.0000 mL | Freq: Two times a day (BID) | INTRAVENOUS | Status: DC

## 2023-12-09 MED ORDER — DEXAMETHASONE 0.4 MG OP INST
VAGINAL_INSERT | OPHTHALMIC | Status: DC | PRN
  Administered 2023-12-09: .4 mg via OPHTHALMIC

## 2023-12-09 MED ORDER — ORAL CARE MOUTH RINSE: 15.0000 mL | Freq: Once | OROMUCOSAL | Status: DC

## 2023-12-09 MED ORDER — PHENYLEPHRINE-KETOROLAC 1-0.3 % IO SOLN
INTRAOCULAR | Status: DC | PRN
  Administered 2023-12-09: 500 mL via OPHTHALMIC

## 2023-12-09 MED ORDER — MIDAZOLAM HCL 2 MG/2ML IJ SOLN
INTRAMUSCULAR | Status: AC
  Filled 2023-12-09: qty 2

## 2023-12-09 SURGICAL SUPPLY — 18 items
CATARACT SUITE SIGHTPATH (MISCELLANEOUS) ×1 IMPLANT
CLOTH BEACON ORANGE TIMEOUT ST (SAFETY) ×1 IMPLANT
EYE SHIELD UNIVERSAL CLEAR (GAUZE/BANDAGES/DRESSINGS) IMPLANT
FEE CATARACT SUITE SIGHTPATH (MISCELLANEOUS) ×1 IMPLANT
GLOVE BIOGEL PI IND STRL 6.5 (GLOVE) IMPLANT
GLOVE BIOGEL PI IND STRL 7.0 (GLOVE) ×2 IMPLANT
LENS IOL TECNIS EYHANCE 21.0 (Intraocular Lens) IMPLANT
NDL HYPO 18GX1.5 BLUNT FILL (NEEDLE) ×2 IMPLANT
NEEDLE HYPO 18GX1.5 BLUNT FILL (NEEDLE) ×1 IMPLANT
PAD ARMBOARD 7.5X6 YLW CONV (MISCELLANEOUS) ×1 IMPLANT
RING MALYGIN 7.0 (MISCELLANEOUS) IMPLANT
SIGHTPATH CAT PROC W REG LENS (Ophthalmic Related) IMPLANT
SYR TB 1ML LL NO SAFETY (SYRINGE) ×1 IMPLANT
SYS GLAUKOS IPRIME VISCODLVRY (OPHTHALMIC) ×1 IMPLANT
SYSTEM GLAUKOS IPRIME VSCDLVRY (OPHTHALMIC) IMPLANT
TAPE SURG TRANSPORE 1 IN (GAUZE/BANDAGES/DRESSINGS) IMPLANT
TREPHINE GLAUKO IACCESS TRBCLR (OPHTHALMIC) IMPLANT
WATER STERILE IRR 250ML POUR (IV SOLUTION) ×1 IMPLANT

## 2023-12-09 NOTE — Discharge Instructions (Signed)
 Please discharge patient when stable, will follow up today with Dr. June Leap at the [facility] office immediately following discharge.  Leave shield in place until visit.  All paperwork with discharge instructions will be given at the office.  [facility] Address:  [address]  [place], [place] [address]

## 2023-12-09 NOTE — Op Note (Signed)
 Date of procedure: 12/09/23  Pre-operative diagnosis: Visually significant age-related combined cataract, Left Eye (H25.812), Poor dilation of the Left eye Ocular hypertension, left eye  Post-operative diagnosis:  Visually significant age-related combined cataract, Left Eye (H25.812) Intra-operative floppy iris syndrome, Left eye (H21.81) 3.   Pain and inflammation following cataract surgery, Left Eye (H57.12) 4. Ocular hypertension, left eye  Procedure:  Complex Removal of cataract via phacoemulsification and insertion of intra-ocular lens [employer] DIB00 +21.0D into the capsular bag of the Left Eye [phone]) Goniotomy treatment of the left eye using iAccess Placement of Dextenza Implant, Left Lower Lid  Attending surgeon: Rudy Jew. June Leap, MD, MA  Anesthesia: MAC, Topical Akten  Complications: None  Estimated Blood Loss: <74mL (minimal)  Specimens: None  Implants: As above  Indications:  Visually significant age-related cataract, Left Eye; Ocular Hypertension, left eye; Pain and inflammation after cataract surgery, left eye  Procedure:  The patient was seen and identified in the pre-operative area. The operative eye was identified and dilated.  The operative eye was marked.  Topical anesthesia was administered to the operative eye.     The patient was then to the operative suite and placed in the supine position.  A timeout was performed confirming the patient, procedure to be performed, and all other relevant information.   The patient's face was prepped and draped in the usual fashion for intra-ocular surgery.  A lid speculum was placed into the operative eye and the surgical microscope moved into place and focused. Poor dilation of the iris was confirmed. An inferotemporal paracentesis was created using a 20 gauge paracentesis blade.  Shugarcaine was injected into the anterior chamber.  Viscoelastic was injected into the anterior chamber.  A temporal clear-corneal main  wound incision was created using a 2.70mm microkeratome.  A 7.44mm Malyugin ring was placed. A continuous curvilinear capsulorrhexis was initiated using an irrigating cystitome and completed using capsulorrhexis forceps.  Hydrodissection and hydrodeliniation were performed.  Viscoelastic was injected into the anterior chamber.  A phacoemulsification handpiece and a chopper as a second instrument were used to remove the nucleus and epinucleus. The irrigation/aspiration handpiece was used to remove any remaining cortical material.   The capsular bag was reinflated with viscoelastic, checked, and found to be intact.  The intraocular lens was inserted into the capsular bag. The Malyugin ring was removed.   Date of procedure: 12/09/23  Pre-operative diagnosis:  Primary open angle glaucoma, ?? stage, right eye H40.11??  Post-operative diagnosis:  Primary open angle glaucoma, ?? stage, right eye  Procedure: Goniotomy using iAccess of the trabecular meshwork of the right eye  Attending surgeon: Rudy Jew. Stephanee Barcomb, MD, MA  Anesthesia: MAC, Topical Akten  Complications: None  Estimated Blood Loss: <32mL (minimal)  Specimens: None  Implants: None  Indications:  Primary open angle glaucoma, ?? stage, right eye  Procedure:  The patient was seen and identified in the pre-operative area. The operative eye was identified and dilated.  The operative eye was marked.  Topical anesthesia was administered to the operative eye.     The patient was then to the operative suite and placed in the supine position.  A timeout was performed confirming the patient, procedure to be performed, and all other relevant information.   The patient's face was prepped and draped in the usual fashion for intra-ocular surgery.  A lid speculum was placed into the operative eye and the surgical microscope moved into place and focused.  A superotemporal paracentesis was created using a 20  gauge paracentesis blade.  Lidocaine was  injected into the anterior chamber.  Viscoelastic was injected into the anterior chamber.  A temporal clear-corneal main wound incision was created using a 2.3mm microkeratome.    The patient's head was repositioned and microscope moved.  The iAccess device was used to extensively treat the nasal 90 degrees of the trabecular meshwork under gonioscopy.  The patient's head was returned to previous position.  The irrigation/aspiration handpiece was used to remove any remaining viscoelastic.  The clear corneal wound and paracentesis wounds were then hydrated and checked with Weck-Cels to be watertight.  The lid-speculum was removed.  The drape was removed.  The patient's face was cleaned with a wet and dry 4x4.   Maxitrol was instilled in the eye. A clear shield was taped over the eye. The patient was taken to the post-operative care unit in good condition, having tolerated the procedure well.  Post-Op Instructions: The patient will follow up at [facility] for a same day post-operative evaluation and will receive all other orders and instructions.    The irrigation/aspiration handpiece was used to remove any remaining viscoelastic.  The clear corneal wound and paracentesis wounds were then hydrated and checked with Weck-Cels to be watertight. 0.38mL of Moxifloxacin was injected into the anterior chamber. The lid-speculum was removed.    The lower canaliculus was dilated and filled with Provisc. A Dextenza implant was placed in the lower canaliculus without complication.  The drape was removed.  The patient's face was cleaned with a wet and dry 4x4.   A clear shield was taped over the eye. The patient was taken to the post-operative care unit in good condition, having tolerated the procedure well.  Post-Op Instructions: The patient will follow up at [facility] for a same day post-operative evaluation and will receive all other orders and instructions.

## 2023-12-09 NOTE — Anesthesia Procedure Notes (Addendum)
 Procedure Name: MAC Date/Time: 12/09/2023 1:53 PM  Performed by: Julian Reil, CRNAPre-anesthesia Checklist: Patient identified, Emergency Drugs available, Suction available and Patient being monitored Patient Re-evaluated:Patient Re-evaluated prior to induction Oxygen Delivery Method: Nasal cannula Placement Confirmation: positive ETCO2

## 2023-12-09 NOTE — Anesthesia Preprocedure Evaluation (Signed)
 Anesthesia Evaluation  Patient identified by MRN, date of birth, ID band Patient awake    Reviewed: Allergy & Precautions, H&P , NPO status , Patient's Chart, lab work & pertinent test results, reviewed documented beta blocker date and time   Airway Mallampati: II  TM Distance: >3 FB Neck ROM: full    Dental no notable dental hx.    Pulmonary neg pulmonary ROS   Pulmonary exam normal breath sounds clear to auscultation       Cardiovascular Exercise Tolerance: Good hypertension, + dysrhythmias Atrial Fibrillation  Rhythm:irregular Rate:Normal     Neuro/Psych   Anxiety     CVA  negative psych ROS   GI/Hepatic negative GI ROS, Neg liver ROS,,,  Endo/Other   Hyperthyroidism   Renal/GU negative Renal ROS  negative genitourinary   Musculoskeletal   Abdominal   Peds  Hematology negative hematology ROS (+)   Anesthesia Other Findings   Reproductive/Obstetrics negative OB ROS                             Anesthesia Physical Anesthesia Plan  ASA: 3  Anesthesia Plan: MAC   Post-op Pain Management:    Induction:   PONV Risk Score and Plan:   Airway Management Planned:   Additional Equipment:   Intra-op Plan:   Post-operative Plan:   Informed Consent: I have reviewed the patients History and Physical, chart, labs and discussed the procedure including the risks, benefits and alternatives for the proposed anesthesia with the patient or authorized representative who has indicated his/her understanding and acceptance.     Dental Advisory Given  Plan Discussed with: CRNA  Anesthesia Plan Comments:        Anesthesia Quick Evaluation

## 2023-12-09 NOTE — Interval H&P Note (Signed)
 History and Physical Interval Note:  12/09/2023 1:48 PM  Melissa [name]  has presented today for surgery, with the diagnosis of combined forms age related cataract, left eye ocular hypertension, left eye.  The various methods of treatment have been discussed with the patient and family. After consideration of risks, benefits and other options for treatment, the patient has consented to  Procedure(s) with comments: CATARACT EXTRACTION PHACO AND INTRAOCULAR LENS PLACEMENT (IOC) with placement of Corticosteroid (Left) - CDE: Goniotomy : iAccess (Left) as a surgical intervention.  The patient's history has been reviewed, patient examined, no change in status, stable for surgery.  I have reviewed the patient's chart and labs.  Questions were answered to the patient's satisfaction.     [name]

## 2023-12-09 NOTE — Transfer of Care (Signed)
 Immediate Anesthesia Transfer of Care Note  Patient: Melissa [name]  Procedure(s) Performed: CATARACT EXTRACTION PHACO AND INTRAOCULAR LENS PLACEMENT (IOC) with placement of Corticosteroid (Left: Eye) Goniotomy : iAccess (Left: Eye)  Patient Location: Short Stay  Anesthesia Type:MAC  Level of Consciousness: awake, alert , and oriented  Airway & Oxygen Therapy: Patient Spontanous Breathing  Post-op Assessment: Report given to RN and Post -op Vital signs reviewed and stable  Post vital signs: Reviewed and stable  Last Vitals:  Vitals Value Taken Time  BP    Temp    Pulse    Resp    SpO2      Last Pain:  Vitals:   12/09/23 1246  TempSrc: Oral  PainSc: 0-No pain      Patients Stated Pain Goal: 4 (12/09/23 1246)  Complications: No notable events documented.

## 2023-12-13 ENCOUNTER — Telehealth: Payer: Self-pay | Admitting: Physician Assistant

## 2023-12-13 MED ORDER — AMIODARONE HCL 200 MG PO TABS: 200.0000 mg | ORAL_TABLET | Freq: Every day | ORAL | 2 refills | Status: DC

## 2023-12-13 NOTE — Telephone Encounter (Signed)
*  STAT* If patient is at the pharmacy, call can be transferred to refill team.   1. Which medications need to be refilled? (please list name of each medication and dose if known)   amiodarone (PACERONE) 200 MG tablet     2. Would you like to learn more about the convenience, safety, & potential cost savings by using the [facility]? no   3. Are you open to using the [facility] (Type [facility]. ).no   4. Which pharmacy/location (including street and city if local pharmacy) is medication to be sent to? [facility] - [place], [place] - [address]     5. Do they need a 30 day or 90 day supply? 90 day

## 2023-12-13 NOTE — Telephone Encounter (Signed)
 Pt's medication was sent to pt's pharmacy as requested. Confirmation received.

## 2023-12-13 NOTE — Anesthesia Postprocedure Evaluation (Signed)
 Anesthesia Post Note  Patient: Melissa [name]  Procedure(s) Performed: CATARACT EXTRACTION PHACO AND INTRAOCULAR LENS PLACEMENT (IOC) with placement of Corticosteroid (Left: Eye) Goniotomy : iAccess (Left: Eye)  Patient location during evaluation: Phase II Anesthesia Type: MAC Level of consciousness: awake Pain management: pain level controlled Vital Signs Assessment: post-procedure vital signs reviewed and stable Respiratory status: spontaneous breathing and respiratory function stable Cardiovascular status: blood pressure returned to baseline and stable Postop Assessment: no headache and no apparent nausea or vomiting Anesthetic complications: no Comments: Late entry   No notable events documented.   Last Vitals:  Vitals:   12/09/23 1246 12/09/23 1422  BP: (!) 151/61 (!) 150/55  Pulse: (!) 52 (!) 50  Resp: (!) 21 12  Temp: 36.8 C 37.1 C  SpO2: 100% 100%    Last Pain:  Vitals:   12/10/23 1234  TempSrc:   PainSc: 0-No pain                 Windell Norfolk

## 2023-12-16 DIAGNOSIS — H25811 Combined forms of age-related cataract, right eye: Secondary | ICD-10-CM | POA: Diagnosis not present

## 2023-12-23 NOTE — H&P (Signed)
 Surgical History & Physical  Patient Name: Melissa [name] DOB: 1950/11/16  Surgery: Cataract extraction with intraocular lens implant phacoemulsification & Goniotomy Treatment (iAccess); Right Eye  Surgeon: [name] MD Surgery Date:  01-03-24 Pre-Op Date:  12-16-23  HPI: A 28 Yr. old female patient \\r \npresent for 1 week post op OS with iAccess. Patient states everything is so much clearer. She is very happy with vision. Using Combo TID OS and Vyzulta qhs OU.\r\nPatient also presents for persistent blurry vision in the right eye. Difficulties reading fine print and poor night vision due to the blurriness OD. This is negatively affecting the patient\'s quality of life and the patient is unable to function adequately in life with the current level of vision. Patient would like to proceed with cataract sx OD.\r\nHPI Completed by Dr. [name]  Medical History: Cataracts Glaucoma Heart Problem High Blood Pressure Stroke Nervousness  Review of Systems Cardiovascular High Blood Pressure, A-fib Neurological Stroke Psychiatry nervousness All recorded systems are negative except as noted above.  Social   Never smoked   Medication Vyzulta, Prednisolone-moxiflox-bromfen,  hydralazine ,  metoprolol succinate ,  amiodarone ,  amlodipine ,  alprazolam ,  Xarelto ,  olmesartan ,   Sx/Procedures Phaco c IOL OS - iAccess - Dextenza,  Heart Ablation, Knee Replacement - bilat, Hysterectomy,   Drug Allergies   NKDA  History & Physical: Heent: cataract & Ocular Hypertension; Right eye NECK: supple without bruits LUNGS: lungs clear to auscultation CV: regular rate and rhythm Abdomen: soft and non-tender Impression & Plan: Assessment: 1.  CATARACT EXTRACTION STATUS; Left Eye (Z98.42) 2.  Ocular Hypertension; Both Eyes (H40.053) 3.  COMBINED FORMS AGE RELATED CATARACT; Right Eye (H25.811) 4.  Pinguecula; Both Eyes (H11.153)  Plan: 1.  1 week after cataract surgery. Doing well  with improved vision and normal eye pressure. Call with any problems or concerns. Stop drops - Dextenza. Call with any concerning symptoms.  2.  New onset. OCT rNFL shows thinning OD. IOP much improved today. Recommend goniotomy (iAccess or [place] Dual Blade) both eyes at the time of cataract surgery to help lower eye pressure as well as minimize drop use in the setting of systematic cardiac disease. Continue Vyzulta 1 drop every night at bedtime, right eyes - this is expensive to the patient, look for alternative if needed in future.  3.  Cataract accounts for the patient's decreased vision. This visual impairment is not correctable with a tolerable change in glasses or contact lenses. Cataract surgery with an implantation of a new lens should significantly improve the visual and functional status of the patient. Discussed all risks, benefits, alternatives, and potential complications. Discussed the procedures and recovery. Patient desires to have surgery. A-scan ordered and performed today for intra-ocular lens calculations. The surgery will be performed in order to improve vision for driving, reading, and for eye examinations. Recommend phacoemulsification with intra-ocular lens. Recommend Dextenza for post-operative pain and inflammation. Right eye. Dilates poorly - shugarcaine by protocol. Malyugin Ring. Omidira. Recommend goniotomy to control pressure in setting of glaucoma. Right Eye.  4.  Observe; Artificial tears as needed for irritation.

## 2024-01-01 ENCOUNTER — Encounter (HOSPITAL_COMMUNITY)
Admission: RE | Admit: 2024-01-01 | Discharge: 2024-01-01 | Disposition: A | Discharge status: Home / Self Care | Payer: PPO | Source: Ambulatory Visit | Attending: Ophthalmology | Admitting: Ophthalmology

## 2024-01-01 ENCOUNTER — Other Ambulatory Visit: Payer: Self-pay

## 2024-01-01 ENCOUNTER — Encounter (HOSPITAL_COMMUNITY): Payer: Self-pay

## 2024-01-03 ENCOUNTER — Encounter (HOSPITAL_COMMUNITY)
Admission: RE | Disposition: A | Discharge status: Home / Self Care | Payer: Self-pay | Source: Home / Self Care | Attending: Ophthalmology

## 2024-01-03 ENCOUNTER — Ambulatory Visit (HOSPITAL_COMMUNITY)
Admission: RE | Admit: 2024-01-03 | Discharge: 2024-01-03 | Disposition: A | Discharge status: Home / Self Care | Payer: PPO | Attending: Ophthalmology | Admitting: Ophthalmology

## 2024-01-03 ENCOUNTER — Ambulatory Visit (HOSPITAL_BASED_OUTPATIENT_CLINIC_OR_DEPARTMENT_OTHER): Admitting: Certified Registered"

## 2024-01-03 ENCOUNTER — Ambulatory Visit (HOSPITAL_COMMUNITY): Admitting: Certified Registered"

## 2024-01-03 DIAGNOSIS — H40051 Ocular hypertension, right eye: Secondary | ICD-10-CM | POA: Insufficient documentation

## 2024-01-03 DIAGNOSIS — H25811 Combined forms of age-related cataract, right eye: Secondary | ICD-10-CM | POA: Insufficient documentation

## 2024-01-03 DIAGNOSIS — I10 Essential (primary) hypertension: Secondary | ICD-10-CM | POA: Insufficient documentation

## 2024-01-03 DIAGNOSIS — I4891 Unspecified atrial fibrillation: Secondary | ICD-10-CM

## 2024-01-03 DIAGNOSIS — H401110 Primary open-angle glaucoma, right eye, stage unspecified: Secondary | ICD-10-CM | POA: Diagnosis not present

## 2024-01-03 DIAGNOSIS — F419 Anxiety disorder, unspecified: Secondary | ICD-10-CM | POA: Diagnosis not present

## 2024-01-03 DIAGNOSIS — H5711 Ocular pain, right eye: Secondary | ICD-10-CM | POA: Insufficient documentation

## 2024-01-03 DIAGNOSIS — H2181 Floppy iris syndrome: Secondary | ICD-10-CM | POA: Insufficient documentation

## 2024-01-03 HISTORY — PX: INSERTION, STENT, DRUG-ELUTING, LACRIMAL CANALICULUS, WITH PUNCTAL DILATION: SHX7455

## 2024-01-03 SURGERY — CATARACT EXTRACTION PHACO AND INTRAOCULAR LENS PLACEMENT (IOC) with placement of Corticosteroid
Anesthesia: Monitor Anesthesia Care | Site: Eye | Laterality: Right

## 2024-01-03 MED ORDER — CHLORHEXIDINE GLUCONATE 0.12 % MT SOLN: 15.0000 mL | Freq: Once | OROMUCOSAL | Status: DC

## 2024-01-03 MED ORDER — LIDOCAINE HCL (PF) 1 % IJ SOLN
INTRAOCULAR | Status: DC | PRN
  Administered 2024-01-03: 1 mL via OPHTHALMIC

## 2024-01-03 MED ORDER — POVIDONE-IODINE 5 % OP SOLN
OPHTHALMIC | Status: DC | PRN
  Administered 2024-01-03: 1 via OPHTHALMIC

## 2024-01-03 MED ORDER — DEXAMETHASONE 0.4 MG OP INST
VAGINAL_INSERT | OPHTHALMIC | Status: DC | PRN
  Administered 2024-01-03: .4 mg via OPHTHALMIC

## 2024-01-03 MED ORDER — MOXIFLOXACIN HCL 5 MG/ML IO SOLN
INTRAOCULAR | Status: DC | PRN
  Administered 2024-01-03: .3 mL via INTRACAMERAL

## 2024-01-03 MED ORDER — DEXAMETHASONE 0.4 MG OP INST
VAGINAL_INSERT | OPHTHALMIC | Status: AC
Start: 2024-01-03 — End: ?
  Filled 2024-01-03: qty 1

## 2024-01-03 MED ORDER — LIDOCAINE HCL 3.5 % OP GEL
1.0000 | Freq: Once | OPHTHALMIC | Status: AC
  Administered 2024-01-03: 1 via OPHTHALMIC

## 2024-01-03 MED ORDER — TETRACAINE HCL 0.5 % OP SOLN
1.0000 [drp] | OPHTHALMIC | Status: AC | PRN
  Administered 2024-01-03 (×3): 1 [drp] via OPHTHALMIC

## 2024-01-03 MED ORDER — SODIUM HYALURONATE 23MG/ML IO SOSY
PREFILLED_SYRINGE | INTRAOCULAR | Status: DC | PRN
  Administered 2024-01-03: .6 mL via INTRAOCULAR

## 2024-01-03 MED ORDER — PHENYLEPHRINE HCL 2.5 % OP SOLN
1.0000 [drp] | OPHTHALMIC | Status: AC | PRN
  Administered 2024-01-03 (×3): 1 [drp] via OPHTHALMIC

## 2024-01-03 MED ORDER — BSS IO SOLN
INTRAOCULAR | Status: DC | PRN
  Administered 2024-01-03: 15 mL via INTRAOCULAR

## 2024-01-03 MED ORDER — TROPICAMIDE 1 % OP SOLN
1.0000 [drp] | OPHTHALMIC | Status: AC | PRN
  Administered 2024-01-03 (×3): 1 [drp] via OPHTHALMIC

## 2024-01-03 MED ORDER — MIDAZOLAM HCL 2 MG/2ML IJ SOLN
INTRAMUSCULAR | Status: DC | PRN
  Administered 2024-01-03: 1 mg via INTRAVENOUS

## 2024-01-03 MED ORDER — STERILE WATER FOR IRRIGATION IR SOLN
Status: DC | PRN
  Administered 2024-01-03: 250 mL

## 2024-01-03 MED ORDER — PHENYLEPHRINE-KETOROLAC 1-0.3 % IO SOLN
INTRAOCULAR | Status: DC | PRN
  Administered 2024-01-03: 500 mL via OPHTHALMIC

## 2024-01-03 MED ORDER — MIDAZOLAM HCL 2 MG/2ML IJ SOLN
INTRAMUSCULAR | Status: AC
  Filled 2024-01-03: qty 2

## 2024-01-03 MED ORDER — SODIUM HYALURONATE 10 MG/ML IO SOLUTION
PREFILLED_SYRINGE | INTRAOCULAR | Status: DC | PRN
  Administered 2024-01-03: .85 mL via INTRAOCULAR

## 2024-01-03 MED ORDER — ORAL CARE MOUTH RINSE: 15.0000 mL | Freq: Once | OROMUCOSAL | Status: DC

## 2024-01-03 MED ORDER — SODIUM CHLORIDE 0.9% FLUSH
INTRAVENOUS | Status: DC | PRN
  Administered 2024-01-03: 7 mL via INTRAVENOUS

## 2024-01-03 SURGICAL SUPPLY — 15 items
CATARACT SUITE SIGHTPATH (MISCELLANEOUS) ×1 IMPLANT
CLIP IPRISM (KITS) IMPLANT
CLOTH BEACON ORANGE TIMEOUT ST (SAFETY) ×1 IMPLANT
EYE SHIELD UNIVERSAL CLEAR (GAUZE/BANDAGES/DRESSINGS) IMPLANT
FEE CATARACT SUITE SIGHTPATH (MISCELLANEOUS) ×1 IMPLANT
GLOVE BIOGEL PI IND STRL 7.0 (GLOVE) ×2 IMPLANT
LENS IOL TECNIS EYHANCE 21.5 (Intraocular Lens) IMPLANT
NDL HYPO 18GX1.5 BLUNT FILL (NEEDLE) ×2 IMPLANT
NEEDLE HYPO 18GX1.5 BLUNT FILL (NEEDLE) ×2 IMPLANT
PAD ARMBOARD POSITIONER FOAM (MISCELLANEOUS) ×1 IMPLANT
RING MALYGIN 7.0 (MISCELLANEOUS) IMPLANT
SYR TB 1ML LL NO SAFETY (SYRINGE) ×1 IMPLANT
TAPE SURG TRANSPORE 1 IN (GAUZE/BANDAGES/DRESSINGS) IMPLANT
TREPHINE GLAUKO IACCESS TRBCLR (OPHTHALMIC) IMPLANT
WATER STERILE IRR 250ML POUR (IV SOLUTION) ×1 IMPLANT

## 2024-01-03 NOTE — Anesthesia Postprocedure Evaluation (Signed)
 Anesthesia Post Note  Patient: Melissa [name]  Procedure(s) Performed: CATARACT EXTRACTION PHACO AND INTRAOCULAR LENS PLACEMENT (Right: Eye) Goniotomy : iAccess (Right: Eye) INSERTION, STENT, DRUG-ELUTING, LACRIMAL CANALICULUS, WITH PUNCTAL DILATION (Right: Eye)  Patient location during evaluation: PACU Anesthesia Type: MAC Level of consciousness: awake and alert Pain management: pain level controlled Vital Signs Assessment: post-procedure vital signs reviewed and stable Respiratory status: spontaneous breathing, nonlabored ventilation, respiratory function stable and patient connected to nasal cannula oxygen Cardiovascular status: stable and blood pressure returned to baseline Postop Assessment: no apparent nausea or vomiting Anesthetic complications: no   There were no known notable events for this encounter.   Last Vitals:  Vitals:   01/03/24 1000 01/03/24 1050  BP: (!) 156/60 (!) 151/55  Pulse: (!) 49 (!) 49  Resp: 12 11  Temp:  36.7 C  SpO2: 100% 100%    Last Pain:  Vitals:   01/03/24 1050  TempSrc: Oral  PainSc: 0-No pain                 Charice Zuno L Marilyn Nihiser

## 2024-01-03 NOTE — Anesthesia Preprocedure Evaluation (Addendum)
 Anesthesia Evaluation  Patient identified by MRN, date of birth, ID band Patient awake    Reviewed: Allergy & Precautions, H&P , NPO status , Patient's Chart, lab work & pertinent test results, reviewed documented beta blocker date and time   Airway Mallampati: II  TM Distance: >3 FB Neck ROM: full    Dental no notable dental hx. (+) Dental Advisory Given, Teeth Intact   Pulmonary neg pulmonary ROS   Pulmonary exam normal breath sounds clear to auscultation       Cardiovascular Exercise Tolerance: Good hypertension, Normal cardiovascular exam+ dysrhythmias Atrial Fibrillation  Rhythm:irregular Rate:Normal  Heart rate well controlled   Neuro/Psych   Anxiety     CVA  negative psych ROS   GI/Hepatic negative GI ROS, Neg liver ROS,,,  Endo/Other   Hyperthyroidism   Renal/GU negative Renal ROS  negative genitourinary   Musculoskeletal   Abdominal   Peds  Hematology negative hematology ROS (+)   Anesthesia Other Findings   Reproductive/Obstetrics negative OB ROS                             Anesthesia Physical Anesthesia Plan  ASA: 3  Anesthesia Plan: MAC   Post-op Pain Management: Minimal or no pain anticipated   Induction:   PONV Risk Score and Plan: Midazolam  Airway Management Planned: Nasal Cannula and Natural Airway  Additional Equipment: None  Intra-op Plan:   Post-operative Plan:   Informed Consent: I have reviewed the patients History and Physical, chart, labs and discussed the procedure including the risks, benefits and alternatives for the proposed anesthesia with the patient or authorized representative who has indicated his/her understanding and acceptance.     Dental Advisory Given  Plan Discussed with: CRNA  Anesthesia Plan Comments:        Anesthesia Quick Evaluation

## 2024-01-03 NOTE — Op Note (Signed)
 Date of procedure: 01/03/24  Pre-operative diagnosis: Visually significant age-related combined cataract, Right Eye (H25.811), Poor dilation of the right eye; Ocular hypertension, right eye  Post-operative diagnosis:  Visually significant age-related combined cataract, Right Eye (H25.811) Intra-operative floppy iris syndrome, Right eye (H21.81) Ocular Hypertension, right eye Pain and inflammation following cataract surgery, Right Eye (H57.11)  Procedure:  Complex Removal of cataract via phacoemulsification and insertion of intra-ocular lens [employer] DIB00 +21.5D into the capsular bag of the Right Eye [phone]) Goniotomy treatment, trabecular meshwork, right eye Placement of Dextenza Implant, Right Lower Lid  Attending surgeon: Rudy Jew. Chaquana Nichols, MD, MA  Anesthesia: MAC, Topical Akten  Complications: None  Estimated Blood Loss: <63mL (minimal)  Specimens: None  Implants: As above  Indications:  Visually significant age-related cataract, Right Eye  Procedure:  The patient was seen and identified in the pre-operative area. The operative eye was identified and dilated.  The operative eye was marked.  Topical anesthesia was administered to the operative eye.     The patient was then to the operative suite and placed in the supine position.  A timeout was performed confirming the patient, procedure to be performed, and all other relevant information.   The patient's face was prepped and draped in the usual fashion for intra-ocular surgery.  A lid speculum was placed into the operative eye and the surgical microscope moved into place and focused. Poor dilation of the iris was confirmed. An superotemporal paracentesis was created using a 20 gauge paracentesis blade.  Shugarcaine was injected into the anterior chamber.  Viscoelastic was injected into the anterior chamber.  A temporal clear-corneal main wound incision was created using a 2.25mm microkeratome.  A 7.9mm Malyugin ring was  placed. A continuous curvilinear capsulorrhexis was initiated using an irrigating cystitome and completed using capsulorrhexis forceps.  Hydrodissection and hydrodeliniation were performed.  Viscoelastic was injected into the anterior chamber.  A phacoemulsification handpiece and a chopper as a second instrument were used to remove the nucleus and epinucleus. The irrigation/aspiration handpiece was used to remove any remaining cortical material.   The capsular bag was reinflated with viscoelastic, checked, and found to be intact.  The intraocular lens was inserted into the capsular bag.   Date of procedure: 01/03/24  Pre-operative diagnosis:  Primary open angle glaucoma, ?? stage, right eye H40.11??  Post-operative diagnosis:  Primary open angle glaucoma, ?? stage, right eye  Procedure: Goniotomy using iAccess of the trabecular meshwork of the right eye  Attending surgeon: Rudy Jew. Aneshia Jacquet, MD, MA  Anesthesia: MAC, Topical Akten  Complications: None  Estimated Blood Loss: <31mL (minimal)  Specimens: None  Implants: None  Indications:  Primary open angle glaucoma, ?? stage, right eye  Procedure:  The patient was seen and identified in the pre-operative area. The operative eye was identified and dilated.  The operative eye was marked.  Topical anesthesia was administered to the operative eye.     The patient was then to the operative suite and placed in the supine position.  A timeout was performed confirming the patient, procedure to be performed, and all other relevant information.   The patient's face was prepped and draped in the usual fashion for intra-ocular surgery.  A lid speculum was placed into the operative eye and the surgical microscope moved into place and focused.  A superotemporal paracentesis was created using a 20 gauge paracentesis blade.  Lidocaine was injected into the anterior chamber.  Viscoelastic was injected into the anterior chamber.  A temporal clear-corneal  main  wound incision was created using a 2.69mm microkeratome.    The patient's head was repositioned and microscope moved.  The iAccess device was used to extensively treat the nasal 90 degrees of the trabecular meshwork under gonioscopy.  The patient's head was returned to previous position.  The Malyugin ring was removed. The irrigation/aspiration handpiece was used to remove any remaining viscoelastic.  The clear corneal wound and paracentesis wounds were then hydrated and checked with Weck-Cels to be watertight. 0.29mL of Moxifloxacin was injected into the anterior chamber.  The lid-speculum was removed.    The lower canaliculus was dilated and filled with Provisc. A Dextenza implant was placed in the lower canaliculus without complication.  The drape was removed.  The patient's face was cleaned with a wet and dry 4x4.   A clear shield was taped over the eye. The patient was taken to the post-operative care unit in good condition, having tolerated the procedure well.  Post-Op Instructions: The patient will follow up at [facility] for a same day post-operative evaluation and will receive all other orders and instructions.

## 2024-01-03 NOTE — Anesthesia Procedure Notes (Signed)
 Procedure Name: MAC Date/Time: 01/03/2024 10:20 AM  Performed by: Julian Reil, CRNAPre-anesthesia Checklist: Patient identified, Emergency Drugs available, Suction available and Patient being monitored Patient Re-evaluated:Patient Re-evaluated prior to induction Oxygen Delivery Method: Nasal cannula Placement Confirmation: positive ETCO2

## 2024-01-03 NOTE — Transfer of Care (Signed)
 Immediate Anesthesia Transfer of Care Note  Patient: Melissa [name]  Procedure(s) Performed: CATARACT EXTRACTION PHACO AND INTRAOCULAR LENS PLACEMENT (Right: Eye) Goniotomy : iAccess (Right: Eye) INSERTION, STENT, DRUG-ELUTING, LACRIMAL CANALICULUS, WITH PUNCTAL DILATION (Right: Eye)  Patient Location: Short Stay  Anesthesia Type:MAC  Level of Consciousness: awake, alert , and oriented  Airway & Oxygen Therapy: Patient Spontanous Breathing  Post-op Assessment: Report given to RN and Post -op Vital signs reviewed and stable  Post vital signs: Reviewed and stable  Last Vitals:  Vitals Value Taken Time  BP    Temp    Pulse    Resp    SpO2      Last Pain:  Vitals:   01/03/24 0945  PainSc: 0-No pain         Complications: No notable events documented.

## 2024-01-03 NOTE — Discharge Instructions (Addendum)
 Please discharge patient when stable, will follow up today with Dr. June Leap at the [facility] office immediately following discharge.  Leave shield in place until visit.  All paperwork with discharge instructions will be given at the office.  [facility] Address:  [address]  [place], [place] [address]

## 2024-01-03 NOTE — Interval H&P Note (Signed)
 History and Physical Interval Note:  01/03/2024 10:15 AM  Melissa [name]  has presented today for surgery, with the diagnosis of combined forms age related cataract, right eye ocular hypertension, right eye.  The various methods of treatment have been discussed with the patient and family. After consideration of risks, benefits and other options for treatment, the patient has consented to  Procedure(s): CATARACT EXTRACTION PHACO AND INTRAOCULAR LENS PLACEMENT (Right) Goniotomy : iAccess (Right) INSERTION, STENT, DRUG-ELUTING, LACRIMAL CANALICULUS, WITH PUNCTAL DILATION (Right) as a surgical intervention.  The patient's history has been reviewed, patient examined, no change in status, stable for surgery.  I have reviewed the patient's chart and labs.  Questions were answered to the patient's satisfaction.     [name]

## 2024-01-06 ENCOUNTER — Encounter (HOSPITAL_COMMUNITY): Payer: Self-pay | Admitting: Ophthalmology

## 2024-01-07 DIAGNOSIS — I480 Paroxysmal atrial fibrillation: Secondary | ICD-10-CM | POA: Diagnosis not present

## 2024-01-07 DIAGNOSIS — I10 Essential (primary) hypertension: Secondary | ICD-10-CM | POA: Diagnosis not present

## 2024-01-09 ENCOUNTER — Telehealth (HOSPITAL_COMMUNITY): Payer: Self-pay

## 2024-01-09 DIAGNOSIS — D6869 Other thrombophilia: Secondary | ICD-10-CM

## 2024-01-09 DIAGNOSIS — I480 Paroxysmal atrial fibrillation: Secondary | ICD-10-CM

## 2024-01-09 DIAGNOSIS — Z01812 Encounter for preprocedural laboratory examination: Secondary | ICD-10-CM

## 2024-01-09 NOTE — Telephone Encounter (Signed)
 Spoke with patient to complete pre-procedure call. Patient is ready to proceed with ablation.     New medical conditions? No Recent hospitalizations or surgeries? Bilateral cataract extraction and lens implant. Reports she did not have to hold Xarelto prior to procedures.  Started any new medications? Vyzulta eye drops- end course of treatment 01/10/24 Patient made aware to contact office to inform of any new medications started. Any changes in activities of daily living? No  Pre-procedure testing scheduled: lab work ordered, verifying if CT needed with Dr Elberta Fortis office. Confirmed patient is taking Xarelto and will continue taking medication before procedure or it may need to be rescheduled.  Confirmed patient is scheduled for Atrial Fibrillation Ablation on Wednesday, May 7 with Dr. Loman Brooklyn. Instructed patient to arrive at the Main Entrance A at [facility]: [address] [place], [place] [address] and check in at Admitting at 11:30 AM  Advised of plan to go home the same day and will only stay overnight if medically necessary. You MUST have a responsible adult to drive you home and MUST be with you the first 24 hours after you arrive home or your procedure could be cancelled.  Patient verbalized understanding to information provided and is agreeable to proceed with procedure.

## 2024-01-10 ENCOUNTER — Other Ambulatory Visit: Payer: Self-pay

## 2024-01-10 DIAGNOSIS — I480 Paroxysmal atrial fibrillation: Secondary | ICD-10-CM

## 2024-01-13 DIAGNOSIS — D6869 Other thrombophilia: Secondary | ICD-10-CM | POA: Diagnosis not present

## 2024-01-13 DIAGNOSIS — Z01812 Encounter for preprocedural laboratory examination: Secondary | ICD-10-CM | POA: Diagnosis not present

## 2024-01-13 DIAGNOSIS — I480 Paroxysmal atrial fibrillation: Secondary | ICD-10-CM | POA: Diagnosis not present

## 2024-01-14 LAB — BASIC METABOLIC PANEL WITH GFR
BUN/Creatinine Ratio: 16 (ref 12–28)
BUN: 20 mg/dL (ref 8–27)
CO2: 24 mmol/L (ref 20–29)
Calcium: 10.1 mg/dL (ref 8.7–10.3)
Chloride: 102 mmol/L (ref 96–106)
Creatinine, Ser: 1.23 mg/dL — ABNORMAL HIGH (ref 0.57–1.00)
Glucose: 95 mg/dL (ref 70–99)
Potassium: 4.9 mmol/L (ref 3.5–5.2)
Sodium: 142 mmol/L (ref 134–144)
eGFR: 46 mL/min/{1.73_m2} — ABNORMAL LOW (ref >59)

## 2024-01-14 LAB — CBC
Hematocrit: 40.6 % (ref 34.0–46.6)
Hemoglobin: 14 g/dL (ref 11.1–15.9)
MCH: 33.3 pg — ABNORMAL HIGH (ref 26.6–33.0)
MCHC: 34.5 g/dL (ref 31.5–35.7)
MCV: 97 fL (ref 79–97)
Platelets: 170 10*3/uL (ref 150–450)
RBC: 4.2 x10E6/uL (ref 3.77–5.28)
RDW: 12.5 % (ref 11.7–15.4)
WBC: 7.3 10*3/uL (ref 3.4–10.8)

## 2024-01-28 ENCOUNTER — Ambulatory Visit (HOSPITAL_COMMUNITY)
Admission: RE | Admit: 2024-01-28 | Discharge: 2024-01-28 | Disposition: A | Discharge status: Home / Self Care | Source: Ambulatory Visit | Attending: Internal Medicine | Admitting: Internal Medicine

## 2024-01-28 DIAGNOSIS — I480 Paroxysmal atrial fibrillation: Secondary | ICD-10-CM | POA: Diagnosis not present

## 2024-01-28 MED ORDER — IOHEXOL 350 MG/ML SOLN
100.0000 mL | Freq: Once | INTRAVENOUS | Status: AC | PRN
  Administered 2024-01-28: 100 mL via INTRAVENOUS

## 2024-01-29 ENCOUNTER — Telehealth: Payer: Self-pay | Admitting: Cardiology

## 2024-01-29 ENCOUNTER — Telehealth (HOSPITAL_COMMUNITY): Payer: Self-pay

## 2024-01-29 NOTE — Telephone Encounter (Signed)
 Spoke to patient she was calling to ask if she needs to shave groin area before upcoming ablation procedure.Stated she shaved groin last time she had ablation.Advised ok to shave again.I will send message to the RN you spoke to this morning.

## 2024-01-29 NOTE — Telephone Encounter (Signed)
 Patient wants a call back to discuss questions regarding her upcoming procedure.

## 2024-01-29 NOTE — Telephone Encounter (Signed)
 Call placed to patient to discuss upcoming procedure.   CT: completed.  Labs: completed.   Any recent signs of acute illness or been started on antibiotics? No Any new medications started? No Any medications to hold? No  Any missed doses of blood thinner?  No Advised patient to continue taking ANTICOAGULANT: Xarelto  (Rivaroxaban ) daily without missing any doses.  Medication instructions:  On the morning of your procedure DO NOT take any medication., including Xarelto  or the procedure may be rescheduled. Nothing to eat or drink after midnight prior to your procedure.  Confirmed patient is scheduled for Atrial Fibrillation Ablation on Wednesday, May 7 with Dr. Agatha Horsfall. Instructed patient to arrive at the Main Entrance A at [facility]: [address] [place], [place] [address] and check in at Admitting at 11:30 AM.  Advised of plan to go home the same day and will only stay overnight if medically necessary. You MUST have a responsible adult to drive you home and MUST be with you the first 24 hours after you arrive home or your procedure could be cancelled.  Patient verbalized understanding to all instructions provided and agreed to proceed with procedure.

## 2024-01-29 NOTE — Telephone Encounter (Signed)
 Spoke with patient and advised if she decided to shave her groin areas, she still may be shaved on the day of procedure because they groin area has to be completely free of hair. Advised she may be more prone to skin irritation if she shaves prior as well. Patient voiced understanding and states, she will leave the areas to be taken care upon arrival.

## 2024-02-04 NOTE — Pre-Procedure Instructions (Signed)
 Instructed patient on the following items: Arrival time 1100 Nothing to eat or drink after midnight No meds AM of procedure Responsible person to drive you home and stay with you for 24 hrs  Have you missed any doses of anti-coagulant Xarelto - takes once a day, hasn't missed any doses in last 4 weeks

## 2024-02-05 ENCOUNTER — Other Ambulatory Visit: Payer: Self-pay

## 2024-02-05 ENCOUNTER — Ambulatory Visit (HOSPITAL_BASED_OUTPATIENT_CLINIC_OR_DEPARTMENT_OTHER): Admitting: Anesthesiology

## 2024-02-05 ENCOUNTER — Encounter (HOSPITAL_COMMUNITY)
Admission: RE | Disposition: A | Discharge status: Home / Self Care | Payer: Self-pay | Source: Home / Self Care | Attending: Cardiology

## 2024-02-05 ENCOUNTER — Ambulatory Visit (HOSPITAL_COMMUNITY)
Admission: RE | Admit: 2024-02-05 | Discharge: 2024-02-05 | Disposition: A | Discharge status: Home / Self Care | Attending: Cardiology | Admitting: Cardiology

## 2024-02-05 ENCOUNTER — Encounter (HOSPITAL_COMMUNITY): Payer: Self-pay | Admitting: Cardiology

## 2024-02-05 ENCOUNTER — Ambulatory Visit (HOSPITAL_COMMUNITY): Admitting: Anesthesiology

## 2024-02-05 DIAGNOSIS — Z8673 Personal history of transient ischemic attack (TIA), and cerebral infarction without residual deficits: Secondary | ICD-10-CM | POA: Diagnosis not present

## 2024-02-05 DIAGNOSIS — Z79899 Other long term (current) drug therapy: Secondary | ICD-10-CM | POA: Insufficient documentation

## 2024-02-05 DIAGNOSIS — I10 Essential (primary) hypertension: Secondary | ICD-10-CM | POA: Insufficient documentation

## 2024-02-05 DIAGNOSIS — I639 Cerebral infarction, unspecified: Secondary | ICD-10-CM

## 2024-02-05 DIAGNOSIS — I4891 Unspecified atrial fibrillation: Secondary | ICD-10-CM

## 2024-02-05 DIAGNOSIS — E039 Hypothyroidism, unspecified: Secondary | ICD-10-CM | POA: Diagnosis not present

## 2024-02-05 DIAGNOSIS — I480 Paroxysmal atrial fibrillation: Secondary | ICD-10-CM | POA: Diagnosis not present

## 2024-02-05 HISTORY — PX: ATRIAL FIBRILLATION ABLATION: EP1191

## 2024-02-05 LAB — POCT ACTIVATED CLOTTING TIME: Activated Clotting Time: 475 s

## 2024-02-05 MED ORDER — FENTANYL CITRATE (PF) 100 MCG/2ML IJ SOLN
INTRAMUSCULAR | Status: AC
  Filled 2024-02-05: qty 2

## 2024-02-05 MED ORDER — PHENYLEPHRINE HCL-NACL 20-0.9 MG/250ML-% IV SOLN
INTRAVENOUS | Status: DC | PRN
  Administered 2024-02-05: 40 ug/min via INTRAVENOUS

## 2024-02-05 MED ORDER — ONDANSETRON HCL 4 MG/2ML IJ SOLN
INTRAMUSCULAR | Status: DC | PRN
  Administered 2024-02-05: 4 mg via INTRAVENOUS

## 2024-02-05 MED ORDER — ATROPINE SULFATE 1 MG/ML IV SOLN
INTRAVENOUS | Status: DC | PRN
  Administered 2024-02-05: 1 mg via INTRAVENOUS

## 2024-02-05 MED ORDER — LIDOCAINE 2% (20 MG/ML) 5 ML SYRINGE
INTRAMUSCULAR | Status: DC | PRN
  Administered 2024-02-05: 60 mg via INTRAVENOUS

## 2024-02-05 MED ORDER — HEPARIN (PORCINE) IN NACL 2000-0.9 UNIT/L-% IV SOLN
INTRAVENOUS | Status: DC | PRN
  Administered 2024-02-05 (×4): 1000 mL

## 2024-02-05 MED ORDER — FENTANYL CITRATE (PF) 250 MCG/5ML IJ SOLN
INTRAMUSCULAR | Status: DC | PRN
  Administered 2024-02-05 (×2): 50 ug via INTRAVENOUS

## 2024-02-05 MED ORDER — DEXAMETHASONE SODIUM PHOSPHATE 10 MG/ML IJ SOLN
INTRAMUSCULAR | Status: DC | PRN
  Administered 2024-02-05: 5 mg via INTRAVENOUS

## 2024-02-05 MED ORDER — PROTAMINE SULFATE 10 MG/ML IV SOLN
INTRAVENOUS | Status: DC | PRN
  Administered 2024-02-05: 40 mg via INTRAVENOUS

## 2024-02-05 MED ORDER — SUGAMMADEX SODIUM 200 MG/2ML IV SOLN
INTRAVENOUS | Status: DC | PRN
  Administered 2024-02-05: 300 mg via INTRAVENOUS

## 2024-02-05 MED ORDER — HEPARIN SODIUM (PORCINE) 1000 UNIT/ML IJ SOLN
INTRAMUSCULAR | Status: DC | PRN
  Administered 2024-02-05: 14000 [IU] via INTRAVENOUS

## 2024-02-05 MED ORDER — EPHEDRINE SULFATE-NACL 50-0.9 MG/10ML-% IV SOSY
PREFILLED_SYRINGE | INTRAVENOUS | Status: DC | PRN
  Administered 2024-02-05 (×2): 5 mg via INTRAVENOUS

## 2024-02-05 MED ORDER — PROPOFOL 10 MG/ML IV BOLUS
INTRAVENOUS | Status: DC | PRN
  Administered 2024-02-05: 150 mg via INTRAVENOUS

## 2024-02-05 MED ORDER — SODIUM CHLORIDE 0.9 % IV SOLN: INTRAVENOUS | Status: DC

## 2024-02-05 MED ORDER — ROCURONIUM BROMIDE 10 MG/ML (PF) SYRINGE
PREFILLED_SYRINGE | INTRAVENOUS | Status: DC | PRN
  Administered 2024-02-05: 70 mg via INTRAVENOUS

## 2024-02-05 NOTE — Transfer of Care (Signed)
 Immediate Anesthesia Transfer of Care Note  Patient: Melissa [name]  Procedure(s) Performed: ATRIAL FIBRILLATION ABLATION  Patient Location: Cath Lab  Anesthesia Type:General  Level of Consciousness: drowsy  Airway & Oxygen Therapy: Patient Spontanous Breathing and Patient connected to nasal cannula oxygen  Post-op Assessment: Report given to RN and Post -op Vital signs reviewed and stable  Post vital signs: Reviewed and stable  Last Vitals:  Vitals Value Taken Time  BP 100/73 02/05/24 1443  Temp    Pulse 70 02/05/24 1445  Resp 20 02/05/24 1445  SpO2 100 % 02/05/24 1445  Vitals shown include unfiled device data.  Last Pain:  Vitals:   02/05/24 1100  TempSrc: Oral         Complications: There were no known notable events for this encounter.

## 2024-02-05 NOTE — Anesthesia Procedure Notes (Signed)
 Procedure Name: Intubation Date/Time: 02/05/2024 1:29 PM  Performed by: Leslye Rast, MDPre-anesthesia Checklist: Patient identified, Emergency Drugs available, Suction available and Patient being monitored Patient Re-evaluated:Patient Re-evaluated prior to induction Oxygen Delivery Method: Circle System Utilized Preoxygenation: Pre-oxygenation with 100% oxygen Induction Type: IV induction Ventilation: Mask ventilation without difficulty Grade View: Grade III Tube type: Oral Tube size: 7.0 mm Number of attempts: 1 Airway Equipment and Method: Stylet and Bite block Placement Confirmation: ETT inserted through vocal cords under direct vision, positive ETCO2 and breath sounds checked- equal and bilateral Secured at: 22 cm Tube secured with: Tape Dental Injury: Teeth and Oropharynx as per pre-operative assessment

## 2024-02-05 NOTE — H&P (Signed)
  Electrophysiology Office Note:   Date:  02/05/2024  ID:  Melissa [name], Melissa [name] 1950/12/05, MRN [id number]  Primary Cardiologist: None Primary Heart Failure: None Electrophysiologist: Patrice Moates Cortland Ding, MD      History of Present Illness:   Melissa [name] is a 73 y.o. female with h/o atrial fibrillation, CVA, hypertension seen today for routine electrophysiology followup.   She is post atrial fibrillation ablation.  She was admitted to the hospital in November 2020 for palpitations and chest discomfort and was found to be in rapid atrial fibrillation.  She was started on amiodarone .  Today, denies symptoms of palpitations, chest pain, shortness of breath, orthopnea, PND, lower extremity edema, claudication, dizziness, presyncope, syncope, bleeding, or neurologic sequela. The patient is tolerating medications without difficulties. Plan ablation today.   EP Information / Studies Reviewed:    EKG is not ordered today. EKG from 08/05/2023 reviewed which showed atrial fibrillation        Risk Assessment/Calculations:    CHA2DS2-VASc Score = 5   This indicates a 7.2% annual risk of stroke. The patient's score is based upon: CHF History: 0 HTN History: 1 Diabetes History: 0 Stroke History: 2 Vascular Disease History: 0 Age Score: 1 Gender Score: 1            Physical Exam:   VS:  BP (!) 193/66   Pulse 67   Temp 98.3 F (36.8 C) (Oral)   Ht 5\' 8"  (1.727 m)   Wt 74.8 kg   SpO2 100%   BMI 25.09 kg/m    Wt Readings from Last 3 Encounters:  02/05/24 74.8 kg  01/01/24 72.6 kg  12/09/23 72.6 kg    GEN: No acute distress.   Neck: No JVD Cardiac: RRR, no murmurs, rubs, or gallops.  Respiratory: normal BS bilaterally. GI: Soft, nontender, non-distended  MS: No edema; No deformity. Neuro:  Nonfocal  Skin: warm and dry Psych: Normal affect    ASSESSMENT AND PLAN:    1.  Paroxysmal atrial fibrillation: Melissa [name] has presented today for surgery, with the  diagnosis of AF.  The various methods of treatment have been discussed with the patient and family. After consideration of risks, benefits and other options for treatment, the patient has consented to  Procedure(s): Catheter ablation as a surgical intervention .  Risks include but not limited to complete heart block, stroke, esophageal damage, nerve damage, bleeding, vascular damage, tamponade, perforation, MI, and death. The patient's history has been reviewed, patient examined, no change in status, stable for surgery.  I have reviewed the patient's chart and labs.  Questions were answered to the patient's satisfaction.    Melissa [name] Melissa Pray, MD 02/05/2024 12:34 PM

## 2024-02-05 NOTE — Anesthesia Preprocedure Evaluation (Signed)
 Anesthesia Evaluation  Patient identified by MRN, date of birth, ID band Patient awake    Reviewed: Allergy & Precautions, NPO status , Patient's Chart, lab work & pertinent test results, reviewed documented beta blocker date and time   History of Anesthesia Complications Negative for: history of anesthetic complications  Airway Mallampati: II  TM Distance: >3 FB     Dental no notable dental hx.    Pulmonary neg COPD, neg PE   breath sounds clear to auscultation       Cardiovascular hypertension, (-) CAD, (-) Past MI, (-) Cardiac Stents and (-) CABG + dysrhythmias Atrial Fibrillation  Rhythm:Regular Rate:Normal     Neuro/Psych neg Seizures  Anxiety     CVA, No Residual Symptoms    GI/Hepatic ,neg GERD  ,,(+) neg Cirrhosis        Endo/Other   Hyperthyroidism   Renal/GU Renal disease     Musculoskeletal  (+) Arthritis ,    Abdominal   Peds  Hematology   Anesthesia Other Findings   Reproductive/Obstetrics                              Anesthesia Physical Anesthesia Plan  ASA: 3  Anesthesia Plan: General   Post-op Pain Management:    Induction: Intravenous  PONV Risk Score and Plan: 2 and Ondansetron  and Dexamethasone   Airway Management Planned: Oral ETT  Additional Equipment:   Intra-op Plan:   Post-operative Plan: Extubation in OR  Informed Consent: I have reviewed the patients History and Physical, chart, labs and discussed the procedure including the risks, benefits and alternatives for the proposed anesthesia with the patient or authorized representative who has indicated his/her understanding and acceptance.     Dental advisory given  Plan Discussed with: CRNA  Anesthesia Plan Comments:          Anesthesia Quick Evaluation

## 2024-02-05 NOTE — Discharge Instructions (Signed)

## 2024-02-05 NOTE — Progress Notes (Signed)
 Patient walked to the bathroom without difficulties. Bilateral groins level 0, clean, dry, and intact.

## 2024-02-06 ENCOUNTER — Encounter (HOSPITAL_COMMUNITY): Payer: Self-pay | Admitting: Cardiology

## 2024-02-06 ENCOUNTER — Telehealth (HOSPITAL_COMMUNITY): Payer: Self-pay

## 2024-02-06 MED FILL — Fentanyl Citrate Preservative Free (PF) Inj 100 MCG/2ML: INTRAMUSCULAR | Qty: 2 | Status: AC

## 2024-02-06 NOTE — Anesthesia Postprocedure Evaluation (Signed)
 Anesthesia Post Note  Patient: Melissa [name]  Procedure(s) Performed: ATRIAL FIBRILLATION ABLATION     Patient location during evaluation: PACU Anesthesia Type: General Level of consciousness: awake and alert Pain management: pain level controlled Vital Signs Assessment: post-procedure vital signs reviewed and stable Respiratory status: spontaneous breathing, nonlabored ventilation, respiratory function stable and patient connected to nasal cannula oxygen Cardiovascular status: blood pressure returned to baseline and stable Postop Assessment: no apparent nausea or vomiting Anesthetic complications: no   There were no known notable events for this encounter.  Last Vitals:  Vitals:   02/05/24 1700 02/05/24 1800  BP: (!) 113/58 (!) 137/56  Pulse: 61 60  Resp: 19 15  Temp:    SpO2: 100% 92%    Last Pain:  Vitals:   02/05/24 1549  TempSrc:   PainSc: 0-No pain                 Leslye Rast

## 2024-02-06 NOTE — Telephone Encounter (Signed)
 Spoke with patient to complete post procedure follow up call.  Patient reports no complications with groin sites.   Instructions reviewed with patient:  Remove large bandage at puncture site after 24 hours. It is normal to have bruising, tenderness and a pea or marble sized lump/knot at the groin site which can take up to three months to resolve.  Get help right away if you notice sudden swelling at the puncture site.  Check your puncture site every day for signs of infection: fever, redness, swelling, pus drainage, warmth, foul odor or excessive pain. If this occurs, please call the office at [phone], to speak with the nurse. Get help right away if your puncture site is bleeding and the bleeding does not stop after applying firm pressure to the area.  You may continue to have skipped beats/ atrial fibrillation during the first several months after your procedure.  It is very important not to miss any doses of your blood thinner Xarelto . Patient restarted taking this medication on 02/05/24.   You will follow up with the Afib clinic on 03/04/24 and follow up with the APP on 05/06/24.   Patient verbalized understanding to all instructions provided.

## 2024-03-04 ENCOUNTER — Ambulatory Visit (HOSPITAL_COMMUNITY)
Admission: RE | Admit: 2024-03-04 | Discharge: 2024-03-04 | Disposition: A | Discharge status: Home / Self Care | Source: Ambulatory Visit | Attending: Internal Medicine | Admitting: Internal Medicine

## 2024-03-04 ENCOUNTER — Encounter (HOSPITAL_COMMUNITY): Payer: Self-pay | Admitting: Internal Medicine

## 2024-03-04 VITALS — BP 162/82 | HR 55 | Ht 68.0 in | Wt 173.0 lb

## 2024-03-04 DIAGNOSIS — Z79899 Other long term (current) drug therapy: Secondary | ICD-10-CM

## 2024-03-04 DIAGNOSIS — Z5181 Encounter for therapeutic drug level monitoring: Secondary | ICD-10-CM

## 2024-03-04 DIAGNOSIS — I480 Paroxysmal atrial fibrillation: Secondary | ICD-10-CM | POA: Diagnosis not present

## 2024-03-04 DIAGNOSIS — D6869 Other thrombophilia: Secondary | ICD-10-CM | POA: Diagnosis not present

## 2024-03-04 NOTE — Progress Notes (Signed)
 Primary Care Physician: Artemisa Bile, MD Primary Cardiologist: Dr Wanetta Guthrie Primary Electrophysiologist: Dr Lawana Pray Referring Physician: Dr Ozie Bo Melissa [name] is a 73 y.o. female with a history of paroxysmal atrial fibrillation, CVA, and HTN who presents for follow up in the [facility]. Patient underwent previously afib ablation with Dr Lawana Pray on 05/08/19.  On follow up 03/04/24, patient is currently in NSR. S/p Afib ablation on 02/05/24 by Dr. Lawana Pray. No episodes of Afib since ablation. She is taking amiodarone  200 mg daily. No chest pain or SOB. Leg sites healed without issue. No missed doses of Xarelto  20 mg daily.   Today, she denies symptoms of orthopnea, PND, lower extremity edema, dizziness, presyncope, syncope, snoring, daytime somnolence, bleeding, or neurologic sequela. The patient is tolerating medications without difficulties and is otherwise without complaint today.    Atrial Fibrillation Risk Factors:  she does not have symptoms or diagnosis of sleep apnea.   she has a BMI of Body mass index is 26.3 kg/m.Aaron Aas Filed Weights   03/04/24 1334  Weight: 78.5 kg     Family History  Problem Relation Age of Onset   CVA Maternal Grandfather 22   Colon cancer Neg Hx      Atrial Fibrillation Management history:  Previous antiarrhythmic drugs: flecainide , amiodarone  Previous cardioversions: none Previous ablations: 05/08/19, 02/05/24 CHADS2VASC score: 5 Anticoagulation history: Xarelto    Past Medical History:  Diagnosis Date   Atrial fibrillation (HCC)    CVA (cerebral infarction) 11/2013   aphasia   Hypertension    Hyperthyroidism    remote   Uterine cancer (HCC)    Past Surgical History:  Procedure Laterality Date   ABDOMINAL HYSTERECTOMY     ATRIAL FIBRILLATION ABLATION N/A 05/08/2019   Procedure: ATRIAL FIBRILLATION ABLATION;  Surgeon: Lei Pump, MD;  Location: [facility] CV [facility];  Service: Cardiovascular;   Laterality: N/A;   ATRIAL FIBRILLATION ABLATION N/A 02/05/2024   Procedure: ATRIAL FIBRILLATION ABLATION;  Surgeon: Lei Pump, MD;  Location: [facility] INVASIVE CV LAB;  Service: Cardiovascular;  Laterality: N/A;   CHOLECYSTECTOMY     COLONOSCOPY N/A 05/12/2014   Procedure: COLONOSCOPY;  Surgeon: Suzette Espy, MD;  Location: [facility];  Service: Endoscopy;  Laterality: N/A;  1130   COLONOSCOPY N/A 07/26/2017   Procedure: COLONOSCOPY;  Surgeon: Suzette Espy, MD;  Location: [facility];  Service: Endoscopy;  Laterality: N/A;  1:00 pm   INSERTION, STENT, DRUG-ELUTING, LACRIMAL CANALICULUS, WITH PUNCTAL DILATION Right 01/03/2024   Procedure: INSERTION, STENT, DRUG-ELUTING, LACRIMAL CANALICULUS, WITH PUNCTAL DILATION;  Surgeon: Tarri Farm, MD;  Location: [facility] ORS;  Service: Ophthalmology;  Laterality: Right;   REPLACEMENT TOTAL KNEE BILATERAL     bilateral knees replaced twice    Current Outpatient Medications  Medication Sig Dispense Refill   acetaminophen  (TYLENOL ) 500 MG tablet Take 1,000 mg by mouth every 6 (six) hours as needed (pain).     ALPRAZolam  (XANAX ) 0.25 MG tablet Take 1 tablet (0.25 mg total) by mouth 3 (three) times daily as needed for anxiety or sleep. 10 tablet 0   amiodarone  (PACERONE ) 200 MG tablet Take 1 tablet (200 mg total) by mouth daily. 90 tablet 2   amLODipine  (NORVASC ) 5 MG tablet Take 7.5 mg by mouth daily.     hydrALAZINE  (APRESOLINE ) 10 MG tablet Take 1 tablet (10 mg total) by mouth 3 (three) times daily. 270 tablet 0   metoprolol  succinate (TOPROL  XL) 25 MG 24 hr tablet Take 0.5  tablets (12.5 mg total) by mouth at bedtime. 45 tablet 3   Multiple Vitamin (MULTIVITAMIN WITH MINERALS) TABS tablet Take 1 tablet by mouth daily.     olmesartan  (BENICAR ) 40 MG tablet Take 40 mg by mouth at bedtime.     rivaroxaban  (XARELTO ) 20 MG TABS tablet Take 1 tablet (20 mg total) by mouth daily with supper. 30 tablet 0   No current facility-administered medications for  this encounter.    Allergies  Allergen Reactions   Chloromycetin [Chloramphenicol] Nausea And Vomiting    Pt states this "makes her sick"    ROS- All systems are reviewed and negative except as per the HPI above.  Physical Exam: Vitals:   03/04/24 1334  BP: (!) 162/82  Pulse: (!) 55  Weight: 78.5 kg  Height: 5\' 8"  (1.727 m)    GEN- The patient is well appearing, alert and oriented x 3 today.   Neck - no JVD or carotid bruit noted Lungs- Clear to ausculation bilaterally, normal work of breathing Heart- Regular rate and rhythm, no murmurs, rubs or gallops, PMI not laterally displaced Extremities- no clubbing, cyanosis, or edema Skin - no rash or ecchymosis noted   Wt Readings from Last 3 Encounters:  03/04/24 78.5 kg  02/05/24 74.8 kg  01/01/24 72.6 kg    EKG today demonstrates  Vent. rate 55 BPM PR interval 160 ms QRS duration 82 ms QT/QTcB 480/459 ms P-R-T axes 93 1 67 Sinus bradycardia Low voltage QRS Septal infarct , age undetermined Abnormal ECG   Echo 09/13/21: 1. Left ventricular ejection fraction, by estimation, is 60 to 65%. The  left ventricle has normal function. The left ventricle has no regional  wall motion abnormalities. There is mild asymmetric left ventricular  hypertrophy of the basal-septal segment.  Left ventricular diastolic parameters are consistent with Grade I  diastolic dysfunction (impaired relaxation).   2. Right ventricular systolic function is normal. The right ventricular  size is normal. There is mildly elevated pulmonary artery systolic  pressure. The estimated right ventricular systolic pressure is 38.0 mmHg.   3. The mitral valve is normal in structure. Trivial mitral valve  regurgitation.   4. The aortic valve is tricuspid. There is mild thickening of the aortic  valve. Aortic valve regurgitation is not visualized. Aortic valve  sclerosis is present, with no evidence of aortic valve stenosis.   5. The inferior vena cava is  normal in size with greater than 50%  respiratory variability, suggesting right atrial pressure of 3 mmHg.   Epic records are reviewed at length today  CHA2DS2-VASc Score = 5  The patient's score is based upon: CHF History: 0 HTN History: 1 Diabetes History: 0 Stroke History: 2 Vascular Disease History: 0 Age Score: 1 Gender Score: 1       ASSESSMENT AND PLAN: Paroxysmal Atrial Fibrillation (ICD10:  I48.0) The patient's CHA2DS2-VASc score is 5, indicating a 7.2% annual risk of stroke.   S/p Afib ablation 05/08/2019 by Dr. Lawana Pray. S/p Afib ablation on 02/05/24 by Dr. Lawana Pray.   She is currently in NSR.  High risk medication monitoring (ICD10: U5195107) Patient requires ongoing monitoring for anti-arrhythmic medication which has the potential to cause life threatening arrhythmias or AV block. Qtc stable. Continue amiodarone  200 mg daily.  Secondary Hypercoagulable State (ICD10:  D68.69) The patient is at significant risk for stroke/thromboembolism based upon her CHA2DS2-VASc Score of 5.  Continue Rivaroxaban  (Xarelto ).  No missed doses of Xarelto .     Follow up with EP as  scheduled.    Woody Heading, PA-C Afib Clinic [facility] [address] [place], [place] [address] [phone] 03/04/2024 1:50 PM

## 2024-04-15 DIAGNOSIS — I10 Essential (primary) hypertension: Secondary | ICD-10-CM | POA: Diagnosis not present

## 2024-04-15 DIAGNOSIS — F411 Generalized anxiety disorder: Secondary | ICD-10-CM | POA: Diagnosis not present

## 2024-04-15 DIAGNOSIS — I480 Paroxysmal atrial fibrillation: Secondary | ICD-10-CM | POA: Diagnosis not present

## 2024-05-04 NOTE — Progress Notes (Unsigned)
 Cardiology Office Note:  .   Date:  05/04/2024  ID:  Melissa [name], Melissa [name] 1951-09-20, MRN [id number] PCP: Sheryle Carwin, MD  [facility] Providers Cardiologist:  None Electrophysiologist:  Will Gladis Norton, MD {  History of Present Illness: .   Melissa [name] is a 73 y.o. female w/PMHx of HTN, stroke, AFib, hyperthyroidism,   She saw Dr. Norton 11/30/19, post ablation, minimal palpitations, reported some bleeding gums, xarelto  >> Eliquis  Seeing A. Tillery a few times since then, last on 12/21/22, doing well, no symptoms of Afib.  PR longer then baseline, though in review with Dr. Norton, stable from most recent and flecainide  continued BP elevated again in clinic though repots better at home  Admitted 08/05/23 with rapid palpitations that persisted despite an extra flecainide  dose. Discussed intentional weight loss of 30+ lbs by diet Started on dilt gtt and had subsequent spontaneous conversion She was discharged the following day 08/06/23 Unchanged meds  Re-admitted 08/09/23 with recurrent rapid HRs, found in flutter 110s-120s flecainide  stopped >> amiodarone  in d/w Dr. Norton  Discharged 08/11/23 reportedly in rate controlled AFib/flutter Off amlodipine  and flecainide  On Amiodarone  200mg  BID x 3 weeks >daily Metoprolol  25mg  at HS  Afib ablation 57/25  Saw the Afib clinic team 03/04/24, no symptoms of AFib, no procedural concerns  Today's visit is scheduled as his 90 day post ablation visit visit ROS:   *** symptoms *** xarelto , dose, bleeding, labs *** stop amio   Arrhythmia/AAD hx PVI ablation 05/08/2019 Flecainde required dose reduction for interval lengthening > stopped Nov 2024 with recurrent arrhythmia Amiodarone  started Nov 2024  Has had bradycardia requiring dose reduction of BB   Studies Reviewed: SABRA    EKG done today and reviewed by myself:  ***   09/13/21: TTE 1. Left ventricular ejection fraction, by estimation, is 60 to 65%. The  left  ventricle has normal function. The left ventricle has no regional  wall motion abnormalities. There is mild asymmetric left ventricular  hypertrophy of the basal-septal segment.  Left ventricular diastolic parameters are consistent with Grade I  diastolic dysfunction (impaired relaxation).   2. Right ventricular systolic function is normal. The right ventricular  size is normal. There is mildly elevated pulmonary artery systolic  pressure. The estimated right ventricular systolic pressure is 38.0 mmHg.   3. The mitral valve is normal in structure. Trivial mitral valve  regurgitation.   4. The aortic valve is tricuspid. There is mild thickening of the aortic  valve. Aortic valve regurgitation is not visualized. Aortic valve  sclerosis is present, with no evidence of aortic valve stenosis.   5. The inferior vena cava is normal in size with greater than 50%  respiratory variability, suggesting right atrial pressure of 3 mmHg.   Comparison(s): Compared to prior TTE in 01/2019, there is no significant  change.   05/08/2019: EPS, ablation CONCLUSIONS: 1. Sinus rhythm upon presentation.   2. Successful electrical isolation and anatomical encircling of all four pulmonary veins with radiofrequency current. 3. No inducible arrhythmias following ablation both on and off of dobutamine  4. No early apparent complications.  Risk Assessment/Calculations:    Physical Exam:   VS:  There were no vitals taken for this visit.   Wt Readings from Last 3 Encounters:  03/04/24 173 lb (78.5 kg)  02/05/24 165 lb (74.8 kg)  01/01/24 160 lb 0.9 oz (72.6 kg)    GEN: Well nourished, well developed in no acute distress NECK: No JVD; No carotid bruits CARDIAC: ***  RRR, bradycardic, no murmurs, rubs, gallops RESPIRATORY: *** CTA b/l without rales, wheezing or rhonchi  ABDOMEN: Soft, non-tender, non-distended EXTREMITIES: *** No edema; No deformity    ASSESSMENT AND PLAN: .    paroxysmal AFib CHA2DS2Vasc is  5, on Xarelto , *** appropriately dosed ***  HTN ***  Secondary hypercoagulable state 2/2 AFib    Dispo: back in 3 weeks or so, sooner if needed, plan for amio labs and arrhythmi burden as her loading dose completes  Signed, Charlies Macario Arthur, PA-C

## 2024-05-06 ENCOUNTER — Encounter: Payer: Self-pay | Admitting: Physician Assistant

## 2024-05-06 ENCOUNTER — Ambulatory Visit: Attending: Cardiology | Admitting: Physician Assistant

## 2024-05-06 VITALS — BP 150/82 | HR 62 | Ht 68.0 in | Wt 178.0 lb

## 2024-05-06 DIAGNOSIS — Z79899 Other long term (current) drug therapy: Secondary | ICD-10-CM

## 2024-05-06 DIAGNOSIS — Z5181 Encounter for therapeutic drug level monitoring: Secondary | ICD-10-CM

## 2024-05-06 DIAGNOSIS — I480 Paroxysmal atrial fibrillation: Secondary | ICD-10-CM | POA: Diagnosis not present

## 2024-05-06 DIAGNOSIS — I10 Essential (primary) hypertension: Secondary | ICD-10-CM | POA: Diagnosis not present

## 2024-05-06 DIAGNOSIS — D6869 Other thrombophilia: Secondary | ICD-10-CM | POA: Diagnosis not present

## 2024-05-06 NOTE — Patient Instructions (Signed)
 Medication Instructions:   STOP TAKING :  AMIODARONE    *If you need a refill on your cardiac medications before your next appointment, please call your pharmacy*    Lab Work: NONE ORDERED  TODAY    If you have labs (blood work) drawn today and your tests are completely normal, you will receive your results only by: MyChart Message (if you have MyChart) OR A paper copy in the mail If you have any lab test that is abnormal or we need to change your treatment, we will call you to review the results.    Testing/Procedures: NONE ORDERED  TODAY     Follow-Up: At [facility], you and your health needs are our priority.  As part of our continuing mission to provide you with exceptional heart care, our providers are all part of one team.  This team includes your primary Cardiologist (physician) and Advanced Practice Providers or APPs (Physician Assistants and Nurse Practitioners) who all work together to provide you with the care you need, when you need it.  Your next appointment:   4 month(s) Provider:  You may see Will Gladis Norton, MD or one of the following Advanced Practice Providers on your designated Care Team:   Charlies Arthur, [place]  We recommend signing up for the patient portal called MyChart.  Sign up information is provided on this After Visit Summary.  MyChart is used to connect with patients for Virtual Visits (Telemedicine).  Patients are able to view lab/test results, encounter notes, upcoming appointments, etc.  Non-urgent messages can be sent to your provider as well.   To learn more about what you can do with MyChart, go to [email or web address].   Other Instructions

## 2024-06-02 DIAGNOSIS — Z961 Presence of intraocular lens: Secondary | ICD-10-CM | POA: Diagnosis not present

## 2024-06-02 DIAGNOSIS — H401132 Primary open-angle glaucoma, bilateral, moderate stage: Secondary | ICD-10-CM | POA: Diagnosis not present

## 2024-07-10 DIAGNOSIS — I480 Paroxysmal atrial fibrillation: Secondary | ICD-10-CM | POA: Diagnosis not present

## 2024-07-10 DIAGNOSIS — I10 Essential (primary) hypertension: Secondary | ICD-10-CM | POA: Diagnosis not present

## 2024-07-10 DIAGNOSIS — Z79899 Other long term (current) drug therapy: Secondary | ICD-10-CM | POA: Diagnosis not present

## 2024-07-10 DIAGNOSIS — I618 Other nontraumatic intracerebral hemorrhage: Secondary | ICD-10-CM | POA: Diagnosis not present

## 2024-07-10 DIAGNOSIS — M1990 Unspecified osteoarthritis, unspecified site: Secondary | ICD-10-CM | POA: Diagnosis not present

## 2024-07-17 DIAGNOSIS — I480 Paroxysmal atrial fibrillation: Secondary | ICD-10-CM | POA: Diagnosis not present

## 2024-07-17 DIAGNOSIS — N183 Chronic kidney disease, stage 3 unspecified: Secondary | ICD-10-CM | POA: Diagnosis not present

## 2024-07-17 DIAGNOSIS — Z8541 Personal history of malignant neoplasm of cervix uteri: Secondary | ICD-10-CM | POA: Diagnosis not present

## 2024-07-17 DIAGNOSIS — I10 Essential (primary) hypertension: Secondary | ICD-10-CM | POA: Diagnosis not present

## 2024-07-17 DIAGNOSIS — D6869 Other thrombophilia: Secondary | ICD-10-CM | POA: Diagnosis not present

## 2024-07-23 ENCOUNTER — Other Ambulatory Visit (HOSPITAL_COMMUNITY): Payer: Self-pay | Admitting: Internal Medicine

## 2024-07-23 DIAGNOSIS — Z1231 Encounter for screening mammogram for malignant neoplasm of breast: Secondary | ICD-10-CM

## 2024-08-03 ENCOUNTER — Ambulatory Visit (HOSPITAL_COMMUNITY)

## 2024-08-25 ENCOUNTER — Emergency Department (HOSPITAL_COMMUNITY)
Admission: EM | Admit: 2024-08-25 | Discharge: 2024-08-25 | Disposition: A | Discharge status: Home / Self Care | Attending: Emergency Medicine | Admitting: Emergency Medicine

## 2024-08-25 ENCOUNTER — Emergency Department (HOSPITAL_COMMUNITY)

## 2024-08-25 ENCOUNTER — Encounter (HOSPITAL_COMMUNITY): Payer: Self-pay

## 2024-08-25 ENCOUNTER — Other Ambulatory Visit: Payer: Self-pay

## 2024-08-25 DIAGNOSIS — Z8542 Personal history of malignant neoplasm of other parts of uterus: Secondary | ICD-10-CM | POA: Diagnosis not present

## 2024-08-25 DIAGNOSIS — Z8673 Personal history of transient ischemic attack (TIA), and cerebral infarction without residual deficits: Secondary | ICD-10-CM | POA: Insufficient documentation

## 2024-08-25 DIAGNOSIS — I10 Essential (primary) hypertension: Secondary | ICD-10-CM | POA: Diagnosis not present

## 2024-08-25 DIAGNOSIS — Z79899 Other long term (current) drug therapy: Secondary | ICD-10-CM | POA: Diagnosis not present

## 2024-08-25 DIAGNOSIS — I4891 Unspecified atrial fibrillation: Secondary | ICD-10-CM | POA: Insufficient documentation

## 2024-08-25 DIAGNOSIS — R009 Unspecified abnormalities of heart beat: Secondary | ICD-10-CM | POA: Diagnosis present

## 2024-08-25 DIAGNOSIS — Z7901 Long term (current) use of anticoagulants: Secondary | ICD-10-CM | POA: Diagnosis not present

## 2024-08-25 DIAGNOSIS — I700 Atherosclerosis of aorta: Secondary | ICD-10-CM | POA: Diagnosis not present

## 2024-08-25 LAB — CBC
HCT: 43.3 % (ref 36.0–46.0)
Hemoglobin: 14.4 g/dL (ref 12.0–15.0)
MCH: 33.1 pg (ref 26.0–34.0)
MCHC: 33.3 g/dL (ref 30.0–36.0)
MCV: 99.5 fL (ref 80.0–100.0)
Platelets: 187 K/uL (ref 150–400)
RBC: 4.35 MIL/uL (ref 3.87–5.11)
RDW: 12.1 % (ref 11.5–15.5)
WBC: 8 K/uL (ref 4.0–10.5)
nRBC: 0 % (ref 0.0–0.2)

## 2024-08-25 LAB — BASIC METABOLIC PANEL WITH GFR
Anion gap: 11 (ref 5–15)
BUN: 13 mg/dL (ref 8–23)
CO2: 27 mmol/L (ref 22–32)
Calcium: 9.7 mg/dL (ref 8.9–10.3)
Chloride: 104 mmol/L (ref 98–111)
Creatinine, Ser: 0.91 mg/dL (ref 0.44–1.00)
GFR, Estimated: >60 mL/min (ref >60)
Glucose, Bld: 91 mg/dL (ref 70–99)
Potassium: 4.1 mmol/L (ref 3.5–5.1)
Sodium: 142 mmol/L (ref 135–145)

## 2024-08-25 LAB — TROPONIN T, HIGH SENSITIVITY
Troponin T High Sensitivity: <15 ng/L (ref 0–19)
Troponin T High Sensitivity: <15 ng/L (ref 0–19)

## 2024-08-25 MED ORDER — METOPROLOL SUCCINATE ER 25 MG PO TB24: 25.0000 mg | ORAL_TABLET | Freq: Every day | ORAL | 3 refills | Status: DC

## 2024-08-25 NOTE — ED Notes (Signed)
 Pt had iv to right ac not documented. Iv taken out with cath intact, bleeding controlled. Nad. Site clean and dry

## 2024-08-25 NOTE — ED Triage Notes (Signed)
 Pt c/o fast heart rate this morning around 0600. Pt reports hx of a-fib. Pt reports she takes Xerelto. Pt denies any pain or SHOB at this time.

## 2024-08-25 NOTE — Discharge Instructions (Addendum)
 It was a pleasure caring for you today in the emergency department.  Please follow back up with A-fib clinic for further evaluation of your medications.  In the interim you can increase your Toprol -XL from 12.5 mg to 25 mg nightly.  Please return to the emergency department for any worsening or worrisome symptoms.

## 2024-08-25 NOTE — ED Provider Notes (Signed)
 [place] EMERGENCY DEPARTMENT AT [facility] Provider Note  CSN: [id number] Arrival date & time: 08/25/24 1126  Chief Complaint(s) Irregular Heart Beat  HPI Melissa [name] is a 73 y.o. female with past medical history as below, significant for fibrillation on Xarelto , CVA, hypertension, osteoarthritis who presents to the ED with complaint of A-fib  Patient follows with Dr. Inocencio, she had prior ablation (02/25/24), she was previously on flecainide  and then amiodarone .  She reports that over the past few days she has been having intermittent runs of A-fib that would resolve after an hour or so.  She reports this morning she began having A-fib around 6 AM and has persisted since then.  No chest pain, palpitations or dyspnea.  She is compliant with regular medications including Xarelto .  Is now taking Toprol -XL 12.5 mg nightly, hydralazine  10 mg 3 times daily, amlodipine  7.5 mg daily no longer on amiodarone  or flecainide   Past Medical History Past Medical History:  Diagnosis Date   Atrial fibrillation (HCC)    CVA (cerebral infarction) 11/2013   aphasia   Hypertension    Hyperthyroidism    remote   Uterine cancer [facility])    Patient Active Problem List   Diagnosis Date Noted   Anxiety 08/05/2023   Syncope 09/13/2021   Paroxysmal atrial fibrillation (HCC) 05/08/2019   Hyponatremia 02/08/2019   Atrial fibrillation (HCC) 02/08/2019   Palpitations    Atrial fibrillation with RVR (HCC) 08/31/2017   Hypertensive urgency 08/31/2017   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 Authorizing Provider  acetaminophen  (TYLENOL ) 500 MG tablet Take 1,000 mg by mouth every 6 (six) hours as needed (pain).    [provider]  ALPRAZolam  (XANAX ) 0.25 MG tablet Take 1 tablet (0.25 mg total) by mouth 3 (three) times daily as needed for anxiety or sleep. 02/05/19   Maree, Pratik D, DO  amLODipine  (NORVASC ) 5 MG tablet Take 7.5 mg by mouth daily. 08/20/23   [provider]  hydrALAZINE  (APRESOLINE ) 10 MG tablet Take 1 tablet (10 mg total) by mouth 3 (three) times daily. 07/12/23   Camnitz, Soyla Lunger, MD  metoprolol  succinate (TOPROL  XL) 25 MG 24 hr tablet Take 1 tablet (25 mg total) by mouth at bedtime. 08/25/24   Elnor Jayson LABOR, DO  Multiple Vitamin (MULTIVITAMIN WITH MINERALS) TABS tablet Take 1 tablet by mouth daily.    [provider]  olmesartan  (BENICAR ) 40 MG tablet Take 40 mg by mouth at bedtime. 12/01/18   [provider]  rivaroxaban  (XARELTO ) 20 MG TABS tablet Take 1 tablet (20 mg total) by mouth daily with supper. 02/01/19   Herlinda Milling, PA-C  Past Surgical History Past Surgical History:  Procedure Laterality Date   ABDOMINAL HYSTERECTOMY     ATRIAL FIBRILLATION ABLATION N/A 05/08/2019   Procedure: ATRIAL FIBRILLATION ABLATION;  Surgeon: Inocencio Soyla Lunger, MD;  Location: [facility] INVASIVE CV LAB;  Service: Cardiovascular;  Laterality: N/A;   ATRIAL FIBRILLATION ABLATION N/A 02/05/2024   Procedure: ATRIAL FIBRILLATION ABLATION;  Surgeon: Inocencio Soyla Lunger, MD;  Location: [facility] INVASIVE CV LAB;  Service: Cardiovascular;  Laterality: N/A;   CHOLECYSTECTOMY     COLONOSCOPY N/A 05/12/2014   Procedure: COLONOSCOPY;  Surgeon: Lamar CHRISTELLA Hollingshead, MD;  Location: [facility] ENDO SUITE;  Service: Endoscopy;  Laterality: N/A;  1130    COLONOSCOPY N/A 07/26/2017   Procedure: COLONOSCOPY;  Surgeon: Hollingshead Lamar CHRISTELLA, MD;  Location: [facility] ENDO SUITE;  Service: Endoscopy;  Laterality: N/A;  1:00 pm   INSERTION, STENT, DRUG-ELUTING, LACRIMAL CANALICULUS, WITH PUNCTAL DILATION Right 01/03/2024   Procedure: INSERTION, STENT, DRUG-ELUTING, LACRIMAL CANALICULUS, WITH PUNCTAL DILATION;  Surgeon: Harrie Agent, MD;  Location: [facility] ORS;  Service: Ophthalmology;  Laterality: Right;   REPLACEMENT TOTAL KNEE BILATERAL     bilateral knees replaced twice   Family History Family History  Problem Relation Age of Onset   CVA Maternal Grandfather 89   Colon cancer Neg Hx     Social History Social History   Tobacco Use   Smoking status: Never   Smokeless tobacco: Never   Tobacco comments:    Never smoked 03/04/24  Vaping Use   Vaping status: Never Used  Substance Use Topics   Alcohol use: No    Alcohol/week: 0.0 standard drinks of alcohol   Drug use: No   Allergies Chloromycetin [chloramphenicol]  Review of Systems A thorough review of systems was obtained and all systems are negative except as noted in the HPI and PMH.   Physical Exam Vital Signs  I have reviewed the triage vital signs BP 120/64   Pulse (!) 50   Temp 98.1 F (36.7 C) (Oral)   Resp 14   Ht 5' 8 (1.727 m)   Wt 77.1 kg   SpO2 100%   BMI 25.85 kg/m  Physical Exam Vitals and nursing note reviewed.  Constitutional:      General: She is not in acute distress.    Appearance: Normal appearance.  HENT:     Head: Normocephalic and atraumatic.     Right Ear: External ear normal.     Left Ear: External ear normal.     Nose: Nose normal.     Mouth/Throat:     Mouth: Mucous membranes are moist.  Eyes:     General: No scleral icterus.       Right eye: No discharge.        Left eye: No discharge.  Cardiovascular:     Rate and Rhythm: Normal rate. Rhythm irregular.     Pulses: Normal pulses.     Heart sounds: Normal heart sounds.  Pulmonary:     Effort:  Pulmonary effort is normal. No respiratory distress.     Breath sounds: Normal breath sounds. No stridor.  Abdominal:     General: Abdomen is flat. There is no distension.     Palpations: Abdomen is soft.     Tenderness: There is no abdominal tenderness.  Musculoskeletal:     Cervical back: No rigidity.     Right lower leg: No edema.     Left lower leg: No edema.  Skin:    General: Skin is warm and dry.  Capillary Refill: Capillary refill takes less than 2 seconds.  Neurological:     Mental Status: She is alert.  Psychiatric:        Mood and Affect: Mood normal.        Behavior: Behavior normal. Behavior is cooperative.     ED Results and Treatments Labs (all labs ordered are listed, but only abnormal results are displayed) Labs Reviewed  BASIC METABOLIC PANEL WITH GFR  CBC  TROPONIN T, HIGH SENSITIVITY  TROPONIN T, HIGH SENSITIVITY                                                                                                                          Radiology No results found.   Pertinent labs & imaging results that were available during my care of the patient were reviewed by me and considered in my medical decision making (see MDM for details).  Medications Ordered in ED Medications - No data to display                                                                                                                                   Procedures Procedures  (including critical care time)  Medical Decision Making / ED Course    Medical Decision Making:    PATRISHA HAUSMANN is a 73 y.o. female with past medical history as below, significant for fibrillation on Xarelto , CVA, hypertension, osteoarthritis who presents to the ED with complaint of A-fib. The complaint involves an extensive differential diagnosis and also carries with it a high risk of complications and morbidity.  Serious etiology was considered. Ddx includes but is not limited to: Electrolyte  derangement, ACS, atrial fibrillation, etc.  Complete initial physical exam performed, notably the patient was in acute distress.    Reviewed and confirmed nursing documentation for past medical history, family history, social history.  Vital signs reviewed.    Atrial fibrillation> - She is anticoagulated on Xarelto , she had prior ablation back in May with Dr. Inocencio.  Was previously on flecainide  and amiodarone  but has since been discontinued. Is now taking Toprol -XL 12.5 mg nightly, hydralazine  10 mg 3 times daily, amlodipine  7.5 mg daily no longer on amiodarone  or flecainide .  Last saw cardiology on 05/06/2024 - Labs here are reassuring, she has no chest pain or dyspnea.  Has a fluttering sensation intermittently but not currently.  She is currently rate controlled A-fib is  in the 60s to 80s. - Will discuss with cardiology to see if they would like to adjust her rate/rhythm control medications - back in NSR  Clinical Course as of 08/27/24 0806  Tue Aug 25, 2024  1421 Spoke with Dr. Debera, recommend follow-up in A-fib clinic.  Recommend increasing metoprolol  from 12.5 to 25 mg [SG]    Clinical Course User Index [SG] Elnor Jayson LABOR, DO      I have discussed the diagnosis/risks/treatment options with the patient and family.  Evaluation and diagnostic testing in the emergency department does not suggest an emergent condition requiring admission or immediate intervention beyond what has been performed at this time.  They will follow up with cardiology/afib clinc, pcp. We also discussed returning to the ED immediately if new or worsening sx occur. We discussed the sx which are most concerning (e.g., sudden worsening pain, fever, inability to tolerate by mouth) that necessitate immediate return.    The patient appears reasonably screened and/or stabilized for discharge and I doubt any other medical condition or other [facility] requiring further screening, evaluation, or treatment in the ED at this  time prior to discharge.                 Additional history obtained: -Additional history obtained from family -External records from outside source obtained and reviewed including: Chart review including previous notes, labs, imaging, consultation notes including  Prior cardiology documentation   Lab Tests: -I ordered, reviewed, and interpreted labs.   The pertinent results include:   Labs Reviewed  BASIC METABOLIC PANEL WITH GFR  CBC  TROPONIN T, HIGH SENSITIVITY  TROPONIN T, HIGH SENSITIVITY    Notable for labs stable  EKG   EKG Interpretation Date/Time:  Tuesday August 25 2024 14:15:37 EST Ventricular Rate:  64 PR Interval:  75 QRS Duration:  112 QT Interval:  429 QTC Calculation: 443 R Axis:   39  Text Interpretation: Sinus or ectopic atrial rhythm Atrial premature complex Short PR interval Borderline low voltage, extremity leads Confirmed by Cottie Cough [id number]) on 08/26/2024 8:18:57 AM         Imaging Studies ordered: I ordered imaging studies including CXR I independently visualized the following imaging with scope of interpretation limited to determining acute life threatening conditions related to emergency care; findings noted above I agree with the radiologist interpretation If any imaging was obtained with contrast I closely monitored patient for any possible adverse reaction a/w contrast administration in the emergency department   Medicines ordered and prescription drug management: Meds ordered this encounter  Medications   metoprolol  succinate (TOPROL  XL) 25 MG 24 hr tablet    Sig: Take 1 tablet (25 mg total) by mouth at bedtime.    Dispense:  45 tablet    Refill:  3    -I have reviewed the patients home medicines and have made adjustments as needed   Consultations Obtained: I requested consultation with the cardiology,  and discussed lab and imaging findings as well as pertinent plan - they recommend: f/u afib clinic, increase  toprol    Cardiac Monitoring: The patient was maintained on a cardiac monitor.  I personally viewed and interpreted the cardiac monitored which showed an underlying rhythm of: afib > nsr Continuous pulse oximetry interpreted by myself, 100% on RA.    Social Determinants of Health:  Diagnosis or treatment significantly limited by social determinants of health: na   Reevaluation: After the interventions noted above, I reevaluated the patient and found that they have  resolved  Co morbidities that complicate the patient evaluation  Past Medical History:  Diagnosis Date   Atrial fibrillation (HCC)    CVA (cerebral infarction) 11/2013   aphasia   Hypertension    Hyperthyroidism    remote   Uterine cancer (HCC)       Dispostion: Disposition decision including need for hospitalization was considered, and patient discharged from emergency department.    Final Clinical Impression(s) / ED Diagnoses Final diagnoses:  Atrial fibrillation, unspecified type (HCC)        Elnor Jayson LABOR, DO 08/27/24 9193

## 2024-08-25 NOTE — ED Notes (Signed)
 Pt to bathroom. Stating she is ready to go. Denies any symptoms at this time. EDP aware.

## 2024-09-08 NOTE — Progress Notes (Unsigned)
 All Electrophysiology Office Note:   Date:  09/08/2024  ID:  Chapel, Melissa [name], Melissa [name], MRN [id number]  Primary Cardiologist: None Primary Heart Failure: None Electrophysiologist: Toma Arts Gladis Norton, MD  {Click to update primary MD,subspecialty MD or APP then REFRESH:1}    History of Present Illness:   Melissa [name] is a 73 y.o. female with h/o hypertension, CVA, atrial fibrillation, hypothyroidism seen today for routine electrophysiology followup.   Since last being seen in our clinic the patient reports doing ***.  she denies chest pain, palpitations, dyspnea, PND, orthopnea, nausea, vomiting, dizziness, syncope, edema, weight gain, or early satiety.   Review of systems complete and found to be negative unless listed in HPI.   EP Information / Studies Reviewed:    {EKGtoday:28818}        Risk Assessment/Calculations:    CHA2DS2-VASc Score = 5  {Confirm score is correct.  If not, click here to update score.  REFRESH note.  :1} This indicates a 7.2% annual risk of stroke. The patient's score is based upon: CHF History: 0 HTN History: 1 Diabetes History: 0 Stroke History: 2 Vascular Disease History: 0 Age Score: 1 Gender Score: 1   {This patient has a significant risk of stroke if diagnosed with atrial fibrillation.  Please consider VKA or DOAC agent for anticoagulation if the bleeding risk is acceptable.   You can also use the SmartPhrase .[email or web address] for documentation.   :[id number]}        Physical Exam:   VS:  There were no vitals taken for this visit.   Wt Readings from Last 3 Encounters:  08/25/24 170 lb (77.1 kg)  05/06/24 178 lb (80.7 kg)  03/04/24 173 lb (78.5 kg)     GEN: Well nourished, well developed in no acute distress NECK: No JVD; No carotid bruits CARDIAC: {EPRHYTHM:28826}, no murmurs, rubs, gallops RESPIRATORY:  Clear to auscultation without rales, wheezing or rhonchi  ABDOMEN: Soft, non-tender, non-distended EXTREMITIES:  No edema; No  deformity   ASSESSMENT AND PLAN:    1.  Paroxysmal atrial fibrillation: Post ablation 05/08/2019 with repeat ablation 02/05/2024.  Was on amiodarone  but this has been stopped.***  2.  Hypertension:***  3.  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

## 2024-09-09 ENCOUNTER — Encounter: Payer: Self-pay | Admitting: Cardiology

## 2024-09-09 ENCOUNTER — Ambulatory Visit: Attending: Cardiology | Admitting: Cardiology

## 2024-09-09 VITALS — BP 189/77 | HR 70 | Ht 68.5 in | Wt 187.0 lb

## 2024-09-09 DIAGNOSIS — I10 Essential (primary) hypertension: Secondary | ICD-10-CM

## 2024-09-09 DIAGNOSIS — I480 Paroxysmal atrial fibrillation: Secondary | ICD-10-CM | POA: Diagnosis not present

## 2024-09-09 DIAGNOSIS — D6869 Other thrombophilia: Secondary | ICD-10-CM | POA: Diagnosis not present

## 2024-09-09 MED ORDER — FLECAINIDE ACETATE 50 MG PO TABS: 50.0000 mg | ORAL_TABLET | Freq: Two times a day (BID) | ORAL | 2 refills | Status: AC

## 2024-09-09 NOTE — Addendum Note (Signed)
 Addended by: GRETEL MAEOLA CROME on: 09/09/2024 12:25 PM   Modules accepted: Orders

## 2024-09-09 NOTE — Patient Instructions (Signed)
 Medication Instructions:  Your physician has recommended you make the following change in your medication:  START Flecainide  50 mg twice a day  *If you need a refill on your cardiac medications before your next appointment, please call your pharmacy*  Lab Work: None ordered  If you have any lab test that is abnormal or we need to change your treatment, we will call you to review the results.  Testing/Procedures: None ordered  Follow-Up: At [facility], you and your health needs are our priority.  As part of our continuing mission to provide you with exceptional heart care, our providers are all part of one team.  This team includes your primary Cardiologist (physician) and Advanced Practice Providers or APPs (Physician Assistants and Nurse Practitioners) who all work together to provide you with the care you need, when you need it.  Your next appointment:   2 week(s)  Provider:   Nurse visit EKG in [place]     Your physician recommends that you schedule a follow-up appointment in: 3 months in the Afib clinic  Thank you for choosing [facility]!!   Maeola Domino, RN [phone]   Other Instructions  Flecainide  Tablets What is this medication? FLECAINIDE  (FLEK a nide) prevents and treats a fast or irregular heartbeat (arrhythmia). It is often used to treat a type of arrhythmia known as AFib (atrial fibrillation). It works by slowing down overactive electric signals in the heart, which stabilizes your heart rhythm. It belongs to a group of medications called antiarrhythmics. This medicine may be used for other purposes; ask your health care provider or pharmacist if you have questions. COMMON BRAND NAME(S): Tambocor  What should I tell my care team before I take this medication? They need to know if you have any of these conditions: High or low levels of potassium in the blood Heart disease including heart rhythm and heart rate problems Kidney disease Liver  disease Recent heart attack An unusual or allergic reaction to flecainide , other medications, foods, dyes, or preservatives Pregnant or trying to get pregnant Breastfeeding How should I use this medication? Take this medication by mouth with a glass of water . Take it as directed on the prescription label at the same time every day. You can take it with or without food. If it upsets your stomach, take it with food. Do not take your medication more often than directed. Do not stop taking this medication suddenly. This may cause serious, heart-related side effects. If your care team wants you to stop the medication, the dose may be slowly lowered over time to avoid any side effects. Talk to your care team about the use of this medication in children. While it may be prescribed for children as young as 1 year for selected conditions, precautions do apply. Overdosage: If you think you have taken too much of this medicine contact a poison control center or emergency room at once. NOTE: This medicine is only for you. Do not share this medicine with others. What if I miss a dose? If you miss a dose, take it as soon as you can. If it is almost time for your next dose, take only that dose. Do not take double or extra doses. What may interact with this medication? Do not take this medication with any of the following: Amoxapine Arsenic trioxide Certain antibiotics, such as clarithromycin, erythromycin, gatifloxacin, gemifloxacin, levofloxacin, moxifloxacin , sparfloxacin, or troleandomycin Certain antidepressants, called tricyclic antidepressants such as amitriptyline, imipramine, or nortriptyline Certain medications for irregular heartbeat, such  as disopyramide, encainide, moricizine, procainamide, propafenone, and quinidine Cisapride Delavirdine Droperidol Haloperidol [place] Imatinib Levomethadyl Maprotiline Medications for malaria, such as chloroquine and  halofantrine Pentamidine Phenothiazines, such as chlorpromazine, mesoridazine, prochlorperazine, thioridazine Pimozide Quinine Ranolazine Ritonavir Sertindole This medication may also interact with the following: Cimetidine Dofetilide Medications for angina or blood pressure Medications for irregular heartbeat, such as amiodarone  and digoxin Ziprasidone This list may not describe all possible interactions. Give your health care provider a list of all the medicines, herbs, non-prescription drugs, or dietary supplements you use. Also tell them if you smoke, drink alcohol, or use illegal drugs. Some items may interact with your medicine. What should I watch for while using this medication? Visit your care team for regular checks on your progress. Because your condition and the use of this medication carries some risk, it is a good idea to carry an identification card, necklace, or bracelet with details of your condition, medications, and care team. Check your blood pressure and pulse rate as directed. Know what your blood pressure and pulse rate should be and when tod contact your care team. Your care team may schedule regular blood tests and electrocardiograms to check your progress. This medication may affect your coordination, reaction time, or judgment. Do not drive or operate machinery until you know how this medication affects you. Sit up or stand slowly to reduce the risk of dizzy or fainting spells. Drinking alcohol with this medication can increase the risk of these side effects. What side effects may I notice from receiving this medication? Side effects that you should report to your care team as soon as possible: Allergic reactions--skin rash, itching, hives, swelling of the face, lips, tongue, or throat Heart failure--shortness of breath, swelling of the ankles, feet, or hands, sudden weight gain, unusual weakness or fatigue Heart rhythm changes--fast or irregular heartbeat,  dizziness, feeling faint or lightheaded, chest pain, trouble breathing Liver injury--right upper belly pain, loss of appetite, nausea, light-colored stool, dark yellow or brown urine, yellowing skin or eyes, unusual weakness or fatigue Side effects that usually do not require medical attention (report to your care team if they continue or are bothersome): Blurry vision Constipation Dizziness Fatigue Headache Nausea Tremors or shaking This list may not describe all possible side effects. Call your doctor for medical advice about side effects. You may report side effects to FDA at 1-800-FDA-1088. Where should I keep my medication? Keep out of the reach of children and pets. Store at room temperature between 15 and 30 degrees C (59 and 86 degrees F). Protect from light. Keep container tightly closed. Throw away any unused medication after the expiration date. NOTE: This sheet is a summary. It may not cover all possible information. If you have questions about this medicine, talk to your doctor, pharmacist, or health care provider.  2024 Elsevier/Gold Standard (2022-04-25 00:00:00)

## 2024-09-28 ENCOUNTER — Ambulatory Visit: Attending: Internal Medicine

## 2024-09-28 VITALS — BP 170/72 | HR 60

## 2024-09-28 DIAGNOSIS — I480 Paroxysmal atrial fibrillation: Secondary | ICD-10-CM

## 2024-09-28 NOTE — Patient Instructions (Signed)
 I will forward your EKG to Dr.Camnitz to review.  His office will call you for any changes to care plan.   Thank you for choosing [facility] !

## 2024-09-28 NOTE — Progress Notes (Signed)
 Nurse visit for EKG after starting Flecainide  50 mg twice a day on 09/09/24 by Dr.Camnitz.

## 2024-10-06 ENCOUNTER — Other Ambulatory Visit (HOSPITAL_COMMUNITY): Payer: Self-pay | Admitting: Internal Medicine

## 2024-10-06 DIAGNOSIS — Z1231 Encounter for screening mammogram for malignant neoplasm of breast: Secondary | ICD-10-CM

## 2024-10-19 ENCOUNTER — Other Ambulatory Visit: Payer: Self-pay | Admitting: Physician Assistant

## 2024-10-28 ENCOUNTER — Encounter (HOSPITAL_COMMUNITY): Payer: Self-pay

## 2024-10-28 ENCOUNTER — Ambulatory Visit (HOSPITAL_COMMUNITY)
Admission: RE | Admit: 2024-10-28 | Discharge: 2024-10-28 | Disposition: A | Discharge status: Home / Self Care | Source: Ambulatory Visit | Attending: Internal Medicine | Admitting: Internal Medicine

## 2024-10-28 DIAGNOSIS — Z1231 Encounter for screening mammogram for malignant neoplasm of breast: Secondary | ICD-10-CM | POA: Diagnosis present

## 2024-10-29 ENCOUNTER — Other Ambulatory Visit (HOSPITAL_COMMUNITY): Payer: Self-pay | Admitting: Internal Medicine

## 2024-10-29 DIAGNOSIS — N1831 Chronic kidney disease, stage 3a: Secondary | ICD-10-CM

## 2024-11-09 ENCOUNTER — Ambulatory Visit (HOSPITAL_COMMUNITY)

## 2024-12-09 ENCOUNTER — Ambulatory Visit (HOSPITAL_COMMUNITY): Admitting: Nurse Practitioner
